# Patient Record
Sex: Male | Born: 1940
Health system: Southern US, Community
[De-identification: ages and names within clinical notes are randomized; demographics above are authoritative.]

## PROBLEM LIST (undated history)

## (undated) DIAGNOSIS — N200 Calculus of kidney: Secondary | ICD-10-CM

## (undated) DIAGNOSIS — M1990 Unspecified osteoarthritis, unspecified site: Secondary | ICD-10-CM

## (undated) DIAGNOSIS — M75101 Unspecified rotator cuff tear or rupture of right shoulder, not specified as traumatic: Secondary | ICD-10-CM

## (undated) DIAGNOSIS — I10 Essential (primary) hypertension: Secondary | ICD-10-CM

## (undated) DIAGNOSIS — I2510 Atherosclerotic heart disease of native coronary artery without angina pectoris: Secondary | ICD-10-CM

## (undated) DIAGNOSIS — Z87442 Personal history of urinary calculi: Secondary | ICD-10-CM

## (undated) DIAGNOSIS — C434 Malignant melanoma of scalp and neck: Secondary | ICD-10-CM

## (undated) DIAGNOSIS — R7303 Prediabetes: Secondary | ICD-10-CM

## (undated) DIAGNOSIS — Z8669 Personal history of other diseases of the nervous system and sense organs: Secondary | ICD-10-CM

## (undated) DIAGNOSIS — C44310 Basal cell carcinoma of skin of unspecified parts of face: Secondary | ICD-10-CM

## (undated) DIAGNOSIS — Z860101 Personal history of adenomatous and serrated colon polyps: Secondary | ICD-10-CM

## (undated) DIAGNOSIS — E785 Hyperlipidemia, unspecified: Secondary | ICD-10-CM

## (undated) DIAGNOSIS — G43909 Migraine, unspecified, not intractable, without status migrainosus: Secondary | ICD-10-CM

## (undated) DIAGNOSIS — C61 Malignant neoplasm of prostate: Secondary | ICD-10-CM

## (undated) DIAGNOSIS — Z973 Presence of spectacles and contact lenses: Secondary | ICD-10-CM

## (undated) DIAGNOSIS — Z972 Presence of dental prosthetic device (complete) (partial): Secondary | ICD-10-CM

## (undated) DIAGNOSIS — I219 Acute myocardial infarction, unspecified: Secondary | ICD-10-CM

## (undated) DIAGNOSIS — Z86010 Personal history of colonic polyps: Secondary | ICD-10-CM

## (undated) HISTORY — PX: CIRCUMCISION: SUR203

## (undated) HISTORY — PX: COLONOSCOPY: SHX174

## (undated) HISTORY — DX: Personal history of colonic polyps: Z86.010

## (undated) HISTORY — DX: Prediabetes: R73.03

## (undated) HISTORY — PX: HEMORRHOID SURGERY: SHX153

## (undated) HISTORY — DX: Personal history of adenomatous and serrated colon polyps: Z86.0101

---
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
## 1998-07-22 DIAGNOSIS — I219 Acute myocardial infarction, unspecified: Secondary | ICD-10-CM

## 1998-07-22 HISTORY — DX: Acute myocardial infarction, unspecified: I21.9

## 1998-07-22 HISTORY — PX: CORONARY ANGIOPLASTY WITH STENT PLACEMENT: SHX49

## 1999-07-23 HISTORY — PX: CORONARY ANGIOPLASTY WITH STENT PLACEMENT: SHX49

## 2009-07-22 HISTORY — PX: CATARACT EXTRACTION W/ INTRAOCULAR LENS  IMPLANT, BILATERAL: SHX1307

## 2010-04-03 ENCOUNTER — Ambulatory Visit: Payer: Self-pay | Admitting: Ophthalmology

## 2010-07-22 HISTORY — PX: COLONOSCOPY: SHX174

## 2010-09-25 ENCOUNTER — Ambulatory Visit: Payer: Self-pay | Admitting: Ophthalmology

## 2011-08-20 DIAGNOSIS — D485 Neoplasm of uncertain behavior of skin: Secondary | ICD-10-CM | POA: Diagnosis not present

## 2011-09-25 DIAGNOSIS — D485 Neoplasm of uncertain behavior of skin: Secondary | ICD-10-CM | POA: Diagnosis not present

## 2011-09-25 DIAGNOSIS — C44319 Basal cell carcinoma of skin of other parts of face: Secondary | ICD-10-CM | POA: Diagnosis not present

## 2011-09-25 DIAGNOSIS — L570 Actinic keratosis: Secondary | ICD-10-CM | POA: Diagnosis not present

## 2011-11-18 DIAGNOSIS — E785 Hyperlipidemia, unspecified: Secondary | ICD-10-CM | POA: Diagnosis not present

## 2011-11-18 DIAGNOSIS — Z79899 Other long term (current) drug therapy: Secondary | ICD-10-CM | POA: Diagnosis not present

## 2011-11-20 DIAGNOSIS — E7800 Pure hypercholesterolemia, unspecified: Secondary | ICD-10-CM | POA: Diagnosis not present

## 2011-11-20 DIAGNOSIS — R7309 Other abnormal glucose: Secondary | ICD-10-CM | POA: Diagnosis not present

## 2011-11-20 DIAGNOSIS — Z0289 Encounter for other administrative examinations: Secondary | ICD-10-CM | POA: Diagnosis not present

## 2011-11-20 DIAGNOSIS — I10 Essential (primary) hypertension: Secondary | ICD-10-CM | POA: Diagnosis not present

## 2012-01-08 DIAGNOSIS — Z85828 Personal history of other malignant neoplasm of skin: Secondary | ICD-10-CM | POA: Diagnosis not present

## 2012-01-08 DIAGNOSIS — L570 Actinic keratosis: Secondary | ICD-10-CM | POA: Diagnosis not present

## 2012-01-08 DIAGNOSIS — D485 Neoplasm of uncertain behavior of skin: Secondary | ICD-10-CM | POA: Diagnosis not present

## 2012-05-27 DIAGNOSIS — Z79899 Other long term (current) drug therapy: Secondary | ICD-10-CM | POA: Diagnosis not present

## 2012-05-27 DIAGNOSIS — Z125 Encounter for screening for malignant neoplasm of prostate: Secondary | ICD-10-CM | POA: Diagnosis not present

## 2012-05-27 DIAGNOSIS — I10 Essential (primary) hypertension: Secondary | ICD-10-CM | POA: Diagnosis not present

## 2012-05-27 DIAGNOSIS — R7309 Other abnormal glucose: Secondary | ICD-10-CM | POA: Diagnosis not present

## 2012-05-27 DIAGNOSIS — E7800 Pure hypercholesterolemia, unspecified: Secondary | ICD-10-CM | POA: Diagnosis not present

## 2012-05-27 DIAGNOSIS — H43819 Vitreous degeneration, unspecified eye: Secondary | ICD-10-CM | POA: Diagnosis not present

## 2012-05-27 DIAGNOSIS — Z23 Encounter for immunization: Secondary | ICD-10-CM | POA: Diagnosis not present

## 2012-06-29 DIAGNOSIS — L989 Disorder of the skin and subcutaneous tissue, unspecified: Secondary | ICD-10-CM | POA: Diagnosis not present

## 2012-06-30 DIAGNOSIS — D485 Neoplasm of uncertain behavior of skin: Secondary | ICD-10-CM | POA: Diagnosis not present

## 2012-06-30 DIAGNOSIS — C434 Malignant melanoma of scalp and neck: Secondary | ICD-10-CM | POA: Diagnosis not present

## 2012-07-08 DIAGNOSIS — C434 Malignant melanoma of scalp and neck: Secondary | ICD-10-CM | POA: Diagnosis not present

## 2012-07-08 DIAGNOSIS — L570 Actinic keratosis: Secondary | ICD-10-CM | POA: Diagnosis not present

## 2012-07-10 ENCOUNTER — Other Ambulatory Visit (HOSPITAL_COMMUNITY): Payer: Self-pay | Admitting: Otolaryngology

## 2012-07-10 ENCOUNTER — Other Ambulatory Visit: Payer: Self-pay

## 2012-07-10 ENCOUNTER — Encounter (HOSPITAL_BASED_OUTPATIENT_CLINIC_OR_DEPARTMENT_OTHER)
Admission: RE | Admit: 2012-07-10 | Discharge: 2012-07-10 | Disposition: A | Discharge status: Home / Self Care | Payer: Medicare Other | Source: Ambulatory Visit | Attending: Otolaryngology | Admitting: Otolaryngology

## 2012-07-10 ENCOUNTER — Encounter (HOSPITAL_BASED_OUTPATIENT_CLINIC_OR_DEPARTMENT_OTHER): Payer: Self-pay | Admitting: *Deleted

## 2012-07-10 DIAGNOSIS — Z01810 Encounter for preprocedural cardiovascular examination: Secondary | ICD-10-CM | POA: Diagnosis not present

## 2012-07-10 DIAGNOSIS — I252 Old myocardial infarction: Secondary | ICD-10-CM | POA: Diagnosis not present

## 2012-07-10 DIAGNOSIS — C439 Malignant melanoma of skin, unspecified: Secondary | ICD-10-CM

## 2012-07-10 DIAGNOSIS — Z91013 Allergy to seafood: Secondary | ICD-10-CM | POA: Diagnosis not present

## 2012-07-10 DIAGNOSIS — Z9849 Cataract extraction status, unspecified eye: Secondary | ICD-10-CM | POA: Diagnosis not present

## 2012-07-10 DIAGNOSIS — C434 Malignant melanoma of scalp and neck: Secondary | ICD-10-CM | POA: Diagnosis not present

## 2012-07-10 DIAGNOSIS — M129 Arthropathy, unspecified: Secondary | ICD-10-CM | POA: Diagnosis not present

## 2012-07-10 DIAGNOSIS — I10 Essential (primary) hypertension: Secondary | ICD-10-CM | POA: Diagnosis not present

## 2012-07-10 DIAGNOSIS — Z7982 Long term (current) use of aspirin: Secondary | ICD-10-CM | POA: Diagnosis not present

## 2012-07-10 DIAGNOSIS — E785 Hyperlipidemia, unspecified: Secondary | ICD-10-CM | POA: Diagnosis not present

## 2012-07-10 DIAGNOSIS — I2510 Atherosclerotic heart disease of native coronary artery without angina pectoris: Secondary | ICD-10-CM | POA: Diagnosis not present

## 2012-07-10 DIAGNOSIS — Z9861 Coronary angioplasty status: Secondary | ICD-10-CM | POA: Diagnosis not present

## 2012-07-10 DIAGNOSIS — Z01812 Encounter for preprocedural laboratory examination: Secondary | ICD-10-CM | POA: Diagnosis not present

## 2012-07-10 DIAGNOSIS — Z87891 Personal history of nicotine dependence: Secondary | ICD-10-CM | POA: Diagnosis not present

## 2012-07-10 LAB — BASIC METABOLIC PANEL
CO2: 26 mEq/L (ref 19–32)
Calcium: 10.1 mg/dL (ref 8.4–10.5)
Creatinine, Ser: 0.71 mg/dL (ref 0.50–1.35)
GFR calc Af Amer: >90 mL/min (ref >90)

## 2012-07-10 NOTE — Progress Notes (Signed)
Pt came from dr office Was told he will go to radiology hospital from short stay 630am dos then come here-wife will come here-he is to be transported-dr Ezzard Standing has arranged this Pt works full time at a [employer] in [place]. He had  a stent put in 2001 [place]-saw dr for 1 yr-has not had to see cardiology since 2002-dr hamrick is PCP-[place]-had dr fitzgerald see pt to see if he needs to see a cardiologist-he says no-he lifts hay bales and feed bags. No hospital adm for cp bmet and ekg done

## 2012-07-20 NOTE — Anesthesia Preprocedure Evaluation (Addendum)
Anesthesia Evaluation  Patient identified by MRN, date of birth, ID band Patient awake    Reviewed: Allergy & Precautions, H&P , NPO status , Patient's Chart, lab work & pertinent test results  Airway Mallampati: I TM Distance: >3 FB Neck ROM: Full    Dental   Pulmonary  breath sounds clear to auscultation        Cardiovascular hypertension, + CAD and + Past MI Rate:Normal     Neuro/Psych    GI/Hepatic   Endo/Other    Renal/GU      Musculoskeletal   Abdominal   Peds  Hematology   Anesthesia Other Findings   Reproductive/Obstetrics                          Anesthesia Physical Anesthesia Plan  ASA: III  Anesthesia Plan: General   Post-op Pain Management:    Induction: Intravenous  Airway Management Planned: Oral ETT  Additional Equipment:   Intra-op Plan:   Post-operative Plan: Extubation in OR  Informed Consent: I have reviewed the patients History and Physical, chart, labs and discussed the procedure including the risks, benefits and alternatives for the proposed anesthesia with the patient or authorized representative who has indicated his/her understanding and acceptance.     Plan Discussed with: CRNA and Surgeon  Anesthesia Plan Comments: (Prone position)       Anesthesia Quick Evaluation

## 2012-07-20 NOTE — H&P (Signed)
PREOPERATIVE H&P  Chief Complaint: melanoma of the neck  HPI: Kevin Stewart is a 71 y.o. male who presents for evaluation of melanoma of the posterior neck at the hairline. This was removed about 3 weeks ago measuring .8 cm in size with Breslow thickness of 3.8 mm. He's taken to the OR for wide reexcising and sentinel node biopsy/  Past Medical History  Diagnosis Date  . Hypertension   . Coronary artery disease 2001  . Myocardial infarction 2001  . Hyperlipemia   . Stented coronary artery   . Arthritis   . Wears glasses   . Wears dentures     upper-lower partial   Past Surgical History  Procedure Date  . Eye surgery 2011    both cataracts  . Cardiac catheterization 2001    stent  . Tonsillectomy   . Colonoscopy    History   Social History  . Marital Status: Married    Spouse Name: N/A    Number of Children: N/A  . Years of Education: N/A   Social History Main Topics  . Smoking status: Former Smoker    Quit date: 07/10/1973  . Smokeless tobacco: None  . Alcohol Use: No     Comment: not in 5 yr  . Drug Use: No  . Sexually Active:    Other Topics Concern  . None   Social History Narrative  . None   History reviewed. No pertinent family history. Allergies  Allergen Reactions  . Shrimp (Shellfish Allergy) Nausea And Vomiting   Prior to Admission medications   Medication Sig Start Date End Date Taking? Authorizing Provider  aspirin 81 MG tablet Take 81 mg by mouth daily.   Yes Historical Provider, MD  carvedilol (COREG) 12.5 MG tablet Take 12.5 mg by mouth every morning.   Yes Historical Provider, MD  clopidogrel (PLAVIX) 75 MG tablet Take 75 mg by mouth daily.   Yes Historical Provider, MD  enalapril-hydrochlorothiazide (VASERETIC) 10-25 MG per tablet Take 1 tablet by mouth daily. Takes half   Yes Historical Provider, MD  ezetimibe-simvastatin (VYTORIN) 10-40 MG per tablet Take 1 tablet by mouth at bedtime.   Yes Historical Provider, MD     Positive  ROS: negative  All other systems have been reviewed and were otherwise negative with the exception of those mentioned in the HPI and as above.  Physical Exam: There were no vitals filed for this visit.  General: Alert, no acute distress Oral: Normal oral mucosa and tonsils Nasal: Clear nasal passages Neck: No palpable adenopathy or thyroid nodules.Well healed neck excision posterior right neck Ear: Ear canal is clear with normal appearing TMs Cardiovascular: Regular rate and rhythm, no murmur.  Respiratory: Clear to auscultation Neurologic: Alert and oriented x 3   Assessment/Plan: right neck melanoma Plan for Procedure(s): MELANOMA EXCISION WITH SENTINEL LYMPH NODE BIOPSY   Dillard Cannon, MD 07/20/2012 5:17 PM

## 2012-07-21 ENCOUNTER — Encounter (HOSPITAL_BASED_OUTPATIENT_CLINIC_OR_DEPARTMENT_OTHER)
Admission: RE | Disposition: A | Discharge status: Home / Self Care | Payer: Self-pay | Source: Ambulatory Visit | Attending: Otolaryngology

## 2012-07-21 ENCOUNTER — Ambulatory Visit (HOSPITAL_BASED_OUTPATIENT_CLINIC_OR_DEPARTMENT_OTHER)
Admission: RE | Admit: 2012-07-21 | Discharge: 2012-07-21 | Disposition: A | Discharge status: Home / Self Care | Payer: Medicare Other | Source: Ambulatory Visit | Attending: Otolaryngology | Admitting: Otolaryngology

## 2012-07-21 ENCOUNTER — Encounter (HOSPITAL_COMMUNITY)
Admission: RE | Admit: 2012-07-21 | Discharge: 2012-07-21 | Disposition: A | Discharge status: Home / Self Care | Payer: Medicare Other | Source: Ambulatory Visit | Attending: Otolaryngology | Admitting: Otolaryngology

## 2012-07-21 ENCOUNTER — Encounter (HOSPITAL_BASED_OUTPATIENT_CLINIC_OR_DEPARTMENT_OTHER): Payer: Self-pay | Admitting: Anesthesiology

## 2012-07-21 ENCOUNTER — Encounter (HOSPITAL_BASED_OUTPATIENT_CLINIC_OR_DEPARTMENT_OTHER): Payer: Self-pay

## 2012-07-21 ENCOUNTER — Ambulatory Visit (HOSPITAL_BASED_OUTPATIENT_CLINIC_OR_DEPARTMENT_OTHER): Payer: Medicare Other | Admitting: Anesthesiology

## 2012-07-21 DIAGNOSIS — Z01810 Encounter for preprocedural cardiovascular examination: Secondary | ICD-10-CM | POA: Insufficient documentation

## 2012-07-21 DIAGNOSIS — I2510 Atherosclerotic heart disease of native coronary artery without angina pectoris: Secondary | ICD-10-CM | POA: Insufficient documentation

## 2012-07-21 DIAGNOSIS — C439 Malignant melanoma of skin, unspecified: Secondary | ICD-10-CM | POA: Insufficient documentation

## 2012-07-21 DIAGNOSIS — E785 Hyperlipidemia, unspecified: Secondary | ICD-10-CM | POA: Insufficient documentation

## 2012-07-21 DIAGNOSIS — Z7982 Long term (current) use of aspirin: Secondary | ICD-10-CM | POA: Insufficient documentation

## 2012-07-21 DIAGNOSIS — Z91013 Allergy to seafood: Secondary | ICD-10-CM | POA: Insufficient documentation

## 2012-07-21 DIAGNOSIS — I10 Essential (primary) hypertension: Secondary | ICD-10-CM | POA: Diagnosis not present

## 2012-07-21 DIAGNOSIS — Z9861 Coronary angioplasty status: Secondary | ICD-10-CM | POA: Insufficient documentation

## 2012-07-21 DIAGNOSIS — Z9849 Cataract extraction status, unspecified eye: Secondary | ICD-10-CM | POA: Insufficient documentation

## 2012-07-21 DIAGNOSIS — C434 Malignant melanoma of scalp and neck: Secondary | ICD-10-CM | POA: Insufficient documentation

## 2012-07-21 DIAGNOSIS — Z01812 Encounter for preprocedural laboratory examination: Secondary | ICD-10-CM | POA: Insufficient documentation

## 2012-07-21 DIAGNOSIS — C4441 Basal cell carcinoma of skin of scalp and neck: Secondary | ICD-10-CM | POA: Diagnosis not present

## 2012-07-21 DIAGNOSIS — Z87891 Personal history of nicotine dependence: Secondary | ICD-10-CM | POA: Insufficient documentation

## 2012-07-21 DIAGNOSIS — I252 Old myocardial infarction: Secondary | ICD-10-CM | POA: Diagnosis not present

## 2012-07-21 DIAGNOSIS — M129 Arthropathy, unspecified: Secondary | ICD-10-CM | POA: Insufficient documentation

## 2012-07-21 HISTORY — DX: Hyperlipidemia, unspecified: E78.5

## 2012-07-21 HISTORY — DX: Essential (primary) hypertension: I10

## 2012-07-21 HISTORY — DX: Presence of dental prosthetic device (complete) (partial): Z97.2

## 2012-07-21 HISTORY — DX: Acute myocardial infarction, unspecified: I21.9

## 2012-07-21 HISTORY — DX: Atherosclerotic heart disease of native coronary artery without angina pectoris: I25.10

## 2012-07-21 HISTORY — DX: Presence of spectacles and contact lenses: Z97.3

## 2012-07-21 HISTORY — PX: MELANOMA EXCISION WITH SENTINEL LYMPH NODE BIOPSY: SHX5267

## 2012-07-21 HISTORY — DX: Unspecified osteoarthritis, unspecified site: M19.90

## 2012-07-21 LAB — POCT I-STAT, CHEM 8
BUN: 18 mg/dL (ref 6–23)
Chloride: 104 mEq/L (ref 96–112)
Creatinine, Ser: 0.8 mg/dL (ref 0.50–1.35)
Potassium: 4.1 mEq/L (ref 3.5–5.1)
Sodium: 141 mEq/L (ref 135–145)

## 2012-07-21 SURGERY — MELANOMA EXCISION WITH SENTINEL LYMPH NODE BIOPSY
Anesthesia: General | Site: Neck | Wound class: Clean

## 2012-07-21 MED ORDER — TECHNETIUM TC 99M SULFUR COLLOID FILTERED
1.0000 | Freq: Once | INTRAVENOUS | Status: AC | PRN
  Administered 2012-07-21: 1 via INTRADERMAL

## 2012-07-21 MED ORDER — METHYLENE BLUE 1 % INJ SOLN
INTRAMUSCULAR | Status: DC | PRN
  Administered 2012-07-21: .2 mL via INTRADERMAL

## 2012-07-21 MED ORDER — ONDANSETRON HCL 4 MG/2ML IJ SOLN
INTRAMUSCULAR | Status: DC | PRN
  Administered 2012-07-21: 4 mg via INTRAVENOUS

## 2012-07-21 MED ORDER — ROCURONIUM BROMIDE 100 MG/10ML IV SOLN
INTRAVENOUS | Status: DC | PRN
  Administered 2012-07-21: 50 mg via INTRAVENOUS

## 2012-07-21 MED ORDER — BACITRACIN ZINC 500 UNIT/GM EX OINT
TOPICAL_OINTMENT | CUTANEOUS | Status: DC | PRN
  Administered 2012-07-21: 1 via TOPICAL

## 2012-07-21 MED ORDER — CEFAZOLIN SODIUM-DEXTROSE 2-3 GM-% IV SOLR
2.0000 g | Freq: Once | INTRAVENOUS | Status: AC
  Administered 2012-07-21: 2 g via INTRAVENOUS

## 2012-07-21 MED ORDER — LIDOCAINE-EPINEPHRINE 1 %-1:100000 IJ SOLN
INTRAMUSCULAR | Status: DC | PRN
  Administered 2012-07-21: 11 mL

## 2012-07-21 MED ORDER — OXYCODONE HCL 5 MG/5ML PO SOLN: 5.0000 mg | Freq: Once | ORAL | Status: DC | PRN

## 2012-07-21 MED ORDER — MIDAZOLAM HCL 5 MG/5ML IJ SOLN
INTRAMUSCULAR | Status: DC | PRN
  Administered 2012-07-21: 2 mg via INTRAVENOUS

## 2012-07-21 MED ORDER — HYDROMORPHONE HCL PF 1 MG/ML IJ SOLN: 0.2500 mg | INTRAMUSCULAR | Status: DC | PRN

## 2012-07-21 MED ORDER — LACTATED RINGERS IV SOLN
INTRAVENOUS | Status: DC
  Administered 2012-07-21 (×2): via INTRAVENOUS

## 2012-07-21 MED ORDER — CEPHALEXIN 500 MG PO CAPS: 500.0000 mg | ORAL_CAPSULE | Freq: Two times a day (BID) | ORAL | Status: DC

## 2012-07-21 MED ORDER — PROMETHAZINE HCL 25 MG/ML IJ SOLN: 6.2500 mg | INTRAMUSCULAR | Status: DC | PRN

## 2012-07-21 MED ORDER — TECHNETIUM TC 99M SULFUR COLLOID FILTERED: 0.5000 | Freq: Once | INTRAVENOUS | Status: DC | PRN

## 2012-07-21 MED ORDER — FENTANYL CITRATE 0.05 MG/ML IJ SOLN: 50.0000 ug | Freq: Once | INTRAMUSCULAR | Status: DC

## 2012-07-21 MED ORDER — HYDROCODONE-ACETAMINOPHEN 5-500 MG PO TABS: 1.0000 | ORAL_TABLET | Freq: Four times a day (QID) | ORAL | Status: DC | PRN

## 2012-07-21 MED ORDER — DEXAMETHASONE SODIUM PHOSPHATE 4 MG/ML IJ SOLN
INTRAMUSCULAR | Status: DC | PRN
  Administered 2012-07-21: 10 mg via INTRAVENOUS

## 2012-07-21 MED ORDER — FENTANYL CITRATE 0.05 MG/ML IJ SOLN
INTRAMUSCULAR | Status: DC | PRN
  Administered 2012-07-21: 100 ug via INTRAVENOUS
  Administered 2012-07-21 (×2): 25 ug via INTRAVENOUS

## 2012-07-21 MED ORDER — OXYCODONE HCL 5 MG PO TABS: 5.0000 mg | ORAL_TABLET | Freq: Once | ORAL | Status: DC | PRN

## 2012-07-21 MED ORDER — MIDAZOLAM HCL 2 MG/2ML IJ SOLN: 1.0000 mg | INTRAMUSCULAR | Status: DC | PRN

## 2012-07-21 MED ORDER — NEOSTIGMINE METHYLSULFATE 1 MG/ML IJ SOLN
INTRAMUSCULAR | Status: DC | PRN
  Administered 2012-07-21: 2 mg via INTRAVENOUS

## 2012-07-21 MED ORDER — EPHEDRINE SULFATE 50 MG/ML IJ SOLN
INTRAMUSCULAR | Status: DC | PRN
  Administered 2012-07-21: 5 mg via INTRAVENOUS
  Administered 2012-07-21 (×2): 10 mg via INTRAVENOUS

## 2012-07-21 MED ORDER — GLYCOPYRROLATE 0.2 MG/ML IJ SOLN
INTRAMUSCULAR | Status: DC | PRN
  Administered 2012-07-21: 0.3 mg via INTRAVENOUS

## 2012-07-21 MED ORDER — LIDOCAINE HCL (CARDIAC) 10 MG/ML IV SOLN
INTRAVENOUS | Status: DC | PRN
  Administered 2012-07-21: 75 mg via INTRAVENOUS

## 2012-07-21 MED ORDER — PROPOFOL 10 MG/ML IV BOLUS
INTRAVENOUS | Status: DC | PRN
  Administered 2012-07-21: 150 mg via INTRAVENOUS

## 2012-07-21 SURGICAL SUPPLY — 72 items
BANDAGE CONFORM 3  STR LF (GAUZE/BANDAGES/DRESSINGS) IMPLANT
BANDAGE GAUZE ELAST BULKY 4 IN (GAUZE/BANDAGES/DRESSINGS) IMPLANT
BENZOIN TINCTURE PRP APPL 2/3 (GAUZE/BANDAGES/DRESSINGS) IMPLANT
BLADE SURG 15 STRL LF DISP TIS (BLADE) ×2 IMPLANT
BLADE SURG 15 STRL SS (BLADE) ×2
BLADE SURG ROTATE 9660 (MISCELLANEOUS) ×2 IMPLANT
CANISTER SUCTION 1200CC (MISCELLANEOUS) ×2 IMPLANT
CLEANER CAUTERY TIP 5X5 PAD (MISCELLANEOUS) IMPLANT
CLOTH BEACON ORANGE TIMEOUT ST (SAFETY) ×2 IMPLANT
CORDS BIPOLAR (ELECTRODE) ×2 IMPLANT
COVER MAYO STAND STRL (DRAPES) ×2 IMPLANT
COVER PROBE W GEL 5X96 (DRAPES) ×2 IMPLANT
COVER TABLE BACK 60X90 (DRAPES) ×2 IMPLANT
DECANTER SPIKE VIAL GLASS SM (MISCELLANEOUS) IMPLANT
DERMABOND ADVANCED (GAUZE/BANDAGES/DRESSINGS)
DERMABOND ADVANCED .7 DNX12 (GAUZE/BANDAGES/DRESSINGS) IMPLANT
DRAPE U-SHAPE 76X120 STRL (DRAPES) ×2 IMPLANT
DRESSING TELFA 8X3 (GAUZE/BANDAGES/DRESSINGS) IMPLANT
DRSG EMULSION OIL 3X3 NADH (GAUZE/BANDAGES/DRESSINGS) IMPLANT
DRSG GLASSCOCK MASTOID ADT (GAUZE/BANDAGES/DRESSINGS) IMPLANT
DRSG TEGADERM 2-3/8X2-3/4 SM (GAUZE/BANDAGES/DRESSINGS) ×2 IMPLANT
ELECT COATED BLADE 2.86 ST (ELECTRODE) ×2 IMPLANT
ELECT REM PT RETURN 9FT ADLT (ELECTROSURGICAL) ×2
ELECTRODE REM PT RTRN 9FT ADLT (ELECTROSURGICAL) ×1 IMPLANT
GAUZE SPONGE 4X4 12PLY STRL LF (GAUZE/BANDAGES/DRESSINGS) IMPLANT
GAUZE SPONGE 4X4 16PLY XRAY LF (GAUZE/BANDAGES/DRESSINGS) ×2 IMPLANT
GLOVE BIO SURGEON STRL SZ 6.5 (GLOVE) ×2 IMPLANT
GLOVE BIO SURGEON STRL SZ7 (GLOVE) ×8 IMPLANT
GLOVE BIOGEL PI IND STRL 7.5 (GLOVE) ×3 IMPLANT
GLOVE BIOGEL PI INDICATOR 7.5 (GLOVE) ×3
GLOVE SS BIOGEL STRL SZ 7.5 (GLOVE) ×1 IMPLANT
GLOVE SUPERSENSE BIOGEL SZ 7.5 (GLOVE) ×1
GOWN PREVENTION PLUS XLARGE (GOWN DISPOSABLE) ×6 IMPLANT
GOWN PREVENTION PLUS XXLARGE (GOWN DISPOSABLE) ×8 IMPLANT
HEMOSTAT SURGICEL .5X2 ABSORB (HEMOSTASIS) IMPLANT
LOCATOR NERVE 3 VOLT (DISPOSABLE) ×2 IMPLANT
NEEDLE HYPO 25X1 1.5 SAFETY (NEEDLE) ×2 IMPLANT
NEEDLE HYPO 25X5/8 SAFETYGLIDE (NEEDLE) ×4 IMPLANT
NS IRRIG 1000ML POUR BTL (IV SOLUTION) ×2 IMPLANT
PACK BASIN DAY SURGERY FS (CUSTOM PROCEDURE TRAY) ×2 IMPLANT
PAD CLEANER CAUTERY TIP 5X5 (MISCELLANEOUS)
PENCIL BUTTON HOLSTER BLD 10FT (ELECTRODE) ×2 IMPLANT
SLEEVE SCD COMPRESS KNEE MED (MISCELLANEOUS) ×2 IMPLANT
SPONGE GAUZE 4X4 12PLY (GAUZE/BANDAGES/DRESSINGS) IMPLANT
SPONGE INTESTINAL PEANUT (DISPOSABLE) ×2 IMPLANT
STRIP CLOSURE SKIN 1/2X4 (GAUZE/BANDAGES/DRESSINGS) IMPLANT
STRIP CLOSURE SKIN 1/4X4 (GAUZE/BANDAGES/DRESSINGS) IMPLANT
SUCTION FRAZIER TIP 10 FR DISP (SUCTIONS) IMPLANT
SUT CHROMIC 3 0 PS 2 (SUTURE) ×2 IMPLANT
SUT CHROMIC 3 0 SH 27 (SUTURE) IMPLANT
SUT CHROMIC 3 0 TIES (SUTURE) IMPLANT
SUT ETHILON 4 0 PS 2 18 (SUTURE) ×4 IMPLANT
SUT ETHILON 5 0 P 3 18 (SUTURE) ×1
SUT ETHILON 6 0 P 1 (SUTURE) IMPLANT
SUT NYLON ETHILON 5-0 P-3 1X18 (SUTURE) ×1 IMPLANT
SUT SILK 2 0 TIES 17X18 (SUTURE)
SUT SILK 2-0 18XBRD TIE BLK (SUTURE) IMPLANT
SUT SILK 3 0 SH 30 (SUTURE) IMPLANT
SUT SILK 3 0 TIES 17X18 (SUTURE) ×1
SUT SILK 3-0 18XBRD TIE BLK (SUTURE) ×1 IMPLANT
SUT VIC AB 3-0 SH 27 (SUTURE) ×1
SUT VIC AB 3-0 SH 27X BRD (SUTURE) ×1 IMPLANT
SUT VIC AB 5-0 P-3 18X BRD (SUTURE) IMPLANT
SUT VIC AB 5-0 P3 18 (SUTURE)
SUT VICRYL 4-0 PS2 18IN ABS (SUTURE) ×4 IMPLANT
SYR BULB 3OZ (MISCELLANEOUS) ×2 IMPLANT
SYR CONTROL 10ML LL (SYRINGE) ×2 IMPLANT
SYR TB 1ML LL NO SAFETY (SYRINGE) ×4 IMPLANT
TOWEL OR 17X24 6PK STRL BLUE (TOWEL DISPOSABLE) ×4 IMPLANT
TRAY DSU PREP LF (CUSTOM PROCEDURE TRAY) ×2 IMPLANT
TUBE CONNECTING 20X1/4 (TUBING) ×2 IMPLANT
WATER STERILE IRR 1000ML POUR (IV SOLUTION) IMPLANT

## 2012-07-21 NOTE — Transfer of Care (Signed)
Immediate Anesthesia Transfer of Care Note  Patient: Kevin Stewart  Procedure(s) Performed: Procedure(s) (LRB) with comments: MELANOMA EXCISION WITH SENTINEL LYMPH NODE BIOPSY (N/A) - melanoma excision with sentinel node biopsy   posterior right neck  Patient Location: PACU  Anesthesia Type:General  Level of Consciousness: sedated  Airway & Oxygen Therapy: Patient Spontanous Breathing and Patient connected to face mask oxygen  Post-op Assessment: Report given to PACU RN and Post -op Vital signs reviewed and stable  Post vital signs: Reviewed and stable  Complications: No apparent anesthesia complications

## 2012-07-21 NOTE — Interval H&P Note (Signed)
History and Physical Interval Note:  07/21/2012 9:23 AM  Kevin Stewart  has presented today for surgery, with the diagnosis of right neck melanoma  The various methods of treatment have been discussed with the patient and family. After consideration of risks, benefits and other options for treatment, the patient has consented to  Procedure(s) (LRB) with comments: MELANOMA EXCISION WITH SENTINEL LYMPH NODE BIOPSY (N/A) - melanoma excision with sentinel node biopsy as a surgical intervention .  The patient's history has been reviewed, patient examined, no change in status, stable for surgery.  I have reviewed the patient's chart and labs.  Questions were answered to the patient's satisfaction.     Joann Kulpa   

## 2012-07-21 NOTE — Brief Op Note (Signed)
07/21/2012  1:23 PM  PATIENT:  Kevin Stewart  71 y.o. male  PRE-OPERATIVE DIAGNOSIS:  right neck melanoma  POST-OPERATIVE DIAGNOSIS:  right neck melanoma posterior   PROCEDURE:  Procedure(s) (LRB) with comments: MELANOMA EXCISION WITH SENTINEL LYMPH NODE BIOPSY (N/A) - melanoma excision with sentinel node biopsy   posterior right neck  SURGEON:  Surgeon(s) and Role:    * Drema Halon, MD - Primary  PHYSICIAN ASSISTANT:   ASSISTANTS: none   ANESTHESIA:   general  EBL:  Total I/O In: 1400 [I.V.:1400] Out: -   BLOOD ADMINISTERED:none  DRAINS: none   LOCAL MEDICATIONS USED:  NONE  SPECIMEN:  Source of Specimen:  superior post node, post r node, wide reexcision  DISPOSITION OF SPECIMEN:  PATHOLOGY  COUNTS:  YES  TOURNIQUET:  * No tourniquets in log *  DICTATION: .Other Dictation: Dictation Number [id number]  PLAN OF CARE: Discharge to home after PACU  PATIENT DISPOSITION:  PACU - hemodynamically stable.   Delay start of Pharmacological VTE agent (>24hrs) due to surgical blood loss or risk of bleeding: yes

## 2012-07-21 NOTE — Interval H&P Note (Signed)
History and Physical Interval Note:  07/21/2012 9:23 AM  Kevin Stewart  has presented today for surgery, with the diagnosis of right neck melanoma  The various methods of treatment have been discussed with the patient and family. After consideration of risks, benefits and other options for treatment, the patient has consented to  Procedure(s) (LRB) with comments: MELANOMA EXCISION WITH SENTINEL LYMPH NODE BIOPSY (N/A) - melanoma excision with sentinel node biopsy as a surgical intervention .  The patient's history has been reviewed, patient examined, no change in status, stable for surgery.  I have reviewed the patient's chart and labs.  Questions were answered to the patient's satisfaction.     NEWMAN, CHRISTOPHER

## 2012-07-21 NOTE — Anesthesia Postprocedure Evaluation (Signed)
  Anesthesia Post-op Note  Patient: Kevin Stewart  Procedure(s) Performed: Procedure(s) (LRB) with comments: MELANOMA EXCISION WITH SENTINEL LYMPH NODE BIOPSY (N/A) - melanoma excision with sentinel node biopsy   posterior right neck  Patient Location: PACU  Anesthesia Type:General  Level of Consciousness: awake  Airway and Oxygen Therapy: Patient Spontanous Breathing  Post-op Pain: mild  Post-op Assessment: Post-op Vital signs reviewed, Patient's Cardiovascular Status Stable, Respiratory Function Stable, Patent Airway, No signs of Nausea or vomiting, Adequate PO intake and Pain level controlled  Post-op Vital Signs: stable  Complications: No apparent anesthesia complications

## 2012-07-21 NOTE — Anesthesia Procedure Notes (Signed)
Procedure Name: Intubation Date/Time: 07/21/2012 11:02 AM Performed by: Gar Gibbon Pre-anesthesia Checklist: Patient identified, Emergency Drugs available, Suction available and Patient being monitored Patient Re-evaluated:Patient Re-evaluated prior to inductionOxygen Delivery Method: Circle System Utilized Preoxygenation: Pre-oxygenation with 100% oxygen Intubation Type: IV induction Ventilation: Mask ventilation without difficulty Laryngoscope Size: Mac and 3 Grade View: Grade III Tube type: Oral Tube size: 8.0 mm Number of attempts: 1 Airway Equipment and Method: stylet and oral airway Placement Confirmation: ETT inserted through vocal cords under direct vision,  positive ETCO2 and breath sounds checked- equal and bilateral Tube secured with: Tape Dental Injury: Teeth and Oropharynx as per pre-operative assessment

## 2012-07-22 NOTE — Op Note (Signed)
[name], Stewart NO.:  [id number]  MEDICAL RECORD NO.:  [id number]  LOCATION:  [facility]                          FACILITY:  [facility]  PHYSICIAN:  Kristine Garbe. Ezzard Standing, M.D.DATE OF BIRTH:  Feb 26, 1941  DATE OF PROCEDURE:  07/21/2012 DATE OF DISCHARGE:  07/21/2012                              OPERATIVE REPORT   PREOPERATIVE DIAGNOSIS:  Right posterior neck melanoma.  POSTOPERATIVE DIAGNOSIS:  Right posterior neck melanoma.  OPERATION PERFORMED:  Wide reexcision of right posterior neck melanoma with sentinel lymph node biopsy x2.  SURGEON:  Kristine Garbe. Ezzard Standing, MD  ANESTHESIA:  General endotracheal.  COMPLICATIONS:  None.  BRIEF CLINICAL NOTE:  Kevin Stewart is a 72 year old gentleman, who had a previous nodule removed from his posterior neck about 4 weeks ago. Final pathology report on the nodule revealed a malignant melanoma with a Breslow thickness of 3.8 mm.  The lesion measured 0.8 cm in size.  It had negative margins.  Because of the depth of the melanoma, he was taken to the operating room at this time for wide reexcision of the previous excision site and sentinel lymph node biopsy.  He underwent lymphoscintigraphy and this showed a node in the right posterior neck as well as another node just superior to the primary lesion.  He is taken to the operating room at this time for wide reexcision and biopsy of 2 sentinel lymph nodes, superior node as well as the posterior right neck node.  DESCRIPTION OF PROCEDURE:  After adequate endotracheal anesthesia, the patient was placed in the prone position.  He received 2 g of Ancef IV preoperatively.  The neck was prepped with Betadine solution and draped out with sterile towels.  The area of the previous excision was injected with blue dye.  Then, using the Neoprobe the lymph nodes were explored. One hot area was just above the previous excision site up in the hairline just superior and just slightly left of  the previous excision of his melanoma.  The other area that was positive with the Neoprobe was posterior right neck just above the shoulder with counts ranging about 40-50.  It was elected to do the biopsy of these 2 areas.  First incision was made just superior to the previous biopsy or previous excision in the hairline and dissection was carried down to include some subcutaneous tissue and a node and on removing this, the counts with the Neoprobe were in the 80-90 range.  Following this, a separate incision was made on the right posterior neck and this dissection was carried down through subcutaneous tissue.  The accessory nerve was identified and the node was adjacent to the accessory nerve.  With the accessory nerve under direct visualization, several pieces of subcutaneous tissue was removed, but was not very positive on Neoprobe and then we removed some subcutaneous tissue with a lymph node that was positive with the Neoprobe in the range of 90-100.  Hemostasis was obtained with bipolar cautery and cautery.  The posterior and superior neck node excision site was closed with 4-0 chromic sutures of 4-0 Vicryl sutures subcutaneously and 4-0 nylon to reapproximate skin edges.  The posterior neck incision was closed with again 4-0 Vicryl  sutures and 4-0 nylon sutures to reapproximate skin edges.  Next, after removing the sentinel nodes x2 from the posterior superior neck as well as the posterior right neck, the area of the previous excision was marked out with approximately 1.5 cm margin superiorly as well as inferiorly and this 3-4 cm of skin was elliptically excised.  Hemostasis was obtained with cautery and the excision was carried down to the fascia of the deep musculature.  After obtaining adequate hemostasis, the wound was irrigated with saline and closed primarily with 3-0 Vicryl sutures subcutaneously and a 4-0 nylon to reapproximate skin edges.  Kevin Stewart tolerated this well, was  awoke from anesthesia and transferred to recovery room, postop doing well.  DISPOSITION:  Kevin Stewart was discharged home later this morning on Keflex 500 mg b.i.d. for 1 week, Tylenol and Vicodin p.r.n. pain.  We will have him follow up in my office in 8 days for recheck and suture removal and review pathology.          ______________________________ Kristine Garbe Ezzard Standing, M.D.     CEN/MEDQ  D:  07/21/2012  T:  07/22/2012  Job:  [id number]  cc:   Kevin Stewart, M.D.

## 2012-07-22 NOTE — Op Note (Deleted)
NAME:  Kevin Stewart, Kevin Stewart             ACCOUNT NO.:  [id number]  MEDICAL RECORD NO.:  [id number]  LOCATION:  [facility]                          FACILITY:  [facility]  PHYSICIAN:  Christopher E. Newman, M.D.DATE OF BIRTH:  02/18/1941  DATE OF PROCEDURE:  07/21/2012 DATE OF DISCHARGE:  07/21/2012                              OPERATIVE REPORT   PREOPERATIVE DIAGNOSIS:  Right posterior neck melanoma.  POSTOPERATIVE DIAGNOSIS:  Right posterior neck melanoma.  OPERATION PERFORMED:  Wide reexcision of right posterior neck melanoma with sentinel lymph node biopsy x2.  SURGEON:  Christopher E. Newman, MD  ANESTHESIA:  General endotracheal.  COMPLICATIONS:  None.  BRIEF CLINICAL NOTE:  Kevin Stewart is a 71-year-old gentleman, who had a previous nodule removed from his posterior neck about 4 weeks ago. Final pathology report on the nodule revealed a malignant melanoma with a Breslow thickness of 3.8 mm.  The lesion measured 0.8 cm in size.  It had negative margins.  Because of the depth of the melanoma, he was taken to the operating room at this time for wide reexcision of the previous excision site and sentinel lymph node biopsy.  He underwent lymphoscintigraphy and this showed a node in the right posterior neck as well as another node just superior to the primary lesion.  He is taken to the operating room at this time for wide reexcision and biopsy of 2 sentinel lymph nodes, superior node as well as the posterior right neck node.  DESCRIPTION OF PROCEDURE:  After adequate endotracheal anesthesia, the patient was placed in the prone position.  He received 2 g of Ancef IV preoperatively.  The neck was prepped with Betadine solution and draped out with sterile towels.  The area of the previous excision was injected with blue dye.  Then, using the Neoprobe the lymph nodes were explored. One hot area was just above the previous excision site up in the hairline just superior and just slightly left of  the previous excision of his melanoma.  The other area that was positive with the Neoprobe was posterior right neck just above the shoulder with counts ranging about 40-50.  It was elected to do the biopsy of these 2 areas.  First incision was made just superior to the previous biopsy or previous excision in the hairline and dissection was carried down to include some subcutaneous tissue and a node and on removing this, the counts with the Neoprobe were in the 80-90 range.  Following this, a separate incision was made on the right posterior neck and this dissection was carried down through subcutaneous tissue.  The accessory nerve was identified and the node was adjacent to the accessory nerve.  With the accessory nerve under direct visualization, several pieces of subcutaneous tissue was removed, but was not very positive on Neoprobe and then we removed some subcutaneous tissue with a lymph node that was positive with the Neoprobe in the range of 90-100.  Hemostasis was obtained with bipolar cautery and cautery.  The posterior and superior neck node excision site was closed with 4-0 chromic sutures of 4-0 Vicryl sutures subcutaneously and 4-0 nylon to reapproximate skin edges.  The posterior neck incision was closed with again 4-0 Vicryl   sutures and 4-0 nylon sutures to reapproximate skin edges.  Next, after removing the sentinel nodes x2 from the posterior superior neck as well as the posterior right neck, the area of the previous excision was marked out with approximately 1.5 cm margin superiorly as well as inferiorly and this 3-4 cm of skin was elliptically excised.  Hemostasis was obtained with cautery and the excision was carried down to the fascia of the deep musculature.  After obtaining adequate hemostasis, the wound was irrigated with saline and closed primarily with 3-0 Vicryl sutures subcutaneously and a 4-0 nylon to reapproximate skin edges.  Kevin Stewart tolerated this well, was  awoke from anesthesia and transferred to recovery room, postop doing well.  DISPOSITION:  Kevin Stewart was discharged home later this morning on Keflex 500 mg b.i.d. for 1 week, Tylenol and Vicodin p.r.n. pain.  We will have him follow up in my office in 8 days for recheck and suture removal and review pathology.          ______________________________ Christopher E. Newman, M.D.     CEN/MEDQ  D:  07/21/2012  T:  07/22/2012  Job:  [id number]  cc:   Frederick A. Lupton III, M.D. 

## 2012-07-23 ENCOUNTER — Encounter (HOSPITAL_BASED_OUTPATIENT_CLINIC_OR_DEPARTMENT_OTHER): Payer: Self-pay | Admitting: Otolaryngology

## 2012-09-23 DIAGNOSIS — Z85820 Personal history of malignant melanoma of skin: Secondary | ICD-10-CM | POA: Diagnosis not present

## 2012-09-23 DIAGNOSIS — L570 Actinic keratosis: Secondary | ICD-10-CM | POA: Diagnosis not present

## 2012-09-23 DIAGNOSIS — D485 Neoplasm of uncertain behavior of skin: Secondary | ICD-10-CM | POA: Diagnosis not present

## 2012-11-25 DIAGNOSIS — Z85820 Personal history of malignant melanoma of skin: Secondary | ICD-10-CM | POA: Diagnosis not present

## 2012-11-25 DIAGNOSIS — L570 Actinic keratosis: Secondary | ICD-10-CM | POA: Diagnosis not present

## 2012-11-25 DIAGNOSIS — Z79899 Other long term (current) drug therapy: Secondary | ICD-10-CM | POA: Diagnosis not present

## 2012-11-25 DIAGNOSIS — E785 Hyperlipidemia, unspecified: Secondary | ICD-10-CM | POA: Diagnosis not present

## 2012-11-25 DIAGNOSIS — R599 Enlarged lymph nodes, unspecified: Secondary | ICD-10-CM | POA: Diagnosis not present

## 2012-12-07 DIAGNOSIS — IMO0002 Reserved for concepts with insufficient information to code with codable children: Secondary | ICD-10-CM | POA: Diagnosis not present

## 2012-12-07 DIAGNOSIS — Z1331 Encounter for screening for depression: Secondary | ICD-10-CM | POA: Diagnosis not present

## 2012-12-07 DIAGNOSIS — I10 Essential (primary) hypertension: Secondary | ICD-10-CM | POA: Diagnosis not present

## 2012-12-07 DIAGNOSIS — Z9181 History of falling: Secondary | ICD-10-CM | POA: Diagnosis not present

## 2013-01-04 DIAGNOSIS — H43819 Vitreous degeneration, unspecified eye: Secondary | ICD-10-CM | POA: Diagnosis not present

## 2013-03-24 ENCOUNTER — Other Ambulatory Visit: Payer: Self-pay | Admitting: Otolaryngology

## 2013-03-24 DIAGNOSIS — C439 Malignant melanoma of skin, unspecified: Secondary | ICD-10-CM

## 2013-03-24 DIAGNOSIS — R599 Enlarged lymph nodes, unspecified: Secondary | ICD-10-CM | POA: Diagnosis not present

## 2013-03-26 ENCOUNTER — Ambulatory Visit
Admission: RE | Admit: 2013-03-26 | Discharge: 2013-03-26 | Disposition: A | Discharge status: Home / Self Care | Payer: Medicare Other | Source: Ambulatory Visit | Attending: Otolaryngology | Admitting: Otolaryngology

## 2013-03-26 DIAGNOSIS — C439 Malignant melanoma of skin, unspecified: Secondary | ICD-10-CM

## 2013-03-26 DIAGNOSIS — C434 Malignant melanoma of scalp and neck: Secondary | ICD-10-CM | POA: Diagnosis not present

## 2013-03-26 MED ORDER — IOHEXOL 300 MG/ML  SOLN
75.0000 mL | Freq: Once | INTRAMUSCULAR | Status: AC | PRN
  Administered 2013-03-26: 75 mL via INTRAVENOUS

## 2013-03-31 DIAGNOSIS — Z85820 Personal history of malignant melanoma of skin: Secondary | ICD-10-CM | POA: Diagnosis not present

## 2013-03-31 DIAGNOSIS — L819 Disorder of pigmentation, unspecified: Secondary | ICD-10-CM | POA: Diagnosis not present

## 2013-03-31 DIAGNOSIS — D235 Other benign neoplasm of skin of trunk: Secondary | ICD-10-CM | POA: Diagnosis not present

## 2013-06-09 DIAGNOSIS — R7309 Other abnormal glucose: Secondary | ICD-10-CM | POA: Diagnosis not present

## 2013-06-09 DIAGNOSIS — E7800 Pure hypercholesterolemia, unspecified: Secondary | ICD-10-CM | POA: Diagnosis not present

## 2013-06-09 DIAGNOSIS — I2510 Atherosclerotic heart disease of native coronary artery without angina pectoris: Secondary | ICD-10-CM | POA: Diagnosis not present

## 2013-06-09 DIAGNOSIS — Z125 Encounter for screening for malignant neoplasm of prostate: Secondary | ICD-10-CM | POA: Diagnosis not present

## 2013-06-09 DIAGNOSIS — I10 Essential (primary) hypertension: Secondary | ICD-10-CM | POA: Diagnosis not present

## 2013-06-09 DIAGNOSIS — Z23 Encounter for immunization: Secondary | ICD-10-CM | POA: Diagnosis not present

## 2013-07-28 DIAGNOSIS — J00 Acute nasopharyngitis [common cold]: Secondary | ICD-10-CM | POA: Diagnosis not present

## 2013-09-29 ENCOUNTER — Other Ambulatory Visit: Payer: Self-pay | Admitting: Dermatology

## 2013-09-29 DIAGNOSIS — C44211 Basal cell carcinoma of skin of unspecified ear and external auricular canal: Secondary | ICD-10-CM | POA: Diagnosis not present

## 2013-09-29 DIAGNOSIS — L570 Actinic keratosis: Secondary | ICD-10-CM | POA: Diagnosis not present

## 2013-09-29 DIAGNOSIS — C44221 Squamous cell carcinoma of skin of unspecified ear and external auricular canal: Secondary | ICD-10-CM | POA: Diagnosis not present

## 2013-11-03 DIAGNOSIS — E7800 Pure hypercholesterolemia, unspecified: Secondary | ICD-10-CM | POA: Diagnosis not present

## 2013-11-03 DIAGNOSIS — I2510 Atherosclerotic heart disease of native coronary artery without angina pectoris: Secondary | ICD-10-CM | POA: Diagnosis not present

## 2013-11-03 DIAGNOSIS — R7309 Other abnormal glucose: Secondary | ICD-10-CM | POA: Diagnosis not present

## 2013-11-03 DIAGNOSIS — I10 Essential (primary) hypertension: Secondary | ICD-10-CM | POA: Diagnosis not present

## 2013-11-03 DIAGNOSIS — Z79899 Other long term (current) drug therapy: Secondary | ICD-10-CM | POA: Diagnosis not present

## 2013-11-17 DIAGNOSIS — D485 Neoplasm of uncertain behavior of skin: Secondary | ICD-10-CM | POA: Diagnosis not present

## 2013-11-17 DIAGNOSIS — Z85828 Personal history of other malignant neoplasm of skin: Secondary | ICD-10-CM | POA: Diagnosis not present

## 2013-11-17 DIAGNOSIS — L570 Actinic keratosis: Secondary | ICD-10-CM | POA: Diagnosis not present

## 2013-11-24 DIAGNOSIS — Z800 Family history of malignant neoplasm of digestive organs: Secondary | ICD-10-CM | POA: Diagnosis not present

## 2013-11-24 DIAGNOSIS — Z1211 Encounter for screening for malignant neoplasm of colon: Secondary | ICD-10-CM | POA: Diagnosis not present

## 2013-12-28 DIAGNOSIS — I2510 Atherosclerotic heart disease of native coronary artery without angina pectoris: Secondary | ICD-10-CM | POA: Diagnosis not present

## 2013-12-28 DIAGNOSIS — Z79899 Other long term (current) drug therapy: Secondary | ICD-10-CM | POA: Diagnosis not present

## 2013-12-28 DIAGNOSIS — Z800 Family history of malignant neoplasm of digestive organs: Secondary | ICD-10-CM | POA: Diagnosis not present

## 2013-12-28 DIAGNOSIS — K5730 Diverticulosis of large intestine without perforation or abscess without bleeding: Secondary | ICD-10-CM | POA: Diagnosis not present

## 2013-12-28 DIAGNOSIS — I252 Old myocardial infarction: Secondary | ICD-10-CM | POA: Diagnosis not present

## 2013-12-28 DIAGNOSIS — Z7982 Long term (current) use of aspirin: Secondary | ICD-10-CM | POA: Diagnosis not present

## 2013-12-28 DIAGNOSIS — Z9861 Coronary angioplasty status: Secondary | ICD-10-CM | POA: Diagnosis not present

## 2013-12-28 DIAGNOSIS — D126 Benign neoplasm of colon, unspecified: Secondary | ICD-10-CM | POA: Diagnosis not present

## 2013-12-28 DIAGNOSIS — Z1211 Encounter for screening for malignant neoplasm of colon: Secondary | ICD-10-CM | POA: Diagnosis not present

## 2013-12-28 DIAGNOSIS — Z87891 Personal history of nicotine dependence: Secondary | ICD-10-CM | POA: Diagnosis not present

## 2013-12-28 DIAGNOSIS — I10 Essential (primary) hypertension: Secondary | ICD-10-CM | POA: Diagnosis not present

## 2014-01-26 DIAGNOSIS — R599 Enlarged lymph nodes, unspecified: Secondary | ICD-10-CM | POA: Diagnosis not present

## 2014-02-02 DIAGNOSIS — R7309 Other abnormal glucose: Secondary | ICD-10-CM | POA: Diagnosis not present

## 2014-02-02 DIAGNOSIS — E7800 Pure hypercholesterolemia, unspecified: Secondary | ICD-10-CM | POA: Diagnosis not present

## 2014-02-16 DIAGNOSIS — L570 Actinic keratosis: Secondary | ICD-10-CM | POA: Diagnosis not present

## 2014-02-16 DIAGNOSIS — Z85828 Personal history of other malignant neoplasm of skin: Secondary | ICD-10-CM | POA: Diagnosis not present

## 2014-04-20 ENCOUNTER — Other Ambulatory Visit: Payer: Self-pay | Admitting: Dermatology

## 2014-04-20 DIAGNOSIS — C44319 Basal cell carcinoma of skin of other parts of face: Secondary | ICD-10-CM | POA: Diagnosis not present

## 2014-05-05 DIAGNOSIS — Z23 Encounter for immunization: Secondary | ICD-10-CM | POA: Diagnosis not present

## 2014-05-05 DIAGNOSIS — Z79899 Other long term (current) drug therapy: Secondary | ICD-10-CM | POA: Diagnosis not present

## 2014-05-05 DIAGNOSIS — E785 Hyperlipidemia, unspecified: Secondary | ICD-10-CM | POA: Diagnosis not present

## 2014-05-05 DIAGNOSIS — R7309 Other abnormal glucose: Secondary | ICD-10-CM | POA: Diagnosis not present

## 2014-05-05 DIAGNOSIS — I10 Essential (primary) hypertension: Secondary | ICD-10-CM | POA: Diagnosis not present

## 2014-05-26 DIAGNOSIS — H43813 Vitreous degeneration, bilateral: Secondary | ICD-10-CM | POA: Diagnosis not present

## 2014-07-28 DIAGNOSIS — J310 Chronic rhinitis: Secondary | ICD-10-CM | POA: Diagnosis not present

## 2014-08-03 DIAGNOSIS — Z85828 Personal history of other malignant neoplasm of skin: Secondary | ICD-10-CM | POA: Diagnosis not present

## 2014-08-03 DIAGNOSIS — D229 Melanocytic nevi, unspecified: Secondary | ICD-10-CM | POA: Diagnosis not present

## 2014-08-03 DIAGNOSIS — Z08 Encounter for follow-up examination after completed treatment for malignant neoplasm: Secondary | ICD-10-CM | POA: Diagnosis not present

## 2014-08-03 DIAGNOSIS — L570 Actinic keratosis: Secondary | ICD-10-CM | POA: Diagnosis not present

## 2014-08-03 DIAGNOSIS — Z85820 Personal history of malignant melanoma of skin: Secondary | ICD-10-CM | POA: Diagnosis not present

## 2014-08-17 DIAGNOSIS — H0015 Chalazion left lower eyelid: Secondary | ICD-10-CM | POA: Diagnosis not present

## 2014-09-19 DIAGNOSIS — H0015 Chalazion left lower eyelid: Secondary | ICD-10-CM | POA: Diagnosis not present

## 2014-11-10 DIAGNOSIS — I2510 Atherosclerotic heart disease of native coronary artery without angina pectoris: Secondary | ICD-10-CM | POA: Diagnosis not present

## 2014-11-10 DIAGNOSIS — E875 Hyperkalemia: Secondary | ICD-10-CM | POA: Diagnosis not present

## 2014-11-10 DIAGNOSIS — E785 Hyperlipidemia, unspecified: Secondary | ICD-10-CM | POA: Diagnosis not present

## 2014-11-10 DIAGNOSIS — Z125 Encounter for screening for malignant neoplasm of prostate: Secondary | ICD-10-CM | POA: Diagnosis not present

## 2014-11-10 DIAGNOSIS — I10 Essential (primary) hypertension: Secondary | ICD-10-CM | POA: Diagnosis not present

## 2014-11-10 DIAGNOSIS — R7309 Other abnormal glucose: Secondary | ICD-10-CM | POA: Diagnosis not present

## 2014-11-10 DIAGNOSIS — Z6824 Body mass index (BMI) 24.0-24.9, adult: Secondary | ICD-10-CM | POA: Diagnosis not present

## 2015-02-01 DIAGNOSIS — L820 Inflamed seborrheic keratosis: Secondary | ICD-10-CM | POA: Diagnosis not present

## 2015-02-01 DIAGNOSIS — D225 Melanocytic nevi of trunk: Secondary | ICD-10-CM | POA: Diagnosis not present

## 2015-02-01 DIAGNOSIS — Z85820 Personal history of malignant melanoma of skin: Secondary | ICD-10-CM | POA: Diagnosis not present

## 2015-02-01 DIAGNOSIS — L821 Other seborrheic keratosis: Secondary | ICD-10-CM | POA: Diagnosis not present

## 2015-02-01 DIAGNOSIS — L570 Actinic keratosis: Secondary | ICD-10-CM | POA: Diagnosis not present

## 2015-02-01 DIAGNOSIS — Z08 Encounter for follow-up examination after completed treatment for malignant neoplasm: Secondary | ICD-10-CM | POA: Diagnosis not present

## 2015-04-26 ENCOUNTER — Other Ambulatory Visit: Payer: Self-pay | Admitting: Otolaryngology

## 2015-04-26 DIAGNOSIS — L820 Inflamed seborrheic keratosis: Secondary | ICD-10-CM | POA: Diagnosis not present

## 2015-04-26 DIAGNOSIS — D485 Neoplasm of uncertain behavior of skin: Secondary | ICD-10-CM | POA: Diagnosis not present

## 2015-04-26 DIAGNOSIS — Z85820 Personal history of malignant melanoma of skin: Secondary | ICD-10-CM | POA: Diagnosis not present

## 2015-05-17 DIAGNOSIS — Z1389 Encounter for screening for other disorder: Secondary | ICD-10-CM | POA: Diagnosis not present

## 2015-05-17 DIAGNOSIS — Z9181 History of falling: Secondary | ICD-10-CM | POA: Diagnosis not present

## 2015-05-17 DIAGNOSIS — Z6825 Body mass index (BMI) 25.0-25.9, adult: Secondary | ICD-10-CM | POA: Diagnosis not present

## 2015-05-17 DIAGNOSIS — R7303 Prediabetes: Secondary | ICD-10-CM | POA: Diagnosis not present

## 2015-05-17 DIAGNOSIS — I10 Essential (primary) hypertension: Secondary | ICD-10-CM | POA: Diagnosis not present

## 2015-05-17 DIAGNOSIS — I2510 Atherosclerotic heart disease of native coronary artery without angina pectoris: Secondary | ICD-10-CM | POA: Diagnosis not present

## 2015-05-17 DIAGNOSIS — Z23 Encounter for immunization: Secondary | ICD-10-CM | POA: Diagnosis not present

## 2015-05-17 DIAGNOSIS — Z79899 Other long term (current) drug therapy: Secondary | ICD-10-CM | POA: Diagnosis not present

## 2015-05-17 DIAGNOSIS — E785 Hyperlipidemia, unspecified: Secondary | ICD-10-CM | POA: Diagnosis not present

## 2015-08-02 DIAGNOSIS — L821 Other seborrheic keratosis: Secondary | ICD-10-CM | POA: Diagnosis not present

## 2015-08-02 DIAGNOSIS — L570 Actinic keratosis: Secondary | ICD-10-CM | POA: Diagnosis not present

## 2015-08-02 DIAGNOSIS — D1801 Hemangioma of skin and subcutaneous tissue: Secondary | ICD-10-CM | POA: Diagnosis not present

## 2015-08-02 DIAGNOSIS — L812 Freckles: Secondary | ICD-10-CM | POA: Diagnosis not present

## 2015-08-02 DIAGNOSIS — D485 Neoplasm of uncertain behavior of skin: Secondary | ICD-10-CM | POA: Diagnosis not present

## 2015-08-02 DIAGNOSIS — L820 Inflamed seborrheic keratosis: Secondary | ICD-10-CM | POA: Diagnosis not present

## 2015-08-02 DIAGNOSIS — Z85820 Personal history of malignant melanoma of skin: Secondary | ICD-10-CM | POA: Diagnosis not present

## 2015-08-02 DIAGNOSIS — C4441 Basal cell carcinoma of skin of scalp and neck: Secondary | ICD-10-CM | POA: Diagnosis not present

## 2015-09-27 DIAGNOSIS — C44311 Basal cell carcinoma of skin of nose: Secondary | ICD-10-CM | POA: Diagnosis not present

## 2015-09-27 DIAGNOSIS — Z85828 Personal history of other malignant neoplasm of skin: Secondary | ICD-10-CM | POA: Diagnosis not present

## 2015-11-01 DIAGNOSIS — J310 Chronic rhinitis: Secondary | ICD-10-CM | POA: Diagnosis not present

## 2015-11-15 DIAGNOSIS — Z85828 Personal history of other malignant neoplasm of skin: Secondary | ICD-10-CM | POA: Diagnosis not present

## 2015-11-15 DIAGNOSIS — L570 Actinic keratosis: Secondary | ICD-10-CM | POA: Diagnosis not present

## 2015-11-17 ENCOUNTER — Telehealth: Payer: Self-pay

## 2015-11-17 NOTE — Telephone Encounter (Signed)
11/17/2015 Received Faxed medical records packet from [facility] for upcoming appointment on 11/27/2015 with Dr. [name].  Records given to [facility].

## 2015-11-27 ENCOUNTER — Ambulatory Visit (INDEPENDENT_AMBULATORY_CARE_PROVIDER_SITE_OTHER): Payer: Medicare Other | Admitting: Cardiology

## 2015-11-27 ENCOUNTER — Encounter: Payer: Self-pay | Admitting: Cardiology

## 2015-11-27 VITALS — BP 166/76 | HR 72 | Ht 71.0 in | Wt 176.4 lb

## 2015-11-27 DIAGNOSIS — I10 Essential (primary) hypertension: Secondary | ICD-10-CM

## 2015-11-27 DIAGNOSIS — I2510 Atherosclerotic heart disease of native coronary artery without angina pectoris: Secondary | ICD-10-CM | POA: Insufficient documentation

## 2015-11-27 DIAGNOSIS — I25118 Atherosclerotic heart disease of native coronary artery with other forms of angina pectoris: Secondary | ICD-10-CM

## 2015-11-27 DIAGNOSIS — E785 Hyperlipidemia, unspecified: Secondary | ICD-10-CM | POA: Diagnosis not present

## 2015-11-27 NOTE — Progress Notes (Signed)
Cardiology Office Note   Date:  11/27/2015   ID:  Kevin Stewart, DOB [birth year], MRN [id number]  PCP:  [name], MD  Cardiologist:   Daveda Larock [name], MD   Chief Complaint  Patient presents with  . New Evaluation    occassional chest pain, occassional shortness of breath, no edema, occasional cramping in legs, occassional lightheadedness & dizziness      History of Present Illness: Kevin Stewart is a 75 y.o. male who presents to establish cardiac care. He has a history of CAD. He presented in 2003 with a "light" heart attack and had stenting of the proximal LAD with a 3.5 x 18 mm Cypher stent in [facility]. He has been on chronic ASA and Plavix. Has not had regular cardiac follow up in a number of years. He reports he is doing well. He does very physical labor and does have some angina. This occurs with more than usual activity such and lifting more than 4 bags of feed or cement, starting a chain saw, or shoveling. This will resolve with rest in less than 30 minutes. He has only used Ntg 3x since 2003. He has risk factors of HTN, hypercholesterolemia, and family history of CAD. He denies any dyspnea, palpitations or edema. No history of CHF.    Past Medical History  Diagnosis Date  . Hypertension   . Coronary artery disease 2001  . Myocardial infarction ([place]) 2001  . Hyperlipemia   . Stented coronary artery   . Arthritis   . Wears glasses   . Wears dentures     upper-lower partial  . Hx of adenomatous colonic polyps   . Pre-diabetes     Past Surgical History  Procedure Laterality Date  . Eye surgery  2011    both cataracts  . Cardiac catheterization  2001    stent  . Tonsillectomy    . Colonoscopy    . Melanoma excision with sentinel lymph node biopsy  07/21/2012    Procedure: MELANOMA EXCISION WITH SENTINEL LYMPH NODE BIOPSY;  Surgeon: Rozetta Nunnery, MD;  Location: [facility];  Service: General;  Laterality: N/A;  melanoma excision  with sentinel node biopsy   posterior right neck     Current Outpatient Prescriptions  Medication Sig Dispense Refill  . aspirin 81 MG tablet Take 81 mg by mouth daily.    . carvedilol (COREG) 12.5 MG tablet Take 12.5 mg by mouth 2 (two) times daily with a meal.    . clopidogrel (PLAVIX) 75 MG tablet Take 75 mg by mouth daily.    . enalapril-hydrochlorothiazide (VASERETIC) 10-25 MG per tablet Take 1 tablet by mouth daily. Takes half    . ezetimibe-simvastatin (VYTORIN) 10-40 MG per tablet Take 1 tablet by mouth at bedtime.    . Omega-3 Fatty Acids (OMEGA-3 IQ PO) Take 1 tablet by mouth daily.     No current facility-administered medications for this visit.    Allergies:   Shrimp    Social History:  The patient  reports that he quit smoking about 42 years ago. He does not have any smokeless tobacco history on file. He reports that he does not drink alcohol or use illicit drugs.   Family History:  The patient's family history includes Cancer - Colon (age of onset: 25) in his brother; Cancer - Other (age of onset: 53) in his father; Cancer - Other (age of onset: 86) in his sister; Heart attack (age of onset: 4) in  his mother; Heart attack (age of onset: 26) in his brother.    ROS:  Please see the history of present illness.   Otherwise, review of systems are positive for none.   All other systems are reviewed and negative.    PHYSICAL EXAM: VS:  BP 166/76 mmHg  Pulse 72  Ht 5\' 11"  (1.803 m)  Wt 80.003 kg (176 lb 6 oz)  BMI 24.61 kg/m2 , BMI Body mass index is 24.61 kg/(m^2). GEN: Well nourished, well developed, in no acute distress HEENT: normal Neck: no JVD, carotid bruits, or masses Cardiac: RRR; normal S1-2. no murmurs, rubs, or gallops,no edema  Respiratory:  clear to auscultation bilaterally, normal work of breathing GI: soft, nontender, nondistended, + BS MS: no deformity or atrophy Skin: warm and dry, no rash Neuro:  Strength and sensation are intact Psych: euthymic  mood, full affect   EKG:  EKG is ordered today. The ekg ordered today demonstrates NSR rate 72. Normal Ecg. I have personally reviewed and interpreted this study.    Recent Labs: No results found for requested labs within last 365 days.    Lipid Panel No results found for: CHOL, TRIG, HDL, CHOLHDL, VLDL, LDLCALC, LDLDIRECT    Wt Readings from Last 3 Encounters:  11/27/15 80.003 kg (176 lb 6 oz)  07/21/12 74.447 kg (164 lb 2 oz)      Other studies Reviewed: Additional studies/ records that were reviewed today include: labs from Dr. Lisbeth Ply. Review of the above records demonstrates: Cholesterol 180, triglycerides 75, HDL 61, LDL 104. Glucose 116. Other chemistries are normal. CBC normal. A1c 5.9%.    ASSESSMENT AND PLAN:  1.  Coronary artery disease- native vessel with stable angina class 2. Remote stenting of the LAD in 2003 with a Cypher stent. Now on DAPT with ASA and Plavix. On beta blocker and ACEi. Recommend ETT to assess current risk. If normal will continue risk factor modification and follow up in one year. If abnormal would consider cardiac cath.  2. Hypercholesterolemia. He reports insurance will no long cover Vytorin. History of intolerance to Atorvastatin. May consider switching to Crestor 20 mg daily  3. Pre diabetes.  4. HTN controlled.    Current medicines are reviewed at length with the patient today.  The patient does not have concerns regarding medicines.  The following changes have been made:  no change  Labs/ tests ordered today include:  Orders Placed This Encounter  Procedures  . Exercise Tolerance Test  . EKG 12-Lead     Disposition:   FU with TBD based on ETT.    Signed, Genie Wenke [name], MD  11/27/2015 8:29 PM    [facility] [address] [place], [place], [address] Phone [phone], Fax [phone]

## 2015-11-27 NOTE — Patient Instructions (Signed)
We will schedule you for a stress test  Continue your current therapy  I would recommend trying Crestor if your Vytorin comes off formulary

## 2015-11-29 DIAGNOSIS — I2510 Atherosclerotic heart disease of native coronary artery without angina pectoris: Secondary | ICD-10-CM | POA: Diagnosis not present

## 2015-11-29 DIAGNOSIS — Z6825 Body mass index (BMI) 25.0-25.9, adult: Secondary | ICD-10-CM | POA: Diagnosis not present

## 2015-11-29 DIAGNOSIS — I10 Essential (primary) hypertension: Secondary | ICD-10-CM | POA: Diagnosis not present

## 2015-11-29 DIAGNOSIS — E785 Hyperlipidemia, unspecified: Secondary | ICD-10-CM | POA: Diagnosis not present

## 2015-11-29 DIAGNOSIS — E663 Overweight: Secondary | ICD-10-CM | POA: Diagnosis not present

## 2015-11-29 DIAGNOSIS — R7303 Prediabetes: Secondary | ICD-10-CM | POA: Diagnosis not present

## 2015-11-29 DIAGNOSIS — Z125 Encounter for screening for malignant neoplasm of prostate: Secondary | ICD-10-CM | POA: Diagnosis not present

## 2015-12-14 ENCOUNTER — Telehealth (HOSPITAL_COMMUNITY): Payer: Self-pay

## 2015-12-14 NOTE — Telephone Encounter (Signed)
Encounter complete. 

## 2015-12-19 ENCOUNTER — Encounter (HOSPITAL_COMMUNITY): Payer: Self-pay | Admitting: *Deleted

## 2015-12-19 ENCOUNTER — Ambulatory Visit (HOSPITAL_COMMUNITY)
Admission: RE | Admit: 2015-12-19 | Discharge: 2015-12-19 | Disposition: A | Discharge status: Home / Self Care | Payer: Medicare Other | Source: Ambulatory Visit | Attending: Cardiology | Admitting: Cardiology

## 2015-12-19 ENCOUNTER — Telehealth: Payer: Self-pay

## 2015-12-19 ENCOUNTER — Ambulatory Visit
Admission: RE | Admit: 2015-12-19 | Discharge: 2015-12-19 | Disposition: A | Discharge status: Home / Self Care | Payer: Medicare Other | Source: Ambulatory Visit | Attending: Cardiology | Admitting: Cardiology

## 2015-12-19 DIAGNOSIS — I10 Essential (primary) hypertension: Secondary | ICD-10-CM | POA: Insufficient documentation

## 2015-12-19 DIAGNOSIS — I25118 Atherosclerotic heart disease of native coronary artery with other forms of angina pectoris: Secondary | ICD-10-CM | POA: Diagnosis not present

## 2015-12-19 DIAGNOSIS — R9439 Abnormal result of other cardiovascular function study: Secondary | ICD-10-CM | POA: Insufficient documentation

## 2015-12-19 DIAGNOSIS — E785 Hyperlipidemia, unspecified: Secondary | ICD-10-CM | POA: Diagnosis not present

## 2015-12-19 DIAGNOSIS — R0602 Shortness of breath: Secondary | ICD-10-CM | POA: Insufficient documentation

## 2015-12-19 DIAGNOSIS — Z01810 Encounter for preprocedural cardiovascular examination: Secondary | ICD-10-CM | POA: Diagnosis not present

## 2015-12-19 LAB — CBC WITH DIFFERENTIAL/PLATELET
BASOS ABS: 0 {cells}/uL (ref 0–200)
Basophils Relative: 0 %
EOS ABS: 77 {cells}/uL (ref 15–500)
Eosinophils Relative: 1 %
HEMATOCRIT: 42.3 % (ref 38.5–50.0)
Hemoglobin: 14.7 g/dL (ref 13.2–17.1)
LYMPHS PCT: 25 %
Lymphs Abs: 1925 cells/uL (ref 850–3900)
MCH: 30.9 pg (ref 27.0–33.0)
MCHC: 34.8 g/dL (ref 32.0–36.0)
MCV: 88.9 fL (ref 80.0–100.0)
MONO ABS: 308 {cells}/uL (ref 200–950)
MPV: 9.6 fL (ref 7.5–12.5)
Monocytes Relative: 4 %
NEUTROS PCT: 70 %
Neutro Abs: 5390 cells/uL (ref 1500–7800)
Platelets: 216 10*3/uL (ref 140–400)
RBC: 4.76 MIL/uL (ref 4.20–5.80)
RDW: 13.5 % (ref 11.0–15.0)
WBC: 7.7 10*3/uL (ref 3.8–10.8)

## 2015-12-19 LAB — BASIC METABOLIC PANEL
BUN: 23 mg/dL (ref 7–25)
CO2: 25 mmol/L (ref 20–31)
Calcium: 9.8 mg/dL (ref 8.6–10.3)
Chloride: 104 mmol/L (ref 98–110)
Creat: 0.77 mg/dL (ref 0.70–1.18)
GLUCOSE: 95 mg/dL (ref 65–99)
POTASSIUM: 4.1 mmol/L (ref 3.5–5.3)
Sodium: 139 mmol/L (ref 135–146)

## 2015-12-19 LAB — EXERCISE TOLERANCE TEST
CHL CUP RESTING HR STRESS: 86 {beats}/min
CSEPED: 7 min
Estimated workload: 8.5 METS
MPHR: 147 {beats}/min
Peak HR: 151 {beats}/min
Percent HR: 102 %
RPE: 16

## 2015-12-19 LAB — PROTIME-INR
INR: 1 (ref <1.50)
Prothrombin Time: 13.3 seconds (ref 11.6–15.2)

## 2015-12-19 NOTE — Progress Notes (Unsigned)
Patient ID: Kevin Stewart, male   DOB: April 28, 1941, 75 y.o.   MRN: [id number] Dr. Ellyn Hack reviewed the Patient's ETT, called Dr. [name] at Cath lab and instructed the patient that Dr. [name] wanted him to have a Cardiac Cath within the week.  All information was conveyed to [name] and patient was escorted to scheduling area awaiting instructions.

## 2015-12-19 NOTE — Telephone Encounter (Signed)
Patient had a positive GXT this morning.Left cardiac cath scheduled Wed 12/20/15 at 2:30 pm with Dr.Jordan.Cath instructions reviewed with patient.Patient voiced understanding.Follow up appointment scheduled with Rosaria Ferries PA

## 2015-12-20 ENCOUNTER — Encounter (HOSPITAL_COMMUNITY): Payer: Self-pay | Admitting: General Practice

## 2015-12-20 ENCOUNTER — Encounter (HOSPITAL_COMMUNITY)
Admission: RE | Disposition: A | Discharge status: Home / Self Care | Payer: Self-pay | Source: Ambulatory Visit | Attending: Cardiology

## 2015-12-20 ENCOUNTER — Ambulatory Visit (HOSPITAL_COMMUNITY)
Admission: RE | Admit: 2015-12-20 | Discharge: 2015-12-21 | Disposition: A | Discharge status: Home / Self Care | Payer: Medicare Other | Source: Ambulatory Visit | Attending: Cardiology | Admitting: Cardiology

## 2015-12-20 DIAGNOSIS — R7303 Prediabetes: Secondary | ICD-10-CM | POA: Diagnosis not present

## 2015-12-20 DIAGNOSIS — E7800 Pure hypercholesterolemia, unspecified: Secondary | ICD-10-CM | POA: Diagnosis not present

## 2015-12-20 DIAGNOSIS — I10 Essential (primary) hypertension: Secondary | ICD-10-CM | POA: Diagnosis present

## 2015-12-20 DIAGNOSIS — Z87891 Personal history of nicotine dependence: Secondary | ICD-10-CM | POA: Insufficient documentation

## 2015-12-20 DIAGNOSIS — I25118 Atherosclerotic heart disease of native coronary artery with other forms of angina pectoris: Secondary | ICD-10-CM | POA: Diagnosis not present

## 2015-12-20 DIAGNOSIS — Z7902 Long term (current) use of antithrombotics/antiplatelets: Secondary | ICD-10-CM | POA: Insufficient documentation

## 2015-12-20 DIAGNOSIS — Z8249 Family history of ischemic heart disease and other diseases of the circulatory system: Secondary | ICD-10-CM | POA: Insufficient documentation

## 2015-12-20 DIAGNOSIS — I2510 Atherosclerotic heart disease of native coronary artery without angina pectoris: Secondary | ICD-10-CM | POA: Diagnosis present

## 2015-12-20 DIAGNOSIS — Z800 Family history of malignant neoplasm of digestive organs: Secondary | ICD-10-CM | POA: Insufficient documentation

## 2015-12-20 DIAGNOSIS — Z7982 Long term (current) use of aspirin: Secondary | ICD-10-CM | POA: Diagnosis not present

## 2015-12-20 DIAGNOSIS — M1990 Unspecified osteoarthritis, unspecified site: Secondary | ICD-10-CM | POA: Diagnosis not present

## 2015-12-20 DIAGNOSIS — I209 Angina pectoris, unspecified: Secondary | ICD-10-CM

## 2015-12-20 DIAGNOSIS — I25119 Atherosclerotic heart disease of native coronary artery with unspecified angina pectoris: Secondary | ICD-10-CM | POA: Insufficient documentation

## 2015-12-20 DIAGNOSIS — R9439 Abnormal result of other cardiovascular function study: Secondary | ICD-10-CM | POA: Diagnosis present

## 2015-12-20 DIAGNOSIS — Z955 Presence of coronary angioplasty implant and graft: Secondary | ICD-10-CM | POA: Diagnosis not present

## 2015-12-20 DIAGNOSIS — E785 Hyperlipidemia, unspecified: Secondary | ICD-10-CM | POA: Diagnosis present

## 2015-12-20 DIAGNOSIS — I252 Old myocardial infarction: Secondary | ICD-10-CM | POA: Insufficient documentation

## 2015-12-20 HISTORY — DX: Calculus of kidney: N20.0

## 2015-12-20 HISTORY — DX: Malignant melanoma of scalp and neck: C43.4

## 2015-12-20 HISTORY — DX: Basal cell carcinoma of skin of unspecified parts of face: C44.310

## 2015-12-20 HISTORY — PX: CORONARY ANGIOPLASTY WITH STENT PLACEMENT: SHX49

## 2015-12-20 HISTORY — PX: CARDIAC CATHETERIZATION: SHX172

## 2015-12-20 HISTORY — DX: Migraine, unspecified, not intractable, without status migrainosus: G43.909

## 2015-12-20 HISTORY — DX: Hyperlipidemia, unspecified: E78.5

## 2015-12-20 LAB — CBC
HEMATOCRIT: 40.1 % (ref 39.0–52.0)
HEMOGLOBIN: 13.7 g/dL (ref 13.0–17.0)
MCH: 29.9 pg (ref 26.0–34.0)
MCHC: 34.2 g/dL (ref 30.0–36.0)
MCV: 87.6 fL (ref 78.0–100.0)
Platelets: 201 10*3/uL (ref 150–400)
RBC: 4.58 MIL/uL (ref 4.22–5.81)
RDW: 13.2 % (ref 11.5–15.5)
WBC: 8.5 10*3/uL (ref 4.0–10.5)

## 2015-12-20 LAB — CREATININE, SERUM: Creatinine, Ser: 0.75 mg/dL (ref 0.61–1.24)

## 2015-12-20 SURGERY — LEFT HEART CATH AND CORONARY ANGIOGRAPHY

## 2015-12-20 MED ORDER — MIDAZOLAM HCL 2 MG/2ML IJ SOLN
INTRAMUSCULAR | Status: AC
  Filled 2015-12-20: qty 2

## 2015-12-20 MED ORDER — LIDOCAINE HCL (PF) 1 % IJ SOLN
INTRAMUSCULAR | Status: DC | PRN
Start: 2015-12-20 — End: 2015-12-20
  Administered 2015-12-20: 2 mL

## 2015-12-20 MED ORDER — FENTANYL CITRATE (PF) 100 MCG/2ML IJ SOLN
INTRAMUSCULAR | Status: AC
  Filled 2015-12-20: qty 2

## 2015-12-20 MED ORDER — SODIUM CHLORIDE 0.9% FLUSH: 3.0000 mL | Freq: Two times a day (BID) | INTRAVENOUS | Status: DC

## 2015-12-20 MED ORDER — HEPARIN (PORCINE) IN NACL 2-0.9 UNIT/ML-% IJ SOLN
INTRAMUSCULAR | Status: AC
  Filled 2015-12-20: qty 1000

## 2015-12-20 MED ORDER — ANGIOPLASTY BOOK
Freq: Once | Status: AC
Start: 2015-12-20 — End: 2015-12-20
  Administered 2015-12-20: 23:00:00
  Filled 2015-12-20: qty 1

## 2015-12-20 MED ORDER — CLOPIDOGREL BISULFATE 75 MG PO TABS
75.0000 mg | ORAL_TABLET | Freq: Every day | ORAL | Status: DC
  Filled 2015-12-20: qty 1

## 2015-12-20 MED ORDER — SODIUM CHLORIDE 0.9 % IV SOLN: 250.0000 mL | INTRAVENOUS | Status: DC | PRN

## 2015-12-20 MED ORDER — CLOPIDOGREL BISULFATE 75 MG PO TABS
ORAL_TABLET | ORAL | Status: AC
  Filled 2015-12-20: qty 1

## 2015-12-20 MED ORDER — SODIUM CHLORIDE 0.9% FLUSH
3.0000 mL | Freq: Two times a day (BID) | INTRAVENOUS | Status: DC
  Administered 2015-12-20: 19:00:00 3 mL via INTRAVENOUS

## 2015-12-20 MED ORDER — LIDOCAINE HCL (PF) 1 % IJ SOLN
INTRAMUSCULAR | Status: AC
  Filled 2015-12-20: qty 30

## 2015-12-20 MED ORDER — HEPARIN (PORCINE) IN NACL 2-0.9 UNIT/ML-% IJ SOLN
INTRAMUSCULAR | Status: DC | PRN
  Administered 2015-12-20: 1000 mL

## 2015-12-20 MED ORDER — ASPIRIN 81 MG PO TABS: 81.0000 mg | ORAL_TABLET | Freq: Every day | ORAL | Status: DC

## 2015-12-20 MED ORDER — BIVALIRUDIN BOLUS VIA INFUSION - CUPID
INTRAVENOUS | Status: DC | PRN
Start: 2015-12-20 — End: 2015-12-20
  Administered 2015-12-20: 57.825 mg via INTRAVENOUS

## 2015-12-20 MED ORDER — EZETIMIBE-SIMVASTATIN 10-40 MG PO TABS
1.0000 | ORAL_TABLET | Freq: Every day | ORAL | Status: DC
  Administered 2015-12-20: 1 via ORAL
  Filled 2015-12-20: qty 1

## 2015-12-20 MED ORDER — IOPAMIDOL (ISOVUE-370) INJECTION 76%
INTRAVENOUS | Status: AC
  Filled 2015-12-20: qty 100

## 2015-12-20 MED ORDER — CARVEDILOL 12.5 MG PO TABS
12.5000 mg | ORAL_TABLET | Freq: Two times a day (BID) | ORAL | Status: DC
  Administered 2015-12-20 – 2015-12-21 (×2): 12.5 mg via ORAL
  Filled 2015-12-20 (×2): qty 1

## 2015-12-20 MED ORDER — VERAPAMIL HCL 2.5 MG/ML IV SOLN
INTRAVENOUS | Status: AC
Start: 2015-12-20 — End: 2015-12-20
  Filled 2015-12-20: qty 2

## 2015-12-20 MED ORDER — SODIUM CHLORIDE 0.9 % IV SOLN
250.0000 mL | INTRAVENOUS | Status: DC | PRN
  Administered 2015-12-20: 250 mL via INTRAVENOUS

## 2015-12-20 MED ORDER — SODIUM CHLORIDE 0.9% FLUSH: 3.0000 mL | INTRAVENOUS | Status: DC | PRN

## 2015-12-20 MED ORDER — HEPARIN SODIUM (PORCINE) 5000 UNIT/ML IJ SOLN
5000.0000 [IU] | Freq: Three times a day (TID) | INTRAMUSCULAR | Status: DC
  Administered 2015-12-20 – 2015-12-21 (×2): 5000 [IU] via SUBCUTANEOUS
  Filled 2015-12-20 (×2): qty 1

## 2015-12-20 MED ORDER — ASPIRIN 81 MG PO CHEW
81.0000 mg | CHEWABLE_TABLET | Freq: Every day | ORAL | Status: DC
  Administered 2015-12-21: 81 mg via ORAL
  Filled 2015-12-20: qty 1

## 2015-12-20 MED ORDER — NITROGLYCERIN 1 MG/10 ML FOR IR/CATH LAB
INTRA_ARTERIAL | Status: AC
  Filled 2015-12-20: qty 10

## 2015-12-20 MED ORDER — SODIUM CHLORIDE 0.9 % WEIGHT BASED INFUSION
3.0000 mL/kg/h | INTRAVENOUS | Status: DC
  Administered 2015-12-20: 3 mL/kg/h via INTRAVENOUS

## 2015-12-20 MED ORDER — HYDROCHLOROTHIAZIDE 25 MG PO TABS: 25.0000 mg | ORAL_TABLET | Freq: Every day | ORAL | Status: DC

## 2015-12-20 MED ORDER — NITROGLYCERIN IN D5W 200-5 MCG/ML-% IV SOLN
INTRAVENOUS | Status: AC
  Filled 2015-12-20: qty 250

## 2015-12-20 MED ORDER — FENTANYL CITRATE (PF) 100 MCG/2ML IJ SOLN
INTRAMUSCULAR | Status: DC | PRN
  Administered 2015-12-20 (×4): 50 ug via INTRAVENOUS

## 2015-12-20 MED ORDER — CLOPIDOGREL BISULFATE 75 MG PO TABS
ORAL_TABLET | ORAL | Status: AC
Start: 2015-12-20 — End: 2015-12-20
  Filled 2015-12-20: qty 1

## 2015-12-20 MED ORDER — BIVALIRUDIN 250 MG IV SOLR
INTRAVENOUS | Status: AC
  Filled 2015-12-20: qty 250

## 2015-12-20 MED ORDER — HEPARIN SODIUM (PORCINE) 1000 UNIT/ML IJ SOLN
INTRAMUSCULAR | Status: DC | PRN
  Administered 2015-12-20: 3700 [IU] via INTRAVENOUS

## 2015-12-20 MED ORDER — SODIUM CHLORIDE 0.9 % WEIGHT BASED INFUSION: 1.0000 mL/kg/h | INTRAVENOUS | Status: DC

## 2015-12-20 MED ORDER — HEPARIN SODIUM (PORCINE) 1000 UNIT/ML IJ SOLN
INTRAMUSCULAR | Status: AC
  Filled 2015-12-20: qty 1

## 2015-12-20 MED ORDER — ENALAPRIL-HYDROCHLOROTHIAZIDE 10-25 MG PO TABS: 1.0000 | ORAL_TABLET | Freq: Every day | ORAL | Status: DC

## 2015-12-20 MED ORDER — OXYCODONE-ACETAMINOPHEN 5-325 MG PO TABS: 1.0000 | ORAL_TABLET | ORAL | Status: DC | PRN

## 2015-12-20 MED ORDER — IOPAMIDOL (ISOVUE-370) INJECTION 76%
INTRAVENOUS | Status: DC | PRN
  Administered 2015-12-20: 180 mL via INTRAVENOUS

## 2015-12-20 MED ORDER — ASPIRIN 81 MG PO CHEW: 81.0000 mg | CHEWABLE_TABLET | ORAL | Status: DC

## 2015-12-20 MED ORDER — ENALAPRIL MALEATE 10 MG PO TABS
10.0000 mg | ORAL_TABLET | Freq: Every day | ORAL | Status: DC
  Filled 2015-12-20: qty 1

## 2015-12-20 MED ORDER — NITROGLYCERIN IN D5W 200-5 MCG/ML-% IV SOLN: 10.0000 ug/min | INTRAVENOUS | Status: AC

## 2015-12-20 MED ORDER — ATORVASTATIN CALCIUM 80 MG PO TABS: 80.0000 mg | ORAL_TABLET | Freq: Every day | ORAL | Status: DC

## 2015-12-20 MED ORDER — ACETAMINOPHEN 325 MG PO TABS: 650.0000 mg | ORAL_TABLET | ORAL | Status: DC | PRN

## 2015-12-20 MED ORDER — NITROGLYCERIN IN D5W 200-5 MCG/ML-% IV SOLN
INTRAVENOUS | Status: DC | PRN
  Administered 2015-12-20: 10 ug/min via INTRAVENOUS

## 2015-12-20 MED ORDER — CLOPIDOGREL BISULFATE 300 MG PO TABS
ORAL_TABLET | ORAL | Status: DC | PRN
  Administered 2015-12-20 (×2): 150 mg via ORAL

## 2015-12-20 MED ORDER — TICAGRELOR 90 MG PO TABS: 90.0000 mg | ORAL_TABLET | Freq: Two times a day (BID) | ORAL | Status: DC

## 2015-12-20 MED ORDER — HEPARIN (PORCINE) IN NACL 2-0.9 UNIT/ML-% IJ SOLN
INTRAMUSCULAR | Status: DC | PRN
  Administered 2015-12-20: 1.2 mL via INTRA_ARTERIAL

## 2015-12-20 MED ORDER — ONDANSETRON HCL 4 MG/2ML IJ SOLN: 4.0000 mg | Freq: Four times a day (QID) | INTRAMUSCULAR | Status: DC | PRN

## 2015-12-20 MED ORDER — SODIUM CHLORIDE 0.9 % WEIGHT BASED INFUSION: 1.0000 mL/kg/h | INTRAVENOUS | Status: AC

## 2015-12-20 MED ORDER — SODIUM CHLORIDE 0.9 % IV SOLN
250.0000 mg | INTRAVENOUS | Status: DC | PRN
  Administered 2015-12-20: 1.75 mg/kg/h via INTRAVENOUS

## 2015-12-20 MED ORDER — MIDAZOLAM HCL 2 MG/2ML IJ SOLN
INTRAMUSCULAR | Status: DC | PRN
  Administered 2015-12-20 (×3): 1 mg via INTRAVENOUS

## 2015-12-20 SURGICAL SUPPLY — 18 items
BALLN ANGIOSCULPT RX 3.0X10 (BALLOONS) ×3
BALLN ~~LOC~~ EUPHORA RX 3.75X8 (BALLOONS) ×3
BALLOON ANGIOSCULPT RX 3.0X10 (BALLOONS) ×1 IMPLANT
BALLOON ~~LOC~~ EUPHORA RX 3.75X8 (BALLOONS) ×1 IMPLANT
CATH INFINITI 5 FR JL3.5 (CATHETERS) ×3 IMPLANT
CATH INFINITI JR4 5F (CATHETERS) ×3 IMPLANT
CATH VISTA GUIDE 6FR XBLAD3.5 (CATHETERS) ×3 IMPLANT
DEVICE RAD COMP TR BAND LRG (VASCULAR PRODUCTS) ×3 IMPLANT
GLIDESHEATH SLEND A-KIT 6F 22G (SHEATH) ×3 IMPLANT
KIT ENCORE 26 ADVANTAGE (KITS) ×3 IMPLANT
KIT HEART LEFT (KITS) ×3 IMPLANT
PACK CARDIAC CATHETERIZATION (CUSTOM PROCEDURE TRAY) ×3 IMPLANT
STENT RESOLUTE INTEG 3.5X15 (Permanent Stent) ×3 IMPLANT
TRANSDUCER W/STOPCOCK (MISCELLANEOUS) ×3 IMPLANT
TUBING CIL FLEX 10 FLL-RA (TUBING) ×3 IMPLANT
WIRE ASAHI PROWATER 180CM (WIRE) ×3 IMPLANT
WIRE RUNTHROUGH .014X180CM (WIRE) ×3 IMPLANT
WIRE SAFE-T 1.5MM-J .035X260CM (WIRE) ×3 IMPLANT

## 2015-12-20 NOTE — Interval H&P Note (Signed)
History and Physical Interval Note:  12/20/2015 5:51 PM  Kevin Stewart  has presented today for surgery, with the diagnosis of abnormal treadmill  The various methods of treatment have been discussed with the patient and family. After consideration of risks, benefits and other options for treatment, the patient has consented to  Procedure(s): Left Heart Cath and Coronary Angiography (N/A) Coronary Stent Intervention (N/A) as a surgical intervention .  The patient's history has been reviewed, patient examined, no change in status, stable for surgery.  I have reviewed the patient's chart and labs.  Questions were answered to the patient's satisfaction.   Cath Lab Visit (complete for each Cath Lab visit)  Clinical Evaluation Leading to the Procedure:   ACS: No.  Non-ACS:    Anginal Classification: CCS II  Anti-ischemic medical therapy: Minimal Therapy (1 class of medications)  Non-Invasive Test Results: High-risk stress test findings: cardiac mortality >3%/year  Prior CABG: No previous CABG        Kevin Stewart [facility] 12/20/2015 5:51 PM

## 2015-12-20 NOTE — H&P (View-Only) (Signed)
Cardiology Office Note   Date:  11/27/2015   ID:  Kevin Stewart, DOB 1940/07/26, MRN [id number]  PCP:  [name], MD  Cardiologist:   Ulmer Degen [name], MD   Chief Complaint  Patient presents with  . New Evaluation    occassional chest pain, occassional shortness of breath, no edema, occasional cramping in legs, occassional lightheadedness & dizziness      History of Present Illness: Kevin Stewart is a 75 y.o. male who presents to establish cardiac care. He has a history of CAD. He presented in 2003 with a "light" heart attack and had stenting of the proximal LAD with a 3.5 x 18 mm Cypher stent in [facility]. He has been on chronic ASA and Plavix. Has not had regular cardiac follow up in a number of years. He reports he is doing well. He does very physical labor and does have some angina. This occurs with more than usual activity such and lifting more than 4 bags of feed or cement, starting a chain saw, or shoveling. This will resolve with rest in less than 30 minutes. He has only used Ntg 3x since 2003. He has risk factors of HTN, hypercholesterolemia, and family history of CAD. He denies any dyspnea, palpitations or edema. No history of CHF.    Past Medical History  Diagnosis Date  . Hypertension   . Coronary artery disease 2001  . Myocardial infarction ([place]) 2001  . Hyperlipemia   . Stented coronary artery   . Arthritis   . Wears glasses   . Wears dentures     upper-lower partial  . Hx of adenomatous colonic polyps   . Pre-diabetes     Past Surgical History  Procedure Laterality Date  . Eye surgery  2011    both cataracts  . Cardiac catheterization  2001    stent  . Tonsillectomy    . Colonoscopy    . Melanoma excision with sentinel lymph node biopsy  07/21/2012    Procedure: MELANOMA EXCISION WITH SENTINEL LYMPH NODE BIOPSY;  Surgeon: Rozetta Nunnery, MD;  Location: [facility];  Service: General;  Laterality: N/A;  melanoma excision  with sentinel node biopsy   posterior right neck     Current Outpatient Prescriptions  Medication Sig Dispense Refill  . aspirin 81 MG tablet Take 81 mg by mouth daily.    . carvedilol (COREG) 12.5 MG tablet Take 12.5 mg by mouth 2 (two) times daily with a meal.    . clopidogrel (PLAVIX) 75 MG tablet Take 75 mg by mouth daily.    . enalapril-hydrochlorothiazide (VASERETIC) 10-25 MG per tablet Take 1 tablet by mouth daily. Takes half    . ezetimibe-simvastatin (VYTORIN) 10-40 MG per tablet Take 1 tablet by mouth at bedtime.    . Omega-3 Fatty Acids (OMEGA-3 IQ PO) Take 1 tablet by mouth daily.     No current facility-administered medications for this visit.    Allergies:   Shrimp    Social History:  The patient  reports that he quit smoking about 42 years ago. He does not have any smokeless tobacco history on file. He reports that he does not drink alcohol or use illicit drugs.   Family History:  The patient's family history includes Cancer - Colon (age of onset: 22) in his brother; Cancer - Other (age of onset: 41) in his father; Cancer - Other (age of onset: 75) in his sister; Heart attack (age of onset: 62) in  his mother; Heart attack (age of onset: 46) in his brother.    ROS:  Please see the history of present illness.   Otherwise, review of systems are positive for none.   All other systems are reviewed and negative.    PHYSICAL EXAM: VS:  BP 166/76 mmHg  Pulse 72  Ht 5\' 11"  (1.803 m)  Wt 80.003 kg (176 lb 6 oz)  BMI 24.61 kg/m2 , BMI Body mass index is 24.61 kg/(m^2). GEN: Well nourished, well developed, in no acute distress HEENT: normal Neck: no JVD, carotid bruits, or masses Cardiac: RRR; normal S1-2. no murmurs, rubs, or gallops,no edema  Respiratory:  clear to auscultation bilaterally, normal work of breathing GI: soft, nontender, nondistended, + BS MS: no deformity or atrophy Skin: warm and dry, no rash Neuro:  Strength and sensation are intact Psych: euthymic  mood, full affect   EKG:  EKG is ordered today. The ekg ordered today demonstrates NSR rate 72. Normal Ecg. I have personally reviewed and interpreted this study.    Recent Labs: No results found for requested labs within last 365 days.    Lipid Panel No results found for: CHOL, TRIG, HDL, CHOLHDL, VLDL, LDLCALC, LDLDIRECT    Wt Readings from Last 3 Encounters:  11/27/15 80.003 kg (176 lb 6 oz)  07/21/12 74.447 kg (164 lb 2 oz)      Other studies Reviewed: Additional studies/ records that were reviewed today include: labs from Dr. Lisbeth Ply. Review of the above records demonstrates: Cholesterol 180, triglycerides 75, HDL 61, LDL 104. Glucose 116. Other chemistries are normal. CBC normal. A1c 5.9%.    ASSESSMENT AND PLAN:  1.  Coronary artery disease- native vessel with stable angina class 2. Remote stenting of the LAD in 2003 with a Cypher stent. Now on DAPT with ASA and Plavix. On beta blocker and ACEi. Recommend ETT to assess current risk. If normal will continue risk factor modification and follow up in one year. If abnormal would consider cardiac cath.  2. Hypercholesterolemia. He reports insurance will no long cover Vytorin. History of intolerance to Atorvastatin. May consider switching to Crestor 20 mg daily  3. Pre diabetes.  4. HTN controlled.    Current medicines are reviewed at length with the patient today.  The patient does not have concerns regarding medicines.  The following changes have been made:  no change  Labs/ tests ordered today include:  Orders Placed This Encounter  Procedures  . Exercise Tolerance Test  . EKG 12-Lead     Disposition:   FU with TBD based on ETT.    Signed, Travarus Trudo [name], MD  11/27/2015 8:29 PM    [facility] [address] [place], [place], [address] Phone [phone], Fax [phone]

## 2015-12-20 NOTE — Care Management Note (Signed)
Case Management Note  Patient Details  Name: EURA RUEDA MRN: [id number] Date of Birth: Sep 02, 1940  Subjective/Objective:      Patient lives with wife, pta indep, states will not need hh services,  NCM will cont to follow for dc needs. He has been on plavix.               Action/Plan:   Expected Discharge Date:                  Expected Discharge Plan:  Home/Self Care  In-House Referral:     Discharge planning Services  CM Consult  Post Acute Care Choice:    Choice offered to:     DME Arranged:    DME Agency:     HH Arranged:    [place] Agency:     Status of Service:  Completed, signed off  Medicare Important Message Given:    Date Medicare IM Given:    Medicare IM give by:    Date Additional Medicare IM Given:    Additional Medicare Important Message give by:     If discussed at [place] of Stay Meetings, dates discussed:    Additional Comments:  Zenon Mayo, RN 12/20/2015, 6:02 PM

## 2015-12-21 ENCOUNTER — Encounter (HOSPITAL_COMMUNITY): Payer: Self-pay | Admitting: Interventional Cardiology

## 2015-12-21 DIAGNOSIS — I209 Angina pectoris, unspecified: Secondary | ICD-10-CM

## 2015-12-21 DIAGNOSIS — Z87891 Personal history of nicotine dependence: Secondary | ICD-10-CM | POA: Diagnosis not present

## 2015-12-21 DIAGNOSIS — I25110 Atherosclerotic heart disease of native coronary artery with unstable angina pectoris: Secondary | ICD-10-CM

## 2015-12-21 DIAGNOSIS — Z7902 Long term (current) use of antithrombotics/antiplatelets: Secondary | ICD-10-CM | POA: Diagnosis not present

## 2015-12-21 DIAGNOSIS — R7303 Prediabetes: Secondary | ICD-10-CM

## 2015-12-21 DIAGNOSIS — I25119 Atherosclerotic heart disease of native coronary artery with unspecified angina pectoris: Secondary | ICD-10-CM | POA: Diagnosis not present

## 2015-12-21 DIAGNOSIS — E785 Hyperlipidemia, unspecified: Secondary | ICD-10-CM | POA: Diagnosis not present

## 2015-12-21 DIAGNOSIS — E7800 Pure hypercholesterolemia, unspecified: Secondary | ICD-10-CM | POA: Diagnosis not present

## 2015-12-21 DIAGNOSIS — Z955 Presence of coronary angioplasty implant and graft: Secondary | ICD-10-CM | POA: Diagnosis not present

## 2015-12-21 DIAGNOSIS — M1990 Unspecified osteoarthritis, unspecified site: Secondary | ICD-10-CM | POA: Diagnosis not present

## 2015-12-21 DIAGNOSIS — I252 Old myocardial infarction: Secondary | ICD-10-CM | POA: Diagnosis not present

## 2015-12-21 DIAGNOSIS — Z8249 Family history of ischemic heart disease and other diseases of the circulatory system: Secondary | ICD-10-CM | POA: Diagnosis not present

## 2015-12-21 DIAGNOSIS — I10 Essential (primary) hypertension: Secondary | ICD-10-CM | POA: Diagnosis not present

## 2015-12-21 DIAGNOSIS — Z7982 Long term (current) use of aspirin: Secondary | ICD-10-CM | POA: Diagnosis not present

## 2015-12-21 LAB — CBC
HEMATOCRIT: 40.7 % (ref 39.0–52.0)
Hemoglobin: 13.8 g/dL (ref 13.0–17.0)
MCH: 29.5 pg (ref 26.0–34.0)
MCHC: 33.9 g/dL (ref 30.0–36.0)
MCV: 87 fL (ref 78.0–100.0)
Platelets: 209 10*3/uL (ref 150–400)
RBC: 4.68 MIL/uL (ref 4.22–5.81)
RDW: 13.3 % (ref 11.5–15.5)
WBC: 9.5 10*3/uL (ref 4.0–10.5)

## 2015-12-21 LAB — BASIC METABOLIC PANEL
Anion gap: 6 (ref 5–15)
BUN: 11 mg/dL (ref 6–20)
CHLORIDE: 104 mmol/L (ref 101–111)
CO2: 28 mmol/L (ref 22–32)
Calcium: 9.5 mg/dL (ref 8.9–10.3)
Creatinine, Ser: 0.79 mg/dL (ref 0.61–1.24)
GFR calc Af Amer: >60 mL/min (ref >60)
GFR calc non Af Amer: >60 mL/min (ref >60)
GLUCOSE: 118 mg/dL — AB (ref 65–99)
POTASSIUM: 3.9 mmol/L (ref 3.5–5.1)
SODIUM: 138 mmol/L (ref 135–145)

## 2015-12-21 LAB — POCT ACTIVATED CLOTTING TIME: ACTIVATED CLOTTING TIME: 415 s

## 2015-12-21 MED ORDER — HYDROCHLOROTHIAZIDE 25 MG PO TABS: 25.0000 mg | ORAL_TABLET | Freq: Every day | ORAL | Status: DC

## 2015-12-21 MED ORDER — ENALAPRIL MALEATE 10 MG PO TABS
10.0000 mg | ORAL_TABLET | Freq: Every day | ORAL | Status: DC
  Filled 2015-12-21: qty 1

## 2015-12-21 MED ORDER — CLOPIDOGREL BISULFATE 75 MG PO TABS: 75.0000 mg | ORAL_TABLET | Freq: Every day | ORAL | Status: DC

## 2015-12-21 MED ORDER — ENALAPRIL MALEATE 5 MG PO TABS
5.0000 mg | ORAL_TABLET | Freq: Every day | ORAL | Status: DC
  Filled 2015-12-21: qty 1

## 2015-12-21 MED ORDER — NITROGLYCERIN 0.4 MG SL SUBL: 0.4000 mg | SUBLINGUAL_TABLET | SUBLINGUAL | Status: AC | PRN

## 2015-12-21 MED ORDER — HYDROCHLOROTHIAZIDE 12.5 MG PO CAPS: 12.5000 mg | ORAL_CAPSULE | Freq: Every day | ORAL | Status: DC

## 2015-12-21 MED FILL — Nitroglycerin IV Soln 100 MCG/ML in D5W: INTRA_ARTERIAL | Qty: 10 | Status: AC

## 2015-12-21 MED FILL — Clopidogrel Bisulfate Tab 75 MG (Base Equiv): ORAL | Qty: 1 | Status: AC

## 2015-12-21 NOTE — Progress Notes (Signed)
TR BAND REMOVAL  LOCATION:    right radial  DEFLATED PER PROTOCOL:    Yes.    TIME BAND OFF / DRESSING APPLIED:    2115    SITE UPON ARRIVAL:    Level 0  SITE AFTER BAND REMOVAL:    Level 0  CIRCULATION SENSATION AND MOVEMENT:    Within Normal Limits   Yes.    COMMENTS:   the patient tolerated well. No oozing, pulses are strong, capillary refill < 3 seconds, verbalizes understanding of right wrist precautions.

## 2015-12-21 NOTE — Progress Notes (Signed)
CARDIAC REHAB PHASE I   PRE:  Rate/Rhythm: 81 SR  BP:  Sitting: 147/64        SaO2: 100 RA  MODE:  Ambulation: 500 ft   POST:  Rate/Rhythm: 89 SR  BP:  Sitting: 160/81         SaO2: 98 RA  Pt ambulated 500 ft on RA, independent, steady gait, tolerated well, denies any complaints. Completed PCI/stent education with pt and wife at bedside.  Reviewed risk factors, anti-platelet therapy, stent card, activity restrictions, ntg, exercise, heart healthy diet, portion control, and phase 2 cardiac rehab. Pt verbalized understanding, receptive to education. Pt agrees to phase 2 cardiac rehab referral, will send to [facility] per pt request. Pt to bed per pt request after walk, call bell within reach.  [id number] Lenna Sciara, RN, BSN 12/21/2015 9:34 AM

## 2015-12-21 NOTE — Discharge Summary (Signed)
Discharge Summary    Patient ID: Kevin Stewart,  MRN: [id number], DOB/AGE: 1940-12-14 75 y.o.  Admit date: 12/20/2015 Discharge date: 12/21/2015  Primary Care Provider: [facility] Primary Cardiologist: Dr. [name]  Discharge Diagnoses    Principal Problem:   Angina pectoris [facility]) Active Problems:   Coronary artery disease   Hypertension   Hyperlipemia   Abnormal stress test   Pre-diabetes   Diagnostic Studies/Procedures    1. Cardiac catheterization this admission, please see full report and below for summary. _____________   History of Present Illness & Hospital Course    [name] is a 42M with CAD (light MI 2003 s/p DES to prox LAD in [facility]), HTN, HLD, pre-DM who presented for elective LHC due to angina and abnormal ETT. He recently saw Dr. [name] in order to get re-established with cardiology for life insurance policy reasons. He has been on ASA & Plavix since prior event. At recent visit he reported some chest pain with activity. ETT was positive thus LHC was arranged. He was brought in yesterday for this demonstrating 99% stenosis prox to the previously placed LAD stent s/p CBA and DES placement, dominant RCA with minimal luminal irreg, patent LCX/LM, normal LV & LVEDP. The plan was to continue ASA and Plavix. BP was variable ([id number] systolic) thus no med changes were made. Will f/u BP at next office visit. He tolerated procedure without complication. Dr. Angelena Form has seen and examined the patient today and feels he is stable for discharge.  Of note the patient's med rec was corrected prior to d/c - he is in fact taking 1 whole tablet of enalapril/HCTZ rather than half as reported in previous admission. This dose was continued as inpatient.  Consultants: N/A _____________  Discharge Vitals Blood pressure 99/60, pulse 66, temperature 97.6 F (36.4 C), temperature source Oral, resp. rate 11, height 5\' 11"  (1.803 m), weight 171 lb 15.3 oz (78 kg), SpO2 98  %.  Filed Weights   12/20/15 1243 12/21/15 0541  Weight: 170 lb (77.111 kg) 171 lb 15.3 oz (78 kg)    Labs & Radiologic Studies    CBC  Recent Labs  12/19/15 1040 12/20/15 1840 12/21/15 0556  WBC 7.7 8.5 9.5  NEUTROABS 5390  --   --   HGB 14.7 13.7 13.8  HCT 42.3 40.1 40.7  MCV 88.9 87.6 87.0  PLT 216 201 [id number]   Basic Metabolic Panel  Recent Labs  12/19/15 1040 12/20/15 1840 12/21/15 0556  NA 139  --  138  K 4.1  --  3.9  CL 104  --  104  CO2 25  --  28  GLUCOSE 95  --  118*  BUN 23  --  11  CREATININE 0.77 0.75 0.79  CALCIUM 9.8  --  9.5  _____________  Dg Chest 2 View  12/19/2015  CLINICAL DATA:  Preoperative study prior to coronary stent placement. The patient is currently asymptomatic. EXAM: CHEST  2 VIEW COMPARISON:  None in PACs FINDINGS: The lungs are adequately inflated and clear. Nipple shadows are visible bilaterally. The heart and pulmonary vascularity are normal. The mediastinum is normal in width. There is no pleural effusion. The bony thorax is unremarkable. IMPRESSION: There is no active cardiopulmonary disease. Electronically Signed   By: David  [name] M.D.   On: 12/19/2015 14:42   Disposition   Pt is being discharged home today in good condition.  Follow-up Plans & Appointments    Follow-up Information    Follow  up with [facility].   Specialty:  Cardiology   Why:  [facility] - follow-up 01/09/16 at 10:30am with Rosaria Ferries PA-C, one of Dr. Doug Sou PAs.   Contact information:   [address] [place] [phone]     Discharge Instructions    Amb Referral to Cardiac Rehabilitation    Complete by:  As directed   Diagnosis:  Coronary Stents     Diet - low sodium heart healthy    Complete by:  As directed      Increase activity slowly    Complete by:  As directed   No driving for 2 days. No lifting over 5 lbs for 1 week. No sexual activity for 1 week. You may return to  work in 1 week if applicable. Keep procedure site clean & dry. If you notice increased pain, swelling, bleeding or pus, call/return!  You may shower, but no soaking baths/hot tubs/pools for 1 week.           Discharge Medications   Current Discharge Medication List    START taking these medications   Details  nitroGLYCERIN (NITROSTAT) 0.4 MG SL tablet Place 1 tablet (0.4 mg total) under the tongue every 5 (five) minutes as needed for chest pain (up to 3 doses). Qty: 25 tablet, Refills: 3      CONTINUE these medications which have NOT CHANGED   Details  aspirin 81 MG tablet Take 81 mg by mouth daily.    carvedilol (COREG) 12.5 MG tablet Take 12.5 mg by mouth 2 (two) times daily with a meal.    clopidogrel (PLAVIX) 75 MG tablet Take 75 mg by mouth daily.    enalapril-hydrochlorothiazide (VASERETIC) 10-25 MG per tablet Take 1 tablet by mouth daily.     ezetimibe-simvastatin (VYTORIN) 10-40 MG per tablet Take 1 tablet by mouth at bedtime.    naphazoline-pheniramine (EYE ALLERGY RELIEF) 0.025-0.3 % ophthalmic solution Place 2 drops into both eyes 2 (two) times daily.    Omega-3 Fatty Acids (OMEGA-3 IQ PO) Take 1 tablet by mouth daily.         Allergies:  Allergies  Allergen Reactions  . Shrimp [Shellfish Allergy] Nausea And Vomiting     Outstanding Labs/Studies   NA  Duration of Discharge Encounter   Greater than 30 minutes including physician time.  Signed, Nedra Hai Dunn PA-C 12/21/2015, 11:06 AM

## 2015-12-21 NOTE — Progress Notes (Signed)
Patient: Kevin Stewart / Admit Date: 12/20/2015 / Date of Encounter: 12/21/2015, 8:48 AM   Subjective: Doing well post- PCI. No CP or SOB.  Objective: Telemetry: NSR occ PACs Physical Exam: Blood pressure 99/60, pulse 66, temperature 97.6 F (36.4 C), temperature source Oral, resp. rate 11, height 5\' 11"  (1.803 m), weight 171 lb 15.3 oz (78 kg), SpO2 98 %. General: Well developed, well nourished WM, in no acute distress. Head: Normocephalic, atraumatic, sclera non-icteric, no xanthomas, nares are without discharge. Neck: Negative for carotid bruits. JVP not elevated. Lungs: Clear bilaterally to auscultation without wheezes, rales, or rhonchi. Breathing is unlabored. Heart: RRR S1 S2 without murmurs, rubs, or gallops.  Abdomen: Soft, non-tender, non-distended with normoactive bowel sounds. No rebound/guarding. Extremities: No clubbing or cyanosis. No edema. Distal pedal pulses are 2+ and equal bilaterally. Right radial cath site without hematoma or ecchymosis; good pulse. Neuro: Alert and oriented X 3. Moves all extremities spontaneously. Psych:  Responds to questions appropriately with a normal affect.   Intake/Output Summary (Last 24 hours) at 12/21/15 0848 Last data filed at 12/21/15 0540  Gross per 24 hour  Intake 641.93 ml  Output   1200 ml  Net -558.07 ml    Inpatient Medications:  . aspirin  81 mg Oral Daily  . carvedilol  12.5 mg Oral BID WC  . clopidogrel  75 mg Oral Daily  . enalapril  10 mg Oral Daily   And  . hydrochlorothiazide  25 mg Oral Daily  . ezetimibe-simvastatin  1 tablet Oral QHS  . heparin  5,000 Units Subcutaneous Q8H  . sodium chloride flush  3 mL Intravenous Q12H   Infusions:    Labs:  Recent Labs  12/19/15 1040 12/20/15 1840 12/21/15 0556  NA 139  --  138  K 4.1  --  3.9  CL 104  --  104  CO2 25  --  28  GLUCOSE 95  --  118*  BUN 23  --  11  CREATININE 0.77 0.75 0.79  CALCIUM 9.8  --  9.5   No results for input(s): AST, ALT,  ALKPHOS, BILITOT, PROT, ALBUMIN in the last 72 hours.  Recent Labs  12/19/15 1040 12/20/15 1840 12/21/15 0556  WBC 7.7 8.5 9.5  NEUTROABS 5390  --   --   HGB 14.7 13.7 13.8  HCT 42.3 40.1 40.7  MCV 88.9 87.6 87.0  PLT 216 201 209   No results for input(s): CKTOTAL, CKMB, TROPONINI in the last 72 hours. Invalid input(s): POCBNP No results for input(s): HGBA1C in the last 72 hours.   Radiology/Studies:  Dg Chest 2 View  12/19/2015  CLINICAL DATA:  Preoperative study prior to coronary stent placement. The patient is currently asymptomatic. EXAM: CHEST  2 VIEW COMPARISON:  None in PACs FINDINGS: The lungs are adequately inflated and clear. Nipple shadows are visible bilaterally. The heart and pulmonary vascularity are normal. The mediastinum is normal in width. There is no pleural effusion. The bony thorax is unremarkable. IMPRESSION: There is no active cardiopulmonary disease. Electronically Signed   By: David  [name] M.D.   On: 12/19/2015 14:42     Assessment and Plan  69M with CAD (light MI 2003 s/p DES to prox LAD in [facility]), HTN, HLD, pre-DM who presented for elective LHC due to angina and abnormal ETT. [facility] 12/20/15: 99% stenosis prox to the previously plaecd LAD stent s/p CBA and DES placement, dominant RCA with minimal luminal irreg, patent LCX/LM, normal LV & LVEDP.  1. CAD with recent [place] as above - doing well post-PCI. Continue ASA, Plavix, BB, statin.  2. HTN - BP variable, slightly decreased this AM. F/u with cardiac rehab values. If it remains low we can consider dropping HCTZ. 3. HLD - continue Vytorin. 4. Pre-DM - f/u PCP.  Signed, Melina Copa PA-C Pager: [phone]   I have personally seen and examined this patient with Melina Copa, PA-C. I agree with the assessment and plan as outlined above. He is doing well s/p DES LAD. No chest pain this am. Ambulating without trouble. Continue ASA, Plavix. D/C home today. Follow up with Dr. [name].   Lauree Chandler 12/21/2015 9:24 AM

## 2015-12-22 ENCOUNTER — Telehealth: Payer: Self-pay | Admitting: Physician Assistant

## 2015-12-22 NOTE — Telephone Encounter (Signed)
New message    The pt is having some dull pain from where stents were put in. The pt wants to speak a nurse.

## 2015-12-22 NOTE — Telephone Encounter (Signed)
Spoke with pt, he was told after his stent that he would have some chest discomfort. He has a small dull pain in his chest this morning, no one told him how long it would last. Discussed with dr [name], pt was told to expect it to last for another week. Patient voiced understanding if he is still having problems by the end of next week to let us know.

## 2016-01-09 ENCOUNTER — Ambulatory Visit (INDEPENDENT_AMBULATORY_CARE_PROVIDER_SITE_OTHER): Payer: Medicare Other | Admitting: Physician Assistant

## 2016-01-09 ENCOUNTER — Encounter: Payer: Self-pay | Admitting: Physician Assistant

## 2016-01-09 VITALS — BP 154/80 | HR 61 | Ht 71.0 in | Wt 176.4 lb

## 2016-01-09 DIAGNOSIS — I10 Essential (primary) hypertension: Secondary | ICD-10-CM | POA: Diagnosis not present

## 2016-01-09 DIAGNOSIS — I209 Angina pectoris, unspecified: Secondary | ICD-10-CM

## 2016-01-09 NOTE — Progress Notes (Signed)
Cardiology Office Note   Date:  01/09/2016   ID:  [name], DOB 31-Oct-1940, MRN [id number]  PCP:  [name], MD  Cardiologist:  Dr [name]  Stewart, Rhonda, PA-C   Chief Complaint  Patient presents with  . Follow-up    Cath , stents placed, pt c/o chest pain after cath take nitro 2 times since procedure    History of Present Illness:  Kevin Stewart is a 75 y.o. male with a history of MI 2003 s/p DES to prox LAD in [place], HTN, HLD, pre-DM.   D/c 06/01 after admit for CP, done for abnl ETT. Cath w/ 99% pLAD, s/p 3.5 x 15 mm long Resolute Integrity DES.There was reduced flow and a small septal perforator noted after the stent was inflated. However the diagonal branch had good flow. EF 55-60 percent  Kevin Stewart presents for Post hospital follow-up  Since discharge from the hospital, he has had 2 episodes of chest pain. He describes the pain as a dull ache and a 3 or 4/10. It started without exertion. He was told by the M.D. to expect this pain, so it did not upset him. Each episode resolved with one sublingual nitroglycerin. There was no significant shortness of breath, nausea, vomiting or diaphoresis.   He has been compliant with the aspirin and Plavix. He is not having any problems with the carvedilol or his other medications. He has an appointment to start cardiac rehabilitation tomorrow. He is increasing his activity and had some increased dyspnea on exertion and at first when walking up a hill, but this has improved. He likes to do yard work including using a Risk manager and a post-hole differ, and walks for exercise once a week.   Past Medical History  Diagnosis Date  . Hypertension   . Coronary artery disease     a. 2003 - light MI per patient; s/p Cypher DES to prox LAD in [facility].  . Hyperlipemia   . Wears glasses   . Wears dentures     upper-lower partial  . Hx of adenomatous colonic polyps   . Pre-diabetes   . Kidney stones    "passed them"  . Basal cell carcinoma, face   . Melanoma of right side of neck ([place])     removed posterior neck 12/312013  . Myocardial infarction ([place])   . Migraine     "stopped in 2001 when I had my heart attack"  . Arthritis     "hands" (12/20/2015)  . Hyperlipidemia     Past Surgical History  Procedure Laterality Date  . Cataract extraction w/ intraocular lens  implant, bilateral Bilateral 2011  . Colonoscopy    . Melanoma excision with sentinel lymph node biopsy  07/21/2012    Procedure: MELANOMA EXCISION WITH SENTINEL LYMPH NODE BIOPSY;  Surgeon: Rozetta Nunnery, MD;  Location: [place];  Service: General;  Laterality: N/A;  melanoma excision with sentinel node biopsy   posterior right neck  . Tonsillectomy  1961  . Hemorrhoid surgery      "burned them off"  . Coronary angioplasty with stent placement  2001    "1 stent"  . Coronary angioplasty with stent placement  12/20/2015    "1 stent"  . Cardiac catheterization N/A 12/20/2015    Procedure: Left Heart Cath and Coronary Angiography;  Surgeon: Belva Crome, MD;  Location: [place] [facility];  Service: Cardiovascular;  Laterality: N/A;  . Cardiac catheterization N/A 12/20/2015  Procedure: Coronary Stent Intervention;  Surgeon: Belva Crome, MD;  Location: [place] [facility];  Service: Cardiovascular;  Laterality: N/A;    Current Outpatient Prescriptions  Medication Sig Dispense Refill  . aspirin 81 MG tablet Take 81 mg by mouth daily.    . carvedilol (COREG) 12.5 MG tablet Take 12.5 mg by mouth 2 (two) times daily with a meal.    . clopidogrel (PLAVIX) 75 MG tablet Take 75 mg by mouth daily.    . enalapril-hydrochlorothiazide (VASERETIC) 10-25 MG per tablet Take 1 tablet by mouth daily.     Marland Kitchen ezetimibe-simvastatin (VYTORIN) 10-40 MG per tablet Take 1 tablet by mouth at bedtime.    . fluticasone (FLONASE) 50 MCG/ACT nasal spray Place 1 spray into both nostrils as needed.  0  . naphazoline-pheniramine  (EYE ALLERGY RELIEF) 0.025-0.3 % ophthalmic solution Place 2 drops into both eyes 2 (two) times daily.    . nitroGLYCERIN (NITROSTAT) 0.4 MG SL tablet Place 1 tablet (0.4 mg total) under the tongue every 5 (five) minutes as needed for chest pain (up to 3 doses). 25 tablet 3  . Omega-3 Fatty Acids (OMEGA-3 IQ PO) Take 1 tablet by mouth daily.     No current facility-administered medications for this visit.    Allergies:   Shrimp    Social History:  The patient  reports that he quit smoking about 42 years ago. His smoking use included Cigarettes. He has a 20 pack-year smoking history. He has quit using smokeless tobacco. His smokeless tobacco use included Chew. He reports that he drinks alcohol. He reports that he does not use illicit drugs.   Family History:  The patient's family history includes Cancer - Colon (age of onset: 40) in his brother; Cancer - Other (age of onset: 37) in his father; Cancer - Other (age of onset: 50) in his sister; Heart attack (age of onset: 32) in his mother; Heart attack (age of onset: 26) in his brother.    ROS:  Please see the history of present illness. All other systems are reviewed and negative.    PHYSICAL EXAM: VS:  BP 154/80 mmHg  Pulse 61  Ht 5\' 11"  (1.803 m)  Wt 176 lb 6.4 oz (80.015 kg)  BMI 24.61 kg/m2 , BMI Body mass index is 24.61 kg/(m^2). GEN: Well nourished, well developed, male in no acute distress HEENT: normal for age  Neck: no JVD, no carotid bruit, no masses Cardiac: RRR; soft murmur, no rubs, or gallops Respiratory:  clear to auscultation bilaterally, normal work of breathing GI: soft, nontender, nondistended, + BS MS: no deformity or atrophy; no edema; distal pulses are 2+ in all 4 extremities  Skin: warm and dry, no rash Neuro:  Strength and sensation are intact Psych: euthymic mood, full affect   EKG:  EKG is not ordered today.  CATH:  99% stenosis prox to the previously placed LAD stent s/p CBA and DES placement, dominant  RCA with minimal luminal irreg, patent LCX/LM, normal LV & LVEDP  Recent Labs: 12/21/2015: BUN 11; Creatinine, Ser 0.79; Hemoglobin 13.8; Platelets 209; Potassium 3.9; Sodium 138    Lipid Panel No results found for: CHOL, TRIG, HDL, CHOLHDL, VLDL, LDLCALC, LDLDIRECT   Wt Readings from Last 3 Encounters:  01/09/16 176 lb 6.4 oz (80.015 kg)  12/21/15 171 lb 15.3 oz (78 kg)  11/27/15 176 lb 6 oz (80.003 kg)     Other studies Reviewed: Additional studies/ records that were reviewed today include: Office notes, hospital records  and testing.  ASSESSMENT AND PLAN:  1.  CAD: He had a couple of episodes of chest pain, likely from the small septal perforator. He and his wife were reassured that this did not indicate any problems with the stent. He is encouraged to do cardiovascular exercise 3 or more times a week and follow up with cardiac rehabilitation. He says he will do this. He is compliant with his aspirin, carvedilol, Plavix, ACE/diuretic and Vytorin.  2. Hypertension: His blood pressure is up a bit today, but is generally in the 110s-140s.   If he is noted to have hypertension at cardiac rehabilitation, increasing his ACE inhibitor would be a good option. He would not be able to stay on the Vaseretic if we did this, they would have to be split up.   Current medicines are reviewed at length with the patient today.  The patient does not have concerns regarding medicines.  The following changes have been made:  no change  Labs/ tests ordered today include: ECG     Disposition:   FU with Dr. [name]  Signed, Rosaria Ferries, PA-C  01/09/2016 10:54 AM    [place] Phone: [phone]; Fax: [phone]  This note was written with the assistance of speech recognition software. Please excuse any transcriptional errors.

## 2016-01-09 NOTE — Patient Instructions (Signed)
Your physician recommends that you schedule a follow-up appointment in: 3 Months with Dr [name]  Please try and walk three times a week  Call office if chest pain does not go away after 1 Nitroglycerin

## 2016-01-10 ENCOUNTER — Encounter: Payer: Medicare Other | Attending: Cardiology | Admitting: *Deleted

## 2016-01-10 DIAGNOSIS — Z955 Presence of coronary angioplasty implant and graft: Secondary | ICD-10-CM | POA: Insufficient documentation

## 2016-01-10 NOTE — Progress Notes (Signed)
Daily Session Note  Patient Details  Name: SWADE SHONKA MRN: [id number] Date of Birth: Jan 20, 1941 Referring Provider:        Cardiac Rehab from 01/10/2016 in [facility] Cardiac and Pulmonary Rehab   Referring Provider  [name]      Encounter Date: 01/10/2016  Check In:     Session Check In - 01/10/16 1408    Check-In   Staff Present Heath Lark, RN, BSN, Lance Sell, BA, ACSM CEP, Exercise Physiologist   Supervising physician immediately available to respond to emergencies See telemetry face sheet for immediately available ER MD   Medication changes reported     No   Fall or balance concerns reported    No   Warm-up and Cool-down Not performed (comment)   Resistance Training Performed No   VAD Patient? No   Pain Assessment   Currently in Pain? No/denies         Goals Met:  Personal goals reviewed No report of cardiac concerns or symptoms  Goals Unmet:  Not Applicable  Comments: Initial medical review completed today   Dr. Emily Filbert is Medical Director for [facility] and [facility].

## 2016-01-10 NOTE — Progress Notes (Signed)
Cardiac Individual Treatment Plan  Patient Details  Name: Kevin Stewart MRN: [id number] Date of Birth: 20-Nov-1940 Referring Provider:        Cardiac Rehab from 01/10/2016 in [facility] Cardiac and Pulmonary Rehab   Referring Provider  [name]      Initial Encounter Date:       Cardiac Rehab from 01/10/2016 in [facility] Cardiac and Pulmonary Re[facility]   Date  01/10/16   Referring Provider  [name]      Visit Diagnosis: Status post coronary artery stent placement  Patient's Home Medications on Admission:  Current outpatient prescriptions:  .  aspirin 81 MG tablet, Take 81 mg by mouth daily., Disp: , Rfl:  .  carvedilol (COREG) 12.5 MG tablet, Take 12.5 mg by mouth 2 (two) times daily with a meal., Disp: , Rfl:  .  clopidogrel (PLAVIX) 75 MG tablet, Take 75 mg by mouth daily., Disp: , Rfl:  .  enalapril-hydrochlorothiazide (VASERETIC) 10-25 MG per tablet, Take 1 tablet by mouth daily. , Disp: , Rfl:  .  ezetimibe-simvastatin (VYTORIN) 10-40 MG per tablet, Take 1 tablet by mouth at bedtime., Disp: , Rfl:  .  fluticasone (FLONASE) 50 MCG/ACT nasal spray, Place 1 spray into both nostrils as needed., Disp: , Rfl: 0 .  naphazoline-pheniramine (EYE ALLERGY RELIEF) 0.025-0.3 % ophthalmic solution, Place 2 drops into both eyes 2 (two) times daily., Disp: , Rfl:  .  nitroGLYCERIN (NITROSTAT) 0.4 MG SL tablet, Place 1 tablet (0.4 mg total) under the tongue every 5 (five) minutes as needed for chest pain (up to 3 doses)., Disp: 25 tablet, Rfl: 3 .  Omega-3 Fatty Acids (OMEGA-3 IQ PO), Take 1 tablet by mouth daily., Disp: , Rfl:   Past Medical History: Past Medical History  Diagnosis Date  . Hypertension   . Coronary artery disease     a. 2003 - light MI per patient; s/p Cypher DES to prox LAD in [facility].  . Hyperlipemia   . Wears glasses   . Wears dentures     upper-lower partial  . Hx of adenomatous colonic polyps   . Pre-diabetes   . Kidney stones     "passed them"  . Basal cell carcinoma,  face   . Melanoma of right side of neck ([place])     removed posterior neck 12/312013  . Myocardial infarction ([place])   . Migraine     "stopped in 2001 when I had my heart attack"  . Arthritis     "hands" (12/20/2015)  . Hyperlipidemia     Tobacco Use: History  Smoking status  . Former Smoker -- 2.00 packs/day for 10 years  . Types: Cigarettes  . Quit date: 07/10/1973  Smokeless tobacco  . Former Systems developer  . Types: Chew    Comment: "chewed a little; stopped in 06/1973"    Labs: Recent Review Flowsheet Data    Labs for ITP Cardiac and Pulmonary Rehab Latest Ref Rng 07/21/2012   TCO2 0 - 100 mmol/L 27       Exercise Target Goals: Date: 01/10/16  Exercise Program Goal: Individual exercise prescription set with THRR, safety & activity barriers. Participant demonstrates ability to understand and report RPE using BORG scale, to self-measure pulse accurately, and to acknowledge the importance of the exercise prescription.  Exercise Prescription Goal: Starting with aerobic activity 30 plus minutes a day, 3 days per week for initial exercise prescription. Provide home exercise prescription and guidelines that participant acknowledges understanding prior to discharge.  Activity Barriers & Risk  Stratification:     Activity Barriers & Cardiac Risk Stratification - 01/10/16 1240    Activity Barriers & Cardiac Risk Stratification   Activity Barriers Chest Pain/Angina;Shortness of Breath  Angina symptoms continue, MD is aware of symptoms.  Has used NTG to relieve symptoms.    Cardiac Risk Stratification High      6 Minute Walk:     6 Minute Walk      01/10/16 1354       6 Minute Walk   Distance 1600 feet     Walk Time 6 minutes     MPH 3     METS 3.85     RPE 13     VO2 Peak 13.5     Symptoms No     Resting HR 67 bpm     Resting BP 130/64 mmHg     Max Ex. HR 102 bpm     Max Ex. BP 172/76 mmHg        Initial Exercise Prescription:     Initial Exercise Prescription -  01/10/16 1300    Date of Initial Exercise RX and Referring Provider   Date 01/10/16   Referring Provider [name]   Treadmill   MPH 3   Grade 0.5   Minutes 15   METs 3.5   Bike   Level 1.5   Minutes 15   Recumbant Bike   Level 2   RPM 50   Watts 40   Minutes 15   METs 3.5   NuStep   Level 3   Watts 60   Minutes 15   METs 3.5   Elliptical   Level 1   Speed 50   Minutes 5   REL-XR   Level 2   Watts 60   Minutes 15   METs 3.5   T5 Nustep   Level 2   Watts 60   Minutes 15   METs 3.5   Biostep-RELP   Level 3   Watts 60   Minutes 15   METs 3.5   Prescription Details   Frequency (times per week) 3   Duration Progress to 45 minutes of aerobic exercise without signs/symptoms of physical distress   Intensity   THRR 40-80% of Max Heartrate 99-131   Ratings of Perceived Exertion 11-13   Perceived Dyspnea 0-4   Progression   Progression Continue to progress workloads to maintain intensity without signs/symptoms of physical distress.   Resistance Training   Training Prescription Yes   Weight 6   Reps 10-15      Perform Capillary Blood Glucose checks as needed.  Exercise Prescription Changes:     Exercise Prescription Changes      01/10/16 1300           Response to Exercise   Blood Pressure (Admit) 130/64 mmHg       Blood Pressure (Exercise) 172/76 mmHg       Blood Pressure (Exit) 162/82 mmHg       Heart Rate (Admit) 65 bpm       Heart Rate (Exercise) 102 bpm       Heart Rate (Exit) 65 bpm       Oxygen Saturation (Admit) 102 %       Oxygen Saturation (Exercise) 83 %       Perceived Dyspnea (Exercise) 13          Exercise Comments:   Discharge Exercise Prescription (Final Exercise Prescription Changes):     Exercise Prescription Changes - 01/10/16  1300    Response to Exercise   Blood Pressure (Admit) 130/64 mmHg   Blood Pressure (Exercise) 172/76 mmHg   Blood Pressure (Exit) 162/82 mmHg   Heart Rate (Admit) 65 bpm   Heart Rate (Exercise)  102 bpm   Heart Rate (Exit) 65 bpm   Oxygen Saturation (Admit) 102 %   Oxygen Saturation (Exercise) 83 %   Perceived Dyspnea (Exercise) 13      Nutrition:  Target Goals: Understanding of nutrition guidelines, daily intake of sodium 1500mg , cholesterol 200mg , calories 30% from fat and 7% or less from saturated fats, daily to have 5 or more servings of fruits and vegetables.  Biometrics:    Nutrition Therapy Plan and Nutrition Goals:     Nutrition Therapy & Goals - 01/10/16 1236    Intervention Plan   Intervention Prescribe, educate and counsel regarding individualized specific dietary modifications aiming towards targeted core components such as weight, hypertension, lipid management, diabetes, heart failure and other comorbidities.  Prefers not to meet with the RD.Marland Kitchen  HAs had instruction while in the hospital and at MD office.    Expected Outcomes Short Term Goal: Understand basic principles of dietary content, such as calories, fat, sodium, cholesterol and nutrients.;Short Term Goal: A plan has been developed with personal nutrition goals set during dietitian appointment.;Long Term Goal: Adherence to prescribed nutrition plan.      Nutrition Discharge: Rate Your Plate Scores:   Nutrition Goals Re-Evaluation:   Psychosocial: Target Goals: Acknowledge presence or absence of depression, maximize coping skills, provide positive support system. Participant is able to verbalize types and ability to use techniques and skills needed for reducing stress and depression.  Initial Review & Psychosocial Screening:     Initial Psych Review & Screening - 01/10/16 [place]? Yes  Wife   Barriers   Psychosocial barriers to participate in program There are no identifiable barriers or psychosocial needs.;The patient should benefit from training in stress management and relaxation.   Screening Interventions   Interventions Encouraged to exercise       Quality of Life Scores:     Quality of Life - 01/10/16 1415    Quality of Life Scores   Health/Function Pre 28.87 %   Socioeconomic Pre 30 %   Psych/Spiritual Pre 30 %   Family Pre 30 %   GLOBAL Pre 29.5 %      PHQ-9:     Recent Review Flowsheet Data    Depression screen [facility] 2/9 01/10/2016   Decreased Interest 0   Down, Depressed, Hopeless 0   PHQ - 2 Score 0   Altered sleeping 0   Tired, decreased energy 0   Change in appetite 0   Feeling bad or failure about yourself  0   Trouble concentrating 0   Moving slowly or fidgety/restless 0   Suicidal thoughts 0   PHQ-9 Score 0   Difficult doing work/chores Not difficult at all      Psychosocial Evaluation and Intervention:   Psychosocial Re-Evaluation:   Vocational Rehabilitation: Provide vocational rehab assistance to qualifying candidates.   Vocational Rehab Evaluation & Intervention:     Vocational Rehab - 01/10/16 1244    Initial Vocational Rehab Evaluation & Intervention   Assessment shows need for Vocational Rehabilitation No      Education: Education Goals: Education classes will be provided on a weekly basis, covering required topics. Participant will state understanding/return demonstration of topics presented.  Learning Barriers/Preferences:  Learning Barriers/Preferences - 01/10/16 1241    Learning Barriers/Preferences   Learning Barriers None   Learning Preferences Individual Instruction      Education Topics: General Nutrition Guidelines/Fats and Fiber: -Group instruction provided by verbal, written material, models and posters to present the general guidelines for heart healthy nutrition. Gives an explanation and review of dietary fats and fiber.   Controlling Sodium/Reading Food Labels: -Group verbal and written material supporting the discussion of sodium use in heart healthy nutrition. Review and explanation with models, verbal and written materials for utilization of the food  label.   Exercise Physiology & Risk Factors: - Group verbal and written instruction with models to review the exercise physiology of the cardiovascular system and associated critical values. Details cardiovascular disease risk factors and the goals associated with each risk factor.   Aerobic Exercise & Resistance Training: - Gives group verbal and written discussion on the health impact of inactivity. On the components of aerobic and resistive training programs and the benefits of this training and how to safely progress through these programs.   Flexibility, Balance, General Exercise Guidelines: - Provides group verbal and written instruction on the benefits of flexibility and balance training programs. Provides general exercise guidelines with specific guidelines to those with heart or lung disease. Demonstration and skill practice provided.   Stress Management: - Provides group verbal and written instruction about the health risks of elevated stress, cause of high stress, and healthy ways to reduce stress.   Depression: - Provides group verbal and written instruction on the correlation between heart/lung disease and depressed mood, treatment options, and the stigmas associated with seeking treatment.   Anatomy & Physiology of the Heart: - Group verbal and written instruction and models provide basic cardiac anatomy and physiology, with the coronary electrical and arterial systems. Review of: AMI, Angina, Valve disease, Heart Failure, Cardiac Arrhythmia, Pacemakers, and the ICD.   Cardiac Procedures: - Group verbal and written instruction and models to describe the testing methods done to diagnose heart disease. Reviews the outcomes of the test results. Describes the treatment choices: Medical Management, Angioplasty, or Coronary Bypass Surgery.   Cardiac Medications: - Group verbal and written instruction to review commonly prescribed medications for heart disease. Reviews the  medication, class of the drug, and side effects. Includes the steps to properly store meds and maintain the prescription regimen.   Go Sex-Intimacy & Heart Disease, Get SMART - Goal Setting: - Group verbal and written instruction through game format to discuss heart disease and the return to sexual intimacy. Provides group verbal and written material to discuss and apply goal setting through the application of the S.M.A.R.T. Method.   Other Matters of the Heart: - Provides group verbal, written materials and models to describe Heart Failure, Angina, Valve Disease, and Diabetes in the realm of heart disease. Includes description of the disease process and treatment options available to the cardiac patient.   Exercise & Equipment Safety: - Individual verbal instruction and demonstration of equipment use and safety with use of the equipment.          Cardiac Rehab from 01/10/2016 in [facility] Cardiac and Pulmonary Rehab   Date  01/10/16   Educator  SB   Instruction Review Code  2- meets goals/outcomes      Infection Prevention: - Provides verbal and written material to individual with discussion of infection control including proper hand washing and proper equipment cleaning during exercise session.      Cardiac Rehab from 01/10/2016 in  [facility] [facility]iac and Pulmonary Rehab   Date  01/10/16   Educator  SB   Instruction Review Code  2- meets goals/outcomes      Falls Prevention: - Provides verbal and written material to individual with discussion of falls prevention and safety.      Cardiac Rehab from 01/10/2016 in [facility] Cardiac and Pulmonary Rehab   Date  01/10/16   Educator  SB   Instruction Review Code  2- meets goals/outcomes      Diabetes: - Individual verbal and written instruction to review signs/symptoms of diabetes, desired ranges of glucose level fasting, after meals and with exercise. Advice that pre and post exercise glucose checks will be done for 3 sessions at entry of  program.    Knowledge Questionnaire Score:     Knowledge Questionnaire Score - 01/10/16 1241    Knowledge Questionnaire Score   Pre Score 20/28      Core Components/Risk Factors/Patient Goals at Admission:     Personal Goals and Risk Factors at Admission - 01/10/16 1247    Core Components/Risk Factors/Patient Goals on Admission   Hypertension Yes   Intervention Provide education on lifestyle modifcations including regular physical activity/exercise, weight management, moderate sodium restriction and increased consumption of fresh fruit, vegetables, and low fat dairy, alcohol moderation, and smoking cessation.;Monitor prescription use compliance.   Expected Outcomes Short Term: Continued assessment and intervention until BP is < 140/43mm HG in hypertensive participants. < 130/39mm HG in hypertensive participants with diabetes, heart failure or chronic kidney disease.;Long Term: Maintenance of blood pressure at goal levels.      Core Components/Risk Factors/Patient Goals Review:    Core Components/Risk Factors/Patient Goals at Discharge (Final Review):    ITP Comments:     ITP Comments      01/10/16 1416           ITP Comments Initial medica review completed. ITP created.  Documentation of diagnosis found in [facility] discharge summary 12/21/2015          Comments:

## 2016-01-10 NOTE — Patient Instructions (Signed)
Patient Instructions  Patient Details  Name: Kevin Stewart MRN: [id number] Date of Birth: Jul 25, 1940 Referring Provider:  [name], Peter M, MD  Below are the personal goals you chose as well as exercise and nutrition goals. Our goal is to help you keep on track towards obtaining and maintaining your goals. We will be discussing your progress on these goals with you throughout the program.  Initial Exercise Prescription:     Initial Exercise Prescription - 01/10/16 1300    Date of Initial Exercise RX and Referring Provider   Date 01/10/16   Referring Provider [name]   Treadmill   MPH 3   Grade 0.5   Minutes 15   METs 3.5   Bike   Level 1.5   Minutes 15   Recumbant Bike   Level 2   RPM 50   Watts 40   Minutes 15   METs 3.5   NuStep   Level 3   Watts 60   Minutes 15   METs 3.5   Elliptical   Level 1   Speed 50   Minutes 5   REL-XR   Level 2   Watts 60   Minutes 15   METs 3.5   T5 Nustep   Level 2   Watts 60   Minutes 15   METs 3.5   Biostep-RELP   Level 3   Watts 60   Minutes 15   METs 3.5   Prescription Details   Frequency (times per week) 3   Duration Progress to 45 minutes of aerobic exercise without signs/symptoms of physical distress   Intensity   THRR 40-80% of Max Heartrate 99-131   Ratings of Perceived Exertion 11-13   Perceived Dyspnea 0-4   Progression   Progression Continue to progress workloads to maintain intensity without signs/symptoms of physical distress.   Resistance Training   Training Prescription Yes   Weight 6   Reps 10-15      Exercise Goals: Frequency: Be able to perform aerobic exercise three times per week working toward 3-5 days per week.  Intensity: Work with a perceived exertion of 11 (fairly light) - 15 (hard) as tolerated. Follow your new exercise prescription and watch for changes in prescription as you progress with the program. Changes will be reviewed with you when they are made.  Duration: You should be able  to do 30 minutes of continuous aerobic exercise in addition to a 5 minute warm-up and a 5 minute cool-down routine.  Nutrition Goals: Your personal nutrition goals will be established when you do your nutrition analysis with the dietician.  The following are nutrition guidelines to follow: Cholesterol < 200mg /day Sodium < 1500mg /day Fiber: Men over 50 yrs - 30 grams per day  Personal Goals:     Personal Goals and Risk Factors at Admission - 01/10/16 1247    Core Components/Risk Factors/Patient Goals on Admission   Hypertension Yes   Intervention Provide education on lifestyle modifcations including regular physical activity/exercise, weight management, moderate sodium restriction and increased consumption of fresh fruit, vegetables, and low fat dairy, alcohol moderation, and smoking cessation.;Monitor prescription use compliance.   Expected Outcomes Short Term: Continued assessment and intervention until BP is < 140/11mm HG in hypertensive participants. < 130/50mm HG in hypertensive participants with diabetes, heart failure or chronic kidney disease.;Long Term: Maintenance of blood pressure at goal levels.      Tobacco Use Initial Evaluation: History  Smoking status  . Former Smoker -- 2.00 packs/day for 10 years  .  Types: Cigarettes  . Quit date: 07/10/1973  Smokeless tobacco  . Former Systems developer  . Types: Chew    Comment: "chewed a little; stopped in 06/1973"    Copy of goals given to participant.

## 2016-01-11 ENCOUNTER — Encounter: Payer: Medicare Other | Admitting: *Deleted

## 2016-01-11 DIAGNOSIS — Z955 Presence of coronary angioplasty implant and graft: Secondary | ICD-10-CM

## 2016-01-11 NOTE — Progress Notes (Signed)
Daily Session Note  Patient Details  Name: Kevin Stewart MRN: [id number] Date of Birth: [birth year] Referring Provider:        Cardiac Rehab from 01/10/2016 in [facility] Cardiac and Pulmonary Rehab   Referring Provider  [name]      Encounter Date: 01/11/2016  Check In:     Session Check In - 01/11/16 1710    Check-In   Location [facility]-Cardiac & Pulmonary Rehab   Staff Present Gerlene Burdock, RN, Jana Half, RN, Vickki Hearing, BA, ACSM CEP, Exercise Physiologist   Supervising physician immediately available to respond to emergencies See telemetry face sheet for immediately available ER MD   Medication changes reported     No   Fall or balance concerns reported    No   Warm-up and Cool-down Performed on first and last piece of equipment   Resistance Training Performed Yes   VAD Patient? No   Pain Assessment   Currently in Pain? No/denies         Goals Met:  Proper associated with RPD/PD & O2 Sat Exercise tolerated well No report of cardiac concerns or symptoms  Goals Unmet:  Not Applicable  Comments:     Dr. Emily Filbert is Medical Director for [place] and [facility].

## 2016-01-12 ENCOUNTER — Encounter: Payer: Self-pay | Admitting: Dietician

## 2016-01-15 DIAGNOSIS — Z955 Presence of coronary angioplasty implant and graft: Secondary | ICD-10-CM | POA: Diagnosis not present

## 2016-01-15 NOTE — Progress Notes (Signed)
Daily Session Note  Patient Details  Name: Kevin Stewart MRN: [id number] Date of Birth: Mar 15, 1941 Referring Provider:        Cardiac Rehab from 01/10/2016 in [facility] Cardiac and Pulmonary Rehab   Referring Provider  [name]      Encounter Date: 01/15/2016  Check In:     Session Check In - 01/15/16 1625    Check-In   Location [facility]-Cardiac & Pulmonary Rehab   Staff Present Earlean Shawl, BS, ACSM CEP, Exercise Physiologist;Bailen Geffre Oletta Darter, BA, ACSM CEP, Exercise Physiologist;Diane Joya Gaskins, RN, BSN   Supervising physician immediately available to respond to emergencies See telemetry face sheet for immediately available ER MD   Medication changes reported     No   Fall or balance concerns reported    No   Warm-up and Cool-down Performed on first and last piece of equipment   Resistance Training Performed Yes   VAD Patient? No   Pain Assessment   Currently in Pain? No/denies   Multiple Pain Sites No         Goals Met:  Independence with exercise equipment Exercise tolerated well No report of cardiac concerns or symptoms Strength training completed today  Goals Unmet:  Not Applicable  Comments: Pt able to follow exercise prescription today without complaint.  Will continue to monitor for progression.    Dr. Emily Filbert is Medical Director for [place] and [facility].

## 2016-01-17 ENCOUNTER — Encounter: Payer: Medicare Other | Admitting: *Deleted

## 2016-01-17 DIAGNOSIS — Z955 Presence of coronary angioplasty implant and graft: Secondary | ICD-10-CM | POA: Diagnosis not present

## 2016-01-17 NOTE — Progress Notes (Signed)
Daily Session Note  Patient Details  Name: Kevin Stewart MRN: [id number] Date of Birth: 30-Nov-1940 Referring Provider:        Cardiac Rehab from 01/10/2016 in [facility] Cardiac and Pulmonary Rehab   Referring Provider  [name]      Encounter Date: 01/17/2016  Check In:     Session Check In - 01/17/16 1634    Check-In   Location [facility]-Cardiac & Pulmonary Rehab   Staff Present Heath Lark, RN, BSN, Lance Sell, BA, ACSM CEP, Exercise Physiologist;Diane Joya Gaskins, RN, BSN   Supervising physician immediately available to respond to emergencies See telemetry face sheet for immediately available ER MD   Medication changes reported     No   Fall or balance concerns reported    No   Warm-up and Cool-down Performed on first and last piece of equipment   Resistance Training Performed Yes   VAD Patient? No   Pain Assessment   Currently in Pain? No/denies   Multiple Pain Sites No         Goals Met:  Independence with exercise equipment Exercise tolerated well No report of cardiac concerns or symptoms Strength training completed today  Goals Unmet:  Not Applicable  Comments:  Pt able to follow exercise prescription today without complaint.  Will continue to monitor for progression.    Dr. Emily Filbert is Medical Director for [place] and [facility].

## 2016-01-18 DIAGNOSIS — Z955 Presence of coronary angioplasty implant and graft: Secondary | ICD-10-CM

## 2016-01-18 NOTE — Progress Notes (Signed)
Daily Session Note  Patient Details  Name: PURL CLAYTOR MRN: [id number] Date of Birth: 1941/03/23 Referring Provider:        Cardiac Rehab from 01/10/2016 in [facility] Cardiac and Pulmonary Rehab   Referring Provider  [name]      Encounter Date: 01/18/2016  Check In:     Session Check In - 01/18/16 1651    Check-In   Location [facility]-Cardiac & Pulmonary Rehab   Staff Present Heath Lark, RN, BSN, CCRP;Layia Walla, DPT, Burlene Arnt, BA, ACSM CEP, Exercise Physiologist   Supervising physician immediately available to respond to emergencies See telemetry face sheet for immediately available ER MD   Medication changes reported     No   Fall or balance concerns reported    No   Warm-up and Cool-down Performed on first and last piece of equipment   Resistance Training Performed Yes   VAD Patient? No   Pain Assessment   Currently in Pain? No/denies   Multiple Pain Sites No         Goals Met:  Independence with exercise equipment Exercise tolerated well No report of cardiac concerns or symptoms  Goals Unmet:  Not Applicable  Comments: Patient completed exercise prescription and all exercise goals during rehab session. The exercise was tolerated well and the patient is progressing in the program.   Dr. Emily Filbert is Medical Director for [place] and [facility].

## 2016-01-22 ENCOUNTER — Encounter: Payer: Medicare Other | Attending: Cardiology | Admitting: *Deleted

## 2016-01-22 DIAGNOSIS — Z955 Presence of coronary angioplasty implant and graft: Secondary | ICD-10-CM | POA: Insufficient documentation

## 2016-01-22 NOTE — Progress Notes (Signed)
Daily Session Note  Patient Details  Name: Kevin Stewart MRN: [id number] Date of Birth: 1941-02-27 Referring Provider:        Cardiac Rehab from 01/10/2016 in [facility] Cardiac and Pulmonary Rehab   Referring Provider  [name]      Encounter Date: 01/22/2016  Check In:     Session Check In - 01/22/16 1658    Check-In   Location [facility]-Cardiac & Pulmonary Rehab   Staff Present Earlean Shawl, BS, ACSM CEP, Exercise Physiologist;[place]a Oletta Darter, BA, ACSM CEP, Exercise Physiologist;Diane Joya Gaskins, RN, BSN   Supervising physician immediately available to respond to emergencies See telemetry face sheet for immediately available ER MD   Medication changes reported     No   Fall or balance concerns reported    No   Warm-up and Cool-down Performed on first and last piece of equipment   Resistance Training Performed Yes   VAD Patient? No   Pain Assessment   Currently in Pain? No/denies   Multiple Pain Sites No         Goals Met:  Proper associated with RPD/PD & O2 Sat Independence with exercise equipment Exercise tolerated well No report of cardiac concerns or symptoms Strength training completed today  Goals Unmet:  Not Applicable  Comments: Patient completed exercise prescription and all exercise goals during rehab session. The exercise was tolerated well and the patient is progressing in the program.     Dr. Emily Filbert is Medical Director for [place] and [facility].

## 2016-01-29 DIAGNOSIS — Z955 Presence of coronary angioplasty implant and graft: Secondary | ICD-10-CM

## 2016-01-29 NOTE — Progress Notes (Signed)
Daily Session Note  Patient Details  Name: Kevin Stewart MRN: [id number] Date of Birth: May 30, 1941 Referring Provider:        Cardiac Rehab from 01/10/2016 in [facility] Cardiac and Pulmonary Rehab   Referring Provider  [name]      Encounter Date: 01/29/2016  Check In:     Session Check In - 01/29/16 1619    Check-In   Location [facility]-Cardiac & Pulmonary Rehab   Staff Present Earlean Shawl, BS, ACSM CEP, Exercise Physiologist;[place]a Oletta Darter, BA, ACSM CEP, Exercise Physiologist;Diane Joya Gaskins, RN, BSN   Supervising physician immediately available to respond to emergencies See telemetry face sheet for immediately available ER MD   Medication changes reported     No   Fall or balance concerns reported    No   Warm-up and Cool-down Performed on first and last piece of equipment   Resistance Training Performed Yes   VAD Patient? No   Pain Assessment   Currently in Pain? No/denies   Multiple Pain Sites No         Goals Met:  Independence with exercise equipment Exercise tolerated well No report of cardiac concerns or symptoms Strength training completed today  Goals Unmet:  Not Applicable  Comments: Patient completed exercise prescription and all exercise goals during rehab session. The exercise was tolerated well and the patient is progressing in the program.     Dr. Emily Filbert is Medical Director for [place] and [facility].

## 2016-01-31 DIAGNOSIS — L219 Seborrheic dermatitis, unspecified: Secondary | ICD-10-CM | POA: Diagnosis not present

## 2016-01-31 DIAGNOSIS — L570 Actinic keratosis: Secondary | ICD-10-CM | POA: Diagnosis not present

## 2016-01-31 DIAGNOSIS — L821 Other seborrheic keratosis: Secondary | ICD-10-CM | POA: Diagnosis not present

## 2016-01-31 DIAGNOSIS — D1801 Hemangioma of skin and subcutaneous tissue: Secondary | ICD-10-CM | POA: Diagnosis not present

## 2016-01-31 DIAGNOSIS — B354 Tinea corporis: Secondary | ICD-10-CM | POA: Diagnosis not present

## 2016-01-31 DIAGNOSIS — D235 Other benign neoplasm of skin of trunk: Secondary | ICD-10-CM | POA: Diagnosis not present

## 2016-01-31 DIAGNOSIS — Z85820 Personal history of malignant melanoma of skin: Secondary | ICD-10-CM | POA: Diagnosis not present

## 2016-02-01 ENCOUNTER — Encounter: Payer: Medicare Other | Admitting: *Deleted

## 2016-02-01 DIAGNOSIS — Z955 Presence of coronary angioplasty implant and graft: Secondary | ICD-10-CM

## 2016-02-01 NOTE — Progress Notes (Signed)
Daily Session Note  Patient Details  Name: Kevin Stewart MRN: [id number] Date of Birth: 1941-07-05 Referring Provider:        Cardiac Rehab from 01/10/2016 in [facility] Cardiac and Pulmonary Rehab   Referring Provider  [name]      Encounter Date: 02/01/2016  Check In:     Session Check In - 02/01/16 1616    Check-In   Location [facility]-Cardiac & Pulmonary Rehab   Staff Present Gerlene Burdock, RN, Vickki Hearing, BA, ACSM CEP, Exercise Physiologist   Supervising physician immediately available to respond to emergencies See telemetry face sheet for immediately available ER MD   Medication changes reported     No   Fall or balance concerns reported    No   Warm-up and Cool-down Performed on first and last piece of equipment   Resistance Training Performed Yes   VAD Patient? No   Pain Assessment   Currently in Pain? No/denies         Goals Met:  Proper associated with RPD/PD & O2 Sat Exercise tolerated well  Goals Unmet:  Not Applicable  Comments:     Dr. Emily Filbert is Medical Director for [place] and [facility].

## 2016-02-07 ENCOUNTER — Encounter: Payer: Self-pay | Admitting: *Deleted

## 2016-02-07 DIAGNOSIS — Z955 Presence of coronary angioplasty implant and graft: Secondary | ICD-10-CM

## 2016-02-07 DIAGNOSIS — H43813 Vitreous degeneration, bilateral: Secondary | ICD-10-CM | POA: Diagnosis not present

## 2016-02-07 NOTE — Progress Notes (Signed)
Cardiac Individual Treatment Plan  Patient Details  Name: Kevin Stewart MRN: [id number] Date of Birth: 03-19-1941 Referring Provider:        Cardiac Rehab from 01/10/2016 in [facility] Cardiac and Pulmonary Rehab   Referring Provider  [name]      Initial Encounter Date:       Cardiac Rehab from 01/10/2016 in [facility] Cardiac and Pulmonary Rehab   Date  01/10/16   Referring Provider  [name]      Visit Diagnosis: Status post coronary artery stent placement  Patient's Home Medications on Admission:  Current outpatient prescriptions:  .  aspirin 81 MG tablet, Take 81 mg by mouth daily., Disp: , Rfl:  .  carvedilol (COREG) 12.5 MG tablet, Take 12.5 mg by mouth 2 (two) times daily with a meal., Disp: , Rfl:  .  clopidogrel (PLAVIX) 75 MG tablet, Take 75 mg by mouth daily., Disp: , Rfl:  .  enalapril-hydrochlorothiazide (VASERETIC) 10-25 MG per tablet, Take 1 tablet by mouth daily. , Disp: , Rfl:  .  ezetimibe-simvastatin (VYTORIN) 10-40 MG per tablet, Take 1 tablet by mouth at bedtime., Disp: , Rfl:  .  fluticasone (FLONASE) 50 MCG/ACT nasal spray, Place 1 spray into both nostrils as needed., Disp: , Rfl: 0 .  naphazoline-pheniramine (EYE ALLERGY RELIEF) 0.025-0.3 % ophthalmic solution, Place 2 drops into both eyes 2 (two) times daily., Disp: , Rfl:  .  nitroGLYCERIN (NITROSTAT) 0.4 MG SL tablet, Place 1 tablet (0.4 mg total) under the tongue every 5 (five) minutes as needed for chest pain (up to 3 doses)., Disp: 25 tablet, Rfl: 3 .  Omega-3 Fatty Acids (OMEGA-3 IQ PO), Take 1 tablet by mouth daily., Disp: , Rfl:   Past Medical History: Past Medical History  Diagnosis Date  . Hypertension   . Coronary artery disease     a. 2003 - light MI per patient; s/p Cypher DES to prox LAD in [facility].  . Hyperlipemia   . Wears glasses   . Wears dentures     upper-lower partial  . Hx of adenomatous colonic polyps   . Pre-diabetes   . Kidney stones     "passed them"  . Basal cell carcinoma,  face   . Melanoma of right side of neck ([place])     removed posterior neck [id number]/[id number]  . Myocardial infarction ([place])   . Migraine     "stopped in 2001 when I had my heart attack"  . Arthritis     "hands" (12/20/2015)  . Hyperlipidemia     Tobacco Use: History  Smoking status  . Former Smoker -- 2.00 packs/day for 10 years  . Types: Cigarettes  . Quit date: 07/10/1973  Smokeless tobacco  . Former Systems developer  . Types: Chew    Comment: "chewed a little; stopped in 06/1973"    Labs: Recent Review Flowsheet Data    Labs for ITP Cardiac and Pulmonary Rehab Latest Ref Rng 07/21/2012   TCO2 0 - 100 mmol/L 27       Exercise Target Goals:    Exercise Program Goal: Individual exercise prescription set with THRR, safety & activity barriers. Participant demonstrates ability to understand and report RPE using BORG scale, to self-measure pulse accurately, and to acknowledge the importance of the exercise prescription.  Exercise Prescription Goal: Starting with aerobic activity 30 plus minutes a day, 3 days per week for initial exercise prescription. Provide home exercise prescription and guidelines that participant acknowledges understanding prior to discharge.  Activity Barriers & Risk  Stratification:     Activity Barriers & Cardiac Risk Stratification - 01/10/16 1240    Activity Barriers & Cardiac Risk Stratification   Activity Barriers Chest Pain/Angina;Shortness of Breath  Angina symptoms continue, MD is aware of symptoms.  Has used NTG to relieve symptoms.    Cardiac Risk Stratification High      6 Minute Walk:     6 Minute Walk      01/10/16 1354       6 Minute Walk   Distance 1600 feet     Walk Time 6 minutes     MPH 3     METS 3.85     RPE 13     VO2 Peak 13.5     Symptoms No     Resting HR 67 bpm     Resting BP 130/64 mmHg     Max Ex. HR 102 bpm     Max Ex. BP 172/76 mmHg        Initial Exercise Prescription:     Initial Exercise Prescription - 01/10/16  1300    Date of Initial Exercise RX and Referring Provider   Date 01/10/16   Referring Provider [name]   Treadmill   MPH 3   Grade 0.5   Minutes 15   METs 3.5   Bike   Level 1.5   Minutes 15   Recumbant Bike   Level 2   RPM 50   Watts 40   Minutes 15   METs 3.5   NuStep   Level 3   Watts 60   Minutes 15   METs 3.5   Elliptical   Level 1   Speed 50   Minutes 5   REL-XR   Level 2   Watts 60   Minutes 15   METs 3.5   T5 Nustep   Level 2   Watts 60   Minutes 15   METs 3.5   Biostep-RELP   Level 3   Watts 60   Minutes 15   METs 3.5   Prescription Details   Frequency (times per week) 3   Duration Progress to 45 minutes of aerobic exercise without signs/symptoms of physical distress   Intensity   THRR 40-80% of Max Heartrate 99-131   Ratings of Perceived Exertion 11-13   Perceived Dyspnea 0-4   Progression   Progression Continue to progress workloads to maintain intensity without signs/symptoms of physical distress.   Resistance Training   Training Prescription Yes   Weight 6   Reps 10-15      Perform Capillary Blood Glucose checks as needed.  Exercise Prescription Changes:     Exercise Prescription Changes      01/10/16 1300 01/19/16 1000 01/31/16 1300       Exercise Review   Progression   Yes     Response to Exercise   Blood Pressure (Admit) 130/64 mmHg 142/70 mmHg 142/82 mmHg     Blood Pressure (Exercise) 172/76 mmHg 158/62 mmHg 172/78 mmHg     Blood Pressure (Exit) 162/82 mmHg 124/68 mmHg 146/72 mmHg     Heart Rate (Admit) 65 bpm 62 bpm 63 bpm     Heart Rate (Exercise) 102 bpm 94 bpm 111 bpm     Heart Rate (Exit) 65 bpm 77 bpm 76 bpm     Oxygen Saturation (Admit) 102 %       Oxygen Saturation (Exercise) 83 %       Rating of Perceived Exertion (Exercise)   12  Perceived Dyspnea (Exercise) 13       Symptoms   none     Resistance Training   Training Prescription  Yes Yes     Weight  10 10     Reps  10-15 10-15     Interval Training    Interval Training  No      Treadmill   MPH  3.1 3     Grade  1 4.5     Minutes  22 10     METs  3.8 5.1     NuStep   Level  5 7     Watts  74      Minutes  15 25     METs  3.6 5.3        Exercise Comments:     Exercise Comments      01/19/16 1016 01/31/16 1352         Exercise Comments Kevin Stewart is progressing well with exercise and has completed all sessions with no symtpoms. Kevin Stewart has progressed very well with exercise and maintains his HR within recommended range.         Discharge Exercise Prescription (Final Exercise Prescription Changes):     Exercise Prescription Changes - 01/31/16 1300    Exercise Review   Progression Yes   Response to Exercise   Blood Pressure (Admit) 142/82 mmHg   Blood Pressure (Exercise) 172/78 mmHg   Blood Pressure (Exit) 146/72 mmHg   Heart Rate (Admit) 63 bpm   Heart Rate (Exercise) 111 bpm   Heart Rate (Exit) 76 bpm   Rating of Perceived Exertion (Exercise) 12   Symptoms none   Resistance Training   Training Prescription Yes   Weight 10   Reps 10-15   Treadmill   MPH 3   Grade 4.5   Minutes 10   METs 5.1   NuStep   Level 7   Minutes 25   METs 5.3      Nutrition:  Target Goals: Understanding of nutrition guidelines, daily intake of sodium 1500mg , cholesterol 200mg , calories 30% from fat and 7% or less from saturated fats, daily to have 5 or more servings of fruits and vegetables.  Biometrics:    Nutrition Therapy Plan and Nutrition Goals:     Nutrition Therapy & Goals - 02/01/16 1618    Personal Nutrition Goals   Personal Goal #1 Cont to eat healthy. Prefers to not meet individually with the Cardiac Rehab Registered Dietician.      Nutrition Discharge: Rate Your Plate Scores:     Nutrition Assessments - 01/12/16 1533    Rate Your Plate Scores   Pre Score 69   Pre Score % 77 %      Nutrition Goals Re-Evaluation:   Psychosocial: Target Goals: Acknowledge presence or absence of depression,  maximize coping skills, provide positive support system. Participant is able to verbalize types and ability to use techniques and skills needed for reducing stress and depression.  Initial Review & Psychosocial Screening:     Initial Psych Review & Screening - 01/10/16 [place]? Yes  Wife   Barriers   Psychosocial barriers to participate in program There are no identifiable barriers or psychosocial needs.;The patient should benefit from training in stress management and relaxation.   Screening Interventions   Interventions Encouraged to exercise      Quality of Life Scores:     Quality of Life - 01/10/16 1415  Quality of Life Scores   Health/Function Pre 28.87 %   Socioeconomic Pre 30 %   Psych/Spiritual Pre 30 %   Family Pre 30 %   GLOBAL Pre 29.5 %      PHQ-9:     Recent Review Flowsheet Data    Depression screen [facility] 2/9 01/10/2016   Decreased Interest 0   Down, Depressed, Hopeless 0   PHQ - 2 Score 0   Altered sleeping 0   Tired, decreased energy 0   Change in appetite 0   Feeling bad or failure about yourself  0   Trouble concentrating 0   Moving slowly or fidgety/restless 0   Suicidal thoughts 0   PHQ-9 Score 0   Difficult doing work/chores Not difficult at all      Psychosocial Evaluation and Intervention:   Psychosocial Re-Evaluation:   Vocational Rehabilitation: Provide vocational rehab assistance to qualifying candidates.   Vocational Rehab Evaluation & Intervention:     Vocational Rehab - 01/10/16 1244    Initial Vocational Rehab Evaluation & Intervention   Assessment shows need for Vocational Rehabilitation No      Education: Education Goals: Education classes will be provided on a weekly basis, covering required topics. Participant will state understanding/return demonstration of topics presented.  Learning Barriers/Preferences:     Learning Barriers/Preferences - 01/10/16 1241    Learning  Barriers/Preferences   Learning Barriers None   Learning Preferences Individual Instruction      Education Topics: General Nutrition Guidelines/Fats and Fiber: -Group instruction provided by verbal, written material, models and posters to present the general guidelines for heart healthy nutrition. Gives an explanation and review of dietary fats and fiber.   Controlling Sodium/Reading Food Labels: -Group verbal and written material supporting the discussion of sodium use in heart healthy nutrition. Review and explanation with models, verbal and written materials for utilization of the food label.   Exercise Physiology & Risk Factors: - Group verbal and written instruction with models to review the exercise physiology of the cardiovascular system and associated critical values. Details cardiovascular disease risk factors and the goals associated with each risk factor.   Aerobic Exercise & Resistance Training: - Gives group verbal and written discussion on the health impact of inactivity. On the components of aerobic and resistive training programs and the benefits of this training and how to safely progress through these programs.   Flexibility, Balance, General Exercise Guidelines: - Provides group verbal and written instruction on the benefits of flexibility and balance training programs. Provides general exercise guidelines with specific guidelines to those with heart or lung disease. Demonstration and skill practice provided.   Stress Management: - Provides group verbal and written instruction about the health risks of elevated stress, cause of high stress, and healthy ways to reduce stress.          Cardiac Rehab from 01/29/2016 in [facility] Cardiac and Pulmonary Rehab   Date  01/17/16   Educator  Kathreen Cornfield, [facility]   Instruction Review Code  2- meets goals/outcomes      Depression: - Provides group verbal and written instruction on the correlation between heart/lung disease and  depressed mood, treatment options, and the stigmas associated with seeking treatment.   Anatomy & Physiology of the Heart: - Group verbal and written instruction and models provide basic cardiac anatomy and physiology, with the coronary electrical and arterial systems. Review of: AMI, Angina, Valve disease, Heart Failure, Cardiac Arrhythmia, Pacemakers, and the ICD.      Cardiac Rehab  from 01/29/2016 in [facility] Cardiac and Pulmonary Rehab   Date  01/15/16   Educator  DW   Instruction Review Code  2- meets goals/outcomes      Cardiac Procedures: - Group verbal and written instruction and models to describe the testing methods done to diagnose heart disease. Reviews the outcomes of the test results. Describes the treatment choices: Medical Management, Angioplasty, or Coronary Bypass Surgery.   Cardiac Medications: - Group verbal and written instruction to review commonly prescribed medications for heart disease. Reviews the medication, class of the drug, and side effects. Includes the steps to properly store meds and maintain the prescription regimen.      Cardiac Rehab from 01/29/2016 in [facility] Cardiac and Pulmonary Rehab   Date  01/29/16   Educator  DW   Instruction Review Code  2- meets goals/outcomes      Go Sex-Intimacy & Heart Disease, Get SMART - Goal Setting: - Group verbal and written instruction through game format to discuss heart disease and the return to sexual intimacy. Provides group verbal and written material to discuss and apply goal setting through the application of the S.M.A.R.T. Method.   Other Matters of the Heart: - Provides group verbal, written materials and models to describe Heart Failure, Angina, Valve Disease, and Diabetes in the realm of heart disease. Includes description of the disease process and treatment options available to the cardiac patient.   Exercise & Equipment Safety: - Individual verbal instruction and demonstration of equipment use and safety  with use of the equipment.      Cardiac Rehab from 01/29/2016 in [facility] Cardiac and Pulmonary Rehab   Date  01/11/16   Educator  C. EnterkinRN   Instruction Review Code  2- meets goals/outcomes      Infection Prevention: - Provides verbal and written material to individual with discussion of infection control including proper hand washing and proper equipment cleaning during exercise session.      Cardiac Rehab from 01/29/2016 in [facility] Cardiac and Pulmonary Rehab   Date  01/10/16   Educator  SB   Instruction Review Code  2- meets goals/outcomes      Falls Prevention: - Provides verbal and written material to individual with discussion of falls prevention and safety.      Cardiac Rehab from 01/29/2016 in [facility] Cardiac and Pulmonary Rehab   Date  01/10/16   Educator  SB   Instruction Review Code  2- meets goals/outcomes      Diabetes: - Individual verbal and written instruction to review signs/symptoms of diabetes, desired ranges of glucose level fasting, after meals and with exercise. Advice that pre and post exercise glucose checks will be done for 3 sessions at entry of program.    Knowledge Questionnaire Score:     Knowledge Questionnaire Score - 01/10/16 1241    Knowledge Questionnaire Score   Pre Score 20/28      Core Components/Risk Factors/Patient Goals at Admission:     Personal Goals and Risk Factors at Admission - 01/10/16 1247    Core Components/Risk Factors/Patient Goals on Admission   Hypertension Yes   Intervention Provide education on lifestyle modifcations including regular physical activity/exercise, weight management, moderate sodium restriction and increased consumption of fresh fruit, vegetables, and low fat dairy, alcohol moderation, and smoking cessation.;Monitor prescription use compliance.   Expected Outcomes Short Term: Continued assessment and intervention until BP is < 140/61mm HG in hypertensive participants. < 130/11mm HG in hypertensive  participants with diabetes, heart failure or chronic kidney  disease.;Long Term: Maintenance of blood pressure at goal levels.      Core Components/Risk Factors/Patient Goals Review:      Goals and Risk Factor Review      02/01/16 1645           Core Components/Risk Factors/Patient Goals Review   Personal Goals Review Hypertension;Lipids;Sedentary       Review Kevin Stewart states that he measures BP at home and it has been around 137/70's, last cholesterol check was in April and was 150 total.  He has retuned to work and using a weed eater at home and has done fine with lifting at work.  He states he is pre-diabetic and measures BG at home.  He will follow up with his Dr at next 6 month check up.           Core Components/Risk Factors/Patient Goals at Discharge (Final Review):      Goals and Risk Factor Review - 02/01/16 1645    Core Components/Risk Factors/Patient Goals Review   Personal Goals Review Hypertension;Lipids;Sedentary   Review Kevin Stewart states that he measures BP at home and it has been around 137/70's, last cholesterol check was in April and was 150 total.  He has retuned to work and using a weed eater at home and has done fine with lifting at work.  He states he is pre-diabetic and measures BG at home.  He will follow up with his Dr at next 6 month check up.       ITP Comments:     ITP Comments      01/10/16 1416 02/01/16 1619 02/07/16 0833       ITP Comments Initial medica review completed. ITP created.  Documentation of diagnosis found in [facility] discharge summary 12/21/2015 Cont to eat healthy. Prefers to not meet individually with the Cardiac Rehab Registered Dietician. 30 day review. Continue with ITP        Comments:

## 2016-02-08 ENCOUNTER — Encounter: Payer: Medicare Other | Admitting: *Deleted

## 2016-02-08 DIAGNOSIS — Z955 Presence of coronary angioplasty implant and graft: Secondary | ICD-10-CM | POA: Diagnosis not present

## 2016-02-08 NOTE — Progress Notes (Signed)
Daily Session Note  Patient Details  Name: Kevin Stewart MRN: [id number] Date of Birth: [birth year] Referring Provider:        Cardiac Rehab from 01/10/2016 in [facility] Cardiac and Pulmonary Rehab   Referring Provider  [name]      Encounter Date: 02/08/2016  Check In:     Session Check In - 02/08/16 1627    Check-In   Location [facility]-Cardiac & Pulmonary Rehab   Staff Present Gerlene Burdock, RN, Vickki Hearing, BA, ACSM CEP, Exercise Physiologist   Supervising physician immediately available to respond to emergencies See telemetry face sheet for immediately available ER MD   Medication changes reported     No   Fall or balance concerns reported    No   Warm-up and Cool-down Performed on first and last piece of equipment   Pain Assessment   Currently in Pain? No/denies         Goals Met:  Proper associated with RPD/PD & O2 Sat Exercise tolerated well  Goals Unmet:  Not Applicable  Comments:     Dr. Emily Filbert is Medical Director for [place] and [facility].

## 2016-02-08 NOTE — Addendum Note (Signed)
Addended by: Gerlene Burdock on: 02/08/2016 04:37 PM   Modules accepted: Medications

## 2016-02-12 ENCOUNTER — Encounter: Payer: Medicare Other | Admitting: *Deleted

## 2016-02-12 DIAGNOSIS — Z955 Presence of coronary angioplasty implant and graft: Secondary | ICD-10-CM | POA: Diagnosis not present

## 2016-02-12 NOTE — Progress Notes (Signed)
Daily Session Note  Patient Details  Name: Kevin Stewart MRN: [id number] Date of Birth: 1941/04/26 Referring Provider:   Flowsheet Row Cardiac Rehab from 01/10/2016 in [facility] Cardiac and Pulmonary Rehab  Referring Provider  [name]      Encounter Date: 02/12/2016  Check In:     Session Check In - 02/12/16 1635      Check-In   Location [facility]-Cardiac & Pulmonary Rehab   Staff Present Gerlene Burdock, RN, Moises Blood, BS, ACSM CEP, Exercise Physiologist;Mary Kellie Shropshire, RN, BSN, MA   Supervising physician immediately available to respond to emergencies See telemetry face sheet for immediately available ER MD   Medication changes reported     No   Fall or balance concerns reported    No   Warm-up and Cool-down Performed on first and last piece of equipment   Resistance Training Performed Yes   VAD Patient? No     Pain Assessment   Currently in Pain? No/denies   Multiple Pain Sites No         Goals Met:  Independence with exercise equipment Exercise tolerated well No report of cardiac concerns or symptoms Strength training completed today  Goals Unmet:  Not Applicable  Comments: Patient completed exercise prescription and all exercise goals during rehab session. The exercise was tolerated well and the patient is progressing in the program.     Dr. Emily Filbert is Medical Director for [place] and [facility].

## 2016-02-15 ENCOUNTER — Encounter: Payer: Medicare Other | Admitting: *Deleted

## 2016-02-15 DIAGNOSIS — Z955 Presence of coronary angioplasty implant and graft: Secondary | ICD-10-CM | POA: Diagnosis not present

## 2016-02-15 NOTE — Progress Notes (Signed)
Daily Session Note  Patient Details  Name: Kevin Stewart MRN: [id number] Date of Birth: August 20, 1940 Referring Provider:   Flowsheet Row Cardiac Rehab from 01/10/2016 in [facility] Cardiac and Pulmonary Rehab  Referring Provider  [name]      Encounter Date: 02/15/2016  Check In:     Session Check In - 02/15/16 1713      Check-In   Location [facility]-Cardiac & Pulmonary Rehab   Staff Present Gerlene Burdock, RN, Vickki Hearing, BA, ACSM CEP, Exercise Physiologist   Supervising physician immediately available to respond to emergencies See telemetry face sheet for immediately available ER MD   Warm-up and Cool-down Performed on first and last piece of equipment   Resistance Training Performed Yes   VAD Patient? No     Pain Assessment   Currently in Pain? No/denies           Exercise Prescription Changes - 02/15/16 1400      Exercise Review   Progression Yes     Response to Exercise   Blood Pressure (Admit) 140/80   Blood Pressure (Exercise) 142/80   Blood Pressure (Exit) 128/70   Heart Rate (Admit) 80 bpm   Heart Rate (Exercise) 120 bpm   Heart Rate (Exit) 72 bpm   Rating of Perceived Exertion (Exercise) 12   Symptoms none     Progression   Progression Continue to progress workloads to maintain intensity without signs/symptoms of physical distress.   Average METs 6     Resistance Training   Training Prescription Yes   Weight 10   Reps 10-15     Treadmill   MPH 3.8   Grade 4.5   Minutes 15   METs 6     NuStep   Level 6   Minutes 15      Goals Met:  Proper associated with RPD/PD & O2 Sat Exercise tolerated well  Goals Unmet:  Not Applicable  Comments:     Dr. Emily Filbert is Medical Director for [place] and [facility].

## 2016-02-19 ENCOUNTER — Encounter: Payer: Medicare Other | Admitting: *Deleted

## 2016-02-19 DIAGNOSIS — Z955 Presence of coronary angioplasty implant and graft: Secondary | ICD-10-CM

## 2016-02-19 NOTE — Progress Notes (Signed)
Daily Session Note  Patient Details  Name: Kevin Stewart MRN: [id number] Date of Birth: 06/09/1941 Referring Provider:   Flowsheet Row Cardiac Rehab from 01/10/2016 in [facility] Cardiac and Pulmonary Rehab  Referring Provider  [place]      Encounter Date: 02/19/2016  Check In:     Session Check In - 02/19/16 1631      Check-In   Location [facility]-Cardiac & Pulmonary Rehab   Staff Present Amanda Sommer, BA, ACSM CEP, Exercise Physiologist;Kelly Hayes, BS, ACSM CEP, Exercise Physiologist;Carroll Enterkin, RN, BSN   Supervising physician immediately available to respond to emergencies See telemetry face sheet for immediately available ER MD   Medication changes reported     No   Fall or balance concerns reported    No   Warm-up and Cool-down Performed on first and last piece of equipment   Resistance Training Performed Yes   VAD Patient? No     Pain Assessment   Currently in Pain? No/denies   Multiple Pain Sites No         Goals Met:  Independence with exercise equipment Exercise tolerated well No report of cardiac concerns or symptoms Strength training completed today  Goals Unmet:  Not Applicable  Comments: Patient completed exercise prescription and all exercise goals during rehab session. The exercise was tolerated well and the patient is progressing in the program.     Dr. Mark Miller is Medical Director for [facility] and [facility]. 

## 2016-02-21 ENCOUNTER — Encounter: Payer: Medicare Other | Attending: Cardiology

## 2016-02-21 DIAGNOSIS — Z955 Presence of coronary angioplasty implant and graft: Secondary | ICD-10-CM | POA: Insufficient documentation

## 2016-02-22 DIAGNOSIS — Z955 Presence of coronary angioplasty implant and graft: Secondary | ICD-10-CM

## 2016-02-22 NOTE — Progress Notes (Signed)
Daily Session Note  Patient Details  Name: Kevin Stewart MRN: [id number] Date of Birth: 02/22/1941 Referring Provider:   Flowsheet Row Cardiac Rehab from 01/10/2016 in [facility]  Referring Provider  [name]      Encounter Date: 02/22/2016  Check In:     Session Check In - 02/22/16 1632      Check-In   Location [facility]-Cardiac & Pul[facility] Re[facility]   Staff Present Gerlene Burdock, RN, BSN;Kearsten Ginther, DPT, Burlene Arnt, BA, ACSM CEP, Exercise Physiologist   Supervising physician immediately available to respond to emergencies See telemetry face sheet for immediately available ER MD   Medication changes reported     No   Fall or balance concerns reported    No   Warm-up and Cool-down Performed on first and last piece of equipment   Resistance Training Performed Yes   VAD Patient? No     Pain Assessment   Currently in Pain? No/denies         Goals Met:  Independence with exercise equipment  Goals Unmet:  Not Applicable  Comments: Patient completed exercise prescription and all exercise goals during rehab session. The exercise was tolerated well and the patient is progressing in the program.    Dr. Emily Filbert is Medical Director for [facility] and [facility].

## 2016-02-28 ENCOUNTER — Encounter: Payer: Medicare Other | Admitting: *Deleted

## 2016-02-28 DIAGNOSIS — Z955 Presence of coronary angioplasty implant and graft: Secondary | ICD-10-CM | POA: Diagnosis not present

## 2016-02-28 NOTE — Progress Notes (Signed)
Daily Session Note  Patient Details  Name: Kevin Stewart MRN: [id number] Date of Birth: [birth year] Referring Provider:   Flowsheet Row Cardiac Rehab from 01/10/2016 in [facility]  Referring Provider  [name]      Encounter Date: 02/28/2016  Check In:     Session Check In - 02/28/16 1639      Check-In   Location [facility]   Staff Present Heath Lark, RN, BSN, CCRP;Kaikoa Magro Joya Gaskins, RN, Vickki Hearing, BA, ACSM CEP, Exercise Physiologist   Supervising physician immediately available to respond to emergencies See telemetry face sheet for immediately available ER MD   Medication changes reported     No   Fall or balance concerns reported    No   Warm-up and Cool-down Performed on first and last piece of equipment   Resistance Training Performed Yes   VAD Patient? No     Pain Assessment   Currently in Pain? No/denies         Goals Met:  Independence with exercise equipment Exercise tolerated well No report of cardiac concerns or symptoms Strength training completed today  Goals Unmet:  Not Applicable  Comments:  Pt able to follow exercise prescription today without complaint.  Will continue to monitor for progression.    Dr. Emily Filbert is Medical Director for [facility] and [facility].

## 2016-02-29 ENCOUNTER — Encounter: Payer: Medicare Other | Admitting: *Deleted

## 2016-02-29 DIAGNOSIS — Z955 Presence of coronary angioplasty implant and graft: Secondary | ICD-10-CM

## 2016-02-29 NOTE — Progress Notes (Signed)
Daily Session Note  Patient Details  Name: Kevin Stewart MRN: [id number] Date of Birth: 09/16/1940 Referring Provider:   Flowsheet Row Cardiac Rehab from 01/10/2016 in [facility]  Referring Provider  [place]      Encounter Date: 02/29/2016  Check In:     Session Check In - 02/29/16 1807      Check-In   Location [facility]   Staff Present Susanne Bice, RN, BSN, CCRP;Amanda Sommer, BA, ACSM CEP, Exercise Physiologist;Diane Wright, RN, BSN   Supervising physician immediately available to respond to emergencies See telemetry face sheet for immediately available ER MD   Medication changes reported     No   Fall or balance concerns reported    No   Warm-up and Cool-down Performed on first and last piece of equipment   Resistance Training Performed Yes   VAD Patient? No     Pain Assessment   Currently in Pain? No/denies           Exercise Prescription Changes - 02/29/16 1400      Exercise Review   Progression Yes     Response to Exercise   Blood Pressure (Admit) 140/60   Blood Pressure (Exercise) 184/66   Blood Pressure (Exit) 118/70   Heart Rate (Admit) 69 bpm   Heart Rate (Exercise) 106 bpm   Heart Rate (Exit) 80 bpm   Rating of Perceived Exertion (Exercise) 12   Symptoms none     Progression   Progression Continue to progress workloads to maintain intensity without signs/symptoms of physical distress.   Average METs 5.5     Resistance Training   Training Prescription Yes   Weight 10   Reps 10-15     Interval Training   Interval Training Yes     Treadmill   MPH 3.8   Grade 4.5   Minutes 15   METs 6.27     NuStep   Level 6   Minutes 15     REL-XR   Level 8     Home Exercise Plan   Plans to continue exercise at Home   Frequency Add 2 additional days to program exercise sessions.      Goals Met:  Independence with exercise equipment Exercise tolerated well Personal goals reviewed No report of  cardiac concerns or symptoms Strength training completed today  Goals Unmet:  Not Applicable  Comments:  Pt able to follow exercise prescription today without complaint.  Will continue to monitor for progression.    Dr. Mark Miller is Medical Director for [facility] and [facility]. 

## 2016-03-06 ENCOUNTER — Encounter: Payer: Medicare Other | Admitting: *Deleted

## 2016-03-06 ENCOUNTER — Encounter: Payer: Self-pay | Admitting: *Deleted

## 2016-03-06 DIAGNOSIS — Z955 Presence of coronary angioplasty implant and graft: Secondary | ICD-10-CM

## 2016-03-06 NOTE — Progress Notes (Signed)
Cardiac Individual Treatment Plan  Patient Details  Name: Kevin Stewart MRN: [id number] Date of Birth: 1940/08/27 Referring Provider:   Flowsheet Row Cardiac Rehab from 01/10/2016 in [facility] Cardiac and Pulmonary Rehab  Referring Provider  [name]      Initial Encounter Date:  Flowsheet Row Cardiac Rehab from 01/10/2016 in [facility] Cardiac and Pulmonary Rehab  Date  01/10/16  Referring Provider  [name]      Visit Diagnosis: Status post coronary artery stent placement  Patient's Home Medications on Admission:  Current Outpatient Prescriptions:  .  aspirin 81 MG tablet, Take 81 mg by mouth daily., Disp: , Rfl:  .  carvedilol (COREG) 12.5 MG tablet, Take 12.5 mg by mouth 2 (two) times daily with a meal., Disp: , Rfl:  .  clopidogrel (PLAVIX) 75 MG tablet, Take 75 mg by mouth daily., Disp: , Rfl:  .  enalapril-hydrochlorothiazide (VASERETIC) 10-25 MG per tablet, Take 1 tablet by mouth daily. , Disp: , Rfl:  .  ezetimibe-simvastatin (VYTORIN) 10-40 MG per tablet, Take 1 tablet by mouth at bedtime. Reported on 02/08/2016, Disp: , Rfl:  .  fluticasone (FLONASE) 50 MCG/ACT nasal spray, Place 1 spray into both nostrils as needed., Disp: , Rfl: 0 .  naphazoline-pheniramine (EYE ALLERGY RELIEF) 0.025-0.3 % ophthalmic solution, Place 2 drops into both eyes 2 (two) times daily., Disp: , Rfl:  .  nitroGLYCERIN (NITROSTAT) 0.4 MG SL tablet, Place 1 tablet (0.4 mg total) under the tongue every 5 (five) minutes as needed for chest pain (up to 3 doses)., Disp: 25 tablet, Rfl: 3 .  Omega-3 Fatty Acids (OMEGA-3 IQ PO), Take 1 tablet by mouth daily., Disp: , Rfl:  .  simvastatin (ZOCOR) 40 MG tablet, , Disp: , Rfl: 3  Past Medical History: Past Medical History:  Diagnosis Date  . Arthritis    "hands" (12/20/2015)  . Basal cell carcinoma, face   . Coronary artery disease    a. 2003 - light MI per patient; s/p Cypher DES to prox LAD in [facility].  Marland Kitchen Hx of adenomatous colonic polyps   . Hyperlipemia   .  Hyperlipidemia   . Hypertension   . Kidney stones    "passed them"  . Melanoma of right side of neck ([place])    removed posterior neck [id number]  . Migraine    "stopped in 2001 when I had my heart attack"  . Myocardial infarction ([place])   . Pre-diabetes   . Wears dentures    upper-lower partial  . Wears glasses     Tobacco Use: History  Smoking Status  . Former Smoker  . Packs/day: 2.00  . Years: 10.00  . Types: Cigarettes  . Quit date: 07/10/1973  Smokeless Tobacco  . Former Systems developer  . Types: Chew    Comment: "chewed a little; stopped in 06/1973"    Labs: Recent Review Flowsheet Data    Labs for ITP Cardiac and Pulmonary Rehab Latest Ref Rng & Units 07/21/2012   TCO2 0 - 100 mmol/L 27       Exercise Target Goals:    Exercise Program Goal: Individual exercise prescription set with THRR, safety & activity barriers. Participant demonstrates ability to understand and report RPE using BORG scale, to self-measure pulse accurately, and to acknowledge the importance of the exercise prescription.  Exercise Prescription Goal: Starting with aerobic activity 30 plus minutes a day, 3 days per week for initial exercise prescription. Provide home exercise prescription and guidelines that participant acknowledges understanding prior to discharge.  Activity Barriers & Risk Stratification:     Activity Barriers & Cardiac Risk Stratification - 01/10/16 1240      Activity Barriers & Cardiac Risk Stratification   Activity Barriers Chest Pain/Angina;Shortness of Breath  Angina symptoms continue, MD is aware of symptoms.  Has used NTG to relieve symptoms.    Cardiac Risk Stratification High      6 Minute Walk:     6 Minute Walk    Row Name 01/10/16 1354         6 Minute Walk   Distance 1600 feet     Walk Time 6 minutes     MPH 3     METS 3.85     RPE 13     VO2 Peak 13.5     Symptoms No     Resting HR 67 bpm     Resting BP 130/64     Max Ex. HR 102 bpm     Max Ex. BP  172/76        Initial Exercise Prescription:     Initial Exercise Prescription - 01/10/16 1300      Date of Initial Exercise RX and Referring Provider   Date 01/10/16   Referring Provider [name]     Treadmill   MPH 3   Grade 0.5   Minutes 15   METs 3.5     Bike   Level 1.5   Minutes 15     Recumbant Bike   Level 2   RPM 50   Watts 40   Minutes 15   METs 3.5     NuStep   Level 3   Watts 60   Minutes 15   METs 3.5     Elliptical   Level 1   Speed 50   Minutes 5     REL-XR   Level 2   Watts 60   Minutes 15   METs 3.5     T5 Nustep   Level 2   Watts 60   Minutes 15   METs 3.5     Biostep-RELP   Level 3   Watts 60   Minutes 15   METs 3.5     Prescription Details   Frequency (times per week) 3   Duration Progress to 45 minutes of aerobic exercise without signs/symptoms of physical distress     Intensity   THRR 40-80% of Max Heartrate 99-131   Ratings of Perceived Exertion 11-13   Perceived Dyspnea 0-4     Progression   Progression Continue to progress workloads to maintain intensity without signs/symptoms of physical distress.     Resistance Training   Training Prescription Yes   Weight 6   Reps 10-15      Perform Capillary Blood Glucose checks as needed.  Exercise Prescription Changes:     Exercise Prescription Changes    Row Name 01/10/16 1300 01/19/16 1000 01/31/16 1300 02/15/16 1400 02/29/16 1400     Exercise Review   Progression  -  - Yes Yes Yes     Response to Exercise   Blood Pressure (Admit) 130/64 142/70 142/82 140/80 140/60   Blood Pressure (Exercise) 172/76 158/62 172/78 142/80 184/66   Blood Pressure (Exit) 162/82 124/68 146/72 128/70 118/70   Heart Rate (Admit) 65 bpm 62 bpm 63 bpm 80 bpm 69 bpm   Heart Rate (Exercise) 102 bpm 94 bpm 111 bpm 120 bpm 106 bpm   Heart Rate (Exit) 65 bpm 77 bpm 76 bpm 72 bpm  80 bpm   Oxygen Saturation (Admit) 102 %  -  -  -  -   Oxygen Saturation (Exercise) 83 %  -  -  -  -   Rating  of Perceived Exertion (Exercise)  -  - _0 Perceived Dyspnea (Exercise) 13  -  -  -  -   Symptoms  -  - none none none     Progression   Progression  -  -  - Continue to progress workloads to maintain intensity without signs/symptoms of physical distress. Continue to progress workloads to maintain intensity without signs/symptoms of physical distress.   Average METs  -  -  - 6 5.5     Resistance Training   Training Prescription  - Yes Yes Yes Yes   Weight  - _1 Reps  - 10-15 10-15 10-15 10-15     Interval Training   Interval Training  - No  -  - Yes     Treadmill   MPH  - 3.1 3 3.8 3.8   Grade  - 1 4.5 4.5 4.5   Minutes  - _2 METs  - 3.8 5.1 6 6.27     NuStep   Level  - _3 Watts  - 74  -  -  -   Minutes  - _4 METs  - 3.6 5.3  -  -     REL-XR   Level  -  -  -  - 8     Home Exercise Plan   Plans to continue exercise at  -  -  -  - Home   Frequency  -  -  -  - Add 2 additional days to program exercise sessions.      Exercise Comments:     Exercise Comments    Row Name 01/19/16 1016 01/31/16 1352 02/15/16 1404 02/29/16 1418     Exercise Comments Minh is progressing well with exercise and has completed all sessions with no symtpoms. Mr Cabello has progressed very well with exercise and maintains his HR within recommended range. [name] is progressing well with exercise. Red is progressing well and will try to add another day of walking at home since he is attending only twice per week.       Discharge Exercise Prescription (Final Exercise Prescription Changes):     Exercise Prescription Changes - 02/29/16 1400      Exercise Review   Progression Yes     Response to Exercise   Blood Pressure (Admit) 140/60   Blood Pressure (Exercise) 184/66   Blood Pressure (Exit) 118/70   Heart Rate (Admit) 69 bpm   Heart Rate (Exercise) 106 bpm   Heart Rate (Exit) 80 bpm   Rating of Perceived Exertion (Exercise) 12   Symptoms none      Progression   Progression Continue to progress workloads to maintain intensity without signs/symptoms of physical distress.   Average METs 5.5     Resistance Training   Training Prescription Yes   Weight 10   Reps 10-15     Interval Training   Interval Training Yes     Treadmill   MPH 3.8   Grade 4.5   Minutes 15   METs 6.27     NuStep   Level 6   Minutes 15     REL-XR   Level 8  Home Exercise Plan   Plans to continue exercise at Home   Frequency Add 2 additional days to program exercise sessions.      Nutrition:  Target Goals: Understanding of nutrition guidelines, daily intake of sodium <1548m, cholesterol <2029m calories 30% from fat and 7% or less from saturated fats, daily to have 5 or more servings of fruits and vegetables.  Biometrics:    Nutrition Therapy Plan and Nutrition Goals:     Nutrition Therapy & Goals - 02/01/16 1618      Personal Nutrition Goals   Personal Goal #1 Cont to eat healthy. Prefers to not meet individually with the Cardiac Rehab Registered Dietician.      Nutrition Discharge: Rate Your Plate Scores:     Nutrition Assessments - 01/12/16 1533      Rate Your Plate Scores   Pre Score 69   Pre Score % 77 %      Nutrition Goals Re-Evaluation:   Psychosocial: Target Goals: Acknowledge presence or absence of depression, maximize coping skills, provide positive support system. Participant is able to verbalize types and ability to use techniques and skills needed for reducing stress and depression.  Initial Review & Psychosocial Screening:     Initial Psych Review & Screening - 01/10/16 12FithianYes  Wife     Barriers   Psychosocial barriers to participate in program There are no identifiable barriers or psychosocial needs.;The patient should benefit from training in stress management and relaxation.     Screening Interventions   Interventions Encouraged to exercise       Quality of Life Scores:     Quality of Life - 01/10/16 1415      Quality of Life Scores   Health/Function Pre 28.87 %   Socioeconomic Pre 30 %   Psych/Spiritual Pre 30 %   Family Pre 30 %   GLOBAL Pre 29.5 %      PHQ-9: Recent Review Flowsheet Data    [facility] 01/10/2016   Decreased Interest 0   Down, Depressed, Hopeless 0   PHQ - 2 Score 0   Altered sleeping 0   Tired, decreased energy 0   Change in appetite 0   Feeling bad or failure about yourself  0   Trouble concentrating 0   Moving slowly or fidgety/restless 0   Suicidal thoughts 0   PHQ-9 Score 0   Difficult doing work/chores Not difficult at all      Psychosocial Evaluation and Intervention:     Psychosocial Evaluation - 02/12/16 1711      Psychosocial Evaluation & Interventions   Interventions Encouraged to exercise with the program and follow exercise prescription   Comments Counselor met with Mr. FoSantillioday for initial psychosocial evaluation.   He reports this is his second time in this Cardiac Rehab program.  Mr. FoBards [age]ears old and had a stent inserted in early June.  He states his sleep and appetite are good and he denies a history of depression or anxiety or any current symptoms.  Mr. FoSpahras stress mostly at work, but he states his mood is positive most of the time.  He has goals to finish this program and be overall healthier.  Counselor encouraged consistency in exercising following completion of this program.        Psychosocial Re-Evaluation:     Psychosocial Re-Evaluation    [facility] 02/28/16 [phone]  Psychosocial Re-Evaluation   Comments Counselor follow up with Mr. Wanda Plump "Red" today stating he is experiencing a lot of progress since coming into this program.  He reports the tightness in his chest is gone and he has loosened up in that area as he works out now so no more discomfort.  He reports he sleeps well and increased stamina since starting  in Cardiac Rehab.  Counselor commended Red for his commitment to exercise and  progress made.            Vocational Rehabilitation: Provide vocational rehab assistance to qualifying candidates.   Vocational Rehab Evaluation & Intervention:     Vocational Rehab - 01/10/16 1244      Initial Vocational Rehab Evaluation & Intervention   Assessment shows need for Vocational Rehabilitation No      Education: Education Goals: Education classes will be provided on a weekly basis, covering required topics. Participant will state understanding/return demonstration of topics presented.  Learning Barriers/Preferences:     Learning Barriers/Preferences - 01/10/16 1241      Learning Barriers/Preferences   Learning Barriers None   Learning Preferences Individual Instruction      Education Topics: General Nutrition Guidelines/Fats and Fiber: -Group instruction provided by verbal, written material, models and posters to present the general guidelines for heart healthy nutrition. Gives an explanation and review of dietary fats and fiber. Flowsheet Row Cardiac Rehab from 02/28/2016 in [facility] Cardiac and Pulmonary Rehab  Date  02/12/16  Educator  PI  Instruction Review Code  2- meets goals/outcomes      Controlling Sodium/Reading Food Labels: -Group verbal and written material supporting the discussion of sodium use in heart healthy nutrition. Review and explanation with models, verbal and written materials for utilization of the food label. Flowsheet Row Cardiac Rehab from 02/28/2016 in [facility] Cardiac and Pulmonary Rehab  Date  02/19/16  Educator  PI  Instruction Review Code  2- meets goals/outcomes      Exercise Physiology & Risk Factors: - Group verbal and written instruction with models to review the exercise physiology of the cardiovascular system and associated critical values. Details cardiovascular disease risk factors and the goals associated with each risk factor.   Aerobic  Exercise & Resistance Training: - Gives group verbal and written discussion on the health impact of inactivity. On the components of aerobic and resistive training programs and the benefits of this training and how to safely progress through these programs. Flowsheet Row Cardiac Rehab from 02/28/2016 in [facility] Cardiac and Pulmonary Rehab  Date  02/28/16  Educator  Nada Maclachlan  Instruction Review Code  2- meets goals/outcomes      Flexibility, Balance, General Exercise Guidelines: - Provides group verbal and written instruction on the benefits of flexibility and balance training programs. Provides general exercise guidelines with specific guidelines to those with heart or lung disease. Demonstration and skill practice provided.   Stress Management: - Provides group verbal and written instruction about the health risks of elevated stress, cause of high stress, and healthy ways to reduce stress. Flowsheet Row Cardiac Rehab from 02/28/2016 in [facility] Cardiac and Pulmonary Rehab  Date  01/17/16  Educator  Kathreen Cornfield, [facility]  Instruction Review Code  2- meets goals/outcomes      Depression: - Provides group verbal and written instruction on the correlation between heart/lung disease and depressed mood, treatment options, and the stigmas associated with seeking treatment.   Anatomy & Physiology of the Heart: - Group verbal and written instruction and models provide basic  cardiac anatomy and physiology, with the coronary electrical and arterial systems. Review of: AMI, Angina, Valve disease, Heart Failure, Cardiac Arrhythmia, Pacemakers, and the ICD. Flowsheet Row Cardiac Rehab from 02/28/2016 in [facility] Cardiac and Pulmonary Rehab  Date  01/15/16  Educator  DW  Instruction Review Code  2- meets goals/outcomes      Cardiac Procedures: - Group verbal and written instruction and models to describe the testing methods done to diagnose heart disease. Reviews the outcomes of the test results. Describes the  treatment choices: Medical Management, Angioplasty, or Coronary Bypass Surgery.   Cardiac Medications: - Group verbal and written instruction to review commonly prescribed medications for heart disease. Reviews the medication, class of the drug, and side effects. Includes the steps to properly store meds and maintain the prescription regimen. Flowsheet Row Cardiac Rehab from 02/28/2016 in [facility] Cardiac and Pulmonary Rehab  Date  01/29/16  Educator  DW  Instruction Review Code  2- meets goals/outcomes      Go Sex-Intimacy & Heart Disease, Get SMART - Goal Setting: - Group verbal and written instruction through game format to discuss heart disease and the return to sexual intimacy. Provides group verbal and written material to discuss and apply goal setting through the application of the S.M.A.R.T. Method.   Other Matters of the Heart: - Provides group verbal, written materials and models to describe Heart Failure, Angina, Valve Disease, and Diabetes in the realm of heart disease. Includes description of the disease process and treatment options available to the cardiac patient.   Exercise & Equipment Safety: - Individual verbal instruction and demonstration of equipment use and safety with use of the equipment. Flowsheet Row Cardiac Rehab from 02/28/2016 in [facility] Cardiac and Pulmonary Rehab  Date  01/11/16  Educator  C. EnterkinRN  Instruction Review Code  2- meets goals/outcomes      Infection Prevention: - Provides verbal and written material to individual with discussion of infection control including proper hand washing and proper equipment cleaning during exercise session. Flowsheet Row Cardiac Rehab from 02/28/2016 in [facility] Cardiac and Pulmonary Rehab  Date  01/10/16  Educator  SB  Instruction Review Code  2- meets goals/outcomes      Falls Prevention: - Provides verbal and written material to individual with discussion of falls prevention and safety. Flowsheet Row Cardiac Rehab  from 02/28/2016 in [facility] Cardiac and Pulmonary Rehab  Date  01/10/16  Educator  SB  Instruction Review Code  2- meets goals/outcomes      Diabetes: - Individual verbal and written instruction to review signs/symptoms of diabetes, desired ranges of glucose level fasting, after meals and with exercise. Advice that pre and post exercise glucose checks will be done for 3 sessions at entry of program.    Knowledge Questionnaire Score:     Knowledge Questionnaire Score - 01/10/16 1241      Knowledge Questionnaire Score   Pre Score 20/28      Core Components/Risk Factors/Patient Goals at Admission:     Personal Goals and Risk Factors at Admission - 01/10/16 1247      Core Components/Risk Factors/Patient Goals on Admission   Hypertension Yes   Intervention Provide education on lifestyle modifcations including regular physical activity/exercise, weight management, moderate sodium restriction and increased consumption of fresh fruit, vegetables, and low fat dairy, alcohol moderation, and smoking cessation.;Monitor prescription use compliance.   Expected Outcomes Short Term: Continued assessment and intervention until BP is < 140/74m HG in hypertensive participants. < 130/896mHG in hypertensive participants with  diabetes, heart failure or chronic kidney disease.;Long Term: Maintenance of blood pressure at goal levels.      Core Components/Risk Factors/Patient Goals Review:      Goals and Risk Factor Review    Row Name 02/01/16 1645 02/22/16 1644 02/29/16 1807 02/29/16 1809 02/29/16 1819     Core Components/Risk Factors/Patient Goals Review   Personal Goals Review Hypertension;Lipids;Sedentary Sedentary;Increase Strength and Stamina Hypertension;Lipids  - Lipids;Hypertension   Review Mr Ky states that he measures BP at home and it has been around 137/70's, last cholesterol check was in April and was 150 total.  He has retuned to work and using a weed eater at home and has done fine  with lifting at work.  He states he is pre-diabetic and measures BG at home.  He will follow up with his Dr at next 6 month check up.  Home exercise program discussed with Mr Wilmes with verbalization of understand. - - Lipids:  [name] has not had lipid panel since April.  His insurance would not continue paying for Vytorin 10/40.  [name] is now taking Simvastatin 40 mg once a day.  [name] reports that he feels better just being on Simvastatin.  [name] will have his cholesterol checked again in October 2017 when he sees his primary care physician.  BP upon check in on 02/28/16 was 140/60.  [name] was coming in from work.  Post exercise BP 118/70.  [name] reports checking his BP at home and states it usually ranges from 120s to 130s over 60s to 70s.     Expected Outcomes  -  -  -  - Continue Cardiac Rehab to achieve goals of controlling hypertension and cholesterol.        Core Components/Risk Factors/Patient Goals at Discharge (Final Review):      Goals and Risk Factor Review - 02/29/16 1819      Core Components/Risk Factors/Patient Goals Review   Personal Goals Review Lipids;Hypertension   Review Lipids:  [name] has not had lipid panel since April.  His insurance would not continue paying for Vytorin 10/40.  [name] is now taking Simvastatin 40 mg once a day.  [name] reports that he feels better just being on Simvastatin.  [name] will have his cholesterol checked again in October 2017 when he sees his primary care physician.  BP upon check in on 02/28/16 was 140/60.  [name] was coming in from work.  Post exercise BP 118/70.  [name] reports checking his BP at home and states it usually ranges from 120s to 130s over 60s to 70s.     Expected Outcomes Continue Cardiac Rehab to achieve goals of controlling hypertension and cholesterol.        ITP Comments:     ITP Comments    Row Name 01/10/16 1416 02/01/16 1619 02/07/16 0833 03/06/16 0823     ITP Comments Initial medica review completed. ITP created.  Documentation of diagnosis  found in [facility] discharge summary 12/21/2015 Cont to eat healthy. Prefers to not meet individually with the Cardiac Rehab Registered Dietician. 30 day review. Continue with ITP 30 day review. Continue with ITP unless changes noted by Medical Director at signature of review.       Comments:

## 2016-03-06 NOTE — Progress Notes (Signed)
Daily Session Note  Patient Details  Name: KINGSTYN DERUITER MRN: [id number] Date of Birth: [birth year] Referring Provider:   Flowsheet Row Cardiac Rehab from 01/10/2016 in [facility] Cardiac and Pulmonary Rehab  Referring Provider  [name]      Encounter Date: 03/06/2016  Check In:     Session Check In - 03/06/16 1619      Check-In   Location [facility]-Cardiac & Pulmonary Rehab   Staff Present Gerlene Burdock, RN, Vickki Hearing, BA, ACSM CEP, Exercise Physiologist;Diane Joya Gaskins, RN, BSN   Supervising physician immediately available to respond to emergencies See telemetry face sheet for immediately available ER MD   Medication changes reported     No   Warm-up and Cool-down Performed on first and last piece of equipment   Resistance Training Performed Yes   VAD Patient? No     Pain Assessment   Currently in Pain? No/denies         Goals Met:  Proper associated with RPD/PD & O2 Sat Exercise tolerated well  Goals Unmet:  Not Applicable  Comments:     Dr. Emily Filbert is Medical Director for [place] and [facility].

## 2016-03-07 DIAGNOSIS — Z955 Presence of coronary angioplasty implant and graft: Secondary | ICD-10-CM | POA: Diagnosis not present

## 2016-03-07 NOTE — Progress Notes (Signed)
Daily Session Note  Patient Details  Name: [name] MRN: [id number] Date of Birth: 12-Sep-1940 Referring Provider:   Flowsheet Row Cardiac Rehab from 01/10/2016 in [facility] Cardiac and Pulmonary Rehab  Referring Provider  [name]      Encounter Date: 03/07/2016  Check In:     Session Check In - 03/07/16 1724      Check-In   Location [facility]-Cardiac & Pulmonary Rehab   Staff Present Jeanell Sparrow, DPT, Burlene Arnt, BA, ACSM CEP, Exercise Physiologist;Diane Joya Gaskins, RN, BSN   Supervising physician immediately available to respond to emergencies See telemetry face sheet for immediately available ER MD   Medication changes reported     No   Fall or balance concerns reported    No   Warm-up and Cool-down Performed on first and last piece of equipment   Resistance Training Performed Yes   VAD Patient? No     Pain Assessment   Currently in Pain? No/denies         Goals Met:  Exercise tolerated well  Goals Unmet:  Not Applicable  Comments: Patient completed exercise prescription and all exercise goals during rehab session. The exercise was tolerated well and the patient is progressing in the program.    Dr. Emily Filbert is Medical Director for [place] and [facility].

## 2016-03-12 DIAGNOSIS — Z6824 Body mass index (BMI) 24.0-24.9, adult: Secondary | ICD-10-CM | POA: Diagnosis not present

## 2016-03-12 DIAGNOSIS — R109 Unspecified abdominal pain: Secondary | ICD-10-CM | POA: Diagnosis not present

## 2016-03-13 DIAGNOSIS — Z955 Presence of coronary angioplasty implant and graft: Secondary | ICD-10-CM | POA: Diagnosis not present

## 2016-03-13 NOTE — Progress Notes (Signed)
Daily Session Note  Patient Details  Name: Kevin Stewart MRN: [id number] Date of Birth: [birth year] Referring Provider:   Flowsheet Row Cardiac Rehab from 01/10/2016 in [facility] Cardiac and Pulmonary Rehab  Referring Provider  [name]      Encounter Date: 03/13/2016  Check In:     Session Check In - 03/13/16 1706      Check-In   Location [facility]-Cardiac & Pulmonary Rehab   Staff Present Nyoka Cowden, RN, BSN, Willette Pa, MA, ACSM RCEP, Exercise Physiologist;Amanda Oletta Darter, [place], ACSM CEP, Exercise Physiologist   Supervising physician immediately available to respond to emergencies See telemetry face sheet for immediately available ER MD   Medication changes reported     No   Fall or balance concerns reported    No   Warm-up and Cool-down Performed on first and last piece of equipment   Resistance Training Performed Yes   VAD Patient? No     Pain Assessment   Currently in Pain? No/denies   Multiple Pain Sites No         Goals Met:  Independence with exercise equipment Exercise tolerated well Personal goals reviewed No report of cardiac concerns or symptoms Strength training completed today  Goals Unmet:  BP elevated on NuStep (186), reduced workload and recoverd to 156  Comments: Pt able to follow exercise prescription today without complaint.  Will continue to monitor for progression.    Dr. Emily Filbert is Medical Director for [place] and [facility].

## 2016-03-14 ENCOUNTER — Encounter: Payer: Medicare Other | Admitting: *Deleted

## 2016-03-14 DIAGNOSIS — Z955 Presence of coronary angioplasty implant and graft: Secondary | ICD-10-CM | POA: Diagnosis not present

## 2016-03-14 NOTE — Progress Notes (Signed)
Daily Session Note  Patient Details  Name: Kevin Stewart MRN: [id number] Date of Birth: [birth year] Referring Provider:   Flowsheet Row Cardiac Rehab from 01/10/2016 in [facility] Cardiac and Pulmonary Rehab  Referring Provider  [name]      Encounter Date: 03/14/2016  Check In:     Session Check In - 03/14/16 1728      Check-In   Staff Present Heath Lark, RN, BSN, CCRP;Mary Kellie Shropshire, RN, BSN, Francene Boyers, DPT, CEEA   Supervising physician immediately available to respond to emergencies See telemetry face sheet for immediately available ER MD   Medication changes reported     No   Fall or balance concerns reported    No   Warm-up and Cool-down Performed on first and last piece of equipment   Resistance Training Performed Yes   VAD Patient? No           Exercise Prescription Changes - 03/14/16 1200      Exercise Review   Progression Yes     Response to Exercise   Blood Pressure (Admit) 136/64   Blood Pressure (Exercise) 186/64   Blood Pressure (Exit) 132/66   Heart Rate (Admit) 90 bpm   Heart Rate (Exercise) 133 bpm   Heart Rate (Exit) 68 bpm   Rating of Perceived Exertion (Exercise) 13   Symptoms none     Progression   Progression Continue to progress workloads to maintain intensity without signs/symptoms of physical distress.   Average METs 6.2     Resistance Training   Training Prescription Yes   Weight 10   Reps 10-15     Interval Training   Interval Training Yes   Equipment NuStep     Treadmill   MPH 3.8   Grade 4.5   Minutes 15   METs 6.27     NuStep   Level 6   Minutes 15   METs 6.2     REL-XR   Level 10   Minutes 15   METs 6.5      Goals Met:  Independence with exercise equipment Exercise tolerated well No report of cardiac concerns or symptoms Strength training completed today  Goals Unmet:  Not Applicable  Comments: Doing well with exercise prescription progression.    Dr. Emily Filbert is Medical Director for  [place] and [facility].

## 2016-03-20 DIAGNOSIS — Z955 Presence of coronary angioplasty implant and graft: Secondary | ICD-10-CM

## 2016-03-20 NOTE — Progress Notes (Signed)
Daily Session Note  Patient Details  Name: VIPUL CAFARELLI MRN: [id number] Date of Birth: 28-Sep-1940 Referring Provider:   Flowsheet Row Cardiac Rehab from 01/10/2016 in [facility] Cardiac and Pulmonary Rehab  Referring Provider  [name]      Encounter Date: 03/20/2016  Check In:     Session Check In - 03/20/16 1707      Check-In   Location [facility]-Cardiac & Pulmonary Rehab   Staff Present Heath Lark, RN, BSN, Lance Sell, BA, ACSM CEP, Exercise Physiologist;Carroll Enterkin, RN, BSN   Supervising physician immediately available to respond to emergencies See telemetry face sheet for immediately available ER MD   Medication changes reported     No   Fall or balance concerns reported    No   Warm-up and Cool-down Performed on first and last piece of equipment   Resistance Training Performed Yes   VAD Patient? No     Pain Assessment   Currently in Pain? No/denies   Multiple Pain Sites No         Goals Met:  Independence with exercise equipment Exercise tolerated well No report of cardiac concerns or symptoms Strength training completed today  Goals Unmet:  Not Applicable  Comments: Pt able to follow exercise prescription today without complaint.  Will continue to monitor for progression.   Dr. Emily Filbert is Medical Director for [place] and [facility].

## 2016-03-21 DIAGNOSIS — Z955 Presence of coronary angioplasty implant and graft: Secondary | ICD-10-CM | POA: Diagnosis not present

## 2016-03-21 NOTE — Progress Notes (Signed)
Daily Session Note  Patient Details  Name: Kevin Stewart MRN: [id number] Date of Birth: 1941-05-29 Referring Provider:   Flowsheet Row Cardiac Rehab from 01/10/2016 in [facility] Cardiac and Pulmonary Rehab  Referring Provider  [name]      Encounter Date: 03/21/2016  Check In:     Session Check In - 03/21/16 1705      Check-In   Location [facility]-Cardiac & Pulmonary Rehab   Staff Present Gerlene Burdock, RN, BSN;Broghan Pannone, DPT, Burlene Arnt, BA, ACSM CEP, Exercise Physiologist   Supervising physician immediately available to respond to emergencies See telemetry face sheet for immediately available ER MD   Medication changes reported     No   Fall or balance concerns reported    No   Warm-up and Cool-down Performed on first and last piece of equipment   Resistance Training Performed Yes   VAD Patient? No     Pain Assessment   Currently in Pain? No/denies   Multiple Pain Sites No         Goals Met:  Independence with exercise equipment  Goals Unmet:  Not Applicable  Comments: Patient completed exercise prescription and all exercise goals during rehab session. The exercise was tolerated well and the patient is progressing in the program.    Dr. Emily Filbert is Medical Director for [place] and [facility].

## 2016-03-27 ENCOUNTER — Encounter: Payer: Medicare Other | Attending: Cardiology | Admitting: *Deleted

## 2016-03-27 DIAGNOSIS — Z955 Presence of coronary angioplasty implant and graft: Secondary | ICD-10-CM | POA: Diagnosis not present

## 2016-03-27 NOTE — Progress Notes (Signed)
Daily Session Note  Patient Details  Name: Kevin Stewart MRN: [id number] Date of Birth: 16-Sep-1940 Referring Provider:   Flowsheet Row Cardiac Rehab from 01/10/2016 in [facility] Cardiac and Pulmonary Rehab  Referring Provider  [name]      Encounter Date: 03/27/2016  Check In:     Session Check In - 03/27/16 [name]      Check-In   Staff Present Heath Lark, RN, BSN, CCRP;Carroll Enterkin, RN, Vickki Hearing, BA, ACSM CEP, Exercise Physiologist   Supervising physician immediately available to respond to emergencies See telemetry face sheet for immediately available ER MD   Medication changes reported     No   Fall or balance concerns reported    No   Warm-up and Cool-down Performed on first and last piece of equipment   Resistance Training Performed Yes   VAD Patient? No     Pain Assessment   Currently in Pain? No/denies         Goals Met:  Independence with exercise equipment Exercise tolerated well No report of cardiac concerns or symptoms Strength training completed today  Goals Unmet:  Not Applicable  Comments: Doing well with exercise prescription progression.    Dr. Emily Filbert is Medical Director for [place] and [facility].

## 2016-03-28 DIAGNOSIS — Z955 Presence of coronary angioplasty implant and graft: Secondary | ICD-10-CM | POA: Diagnosis not present

## 2016-03-28 NOTE — Progress Notes (Signed)
Daily Session Note  Patient Details  Name: Kevin Stewart MRN: [id number] Date of Birth: [birth year] Referring Provider:   Flowsheet Row Cardiac Rehab from 01/10/2016 in [facility] Cardiac and Pulmonary Rehab  Referring Provider  [name]      Encounter Date: 03/28/2016  Check In:     Session Check In - 03/28/16 1704      Check-In   Location [facility]-Cardiac & Pulmonary Rehab   Staff Present Alberteen Sam, MA, ACSM RCEP, Exercise Physiologist;Amanda Oletta Darter, BA, ACSM CEP, Exercise Physiologist;Carroll Enterkin, RN, BSN   Supervising physician immediately available to respond to emergencies See telemetry face sheet for immediately available ER MD   Medication changes reported     No   Fall or balance concerns reported    No   Warm-up and Cool-down Performed on first and last piece of equipment   Resistance Training Performed Yes   VAD Patient? No     Pain Assessment   Currently in Pain? No/denies   Multiple Pain Sites No           Exercise Prescription Changes - 03/28/16 1000      Exercise Review   Progression Yes     Response to Exercise   Blood Pressure (Admit) 128/70   Blood Pressure (Exercise) 164/66   Blood Pressure (Exit) 128/66   Heart Rate (Admit) 87 bpm   Heart Rate (Exercise) 112 bpm   Heart Rate (Exit) 71 bpm   Rating of Perceived Exertion (Exercise) 12   Symptoms none     Progression   Progression Continue to progress workloads to maintain intensity without signs/symptoms of physical distress.   Average METs 6     Resistance Training   Training Prescription Yes   Weight 10   Reps 10-15     Treadmill   MPH 3.8   Grade 4.5   Minutes 15   METs 6.27     REL-XR   Level 10   Minutes 15   METs 6      Goals Met:  Independence with exercise equipment Exercise tolerated well No report of cardiac concerns or symptoms Strength training completed today  Goals Unmet:  Not Applicable  Comments: Pt able to follow exercise prescription today without  complaint.  Will continue to monitor for progression.    Dr. Emily Filbert is Medical Director for [place] and [facility].

## 2016-04-03 ENCOUNTER — Encounter: Payer: Self-pay | Admitting: *Deleted

## 2016-04-03 ENCOUNTER — Encounter: Payer: Medicare Other | Admitting: *Deleted

## 2016-04-03 DIAGNOSIS — Z955 Presence of coronary angioplasty implant and graft: Secondary | ICD-10-CM

## 2016-04-03 NOTE — Progress Notes (Signed)
Cardiac Individual Treatment Plan  Patient Details  Name: Kevin Stewart MRN: [id number] Date of Birth: 1940/08/27 Referring Provider:   Flowsheet Row Cardiac Rehab from 01/10/2016 in [facility] Cardiac and Pulmonary Rehab  Referring Provider  [name]      Initial Encounter Date:  Flowsheet Row Cardiac Rehab from 01/10/2016 in [facility] Cardiac and Pulmonary Rehab  Date  01/10/16  Referring Provider  [name]      Visit Diagnosis: Status post coronary artery stent placement  Patient's Home Medications on Admission:  Current Outpatient Prescriptions:  .  aspirin 81 MG tablet, Take 81 mg by mouth daily., Disp: , Rfl:  .  carvedilol (COREG) 12.5 MG tablet, Take 12.5 mg by mouth 2 (two) times daily with a meal., Disp: , Rfl:  .  clopidogrel (PLAVIX) 75 MG tablet, Take 75 mg by mouth daily., Disp: , Rfl:  .  enalapril-hydrochlorothiazide (VASERETIC) 10-25 MG per tablet, Take 1 tablet by mouth daily. , Disp: , Rfl:  .  ezetimibe-simvastatin (VYTORIN) 10-40 MG per tablet, Take 1 tablet by mouth at bedtime. Reported on 02/08/2016, Disp: , Rfl:  .  fluticasone (FLONASE) 50 MCG/ACT nasal spray, Place 1 spray into both nostrils as needed., Disp: , Rfl: 0 .  naphazoline-pheniramine (EYE ALLERGY RELIEF) 0.025-0.3 % ophthalmic solution, Place 2 drops into both eyes 2 (two) times daily., Disp: , Rfl:  .  nitroGLYCERIN (NITROSTAT) 0.4 MG SL tablet, Place 1 tablet (0.4 mg total) under the tongue every 5 (five) minutes as needed for chest pain (up to 3 doses)., Disp: 25 tablet, Rfl: 3 .  Omega-3 Fatty Acids (OMEGA-3 IQ PO), Take 1 tablet by mouth daily., Disp: , Rfl:  .  simvastatin (ZOCOR) 40 MG tablet, , Disp: , Rfl: 3  Past Medical History: Past Medical History:  Diagnosis Date  . Arthritis    "hands" (12/20/2015)  . Basal cell carcinoma, face   . Coronary artery disease    a. 2003 - light MI per patient; s/p Cypher DES to prox LAD in [facility].  Marland Kitchen Hx of adenomatous colonic polyps   . Hyperlipemia   .  Hyperlipidemia   . Hypertension   . Kidney stones    "passed them"  . Melanoma of right side of neck ([place])    removed posterior neck [id number]  . Migraine    "stopped in 2001 when I had my heart attack"  . Myocardial infarction ([place])   . Pre-diabetes   . Wears dentures    upper-lower partial  . Wears glasses     Tobacco Use: History  Smoking Status  . Former Smoker  . Packs/day: 2.00  . Years: 10.00  . Types: Cigarettes  . Quit date: 07/10/1973  Smokeless Tobacco  . Former Systems developer  . Types: Chew    Comment: "chewed a little; stopped in 06/1973"    Labs: Recent Review Flowsheet Data    Labs for ITP Cardiac and Pulmonary Rehab Latest Ref Rng & Units 07/21/2012   TCO2 0 - 100 mmol/L 27       Exercise Target Goals:    Exercise Program Goal: Individual exercise prescription set with THRR, safety & activity barriers. Participant demonstrates ability to understand and report RPE using BORG scale, to self-measure pulse accurately, and to acknowledge the importance of the exercise prescription.  Exercise Prescription Goal: Starting with aerobic activity 30 plus minutes a day, 3 days per week for initial exercise prescription. Provide home exercise prescription and guidelines that participant acknowledges understanding prior to discharge.  Activity Barriers & Risk Stratification:     Activity Barriers & Cardiac Risk Stratification - 01/10/16 1240      Activity Barriers & Cardiac Risk Stratification   Activity Barriers Chest Pain/Angina;Shortness of Breath  Angina symptoms continue, MD is aware of symptoms.  Has used NTG to relieve symptoms.    Cardiac Risk Stratification High      6 Minute Walk:     6 Minute Walk    Row Name 01/10/16 1354         6 Minute Walk   Distance 1600 feet     Walk Time 6 minutes     MPH 3     METS 3.85     RPE 13     VO2 Peak 13.5     Symptoms No     Resting HR 67 bpm     Resting BP 130/64     Max Ex. HR 102 bpm     Max Ex. BP  172/76        Initial Exercise Prescription:     Initial Exercise Prescription - 01/10/16 1300      Date of Initial Exercise RX and Referring Provider   Date 01/10/16   Referring Provider [name]     Treadmill   MPH 3   Grade 0.5   Minutes 15   METs 3.5     Bike   Level 1.5   Minutes 15     Recumbant Bike   Level 2   RPM 50   Watts 40   Minutes 15   METs 3.5     NuStep   Level 3   Watts 60   Minutes 15   METs 3.5     Elliptical   Level 1   Speed 50   Minutes 5     REL-XR   Level 2   Watts 60   Minutes 15   METs 3.5     T5 Nustep   Level 2   Watts 60   Minutes 15   METs 3.5     Biostep-RELP   Level 3   Watts 60   Minutes 15   METs 3.5     Prescription Details   Frequency (times per week) 3   Duration Progress to 45 minutes of aerobic exercise without signs/symptoms of physical distress     Intensity   THRR 40-80% of Max Heartrate 99-131   Ratings of Perceived Exertion 11-13   Perceived Dyspnea 0-4     Progression   Progression Continue to progress workloads to maintain intensity without signs/symptoms of physical distress.     Resistance Training   Training Prescription Yes   Weight 6   Reps 10-15      Perform Capillary Blood Glucose checks as needed.  Exercise Prescription Changes:     Exercise Prescription Changes    Row Name 01/10/16 1300 01/19/16 1000 01/31/16 1300 02/15/16 1400 02/29/16 1400     Exercise Review   Progression  -  - Yes Yes Yes     Response to Exercise   Blood Pressure (Admit) 130/64 142/70 142/82 140/80 140/60   Blood Pressure (Exercise) 172/76 158/62 172/78 142/80 184/66   Blood Pressure (Exit) 162/82 124/68 146/72 128/70 118/70   Heart Rate (Admit) 65 bpm 62 bpm 63 bpm 80 bpm 69 bpm   Heart Rate (Exercise) 102 bpm 94 bpm 111 bpm 120 bpm 106 bpm   Heart Rate (Exit) 65 bpm 77 bpm 76 bpm 72 bpm  80 bpm   Oxygen Saturation (Admit) 102 %  -  -  -  -   Oxygen Saturation (Exercise) 83 %  -  -  -  -   Rating  of Perceived Exertion (Exercise)  -  - '12 12 12   '$ Perceived Dyspnea (Exercise) 13  -  -  -  -   Symptoms  -  - none none none     Progression   Progression  -  -  - Continue to progress workloads to maintain intensity without signs/symptoms of physical distress. Continue to progress workloads to maintain intensity without signs/symptoms of physical distress.   Average METs  -  -  - 6 5.5     Resistance Training   Training Prescription  - Yes Yes Yes Yes   Weight  - '10 10 10 10   '$ Reps  - 10-15 10-15 10-15 10-15     Interval Training   Interval Training  - No  -  - Yes     Treadmill   MPH  - 3.1 3 3.8 3.8   Grade  - 1 4.5 4.5 4.5   Minutes  - '22 10 15 15   '$ METs  - 3.8 5.1 6 6.27     NuStep   Level  - '5 7 6 6   '$ Watts  - 74  -  -  -   Minutes  - '15 25 15 15   '$ METs  - 3.6 5.3  -  -     REL-XR   Level  -  -  -  - 8     Home Exercise Plan   Plans to continue exercise at  -  -  -  - Home   Frequency  -  -  -  - Add 2 additional days to program exercise sessions.   [place] Name 03/14/16 1200 03/28/16 1000           Exercise Review   Progression Yes Yes        Response to Exercise   Blood Pressure (Admit) 136/64 128/70      Blood Pressure (Exercise) 186/64 164/66      Blood Pressure (Exit) 132/66 128/66      Heart Rate (Admit) 90 bpm 87 bpm      Heart Rate (Exercise) 133 bpm 112 bpm      Heart Rate (Exit) 68 bpm 71 bpm      Rating of Perceived Exertion (Exercise) 13 12      Symptoms none none        Progression   Progression Continue to progress workloads to maintain intensity without signs/symptoms of physical distress. Continue to progress workloads to maintain intensity without signs/symptoms of physical distress.      Average METs 6.2 6        Resistance Training   Training Prescription Yes Yes      Weight 10 10      Reps 10-15 10-15        Interval Training   Interval Training Yes  -      Equipment NuStep  -        Treadmill   MPH 3.8 3.8      Grade 4.5 4.5       Minutes 15 15      METs 6.27 6.27        NuStep   Level 6  -      Minutes 15  -  METs 6.2  -        REL-XR   Level 10 10      Minutes 15 15      METs 6.5 6         Exercise Comments:     Exercise Comments    Row Name 01/19/16 1016 01/31/16 1352 02/15/16 1404 02/29/16 1418 03/14/16 1259   Exercise Comments Jaycen is progressing well with exercise and has completed all sessions with no symtpoms. Mr Mcnair has progressed very well with exercise and maintains his HR within recommended range. [name] is progressing well with exercise. Red is progressing well and will try to add another day of walking at home since he is attending only twice per week. Mr Rodger is progressing well with exercise.   [place] Name 03/28/16 1054           Exercise Comments [name] continues to do well with exercise.          Discharge Exercise Prescription (Final Exercise Prescription Changes):     Exercise Prescription Changes - 03/28/16 1000      Exercise Review   Progression Yes     Response to Exercise   Blood Pressure (Admit) 128/70   Blood Pressure (Exercise) 164/66   Blood Pressure (Exit) 128/66   Heart Rate (Admit) 87 bpm   Heart Rate (Exercise) 112 bpm   Heart Rate (Exit) 71 bpm   Rating of Perceived Exertion (Exercise) 12   Symptoms none     Progression   Progression Continue to progress workloads to maintain intensity without signs/symptoms of physical distress.   Average METs 6     Resistance Training   Training Prescription Yes   Weight 10   Reps 10-15     Treadmill   MPH 3.8   Grade 4.5   Minutes 15   METs 6.27     REL-XR   Level 10   Minutes 15   METs 6      Nutrition:  Target Goals: Understanding of nutrition guidelines, daily intake of sodium '1500mg'$ , cholesterol '200mg'$ , calories 30% from fat and 7% or less from saturated fats, daily to have 5 or more servings of fruits and vegetables.  Biometrics:    Nutrition Therapy Plan and Nutrition Goals:      Nutrition Therapy & Goals - 02/01/16 1618      Personal Nutrition Goals   Personal Goal #1 Cont to eat healthy. Prefers to not meet individually with the Cardiac Rehab Registered Dietician.      Nutrition Discharge: Rate Your Plate Scores:     Nutrition Assessments - 01/12/16 1533      Rate Your Plate Scores   Pre Score 69   Pre Score % 77 %      Nutrition Goals Re-Evaluation:   Psychosocial: Target Goals: Acknowledge presence or absence of depression, maximize coping skills, provide positive support system. Participant is able to verbalize types and ability to use techniques and skills needed for reducing stress and depression.  Initial Review & Psychosocial Screening:     Initial Psych Review & Screening - 01/10/16 [place]? Yes  Wife     Barriers   Psychosocial barriers to participate in program There are no identifiable barriers or psychosocial needs.;The patient should benefit from training in stress management and relaxation.     Screening Interventions   Interventions Encouraged to exercise      Quality of Life Scores:  Quality of Life - 01/10/16 1415      Quality of Life Scores   Health/Function Pre 28.87 %   Socioeconomic Pre 30 %   Psych/Spiritual Pre 30 %   Family Pre 30 %   GLOBAL Pre 29.5 %      PHQ-9: Recent Review Flowsheet Data    Depression screen [facility] 2/9 01/10/2016   Decreased Interest 0   Down, Depressed, Hopeless 0   PHQ - 2 Score 0   Altered sleeping 0   Tired, decreased energy 0   Change in appetite 0   Feeling bad or failure about yourself  0   Trouble concentrating 0   Moving slowly or fidgety/restless 0   Suicidal thoughts 0   PHQ-9 Score 0   Difficult doing work/chores Not difficult at all      Psychosocial Evaluation and Intervention:     Psychosocial Evaluation - 02/12/16 1711      Psychosocial Evaluation & Interventions   Interventions Encouraged to exercise with the  program and follow exercise prescription   Comments Counselor met with Mr. Matera today for initial psychosocial evaluation.   He reports this is his second time in this Cardiac Rehab program.  Mr. Nazir is 75 years old and had a stent inserted in early June.  He states his sleep and appetite are good and he denies a history of depression or anxiety or any current symptoms.  Mr. Nofziger has stress mostly at work, but he states his mood is positive most of the time.  He has goals to finish this program and be overall healthier.  Counselor encouraged consistency in exercising following completion of this program.        Psychosocial Re-Evaluation:     Psychosocial Re-Evaluation    [place] Name 02/28/16 1717             Psychosocial Re-Evaluation   Comments Counselor follow up with Mr. Wanda Plump "Red" today stating he is experiencing a lot of progress since coming into this program.  He reports the tightness in his chest is gone and he has loosened up in that area as he works out now so no more discomfort.  He reports he sleeps well and increased stamina since starting in Cardiac Rehab.  Counselor commended Red for his commitment to exercise and  progress made.            Vocational Rehabilitation: Provide vocational rehab assistance to qualifying candidates.   Vocational Rehab Evaluation & Intervention:     Vocational Rehab - 01/10/16 1244      Initial Vocational Rehab Evaluation & Intervention   Assessment shows need for Vocational Rehabilitation No      Education: Education Goals: Education classes will be provided on a weekly basis, covering required topics. Participant will state understanding/return demonstration of topics presented.  Learning Barriers/Preferences:     Learning Barriers/Preferences - 01/10/16 1241      Learning Barriers/Preferences   Learning Barriers None   Learning Preferences Individual Instruction      Education Topics: General Nutrition  Guidelines/Fats and Fiber: -Group instruction provided by verbal, written material, models and posters to present the general guidelines for heart healthy nutrition. Gives an explanation and review of dietary fats and fiber. Flowsheet Row Cardiac Rehab from 03/27/2016 in [facility] Cardiac and Pulmonary Rehab  Date  02/12/16  Educator  PI  Instruction Review Code  2- meets goals/outcomes      Controlling Sodium/Reading Food Labels: -Group verbal and written material supporting the  discussion of sodium use in heart healthy nutrition. Review and explanation with models, verbal and written materials for utilization of the food label. Flowsheet Row Cardiac Rehab from 03/27/2016 in [facility] Cardiac and Pulmonary Rehab  Date  02/19/16  Educator  PI  Instruction Review Code  2- meets goals/outcomes      Exercise Physiology & Risk Factors: - Group verbal and written instruction with models to review the exercise physiology of the cardiovascular system and associated critical values. Details cardiovascular disease risk factors and the goals associated with each risk factor.   Aerobic Exercise & Resistance Training: - Gives group verbal and written discussion on the health impact of inactivity. On the components of aerobic and resistive training programs and the benefits of this training and how to safely progress through these programs. Flowsheet Row Cardiac Rehab from 03/27/2016 in [facility] Cardiac and Pulmonary Rehab  Date  02/28/16  Educator  Nada Maclachlan  Instruction Review Code  2- meets goals/outcomes      Flexibility, Balance, General Exercise Guidelines: - Provides group verbal and written instruction on the benefits of flexibility and balance training programs. Provides general exercise guidelines with specific guidelines to those with heart or lung disease. Demonstration and skill practice provided.   Stress Management: - Provides group verbal and written instruction about the health risks of  elevated stress, cause of high stress, and healthy ways to reduce stress. Flowsheet Row Cardiac Rehab from 03/27/2016 in [facility] Cardiac and Pulmonary Rehab  Date  03/06/16  Educator  Kathreen Cornfield, [facility]  Instruction Review Code  2- meets goals/outcomes      Depression: - Provides group verbal and written instruction on the correlation between heart/lung disease and depressed mood, treatment options, and the stigmas associated with seeking treatment.   Anatomy & Physiology of the Heart: - Group verbal and written instruction and models provide basic cardiac anatomy and physiology, with the coronary electrical and arterial systems. Review of: AMI, Angina, Valve disease, Heart Failure, Cardiac Arrhythmia, Pacemakers, and the ICD. Flowsheet Row Cardiac Rehab from 03/27/2016 in [facility] Cardiac and Pulmonary Rehab  Date  01/15/16  Educator  DW  Instruction Review Code  2- meets goals/outcomes      Cardiac Procedures: - Group verbal and written instruction and models to describe the testing methods done to diagnose heart disease. Reviews the outcomes of the test results. Describes the treatment choices: Medical Management, Angioplasty, or Coronary Bypass Surgery.   Cardiac Medications: - Group verbal and written instruction to review commonly prescribed medications for heart disease. Reviews the medication, class of the drug, and side effects. Includes the steps to properly store meds and maintain the prescription regimen. Flowsheet Row Cardiac Rehab from 03/27/2016 in [facility] Cardiac and Pulmonary Rehab  Date  03/27/16  Educator  SB  Instruction Review Code  2- meets goals/outcomes      Go Sex-Intimacy & Heart Disease, Get SMART - Goal Setting: - Group verbal and written instruction through game format to discuss heart disease and the return to sexual intimacy. Provides group verbal and written material to discuss and apply goal setting through the application of the S.M.A.R.T. Method.   Other Matters  of the Heart: - Provides group verbal, written materials and models to describe Heart Failure, Angina, Valve Disease, and Diabetes in the realm of heart disease. Includes description of the disease process and treatment options available to the cardiac patient.   Exercise & Equipment Safety: - Individual verbal instruction and demonstration of equipment use and safety with  use of the equipment. Flowsheet Row Cardiac Rehab from 03/27/2016 in [facility] Cardiac and Pulmonary Rehab  Date  03/20/16  Educator  C. EnterkinRN  Instruction Review Code  2- meets goals/outcomes      Infection Prevention: - Provides verbal and written material to individual with discussion of infection control including proper hand washing and proper equipment cleaning during exercise session. Flowsheet Row Cardiac Rehab from 03/27/2016 in [facility] Cardiac and Pulmonary Rehab  Date  01/10/16  Educator  SB  Instruction Review Code  2- meets goals/outcomes      Falls Prevention: - Provides verbal and written material to individual with discussion of falls prevention and safety. Flowsheet Row Cardiac Rehab from 03/27/2016 in [facility] Cardiac and Pulmonary Rehab  Date  01/10/16  Educator  SB  Instruction Review Code  2- meets goals/outcomes      Diabetes: - Individual verbal and written instruction to review signs/symptoms of diabetes, desired ranges of glucose level fasting, after meals and with exercise. Advice that pre and post exercise glucose checks will be done for 3 sessions at entry of program.    Knowledge Questionnaire Score:     Knowledge Questionnaire Score - 01/10/16 1241      Knowledge Questionnaire Score   Pre Score 20/28      Core Components/Risk Factors/Patient Goals at Admission:     Personal Goals and Risk Factors at Admission - 01/10/16 1247      Core Components/Risk Factors/Patient Goals on Admission   Hypertension Yes   Intervention Provide education on lifestyle modifcations including regular  physical activity/exercise, weight management, moderate sodium restriction and increased consumption of fresh fruit, vegetables, and low fat dairy, alcohol moderation, and smoking cessation.;Monitor prescription use compliance.   Expected Outcomes Short Term: Continued assessment and intervention until BP is < 140/2m HG in hypertensive participants. < 130/850mHG in hypertensive participants with diabetes, heart failure or chronic kidney disease.;Long Term: Maintenance of blood pressure at goal levels.      Core Components/Risk Factors/Patient Goals Review:      Goals and Risk Factor Review    Row Name 02/01/16 1645 02/22/16 1644 02/29/16 1807 02/29/16 1809 02/29/16 1819     Core Components/Risk Factors/Patient Goals Review   Personal Goals Review Hypertension;Lipids;Sedentary Sedentary;Increase Strength and Stamina Hypertension;Lipids  - Lipids;Hypertension   Review Mr FoSchalktates that he measures BP at home and it has been around 137/70's, last cholesterol check was in April and was 150 total.  He has retuned to work and using a weed eater at home and has done fine with lifting at work.  He states he is pre-diabetic and measures BG at home.  He will follow up with his Dr at next 6 month check up.  Home exercise program discussed with Mr FoChavarinith verbalization of understand. - - Lipids:  [name] has not had lipid panel since April.  His insurance would not continue paying for Vytorin 10/40.  [name] is now taking Simvastatin 40 mg once a day.  [name] reports that he feels better just being on Simvastatin.  [name] will have his cholesterol checked again in October 2017 when he sees his primary care physician.  BP upon check in on 02/28/16 was 140/60.  [name] was coming in from work.  Post exercise BP 118/70.  [name] reports checking his BP at home and states it usually ranges from 120s to 130s over 60s to 70s.     Expected Outcomes  -  -  -  - Continue Cardiac Rehab  to achieve goals of controlling hypertension  and cholesterol.     [place] Name 03/14/16 1017             Core Components/Risk Factors/Patient Goals Review   Personal Goals Review Hypertension;Lipids       Review Mr Ahles's BP measuremnts have all been within normal limits for exrecise and at rest. He checks it 3 times per week at home.  He has not had cholesterol measured recently but continues to use meds as prescribed.       Expected Outcomes Mr Cannata will continue to keep BP and lipids within ranges recommended to reduce risk of furture heart disease.          Core Components/Risk Factors/Patient Goals at Discharge (Final Review):      Goals and Risk Factor Review - 03/14/16 1017      Core Components/Risk Factors/Patient Goals Review   Personal Goals Review Hypertension;Lipids   Review Mr Hibbitts's BP measuremnts have all been within normal limits for exrecise and at rest. He checks it 3 times per week at home.  He has not had cholesterol measured recently but continues to use meds as prescribed.   Expected Outcomes Mr [place] will continue to keep BP and lipids within ranges recommended to reduce risk of furture heart disease.      ITP Comments:     ITP Comments    Row Name 01/10/16 1416 02/01/16 1619 02/07/16 0833 03/06/16 0823 03/14/16 1259   ITP Comments Initial medica review completed. ITP created.  Documentation of diagnosis found in [facility] discharge summary 12/21/2015 Cont to eat healthy. Prefers to not meet individually with the Cardiac Rehab Registered Dietician. 30 day review. Continue with ITP 30 day review. Continue with ITP unless changes noted by Medical Director at signature of review. Mr Petteway is progressing well with exercise.   [place] Name 03/28/16 1054 04/03/16 0658         ITP Comments [name] continues to do well with exercise. 30 day review. Continue with ITP unless changes noted by Medical Director at signature of review.         Comments:

## 2016-04-03 NOTE — Progress Notes (Signed)
Daily Session Note  Patient Details  Name: Kevin Stewart MRN: [id number] Date of Birth: 09-Jul-1941 Referring Provider:   Flowsheet Row Cardiac Rehab from 01/10/2016 in [facility] Card[facility]  Referring Provider  [name]      Encounter Date: 04/03/2016  Check In:     Session Check In - 04/03/16 1658      Check-In   Location [facility]-Cardiac & Pulmonary Rehab   Staff Present Nyoka Cowden, RN, BSN, MA;Cythia Bachtel, RN, Vickki Hearing, BA, ACSM CEP, Exercise Physiologist   Supervising physician immediately available to respond to emergencies See telemetry face sheet for immediately available ER MD   Medication changes reported     No   Fall or balance concerns reported    No   Warm-up and Cool-down Performed on first and last piece of equipment   Resistance Training Performed Yes   VAD Patient? No     Pain Assessment   Currently in Pain? No/denies         Goals Met:  Proper associated with RPD/PD & O2 Sat No report of cardiac concerns or symptoms  Goals Unmet:  Not Applicable  Comments:     Dr. Emily Filbert is Medical Director for [facility].

## 2016-04-04 ENCOUNTER — Encounter: Payer: Medicare Other | Admitting: *Deleted

## 2016-04-04 VITALS — Ht 71.0 in | Wt 170.0 lb

## 2016-04-04 DIAGNOSIS — Z955 Presence of coronary angioplasty implant and graft: Secondary | ICD-10-CM

## 2016-04-04 NOTE — Progress Notes (Signed)
Daily Session Note  Patient Details  Name: Kevin Stewart MRN: [id number] Date of Birth: Feb 03, 1941 Referring Provider:   Flowsheet Row Cardiac Rehab from 01/10/2016 in [facility] Card[facility]  Referring Provider  [name]      Encounter Date: 04/04/2016  Check In:     Session Check In - 04/04/16 1652      Check-In   Location [facility]-Cardiac [facility] Pul[facility] Rehab   Staff Present Nyoka Cowden, RN, BSN, MA;Fable Huisman, RN, Vickki Hearing, BA, ACSM CEP, Exercise Physiologist   Supervising physician immediately available to respond to emergencies See telemetry face sheet for immediately available ER MD   Medication changes reported     No   Fall or balance concerns reported    No   Warm-up and Cool-down Performed on first and last piece of equipment   Resistance Training Performed Yes   VAD Patient? No     Pain Assessment   Currently in Pain? No/denies         Goals Met:  Proper associated with RPD/PD & O2 Sat Exercise tolerated well  Goals Unmet:  Not Applicable  Comments:     Dr. Emily Filbert is Medical Director for [place].

## 2016-04-08 ENCOUNTER — Encounter: Payer: Medicare Other | Admitting: *Deleted

## 2016-04-09 NOTE — Patient Instructions (Signed)
ca Discharge Instructions  Patient Details  Name: Kevin Stewart MRN: [id number] Date of Birth: 12-20-1940 Referring Provider:  [name], Peter M, MD   Number of Visits:   Reason for Discharge:  Patient reached a stable level of exercise. Patient independent in their exercise.  Smoking History:  History  Smoking Status  . Former Smoker  . Packs/day: 2.00  . Years: 10.00  . Types: Cigarettes  . Quit date: 07/10/1973  Smokeless Tobacco  . Former Systems developer  . Types: Chew    Comment: "chewed a little; stopped in 06/1973"    Diagnosis:  No diagnosis found.  Initial Exercise Prescription:     Initial Exercise Prescription - 01/10/16 1300      Date of Initial Exercise RX and Referring Provider   Date 01/10/16   Referring Provider [name]     Treadmill   MPH 3   Grade 0.5   Minutes 15   METs 3.5     Bike   Level 1.5   Minutes 15     Recumbant Bike   Level 2   RPM 50   Watts 40   Minutes 15   METs 3.5     NuStep   Level 3   Watts 60   Minutes 15   METs 3.5     Elliptical   Level 1   Speed 50   Minutes 5     REL-XR   Level 2   Watts 60   Minutes 15   METs 3.5     T5 Nustep   Level 2   Watts 60   Minutes 15   METs 3.5     Biostep-RELP   Level 3   Watts 60   Minutes 15   METs 3.5     Prescription Details   Frequency (times per week) 3   Duration Progress to 45 minutes of aerobic exercise without signs/symptoms of physical distress     Intensity   THRR 40-80% of Max Heartrate 99-131   Ratings of Perceived Exertion 11-13   Perceived Dyspnea 0-4     Progression   Progression Continue to progress workloads to maintain intensity without signs/symptoms of physical distress.     Resistance Training   Training Prescription Yes   Weight 6   Reps 10-15      Discharge Exercise Prescription (Final Exercise Prescription Changes):     Exercise Prescription Changes - 03/28/16 1000      Exercise Review   Progression Yes     Response to  Exercise   Blood Pressure (Admit) 128/70   Blood Pressure (Exercise) 164/66   Blood Pressure (Exit) 128/66   Heart Rate (Admit) 87 bpm   Heart Rate (Exercise) 112 bpm   Heart Rate (Exit) 71 bpm   Rating of Perceived Exertion (Exercise) 12   Symptoms none     Progression   Progression Continue to progress workloads to maintain intensity without signs/symptoms of physical distress.   Average METs 6     Resistance Training   Training Prescription Yes   Weight 10   Reps 10-15     Treadmill   MPH 3.8   Grade 4.5   Minutes 15   METs 6.27     REL-XR   Level 10   Minutes 15   METs 6      Functional Capacity:     6 Minute Walk    Row Name 01/10/16 1354 04/05/16 1202       6 Minute  Walk   Phase  - Discharge    Distance 1600 feet 1910 feet    Distance % Change  - 16 %    Walk Time 6 minutes 6 minutes    # of Rest Breaks  - 0    MPH 3 3.6    METS 3.85 4.49    RPE 13 11    VO2 Peak 13.5 15.7    Symptoms No No    Resting HR 67 bpm 89 bpm    Resting BP 130/64 132/70    Max Ex. HR 102 bpm 113 bpm    Max Ex. BP 172/76 162/72       Quality of Life:     Quality of Life - 01/10/16 1415      Quality of Life Scores   Health/Function Pre 28.87 %   Socioeconomic Pre 30 %   Psych/Spiritual Pre 30 %   Family Pre 30 %   GLOBAL Pre 29.5 %      Personal Goals: Goals established at orientation with interventions provided to work toward goal.     Personal Goals and Risk Factors at Admission - 01/10/16 1247      Core Components/Risk Factors/Patient Goals on Admission   Hypertension Yes   Intervention Provide education on lifestyle modifcations including regular physical activity/exercise, weight management, moderate sodium restriction and increased consumption of fresh fruit, vegetables, and low fat dairy, alcohol moderation, and smoking cessation.;Monitor prescription use compliance.   Expected Outcomes Short Term: Continued assessment and intervention until BP is <  140/43mm HG in hypertensive participants. < 130/34mm HG in hypertensive participants with diabetes, heart failure or chronic kidney disease.;Long Term: Maintenance of blood pressure at goal levels.       Personal Goals Discharge:     Goals and Risk Factor Review - 03/14/16 1017      Core Components/Risk Factors/Patient Goals Review   Personal Goals Review Hypertension;Lipids   Review Mr Guynes's BP measuremnts have all been within normal limits for exrecise and at rest. He checks it 3 times per week at home.  He has not had cholesterol measured recently but continues to use meds as prescribed.   Expected Outcomes Mr Roughton will continue to keep BP and lipids within ranges recommended to reduce risk of furture heart disease.      Nutrition & Weight - Outcomes:      Post Biometrics - 04/05/16 1204       Post  Biometrics   Height 5\' 11"  (1.803 m)   Weight 170 lb (77.1 kg)   BMI (Calculated) 23.8      Nutrition:     Nutrition Therapy & Goals - 02/01/16 1618      Personal Nutrition Goals   Personal Goal #1 Cont to eat healthy. Prefers to not meet individually with the Cardiac Rehab Registered Dietician.      Nutrition Discharge:     Nutrition Assessments - 01/12/16 1533      Rate Your Plate Scores   Pre Score 69   Pre Score % 77 %      Education Questionnaire Score:     Knowledge Questionnaire Score - 01/10/16 1241      Knowledge Questionnaire Score   Pre Score 20/28      Goals reviewed with patient; copy given to patient.

## 2016-04-10 ENCOUNTER — Encounter: Payer: Medicare Other | Admitting: *Deleted

## 2016-04-10 DIAGNOSIS — Z955 Presence of coronary angioplasty implant and graft: Secondary | ICD-10-CM

## 2016-04-10 NOTE — Progress Notes (Signed)
Cardiac Individual Treatment Plan  Patient Details  Name: Kevin Stewart MRN: [id number] Date of Birth: 1940/08/27 Referring Provider:   Flowsheet Row Cardiac Rehab from 01/10/2016 in [facility] Cardiac and Pulmonary Rehab  Referring Provider  [name]      Initial Encounter Date:  Flowsheet Row Cardiac Rehab from 01/10/2016 in [facility] Cardiac and Pulmonary Rehab  Date  01/10/16  Referring Provider  [name]      Visit Diagnosis: Status post coronary artery stent placement  Patient's Home Medications on Admission:  Current Outpatient Prescriptions:  .  aspirin 81 MG tablet, Take 81 mg by mouth daily., Disp: , Rfl:  .  carvedilol (COREG) 12.5 MG tablet, Take 12.5 mg by mouth 2 (two) times daily with a meal., Disp: , Rfl:  .  clopidogrel (PLAVIX) 75 MG tablet, Take 75 mg by mouth daily., Disp: , Rfl:  .  enalapril-hydrochlorothiazide (VASERETIC) 10-25 MG per tablet, Take 1 tablet by mouth daily. , Disp: , Rfl:  .  ezetimibe-simvastatin (VYTORIN) 10-40 MG per tablet, Take 1 tablet by mouth at bedtime. Reported on 02/08/2016, Disp: , Rfl:  .  fluticasone (FLONASE) 50 MCG/ACT nasal spray, Place 1 spray into both nostrils as needed., Disp: , Rfl: 0 .  naphazoline-pheniramine (EYE ALLERGY RELIEF) 0.025-0.3 % ophthalmic solution, Place 2 drops into both eyes 2 (two) times daily., Disp: , Rfl:  .  nitroGLYCERIN (NITROSTAT) 0.4 MG SL tablet, Place 1 tablet (0.4 mg total) under the tongue every 5 (five) minutes as needed for chest pain (up to 3 doses)., Disp: 25 tablet, Rfl: 3 .  Omega-3 Fatty Acids (OMEGA-3 IQ PO), Take 1 tablet by mouth daily., Disp: , Rfl:  .  simvastatin (ZOCOR) 40 MG tablet, , Disp: , Rfl: 3  Past Medical History: Past Medical History:  Diagnosis Date  . Arthritis    "hands" (12/20/2015)  . Basal cell carcinoma, face   . Coronary artery disease    a. 2003 - light MI per patient; s/p Cypher DES to prox LAD in [facility].  Marland Kitchen Hx of adenomatous colonic polyps   . Hyperlipemia   .  Hyperlipidemia   . Hypertension   . Kidney stones    "passed them"  . Melanoma of right side of neck ([place])    removed posterior neck [id number]  . Migraine    "stopped in 2001 when I had my heart attack"  . Myocardial infarction ([place])   . Pre-diabetes   . Wears dentures    upper-lower partial  . Wears glasses     Tobacco Use: History  Smoking Status  . Former Smoker  . Packs/day: 2.00  . Years: 10.00  . Types: Cigarettes  . Quit date: 07/10/1973  Smokeless Tobacco  . Former Systems developer  . Types: Chew    Comment: "chewed a little; stopped in 06/1973"    Labs: Recent Review Flowsheet Data    Labs for ITP Cardiac and Pulmonary Rehab Latest Ref Rng & Units 07/21/2012   TCO2 0 - 100 mmol/L 27       Exercise Target Goals:    Exercise Program Goal: Individual exercise prescription set with THRR, safety & activity barriers. Participant demonstrates ability to understand and report RPE using BORG scale, to self-measure pulse accurately, and to acknowledge the importance of the exercise prescription.  Exercise Prescription Goal: Starting with aerobic activity 30 plus minutes a day, 3 days per week for initial exercise prescription. Provide home exercise prescription and guidelines that participant acknowledges understanding prior to discharge.  Activity Barriers & Risk Stratification:     Activity Barriers & Cardiac Risk Stratification - 01/10/16 1240      Activity Barriers & Cardiac Risk Stratification   Activity Barriers Chest Pain/Angina;Shortness of Breath  Angina symptoms continue, MD is aware of symptoms.  Has used NTG to relieve symptoms.    Cardiac Risk Stratification High      6 Minute Walk:     6 Minute Walk    Row Name 01/10/16 1354 04/05/16 1202       6 Minute Walk   Phase  - Discharge    Distance 1600 feet 1910 feet    Distance % Change  - 16 %    Walk Time 6 minutes 6 minutes    # of Rest Breaks  - 0    MPH 3 3.6    METS 3.85 4.49    RPE 13 11     VO2 Peak 13.5 15.7    Symptoms No No    Resting HR 67 bpm 89 bpm    Resting BP 130/64 132/70    Max Ex. HR 102 bpm 113 bpm    Max Ex. BP 172/76 162/72       Initial Exercise Prescription:     Initial Exercise Prescription - 01/10/16 1300      Date of Initial Exercise RX and Referring Provider   Date 01/10/16   Referring Provider [place]     Treadmill   MPH 3   Grade 0.5   Minutes 15   METs 3.5     Bike   Level 1.5   Minutes 15     Recumbant Bike   Level 2   RPM 50   Watts 40   Minutes 15   METs 3.5     NuStep   Level 3   Watts 60   Minutes 15   METs 3.5     Elliptical   Level 1   Speed 50   Minutes 5     REL-XR   Level 2   Watts 60   Minutes 15   METs 3.5     T5 Nustep   Level 2   Watts 60   Minutes 15   METs 3.5     Biostep-RELP   Level 3   Watts 60   Minutes 15   METs 3.5     Prescription Details   Frequency (times per week) 3   Duration Progress to 45 minutes of aerobic exercise without signs/symptoms of physical distress     Intensity   THRR 40-80% of Max Heartrate 99-131   Ratings of Perceived Exertion 11-13   Perceived Dyspnea 0-4     Progression   Progression Continue to progress workloads to maintain intensity without signs/symptoms of physical distress.     Resistance Training   Training Prescription Yes   Weight 6   Reps 10-15      Perform Capillary Blood Glucose checks as needed.  Exercise Prescription Changes:     Exercise Prescription Changes    Row Name 01/10/16 1300 01/19/16 1000 01/31/16 1300 02/15/16 1400 02/29/16 1400     Exercise Review   Progression  -  - Yes Yes Yes     Response to Exercise   Blood Pressure (Admit) 130/64 142/70 142/82 140/80 140/60   Blood Pressure (Exercise) 172/76 158/62 172/78 142/80 184/66   Blood Pressure (Exit) 162/82 124/68 146/72 128/70 118/70   Heart Rate (Admit) 65 bpm 62 bpm 63 bpm 80  bpm 69 bpm   Heart Rate (Exercise) 102 bpm 94 bpm 111 bpm 120 bpm 106 bpm   Heart Rate  (Exit) 65 bpm 77 bpm 76 bpm 72 bpm 80 bpm   Oxygen Saturation (Admit) 102 %  -  -  -  -   Oxygen Saturation (Exercise) 83 %  -  -  -  -   Rating of Perceived Exertion (Exercise)  -  - '12 12 12   '$ Perceived Dyspnea (Exercise) 13  -  -  -  -   Symptoms  -  - none none none     Progression   Progression  -  -  - Continue to progress workloads to maintain intensity without signs/symptoms of physical distress. Continue to progress workloads to maintain intensity without signs/symptoms of physical distress.   Average METs  -  -  - 6 5.5     Resistance Training   Training Prescription  - Yes Yes Yes Yes   Weight  - '10 10 10 10   '$ Reps  - 10-15 10-15 10-15 10-15     Interval Training   Interval Training  - No  -  - Yes     Treadmill   MPH  - 3.1 3 3.8 3.8   Grade  - 1 4.5 4.5 4.5   Minutes  - '22 10 15 15   '$ METs  - 3.8 5.1 6 6.27     NuStep   Level  - '5 7 6 6   '$ Watts  - 74  -  -  -   Minutes  - '15 25 15 15   '$ METs  - 3.6 5.3  -  -     REL-XR   Level  -  -  -  - 8     Home Exercise Plan   Plans to continue exercise at  -  -  -  - Home   Frequency  -  -  -  - Add 2 additional days to program exercise sessions.   [place] Name 03/14/16 1200 03/28/16 1000           Exercise Review   Progression Yes Yes        Response to Exercise   Blood Pressure (Admit) 136/64 128/70      Blood Pressure (Exercise) 186/64 164/66      Blood Pressure (Exit) 132/66 128/66      Heart Rate (Admit) 90 bpm 87 bpm      Heart Rate (Exercise) 133 bpm 112 bpm      Heart Rate (Exit) 68 bpm 71 bpm      Rating of Perceived Exertion (Exercise) 13 12      Symptoms none none        Progression   Progression Continue to progress workloads to maintain intensity without signs/symptoms of physical distress. Continue to progress workloads to maintain intensity without signs/symptoms of physical distress.      Average METs 6.2 6        Resistance Training   Training Prescription Yes Yes      Weight 10 10      Reps  10-15 10-15        Interval Training   Interval Training Yes  -      Equipment NuStep  -        Treadmill   MPH 3.8 3.8      Grade 4.5 4.5      Minutes 15 15  METs 6.27 6.27        NuStep   Level 6  -      Minutes 15  -      METs 6.2  -        REL-XR   Level 10 10      Minutes 15 15      METs 6.5 6         Exercise Comments:     Exercise Comments    Row Name 01/19/16 1016 01/31/16 1352 02/15/16 1404 02/29/16 1418 03/14/16 1259   Exercise Comments Salik is progressing well with exercise and has completed all sessions with no symtpoms. Mr Canterbury has progressed very well with exercise and maintains his HR within recommended range. [name] is progressing well with exercise. Red is progressing well and will try to add another day of walking at home since he is attending only twice per week. Mr Czajkowski is progressing well with exercise.   [place] Name 03/28/16 1054           Exercise Comments [name] continues to do well with exercise.          Discharge Exercise Prescription (Final Exercise Prescription Changes):     Exercise Prescription Changes - 03/28/16 1000      Exercise Review   Progression Yes     Response to Exercise   Blood Pressure (Admit) 128/70   Blood Pressure (Exercise) 164/66   Blood Pressure (Exit) 128/66   Heart Rate (Admit) 87 bpm   Heart Rate (Exercise) 112 bpm   Heart Rate (Exit) 71 bpm   Rating of Perceived Exertion (Exercise) 12   Symptoms none     Progression   Progression Continue to progress workloads to maintain intensity without signs/symptoms of physical distress.   Average METs 6     Resistance Training   Training Prescription Yes   Weight 10   Reps 10-15     Treadmill   MPH 3.8   Grade 4.5   Minutes 15   METs 6.27     REL-XR   Level 10   Minutes 15   METs 6      Nutrition:  Target Goals: Understanding of nutrition guidelines, daily intake of sodium '1500mg'$ , cholesterol '200mg'$ , calories 30% from fat and 7% or less  from saturated fats, daily to have 5 or more servings of fruits and vegetables.  Biometrics:      Post Biometrics - 04/05/16 1204       Post  Biometrics   Height '5\' 11"'$  (1.803 m)   Weight 170 lb (77.1 kg)   BMI (Calculated) 23.8      Nutrition Therapy Plan and Nutrition Goals:     Nutrition Therapy & Goals - 02/01/16 1618      Personal Nutrition Goals   Personal Goal #1 Cont to eat healthy. Prefers to not meet individually with the Cardiac Rehab Registered Dietician.      Nutrition Discharge: Rate Your Plate Scores:     Nutrition Assessments - 04/10/16 1817      Rate Your Plate Scores   Post Score 64   Post Score % 73 %      Nutrition Goals Re-Evaluation:   Psychosocial: Target Goals: Acknowledge presence or absence of depression, maximize coping skills, provide positive support system. Participant is able to verbalize types and ability to use techniques and skills needed for reducing stress and depression.  Initial Review & Psychosocial Screening:     Initial Psych Review & Screening -  01/10/16 [place]? Yes  Wife     Barriers   Psychosocial barriers to participate in program There are no identifiable barriers or psychosocial needs.;The patient should benefit from training in stress management and relaxation.     Screening Interventions   Interventions Encouraged to exercise      Quality of Life Scores:     Quality of Life - 01/10/16 1415      Quality of Life Scores   Health/Function Pre 28.87 %   Socioeconomic Pre 30 %   Psych/Spiritual Pre 30 %   Family Pre 30 %   GLOBAL Pre 29.5 %      PHQ-9: Recent Review Flowsheet Data    Depression screen [facility] 2/9 04/10/2016 01/10/2016   Decreased Interest 0 0   Down, Depressed, Hopeless 0 0   PHQ - 2 Score 0 0   Altered sleeping 0 0   Tired, decreased energy 0 0   Change in appetite 0 0   Feeling bad or failure about yourself  0 0   Trouble concentrating 0 0    Moving slowly or fidgety/restless 0 0   Suicidal thoughts 0 0   PHQ-9 Score 0 0   Difficult doing work/chores - Not difficult at all      Psychosocial Evaluation and Intervention:     Psychosocial Evaluation - 02/12/16 1711      Psychosocial Evaluation & Interventions   Interventions Encouraged to exercise with the program and follow exercise prescription   Comments Counselor met with Mr. Moeller today for initial psychosocial evaluation.   He reports this is his second time in this Cardiac Rehab program.  Mr. Burston is 75 years old and had a stent inserted in early June.  He states his sleep and appetite are good and he denies a history of depression or anxiety or any current symptoms.  Mr. Sherrow has stress mostly at work, but he states his mood is positive most of the time.  He has goals to finish this program and be overall healthier.  Counselor encouraged consistency in exercising following completion of this program.        Psychosocial Re-Evaluation:     Psychosocial Re-Evaluation    [place] Name 02/28/16 1717 04/10/16 1818           Psychosocial Re-Evaluation   Comments Counselor follow up with Mr. Wanda Plump "Red" today stating he is experiencing a lot of progress since coming into this program.  He reports the tightness in his chest is gone and he has loosened up in that area as he works out now so no more discomfort.  He reports he sleeps well and increased stamina since starting in Cardiac Rehab.  Counselor commended Red for his commitment to exercise and  progress made.   Dearl said he felt this Cardiac Rehab program helped him "and he enjoyed it".          Vocational Rehabilitation: Provide vocational rehab assistance to qualifying candidates.   Vocational Rehab Evaluation & Intervention:     Vocational Rehab - 01/10/16 1244      Initial Vocational Rehab Evaluation & Intervention   Assessment shows need for Vocational Rehabilitation No      Education: Education  Goals: Education classes will be provided on a weekly basis, covering required topics. Participant will state understanding/return demonstration of topics presented.  Learning Barriers/Preferences:     Learning Barriers/Preferences - 01/10/16 1241  Learning Barriers/Preferences   Learning Barriers None   Learning Preferences Individual Instruction      Education Topics: General Nutrition Guidelines/Fats and Fiber: -Group instruction provided by verbal, written material, models and posters to present the general guidelines for heart healthy nutrition. Gives an explanation and review of dietary fats and fiber. Flowsheet Row Cardiac Rehab from 04/10/2016 in [facility] Cardiac and Pulmonary Rehab  Date  02/12/16  Educator  PI  Instruction Review Code  2- meets goals/outcomes      Controlling Sodium/Reading Food Labels: -Group verbal and written material supporting the discussion of sodium use in heart healthy nutrition. Review and explanation with models, verbal and written materials for utilization of the food label. Flowsheet Row Cardiac Rehab from 04/10/2016 in [facility] Cardiac and Pulmonary Rehab  Date  02/19/16  Educator  PI  Instruction Review Code  2- meets goals/outcomes      Exercise Physiology & Risk Factors: - Group verbal and written instruction with models to review the exercise physiology of the cardiovascular system and associated critical values. Details cardiovascular disease risk factors and the goals associated with each risk factor.   Aerobic Exercise & Resistance Training: - Gives group verbal and written discussion on the health impact of inactivity. On the components of aerobic and resistive training programs and the benefits of this training and how to safely progress through these programs. Flowsheet Row Cardiac Rehab from 04/10/2016 in [facility] Cardiac and Pulmonary Rehab  Date  02/28/16  Educator  Roney Jaffe  Instruction Review Code  2- meets goals/outcomes       Flexibility, Balance, General Exercise Guidelines: - Provides group verbal and written instruction on the benefits of flexibility and balance training programs. Provides general exercise guidelines with specific guidelines to those with heart or lung disease. Demonstration and skill practice provided.   Stress Management: - Provides group verbal and written instruction about the health risks of elevated stress, cause of high stress, and healthy ways to reduce stress. Flowsheet Row Cardiac Rehab from 04/10/2016 in [facility] Cardiac and Pulmonary Rehab  Date  03/06/16  Educator  Belva Crome, [facility]  Instruction Review Code  2- meets goals/outcomes      Depression: - Provides group verbal and written instruction on the correlation between heart/lung disease and depressed mood, treatment options, and the stigmas associated with seeking treatment. Flowsheet Row Cardiac Rehab from 04/10/2016 in [facility] Cardiac and Pulmonary Rehab  Date  04/10/16  Educator  [facility]  Instruction Review Code  2- meets goals/outcomes      Anatomy & Physiology of the Heart: - Group verbal and written instruction and models provide basic cardiac anatomy and physiology, with the coronary electrical and arterial systems. Review of: AMI, Angina, Valve disease, Heart Failure, Cardiac Arrhythmia, Pacemakers, and the ICD. Flowsheet Row Cardiac Rehab from 04/10/2016 in [facility] Cardiac and Pulmonary Rehab  Date  01/15/16  Educator  DW  Instruction Review Code  2- meets goals/outcomes      Cardiac Procedures: - Group verbal and written instruction and models to describe the testing methods done to diagnose heart disease. Reviews the outcomes of the test results. Describes the treatment choices: Medical Management, Angioplasty, or Coronary Bypass Surgery.   Cardiac Medications: - Group verbal and written instruction to review commonly prescribed medications for heart disease. Reviews the medication, class of the drug, and side effects.  Includes the steps to properly store meds and maintain the prescription regimen. Flowsheet Row Cardiac Rehab from 04/10/2016 in [facility] Cardiac and Pulmonary Rehab  Date  03/27/16  Educator  SB  Instruction Review Code  2- meets goals/outcomes      Go Sex-Intimacy & Heart Disease, Get SMART - Goal Setting: - Group verbal and written instruction through game format to discuss heart disease and the return to sexual intimacy. Provides group verbal and written material to discuss and apply goal setting through the application of the S.M.A.R.T. Method.   Other Matters of the Heart: - Provides group verbal, written materials and models to describe Heart Failure, Angina, Valve Disease, and Diabetes in the realm of heart disease. Includes description of the disease process and treatment options available to the cardiac patient.   Exercise & Equipment Safety: - Individual verbal instruction and demonstration of equipment use and safety with use of the equipment. Flowsheet Row Cardiac Rehab from 04/10/2016 in [facility] Cardiac and Pulmonary Rehab  Date  03/20/16  Educator  C. EnterkinRN  Instruction Review Code  2- meets goals/outcomes      Infection Prevention: - Provides verbal and written material to individual with discussion of infection control including proper hand washing and proper equipment cleaning during exercise session. Flowsheet Row Cardiac Rehab from 04/10/2016 in [facility] Cardiac and Pulmonary Rehab  Date  01/10/16  Educator  SB  Instruction Review Code  2- meets goals/outcomes      Falls Prevention: - Provides verbal and written material to individual with discussion of falls prevention and safety. Flowsheet Row Cardiac Rehab from 04/10/2016 in [facility] Cardiac and Pulmonary Rehab  Date  01/10/16  Educator  SB  Instruction Review Code  2- meets goals/outcomes      Diabetes: - Individual verbal and written instruction to review signs/symptoms of diabetes, desired ranges of glucose  level fasting, after meals and with exercise. Advice that pre and post exercise glucose checks will be done for 3 sessions at entry of program.    Knowledge Questionnaire Score:     Knowledge Questionnaire Score - 04/10/16 1820      Knowledge Questionnaire Score   Post Score 24      Core Components/Risk Factors/Patient Goals at Admission:     Personal Goals and Risk Factors at Admission - 01/10/16 1247      Core Components/Risk Factors/Patient Goals on Admission   Hypertension Yes   Intervention Provide education on lifestyle modifcations including regular physical activity/exercise, weight management, moderate sodium restriction and increased consumption of fresh fruit, vegetables, and low fat dairy, alcohol moderation, and smoking cessation.;Monitor prescription use compliance.   Expected Outcomes Short Term: Continued assessment and intervention until BP is < 140/74m HG in hypertensive participants. < 130/870mHG in hypertensive participants with diabetes, heart failure or chronic kidney disease.;Long Term: Maintenance of blood pressure at goal levels.      Core Components/Risk Factors/Patient Goals Review:      Goals and Risk Factor Review    Row Name 02/01/16 1645 02/22/16 1644 02/29/16 1807 02/29/16 1809 02/29/16 1819     Core Components/Risk Factors/Patient Goals Review   Personal Goals Review Hypertension;Lipids;Sedentary Sedentary;Increase Strength and Stamina Hypertension;Lipids  - Lipids;Hypertension   Review Mr FoKossmantates that he measures BP at home and it has been around 137/70's, last cholesterol check was in April and was 150 total.  He has retuned to work and using a weed eater at home and has done fine with lifting at work.  He states he is pre-diabetic and measures BG at home.  He will follow up with his Dr at next 6 month check up.  Home exercise program discussed  with Mr Trudo with verbalization of understand. - - Lipids:  [name] has not had lipid panel  since April.  His insurance would not continue paying for Vytorin 10/40.  [name] is now taking Simvastatin 40 mg once a day.  [name] reports that he feels better just being on Simvastatin.  [name] will have his cholesterol checked again in October 2017 when he sees his primary care physician.  BP upon check in on 02/28/16 was 140/60.  [name] was coming in from work.  Post exercise BP 118/70.  [name] reports checking his BP at home and states it usually ranges from 120s to 130s over 60s to 70s.     Expected Outcomes  -  -  -  - Continue Cardiac Rehab to achieve goals of controlling hypertension and cholesterol.     [place] Name 03/14/16 1017 04/10/16 1817           Core Components/Risk Factors/Patient Goals Review   Personal Goals Review Hypertension;Lipids  -      Review Mr Hisaw's BP measuremnts have all been within normal limits for exrecise and at rest. He checks it 3 times per week at home.  He has not had cholesterol measured recently but continues to use meds as prescribed. Julius "Red" said Cardiac Rehab has helped him. His blood pressure has been stable. He cont to check his blood sugars at times at home.       Expected Outcomes Mr Egger will continue to keep BP and lipids within ranges recommended to reduce risk of furture heart disease. Cont heart heathly lifestyle.          Core Components/Risk Factors/Patient Goals at Discharge (Final Review):      Goals and Risk Factor Review - 04/10/16 1817      Core Components/Risk Factors/Patient Goals Review   Review Jori Moll "Red" said Cardiac Rehab has helped him. His blood pressure has been stable. He cont to check his blood sugars at times at home.    Expected Outcomes Cont heart heathly lifestyle.       ITP Comments:     ITP Comments    Row Name 01/10/16 1416 02/01/16 1619 02/07/16 0833 03/06/16 0823 03/14/16 1259   ITP Comments Initial medica review completed. ITP created.  Documentation of diagnosis found in [facility] discharge summary 12/21/2015 Cont  to eat healthy. Prefers to not meet individually with the Cardiac Rehab Registered Dietician. 30 day review. Continue with ITP 30 day review. Continue with ITP unless changes noted by Medical Director at signature of review. Mr Wadsworth is progressing well with exercise.   [place] Name 03/28/16 1054 04/03/16 0658         ITP Comments [name] continues to do well with exercise. 30 day review. Continue with ITP unless changes noted by Medical Director at signature of review.         Comments: completed 36/36 sessions of Cardiac Rehab.

## 2016-04-10 NOTE — Progress Notes (Signed)
This encounter was created in error - please disregard.

## 2016-04-10 NOTE — Progress Notes (Signed)
Discharge Summary  Patient Details  Name: Kevin Stewart MRN: [id number] Date of Birth: [birth year] Referring Provider:   Flowsheet Row Cardiac Rehab from 01/10/2016 in [facility] Cardiac and Pulmonary Rehab  Referring Provider  [name]       Number of Visits: 30  Reason for Discharge:  Patient reached a stable level of exercise. Patient independent in their exercise.  Smoking History:  History  Smoking Status  . Former Smoker  . Packs/day: 2.00  . Years: 10.00  . Types: Cigarettes  . Quit date: 07/10/1973  Smokeless Tobacco  . Former Systems developer  . Types: Chew    Comment: "chewed a little; stopped in 06/1973"    Diagnosis:  Status post coronary artery stent placement  ADL UCSD:   Initial Exercise Prescription:     Initial Exercise Prescription - 01/10/16 1300      Date of Initial Exercise RX and Referring Provider   Date 01/10/16   Referring Provider [name]     Treadmill   MPH 3   Grade 0.5   Minutes 15   METs 3.5     Bike   Level 1.5   Minutes 15     Recumbant Bike   Level 2   RPM 50   Watts 40   Minutes 15   METs 3.5     NuStep   Level 3   Watts 60   Minutes 15   METs 3.5     Elliptical   Level 1   Speed 50   Minutes 5     REL-XR   Level 2   Watts 60   Minutes 15   METs 3.5     T5 Nustep   Level 2   Watts 60   Minutes 15   METs 3.5     Biostep-RELP   Level 3   Watts 60   Minutes 15   METs 3.5     Prescription Details   Frequency (times per week) 3   Duration Progress to 45 minutes of aerobic exercise without signs/symptoms of physical distress     Intensity   THRR 40-80% of Max Heartrate 99-131   Ratings of Perceived Exertion 11-13   Perceived Dyspnea 0-4     Progression   Progression Continue to progress workloads to maintain intensity without signs/symptoms of physical distress.     Resistance Training   Training Prescription Yes   Weight 6   Reps 10-15      Discharge Exercise Prescription (Final Exercise  Prescription Changes):     Exercise Prescription Changes - 03/28/16 1000      Exercise Review   Progression Yes     Response to Exercise   Blood Pressure (Admit) 128/70   Blood Pressure (Exercise) 164/66   Blood Pressure (Exit) 128/66   Heart Rate (Admit) 87 bpm   Heart Rate (Exercise) 112 bpm   Heart Rate (Exit) 71 bpm   Rating of Perceived Exertion (Exercise) 12   Symptoms none     Progression   Progression Continue to progress workloads to maintain intensity without signs/symptoms of physical distress.   Average METs 6     Resistance Training   Training Prescription Yes   Weight 10   Reps 10-15     Treadmill   MPH 3.8   Grade 4.5   Minutes 15   METs 6.27     REL-XR   Level 10   Minutes 15   METs 6      Functional Capacity:  [place] Name 01/10/16 1354 04/05/16 1202       6 Minute Walk   Phase  - Discharge    Distance 1600 feet 1910 feet    Distance % Change  - 16 %    Walk Time 6 minutes 6 minutes    # of Rest Breaks  - 0    MPH 3 3.6    METS 3.85 4.49    RPE 13 11    VO2 Peak 13.5 15.7    Symptoms No No    Resting HR 67 bpm 89 bpm    Resting BP 130/64 132/70    Max Ex. HR 102 bpm 113 bpm    Max Ex. BP 172/76 162/72       Psychological, QOL, Others - Outcomes: PHQ 2/9: Depression screen [facility] 2/9 04/10/2016 01/10/2016  Decreased Interest 0 0  Down, Depressed, Hopeless 0 0  PHQ - 2 Score 0 0  Altered sleeping 0 0  Tired, decreased energy 0 0  Change in appetite 0 0  Feeling bad or failure about yourself  0 0  Trouble concentrating 0 0  Moving slowly or fidgety/restless 0 0  Suicidal thoughts 0 0  PHQ-9 Score 0 0  Difficult doing work/chores - Not difficult at all    Quality of Life:     Quality of Life - 01/10/16 1415      Quality of Life Scores   Health/Function Pre 28.87 %   Socioeconomic Pre 30 %   Psych/Spiritual Pre 30 %   Family Pre 30 %   GLOBAL Pre 29.5 %      Personal Goals: Goals established at  orientation with interventions provided to work toward goal.     Personal Goals and Risk Factors at Admission - 01/10/16 1247      Core Components/Risk Factors/Patient Goals on Admission   Hypertension Yes   Intervention Provide education on lifestyle modifcations including regular physical activity/exercise, weight management, moderate sodium restriction and increased consumption of fresh fruit, vegetables, and low fat dairy, alcohol moderation, and smoking cessation.;Monitor prescription use compliance.   Expected Outcomes Short Term: Continued assessment and intervention until BP is < 140/60mm HG in hypertensive participants. < 130/32mm HG in hypertensive participants with diabetes, heart failure or chronic kidney disease.;Long Term: Maintenance of blood pressure at goal levels.       Personal Goals Discharge:     Goals and Risk Factor Review    Row Name 02/01/16 1645 02/22/16 1644 02/29/16 1807 02/29/16 1809 02/29/16 1819     Core Components/Risk Factors/Patient Goals Review   Personal Goals Review Hypertension;Lipids;Sedentary Sedentary;Increase Strength and Stamina Hypertension;Lipids  - Lipids;Hypertension   Review Mr Bosserman states that he measures BP at home and it has been around 137/70's, last cholesterol check was in April and was 150 total.  He has retuned to work and using a weed eater at home and has done fine with lifting at work.  He states he is pre-diabetic and measures BG at home.  He will follow up with his Dr at next 6 month check up.  Home exercise program discussed with Mr Bulson with verbalization of understand. - - Lipids:  [name] has not had lipid panel since April.  His insurance would not continue paying for Vytorin 10/40.  [name] is now taking Simvastatin 40 mg once a day.  [name] reports that he feels better just being on Simvastatin.  [name] will have his cholesterol checked again in October  2017 when he sees his primary care physician.  BP upon check in on 02/28/16 was  140/60.  [name] was coming in from work.  Post exercise BP 118/70.  [name] reports checking his BP at home and states it usually ranges from 120s to 130s over 60s to 70s.     Expected Outcomes  -  -  -  - Continue Cardiac Rehab to achieve goals of controlling hypertension and cholesterol.     [place] Name 03/14/16 1017 04/10/16 1817           Core Components/Risk Factors/Patient Goals Review   Personal Goals Review Hypertension;Lipids  -      Review Mr Greener's BP measuremnts have all been within normal limits for exrecise and at rest. He checks it 3 times per week at home.  He has not had cholesterol measured recently but continues to use meds as prescribed. Johndaniel "Red" said Cardiac Rehab has helped him. His blood pressure has been stable. He cont to check his blood sugars at times at home.       Expected Outcomes Mr Zahnow will continue to keep BP and lipids within ranges recommended to reduce risk of furture heart disease. Cont heart heathly lifestyle.          Nutrition & Weight - Outcomes:      Post Biometrics - 04/05/16 1204       Post  Biometrics   Height 5\' 11"  (1.803 m)   Weight 170 lb (77.1 kg)   BMI (Calculated) 23.8      Nutrition:     Nutrition Therapy & Goals - 02/01/16 1618      Personal Nutrition Goals   Personal Goal #1 Cont to eat healthy. Prefers to not meet individually with the Cardiac Rehab Registered Dietician.      Nutrition Discharge:     Nutrition Assessments - 04/10/16 1817      Rate Your Plate Scores   Post Score 64   Post Score % 73 %      Education Questionnaire Score:     Knowledge Questionnaire Score - 04/10/16 1820      Knowledge Questionnaire Score   Post Score 24      Goals reviewed with patient; copy given to patient.

## 2016-04-10 NOTE — Progress Notes (Signed)
Discharge Summary  Patient Details  Name: Kevin Stewart MRN: [id number] Date of Birth: [birth year] Referring Provider:   Flowsheet Row Cardiac Rehab from 01/10/2016 in [facility] Cardiac and Pulmonary Rehab  Referring Provider  Kevin Stewart       Number of Visits: 110  Reason for Discharge:  Patient reached a stable level of exercise. Patient independent in their exercise.  Smoking History:  History  Smoking Status  . Former Smoker  . Packs/day: 2.00  . Years: 10.00  . Types: Cigarettes  . Quit date: 07/10/1973  Smokeless Tobacco  . Former Systems developer  . Types: Chew    Comment: "chewed a little; stopped in 06/1973"    Diagnosis:  Status post coronary artery stent placement  ADL UCSD:   Initial Exercise Prescription:     Initial Exercise Prescription - 01/10/16 1300      Date of Initial Exercise RX and Referring Provider   Date 01/10/16   Referring Provider Kevin Stewart     Treadmill   MPH 3   Grade 0.5   Minutes 15   METs 3.5     Bike   Level 1.5   Minutes 15     Recumbant Bike   Level 2   RPM 50   Watts 40   Minutes 15   METs 3.5     NuStep   Level 3   Watts 60   Minutes 15   METs 3.5     Elliptical   Level 1   Speed 50   Minutes 5     REL-XR   Level 2   Watts 60   Minutes 15   METs 3.5     T5 Nustep   Level 2   Watts 60   Minutes 15   METs 3.5     Biostep-RELP   Level 3   Watts 60   Minutes 15   METs 3.5     Prescription Details   Frequency (times per week) 3   Duration Progress to 45 minutes of aerobic exercise without signs/symptoms of physical distress     Intensity   THRR 40-80% of Max Heartrate 99-131   Ratings of Perceived Exertion 11-13   Perceived Dyspnea 0-4     Progression   Progression Continue to progress workloads to maintain intensity without signs/symptoms of physical distress.     Resistance Training   Training Prescription Yes   Weight 6   Reps 10-15      Discharge Exercise Prescription (Final Exercise  Prescription Changes):     Exercise Prescription Changes - 03/28/16 1000      Exercise Review   Progression Yes     Response to Exercise   Blood Pressure (Admit) 128/70   Blood Pressure (Exercise) 164/66   Blood Pressure (Exit) 128/66   Heart Rate (Admit) 87 bpm   Heart Rate (Exercise) 112 bpm   Heart Rate (Exit) 71 bpm   Rating of Perceived Exertion (Exercise) 12   Symptoms none     Progression   Progression Continue to progress workloads to maintain intensity without signs/symptoms of physical distress.   Average METs 6     Resistance Training   Training Prescription Yes   Weight 10   Reps 10-15     Treadmill   MPH 3.8   Grade 4.5   Minutes 15   METs 6.27     REL-XR   Level 10   Minutes 15   METs 6      Functional Capacity:  [place] Name 01/10/16 1354 04/05/16 1202       6 Minute Walk   Phase  - Discharge    Distance 1600 feet 1910 feet    Distance % Change  - 16 %    Walk Time 6 minutes 6 minutes    # of Rest Breaks  - 0    MPH 3 3.6    METS 3.85 4.49    RPE 13 11    VO2 Peak 13.5 15.7    Symptoms No No    Resting HR 67 bpm 89 bpm    Resting BP 130/64 132/70    Max Ex. HR 102 bpm 113 bpm    Max Ex. BP 172/76 162/72       Psychological, QOL, Others - Outcomes: PHQ 2/9: Depression screen [facility] 2/9 04/10/2016 01/10/2016  Decreased Interest 0 0  Down, Depressed, Hopeless 0 0  PHQ - 2 Score 0 0  Altered sleeping 0 0  Tired, decreased energy 0 0  Change in appetite 0 0  Feeling bad or failure about yourself  0 0  Trouble concentrating 0 0  Moving slowly or fidgety/restless 0 0  Suicidal thoughts 0 0  PHQ-9 Score 0 0  Difficult doing work/chores - Not difficult at all    Quality of Life:     Quality of Life - 01/10/16 1415      Quality of Life Scores   Health/Function Pre 28.87 %   Socioeconomic Pre 30 %   Psych/Spiritual Pre 30 %   Family Pre 30 %   GLOBAL Pre 29.5 %      Personal Goals: Goals established at  orientation with interventions provided to work toward goal.     Personal Goals and Risk Factors at Admission - 01/10/16 1247      Core Components/Risk Factors/Patient Goals on Admission   Hypertension Yes   Intervention Provide education on lifestyle modifcations including regular physical activity/exercise, weight management, moderate sodium restriction and increased consumption of fresh fruit, vegetables, and low fat dairy, alcohol moderation, and smoking cessation.;Monitor prescription use compliance.   Expected Outcomes Short Term: Continued assessment and intervention until BP is < 140/52mm HG in hypertensive participants. < 130/56mm HG in hypertensive participants with diabetes, heart failure or chronic kidney disease.;Long Term: Maintenance of blood pressure at goal levels.       Personal Goals Discharge:     Goals and Risk Factor Review    Row Name 02/01/16 1645 02/22/16 1644 02/29/16 1807 02/29/16 1809 02/29/16 1819     Core Components/Risk Factors/Patient Goals Review   Personal Goals Review Hypertension;Lipids;Sedentary Sedentary;Increase Strength and Stamina Hypertension;Lipids  - Lipids;Hypertension   Review Kevin Stewart states that Kevin Stewart measures BP at home and it has been around 137/70's, last cholesterol check was in April and was 150 total.  Kevin Stewart has retuned to work and using a weed eater at home and has done fine with lifting at work.  Kevin Stewart states Kevin Stewart is pre-diabetic and measures BG at home.  Kevin Stewart will follow up with his Dr at next 6 month check up.  Home exercise program discussed with Kevin Stewart with verbalization of understand. - - Lipids:  Kevin Stewart has not had lipid panel since April.  His insurance would not continue paying for Vytorin 10/40.  Kevin Stewart is now taking Simvastatin 40 mg once a day.  Kevin Stewart reports that Kevin Stewart feels better just being on Simvastatin.  Kevin Stewart will have his cholesterol checked again in October  2017 when Kevin Stewart sees his primary care physician.  BP upon check in on 02/28/16 was  140/60.  Kevin Stewart was coming in from work.  Post exercise BP 118/70.  Kevin Stewart reports checking his BP at home and states it usually ranges from 120s to 130s over 60s to 70s.     Expected Outcomes  -  -  -  - Continue Cardiac Rehab to achieve goals of controlling hypertension and cholesterol.     Kevin Stewart Name 03/14/16 1017 04/10/16 1817           Core Components/Risk Factors/Patient Goals Review   Personal Goals Review Hypertension;Lipids  -      Review Kevin Cohenour's BP measuremnts have all been within normal limits for exrecise and at rest. Kevin Stewart checks it 3 times per week at home.  Kevin Stewart has not had cholesterol measured recently but continues to use meds as prescribed. Dacari "Red" said Cardiac Rehab has helped him. His blood pressure has been stable. Kevin Stewart cont to check his blood sugars at times at home.       Expected Outcomes Kevin Gorzynski will continue to keep BP and lipids within ranges recommended to reduce risk of furture heart disease. Cont heart heathly lifestyle.          Nutrition & Weight - Outcomes:      Post Biometrics - 04/05/16 1204       Post  Biometrics   Height 5\' 11"  (1.803 m)   Weight 170 lb (77.1 kg)   BMI (Calculated) 23.8      Nutrition:     Nutrition Therapy & Goals - 02/01/16 1618      Personal Nutrition Goals   Personal Goal #1 Cont to eat healthy. Prefers to not meet individually with the Cardiac Rehab Registered Dietician.      Nutrition Discharge:     Nutrition Assessments - 04/10/16 1817      Rate Your Plate Scores   Post Score 64   Post Score % 73 %      Education Questionnaire Score:     Knowledge Questionnaire Score - 04/10/16 1820      Knowledge Questionnaire Score   Post Score 24      Goals reviewed with patient; copy given to patient.

## 2016-04-10 NOTE — Progress Notes (Signed)
Daily Session Note  Patient Details  Name: Kevin Stewart MRN: [id number] Date of Birth: 1940-08-08 Referring Provider:   Flowsheet Row Cardiac Rehab from 01/10/2016 in [facility] Cardiac and Pulmonary Rehab  Referring Provider  [name]      Encounter Date: 04/10/2016  Check In:     Session Check In - 04/10/16 1629      Check-In   Location [facility]-Cardiac & Pulmonary Rehab   Staff Present Gerlene Burdock, RN, Vickki Hearing, BA, ACSM CEP, Exercise Physiologist   Supervising physician immediately available to respond to emergencies See telemetry face sheet for immediately available ER MD   Medication changes reported     No   Fall or balance concerns reported    No   Warm-up and Cool-down Performed on first and last piece of equipment   Resistance Training Performed No   VAD Patient? No     Pain Assessment   Currently in Pain? No/denies         Goals Met:  Independence with exercise equipment Exercise tolerated well No report of cardiac concerns or symptoms Strength training completed today  Goals Unmet:  Not Applicable  Comments:  Kevin Stewart graduated today from cardiac rehab with 36 sessions completed.  Details of the patient's exercise prescription and what he needs to do in order to continue the prescription and progress were discussed with patient.  Patient was given a copy of prescription and goals.  Patient verbalized understanding.  Kevin Stewart plans to continue to exercise by walking at home.   Dr. Emily Filbert is Medical Director for [place] and [facility].

## 2016-04-10 NOTE — Progress Notes (Signed)
Daily Session Note  Patient Details  Name: DORA CLAUSS MRN: [id number] Date of Birth: 1941/05/10 Referring Provider:   Flowsheet Row Cardiac Rehab from 01/10/2016 in [facility] Cardiac and Pulmonary Rehab  Referring Provider  [name]      Encounter Date: 04/10/2016  Check In:     Session Check In - 04/10/16 1629      Check-In   Location [facility]-Cardiac & Pulmonary Rehab   Staff Present Gerlene Burdock, RN, Vickki Hearing, BA, ACSM CEP, Exercise Physiologist   Supervising physician immediately available to respond to emergencies See telemetry face sheet for immediately available ER MD   Medication changes reported     No   Fall or balance concerns reported    No   Warm-up and Cool-down Performed on first and last piece of equipment   Resistance Training Performed No   VAD Patient? No     Pain Assessment   Currently in Pain? No/denies         Goals Met:  Independence with exercise equipment Exercise tolerated well No report of cardiac concerns or symptoms Strength training completed today  Goals Unmet:  Not Applicable  Comments: Pt able to follow exercise prescription today without complaint.  Will continue to monitor for progression.   Dr. Emily Filbert is Medical Director for [place] and [facility].

## 2016-04-11 NOTE — Progress Notes (Signed)
Discharge Summary  Patient Details  Name: Kevin Stewart MRN: [id number] Date of Birth: 01/30/1941 Referring Provider:   Flowsheet Row Cardiac Rehab from 01/10/2016 in [facility]  Referring Provider  Kevin Stewart       Number of Visits: 36/36 sessions.   Reason for Discharge:  Patient reached a stable level of exercise. Patient independent in their exercise.  Smoking History:  History  Smoking Status  . Former Smoker  . Packs/day: 2.00  . Years: 10.00  . Types: Cigarettes  . Quit date: 07/10/1973  Smokeless Tobacco  . Former User  . Types: Chew    Comment: "chewed a little; stopped in 06/1973"    Diagnosis:  Status post coronary artery stent placement  ADL UCSD:   Initial Exercise Prescription:     Initial Exercise Prescription - 01/10/16 1300      Date of Initial Exercise RX and Referring Provider   Date 01/10/16   Referring Provider Kevin Stewart     Treadmill   MPH 3   Grade 0.5   Minutes 15   METs 3.5     Bike   Level 1.5   Minutes 15     Recumbant Bike   Level 2   RPM 50   Watts 40   Minutes 15   METs 3.5     NuStep   Level 3   Watts 60   Minutes 15   METs 3.5     Elliptical   Level 1   Speed 50   Minutes 5     REL-XR   Level 2   Watts 60   Minutes 15   METs 3.5     T5 Nustep   Level 2   Watts 60   Minutes 15   METs 3.5     Biostep-RELP   Level 3   Watts 60   Minutes 15   METs 3.5     Prescription Details   Frequency (times per week) 3   Duration Progress to 45 minutes of aerobic exercise without signs/symptoms of physical distress     Intensity   THRR 40-80% of Max Heartrate 99-131   Ratings of Perceived Exertion 11-13   Perceived Dyspnea 0-4     Progression   Progression Continue to progress workloads to maintain intensity without signs/symptoms of physical distress.     Resistance Training   Training Prescription Yes   Weight 6   Reps 10-15      Discharge Exercise Prescription (Final  Exercise Prescription Changes):     Exercise Prescription Changes - 03/28/16 1000      Exercise Review   Progression Yes     Response to Exercise   Blood Pressure (Admit) 128/70   Blood Pressure (Exercise) 164/66   Blood Pressure (Exit) 128/66   Heart Rate (Admit) 87 bpm   Heart Rate (Exercise) 112 bpm   Heart Rate (Exit) 71 bpm   Rating of Perceived Exertion (Exercise) 12   Symptoms none     Progression   Progression Continue to progress workloads to maintain intensity without signs/symptoms of physical distress.   Average METs 6     Resistance Training   Training Prescription Yes   Weight 10   Reps 10-15     Treadmill   MPH 3.8   Grade 4.5   Minutes 15   METs 6.27     REL-XR   Level 10   Minutes 15   METs 6        Functional Capacity:     6 Minute Walk    Row Name 01/10/16 1354 04/05/16 1202       6 Minute Walk   Phase  - Discharge    Distance 1600 feet 1910 feet    Distance % Change  - 16 %    Walk Time 6 minutes 6 minutes    # of Rest Breaks  - 0    MPH 3 3.6    METS 3.85 4.49    RPE 13 11    VO2 Peak 13.5 15.7    Symptoms No No    Resting HR 67 bpm 89 bpm    Resting BP 130/64 132/70    Max Ex. HR 102 bpm 113 bpm    Max Ex. BP 172/76 162/72       Psychological, QOL, Others - Outcomes: PHQ 2/9: Depression screen PHQ 2/9 04/10/2016 01/10/2016  Decreased Interest 0 0  Down, Depressed, Hopeless 0 0  PHQ - 2 Score 0 0  Altered sleeping 0 0  Tired, decreased energy 0 0  Change in appetite 0 0  Feeling bad or failure about yourself  0 0  Trouble concentrating 0 0  Moving slowly or fidgety/restless 0 0  Suicidal thoughts 0 0  PHQ-9 Score 0 0  Difficult doing work/chores - Not difficult at all    Quality of Life:     Quality of Life - 04/11/16 1520      Quality of Life Scores   Health/Function Pre 30 %   Socioeconomic Post 30 %   Psych/Spiritual Post 30 %   Family Post 30 %   GLOBAL Post 29.5 %      Personal Goals: Goals  established at orientation with interventions provided to work toward goal.     Personal Goals and Risk Factors at Admission - 01/10/16 1247      Core Components/Risk Factors/Patient Goals on Admission   Hypertension Yes   Intervention Provide education on lifestyle modifcations including regular physical activity/exercise, weight management, moderate sodium restriction and increased consumption of fresh fruit, vegetables, and low fat dairy, alcohol moderation, and smoking cessation.;Monitor prescription use compliance.   Expected Outcomes Short Term: Continued assessment and intervention until BP is < 140/90mm HG in hypertensive participants. < 130/80mm HG in hypertensive participants with diabetes, heart failure or chronic kidney disease.;Long Term: Maintenance of blood pressure at goal levels.       Personal Goals Discharge:     Goals and Risk Factor Review    Row Name 02/01/16 1645 02/22/16 1644 02/29/16 1807 02/29/16 1809 02/29/16 1819     Core Components/Risk Factors/Patient Goals Review   Personal Goals Review Hypertension;Lipids;Sedentary Sedentary;Increase Strength and Stamina Hypertension;Lipids  - Lipids;Hypertension   Review Kevin Stewart states that he measures BP at home and it has been around 137/70's, last cholesterol check was in April and was 150 total.  He has retuned to work and using a weed eater at home and has done fine with lifting at work.  He states he is pre-diabetic and measures BG at home.  He will follow up with his Dr at next 6 month check up.  Home exercise program discussed with Kevin Stewart with verbalization of understand. - - Lipids:  Kevin Stewart has not had lipid panel since April.  His insurance would not continue paying for Vytorin 10/40.  Kevin Stewart is now taking Simvastatin 40 mg once a day.  Kevin Stewart reports that he feels better just being on Simvastatin.  Kevin Stewart will have   cholesterol checked again in October 2017 when he sees his primary care physician.  BP upon check in on  02/28/16 was 140/60.  Kevin Stewart was coming in from work.  Post exercise BP 118/70.  Kevin Stewart reports checking his BP at home and states it usually ranges from 120s to 130s over 60s to 70s.     Expected Outcomes  -  -  -  - Continue Cardiac Rehab to achieve goals of controlling hypertension and cholesterol.     [place] Name 03/14/16 1017 04/10/16 1817           Core Components/Risk Factors/Patient Goals Review   Personal Goals Review Hypertension;Lipids  -      Review Kevin Truman's BP measuremnts have all been within normal limits for exrecise and at rest. He checks it 3 times per week at home.  He has not had cholesterol measured recently but continues to use meds as prescribed. [place] "Red" said Cardiac Rehab has helped him. His blood pressure has been stable. He cont to check his blood sugars at times at home.       Expected Outcomes Kevin Runser will continue to keep BP and lipids within ranges recommended to reduce risk of furture heart disease. Cont heart heathly lifestyle.          Nutrition & Weight - Outcomes:      Post Biometrics - 04/05/16 1204       Post  Biometrics   Height 5\' 11"  (1.803 m)   Weight 170 lb (77.1 kg)   BMI (Calculated) 23.8      Nutrition:     Nutrition Therapy & Goals - 02/01/16 1618      Personal Nutrition Goals   Personal Goal #1 Cont to eat healthy. Prefers to not meet individually with the Cardiac Rehab Registered Dietician.      Nutrition Discharge:     Nutrition Assessments - 04/10/16 1817      Rate Your Plate Scores   Post Score 64   Post Score % 73 %      Education Questionnaire Score:     Knowledge Questionnaire Score - 04/10/16 1820      Knowledge Questionnaire Score   Post Score 24      Goals reviewed with patient; copy given to patient.

## 2016-04-11 NOTE — Progress Notes (Signed)
Cardiac Individual Treatment Plan  Patient Details  Name: Kevin Stewart MRN: [id number] Date of Birth: 1940/08/27 Referring Provider:   Flowsheet Row Cardiac Rehab from 01/10/2016 in [facility] Cardiac and Pulmonary Rehab  Referring Provider  [name]      Initial Encounter Date:  Flowsheet Row Cardiac Rehab from 01/10/2016 in [facility] Cardiac and Pulmonary Rehab  Date  01/10/16  Referring Provider  [name]      Visit Diagnosis: Status post coronary artery stent placement  Patient's Home Medications on Admission:  Current Outpatient Prescriptions:  .  aspirin 81 MG tablet, Take 81 mg by mouth daily., Disp: , Rfl:  .  carvedilol (COREG) 12.5 MG tablet, Take 12.5 mg by mouth 2 (two) times daily with a meal., Disp: , Rfl:  .  clopidogrel (PLAVIX) 75 MG tablet, Take 75 mg by mouth daily., Disp: , Rfl:  .  enalapril-hydrochlorothiazide (VASERETIC) 10-25 MG per tablet, Take 1 tablet by mouth daily. , Disp: , Rfl:  .  ezetimibe-simvastatin (VYTORIN) 10-40 MG per tablet, Take 1 tablet by mouth at bedtime. Reported on 02/08/2016, Disp: , Rfl:  .  fluticasone (FLONASE) 50 MCG/ACT nasal spray, Place 1 spray into both nostrils as needed., Disp: , Rfl: 0 .  naphazoline-pheniramine (EYE ALLERGY RELIEF) 0.025-0.3 % ophthalmic solution, Place 2 drops into both eyes 2 (two) times daily., Disp: , Rfl:  .  nitroGLYCERIN (NITROSTAT) 0.4 MG SL tablet, Place 1 tablet (0.4 mg total) under the tongue every 5 (five) minutes as needed for chest pain (up to 3 doses)., Disp: 25 tablet, Rfl: 3 .  Omega-3 Fatty Acids (OMEGA-3 IQ PO), Take 1 tablet by mouth daily., Disp: , Rfl:  .  simvastatin (ZOCOR) 40 MG tablet, , Disp: , Rfl: 3  Past Medical History: Past Medical History:  Diagnosis Date  . Arthritis    "hands" (12/20/2015)  . Basal cell carcinoma, face   . Coronary artery disease    a. 2003 - light MI per patient; s/p Cypher DES to prox LAD in [facility].  Marland Kitchen Hx of adenomatous colonic polyps   . Hyperlipemia   .  Hyperlipidemia   . Hypertension   . Kidney stones    "passed them"  . Melanoma of right side of neck ([place])    removed posterior neck [id number]  . Migraine    "stopped in 2001 when I had my heart attack"  . Myocardial infarction ([place])   . Pre-diabetes   . Wears dentures    upper-lower partial  . Wears glasses     Tobacco Use: History  Smoking Status  . Former Smoker  . Packs/day: 2.00  . Years: 10.00  . Types: Cigarettes  . Quit date: 74/20/1974  Smokeless Tobacco  . Former Systems developer  . Types: Chew    Comment: "chewed a little; stopped in 06/1973"    Labs: Recent Review Flowsheet Data    Labs for ITP Cardiac and Pulmonary Rehab Latest Ref Rng & Units 07/21/2012   TCO2 0 - 100 mmol/L 27       Exercise Target Goals:    Exercise Program Goal: Individual exercise prescription set with THRR, safety & activity barriers. Participant demonstrates ability to understand and report RPE using BORG scale, to self-measure pulse accurately, and to acknowledge the importance of the exercise prescription.  Exercise Prescription Goal: Starting with aerobic activity 30 plus minutes a day, 3 days per week for initial exercise prescription. Provide home exercise prescription and guidelines that participant acknowledges understanding prior to discharge.  Activity Barriers & Risk Stratification:     Activity Barriers & Cardiac Risk Stratification - 01/10/16 1240      Activity Barriers & Cardiac Risk Stratification   Activity Barriers Chest Pain/Angina;Shortness of Breath  Angina symptoms continue, MD is aware of symptoms.  Has used NTG to relieve symptoms.    Cardiac Risk Stratification High      6 Minute Walk:     6 Minute Walk    Row Name 01/10/16 1354 04/05/16 1202       6 Minute Walk   Phase  - Discharge    Distance 1600 feet 1910 feet    Distance % Change  - 16 %    Walk Time 6 minutes 6 minutes    # of Rest Breaks  - 0    MPH 3 3.6    METS 3.85 4.49    RPE 13 11     VO2 Peak 13.5 15.7    Symptoms No No    Resting HR 67 bpm 89 bpm    Resting BP 130/64 132/70    Max Ex. HR 102 bpm 113 bpm    Max Ex. BP 172/76 162/72       Initial Exercise Prescription:     Initial Exercise Prescription - 01/10/16 1300      Date of Initial Exercise RX and Referring Provider   Date 01/10/16   Referring Provider [name]     Treadmill   MPH 3   Grade 0.5   Minutes 15   METs 3.5     Bike   Level 1.5   Minutes 15     Recumbant Bike   Level 2   RPM 50   Watts 40   Minutes 15   METs 3.5     NuStep   Level 3   Watts 60   Minutes 15   METs 3.5     Elliptical   Level 1   Speed 50   Minutes 5     REL-XR   Level 2   Watts 60   Minutes 15   METs 3.5     T5 Nustep   Level 2   Watts 60   Minutes 15   METs 3.5     Biostep-RELP   Level 3   Watts 60   Minutes 15   METs 3.5     Prescription Details   Frequency (times per week) 3   Duration Progress to 45 minutes of aerobic exercise without signs/symptoms of physical distress     Intensity   THRR 40-80% of Max Heartrate 99-131   Ratings of Perceived Exertion 11-13   Perceived Dyspnea 0-4     Progression   Progression Continue to progress workloads to maintain intensity without signs/symptoms of physical distress.     Resistance Training   Training Prescription Yes   Weight 6   Reps 10-15      Perform Capillary Blood Glucose checks as needed.  Exercise Prescription Changes:     Exercise Prescription Changes    Row Name 01/10/16 1300 01/19/16 1000 01/31/16 1300 02/15/16 1400 02/29/16 1400     Exercise Review   Progression  -  - Yes Yes Yes     Response to Exercise   Blood Pressure (Admit) 130/64 142/70 142/82 140/80 140/60   Blood Pressure (Exercise) 172/76 158/62 172/78 142/80 184/66   Blood Pressure (Exit) 162/82 124/68 146/72 128/70 118/70   Heart Rate (Admit) 65 bpm 62 bpm 63 bpm 80  bpm 69 bpm   Heart Rate (Exercise) 102 bpm 94 bpm 111 bpm 120 bpm 106 bpm   Heart Rate  (Exit) 65 bpm 77 bpm 76 bpm 72 bpm 80 bpm   Oxygen Saturation (Admit) 102 %  -  -  -  -   Oxygen Saturation (Exercise) 83 %  -  -  -  -   Rating of Perceived Exertion (Exercise)  -  - '12 12 12   '$ Perceived Dyspnea (Exercise) 13  -  -  -  -   Symptoms  -  - none none none     Progression   Progression  -  -  - Continue to progress workloads to maintain intensity without signs/symptoms of physical distress. Continue to progress workloads to maintain intensity without signs/symptoms of physical distress.   Average METs  -  -  - 6 5.5     Resistance Training   Training Prescription  - Yes Yes Yes Yes   Weight  - '10 10 10 10   '$ Reps  - 10-15 10-15 10-15 10-15     Interval Training   Interval Training  - No  -  - Yes     Treadmill   MPH  - 3.1 3 3.8 3.8   Grade  - 1 4.5 4.5 4.5   Minutes  - '22 10 15 15   '$ METs  - 3.8 5.1 6 6.27     NuStep   Level  - '5 7 6 6   '$ Watts  - 74  -  -  -   Minutes  - '15 25 15 15   '$ METs  - 3.6 5.3  -  -     REL-XR   Level  -  -  -  - 8     Home Exercise Plan   Plans to continue exercise at  -  -  -  - Home   Frequency  -  -  -  - Add 2 additional days to program exercise sessions.   [place] Name 03/14/16 1200 03/28/16 1000           Exercise Review   Progression Yes Yes        Response to Exercise   Blood Pressure (Admit) 136/64 128/70      Blood Pressure (Exercise) 186/64 164/66      Blood Pressure (Exit) 132/66 128/66      Heart Rate (Admit) 90 bpm 87 bpm      Heart Rate (Exercise) 133 bpm 112 bpm      Heart Rate (Exit) 68 bpm 71 bpm      Rating of Perceived Exertion (Exercise) 13 12      Symptoms none none        Progression   Progression Continue to progress workloads to maintain intensity without signs/symptoms of physical distress. Continue to progress workloads to maintain intensity without signs/symptoms of physical distress.      Average METs 6.2 6        Resistance Training   Training Prescription Yes Yes      Weight 10 10      Reps  10-15 10-15        Interval Training   Interval Training Yes  -      Equipment NuStep  -        Treadmill   MPH 3.8 3.8      Grade 4.5 4.5      Minutes 15 15  METs 6.27 6.27        NuStep   Level 6  -      Minutes 15  -      METs 6.2  -        REL-XR   Level 10 10      Minutes 15 15      METs 6.5 6         Exercise Comments:     Exercise Comments    Row Name 01/19/16 1016 01/31/16 1352 02/15/16 1404 02/29/16 1418 03/14/16 1259   Exercise Comments Raahil is progressing well with exercise and has completed all sessions with no symtpoms. Mr Tasker has progressed very well with exercise and maintains his HR within recommended range. [name] is progressing well with exercise. Red is progressing well and will try to add another day of walking at home since he is attending only twice per week. Mr Fulgham is progressing well with exercise.   [place] Name 03/28/16 1054           Exercise Comments [name] continues to do well with exercise.          Discharge Exercise Prescription (Final Exercise Prescription Changes):     Exercise Prescription Changes - 03/28/16 1000      Exercise Review   Progression Yes     Response to Exercise   Blood Pressure (Admit) 128/70   Blood Pressure (Exercise) 164/66   Blood Pressure (Exit) 128/66   Heart Rate (Admit) 87 bpm   Heart Rate (Exercise) 112 bpm   Heart Rate (Exit) 71 bpm   Rating of Perceived Exertion (Exercise) 12   Symptoms none     Progression   Progression Continue to progress workloads to maintain intensity without signs/symptoms of physical distress.   Average METs 6     Resistance Training   Training Prescription Yes   Weight 10   Reps 10-15     Treadmill   MPH 3.8   Grade 4.5   Minutes 15   METs 6.27     REL-XR   Level 10   Minutes 15   METs 6      Nutrition:  Target Goals: Understanding of nutrition guidelines, daily intake of sodium '1500mg'$ , cholesterol '200mg'$ , calories 30% from fat and 7% or less  from saturated fats, daily to have 5 or more servings of fruits and vegetables.  Biometrics:      Post Biometrics - 04/05/16 1204       Post  Biometrics   Height '5\' 11"'$  (1.803 m)   Weight 170 lb (77.1 kg)   BMI (Calculated) 23.8      Nutrition Therapy Plan and Nutrition Goals:     Nutrition Therapy & Goals - 02/01/16 1618      Personal Nutrition Goals   Personal Goal #1 Cont to eat healthy. Prefers to not meet individually with the Cardiac Rehab Registered Dietician.      Nutrition Discharge: Rate Your Plate Scores:     Nutrition Assessments - 04/10/16 1817      Rate Your Plate Scores   Post Score 64   Post Score % 73 %      Nutrition Goals Re-Evaluation:   Psychosocial: Target Goals: Acknowledge presence or absence of depression, maximize coping skills, provide positive support system. Participant is able to verbalize types and ability to use techniques and skills needed for reducing stress and depression.  Initial Review & Psychosocial Screening:     Initial Psych Review & Screening -  01/10/16 [place]? Yes  Wife     Barriers   Psychosocial barriers to participate in program There are no identifiable barriers or psychosocial needs.;The patient should benefit from training in stress management and relaxation.     Screening Interventions   Interventions Encouraged to exercise      Quality of Life Scores:     Quality of Life - 04/11/16 1520      Quality of Life Scores   Health/Function Pre 30 %   Socioeconomic Post 30 %   Psych/Spiritual Post 30 %   Family Post 30 %   GLOBAL Post 29.5 %      PHQ-9: Recent Review Flowsheet Data    Depression screen [facility] 2/9 04/10/2016 01/10/2016   Decreased Interest 0 0   Down, Depressed, Hopeless 0 0   PHQ - 2 Score 0 0   Altered sleeping 0 0   Tired, decreased energy 0 0   Change in appetite 0 0   Feeling bad or failure about yourself  0 0   Trouble concentrating 0  0   Moving slowly or fidgety/restless 0 0   Suicidal thoughts 0 0   PHQ-9 Score 0 0   Difficult doing work/chores - Not difficult at all      Psychosocial Evaluation and Intervention:     Psychosocial Evaluation - 02/12/16 1711      Psychosocial Evaluation & Interventions   Interventions Encouraged to exercise with the program and follow exercise prescription   Comments Counselor met with Mr. Reppond today for initial psychosocial evaluation.   He reports this is his second time in this Cardiac Rehab program.  Mr. Kats is 75 years old and had a stent inserted in early June.  He states his sleep and appetite are good and he denies a history of depression or anxiety or any current symptoms.  Mr. Minner has stress mostly at work, but he states his mood is positive most of the time.  He has goals to finish this program and be overall healthier.  Counselor encouraged consistency in exercising following completion of this program.        Psychosocial Re-Evaluation:     Psychosocial Re-Evaluation    [place] Name 02/28/16 1717 04/10/16 1818           Psychosocial Re-Evaluation   Comments Counselor follow up with Mr. Wanda Plump "Red" today stating he is experiencing a lot of progress since coming into this program.  He reports the tightness in his chest is gone and he has loosened up in that area as he works out now so no more discomfort.  He reports he sleeps well and increased stamina since starting in Cardiac Rehab.  Counselor commended Red for his commitment to exercise and  progress made.   Josemaria said he felt this Cardiac Rehab program helped him "and he enjoyed it".          Vocational Rehabilitation: Provide vocational rehab assistance to qualifying candidates.   Vocational Rehab Evaluation & Intervention:     Vocational Rehab - 01/10/16 1244      Initial Vocational Rehab Evaluation & Intervention   Assessment shows need for Vocational Rehabilitation No       Education: Education Goals: Education classes will be provided on a weekly basis, covering required topics. Participant will state understanding/return demonstration of topics presented.  Learning Barriers/Preferences:     Learning Barriers/Preferences - 01/10/16 1241  Learning Barriers/Preferences   Learning Barriers None   Learning Preferences Individual Instruction      Education Topics: General Nutrition Guidelines/Fats and Fiber: -Group instruction provided by verbal, written material, models and posters to present the general guidelines for heart healthy nutrition. Gives an explanation and review of dietary fats and fiber. Flowsheet Row Cardiac Rehab from 04/10/2016 in [facility] Cardiac and Pulmonary Rehab  Date  02/12/16  Educator  PI  Instruction Review Code  2- meets goals/outcomes      Controlling Sodium/Reading Food Labels: -Group verbal and written material supporting the discussion of sodium use in heart healthy nutrition. Review and explanation with models, verbal and written materials for utilization of the food label. Flowsheet Row Cardiac Rehab from 04/10/2016 in [facility] Cardiac and Pulmonary Rehab  Date  02/19/16  Educator  PI  Instruction Review Code  2- meets goals/outcomes      Exercise Physiology & Risk Factors: - Group verbal and written instruction with models to review the exercise physiology of the cardiovascular system and associated critical values. Details cardiovascular disease risk factors and the goals associated with each risk factor.   Aerobic Exercise & Resistance Training: - Gives group verbal and written discussion on the health impact of inactivity. On the components of aerobic and resistive training programs and the benefits of this training and how to safely progress through these programs. Flowsheet Row Cardiac Rehab from 04/10/2016 in [facility] Cardiac and Pulmonary Rehab  Date  02/28/16  Educator  Nada Maclachlan  Instruction Review Code   2- meets goals/outcomes      Flexibility, Balance, General Exercise Guidelines: - Provides group verbal and written instruction on the benefits of flexibility and balance training programs. Provides general exercise guidelines with specific guidelines to those with heart or lung disease. Demonstration and skill practice provided.   Stress Management: - Provides group verbal and written instruction about the health risks of elevated stress, cause of high stress, and healthy ways to reduce stress. Flowsheet Row Cardiac Rehab from 04/10/2016 in [facility] Cardiac and Pulmonary Rehab  Date  03/06/16  Educator  Kathreen Cornfield, [facility]  Instruction Review Code  2- meets goals/outcomes      Depression: - Provides group verbal and written instruction on the correlation between heart/lung disease and depressed mood, treatment options, and the stigmas associated with seeking treatment. Flowsheet Row Cardiac Rehab from 04/10/2016 in [facility] Cardiac and Pulmonary Rehab  Date  04/10/16  Educator  [facility]  Instruction Review Code  2- meets goals/outcomes      Anatomy & Physiology of the Heart: - Group verbal and written instruction and models provide basic cardiac anatomy and physiology, with the coronary electrical and arterial systems. Review of: AMI, Angina, Valve disease, Heart Failure, Cardiac Arrhythmia, Pacemakers, and the ICD. Flowsheet Row Cardiac Rehab from 04/10/2016 in [facility] Cardiac and Pulmonary Rehab  Date  01/15/16  Educator  DW  Instruction Review Code  2- meets goals/outcomes      Cardiac Procedures: - Group verbal and written instruction and models to describe the testing methods done to diagnose heart disease. Reviews the outcomes of the test results. Describes the treatment choices: Medical Management, Angioplasty, or Coronary Bypass Surgery.   Cardiac Medications: - Group verbal and written instruction to review commonly prescribed medications for heart disease. Reviews the medication, class of  the drug, and side effects. Includes the steps to properly store meds and maintain the prescription regimen. Flowsheet Row Cardiac Rehab from 04/10/2016 in [facility] Cardiac and Pulmonary Rehab  Date  03/27/16  Educator  SB  Instruction Review Code  2- meets goals/outcomes      Go Sex-Intimacy & Heart Disease, Get SMART - Goal Setting: - Group verbal and written instruction through game format to discuss heart disease and the return to sexual intimacy. Provides group verbal and written material to discuss and apply goal setting through the application of the S.M.A.R.T. Method.   Other Matters of the Heart: - Provides group verbal, written materials and models to describe Heart Failure, Angina, Valve Disease, and Diabetes in the realm of heart disease. Includes description of the disease process and treatment options available to the cardiac patient.   Exercise & Equipment Safety: - Individual verbal instruction and demonstration of equipment use and safety with use of the equipment. Flowsheet Row Cardiac Rehab from 04/10/2016 in [facility] Cardiac and Pulmonary Rehab  Date  03/20/16  Educator  C. EnterkinRN  Instruction Review Code  2- meets goals/outcomes      Infection Prevention: - Provides verbal and written material to individual with discussion of infection control including proper hand washing and proper equipment cleaning during exercise session. Flowsheet Row Cardiac Rehab from 04/10/2016 in [facility] Cardiac and Pulmonary Rehab  Date  01/10/16  Educator  SB  Instruction Review Code  2- meets goals/outcomes      Falls Prevention: - Provides verbal and written material to individual with discussion of falls prevention and safety. Flowsheet Row Cardiac Rehab from 04/10/2016 in [facility] Cardiac and Pulmonary Rehab  Date  01/10/16  Educator  SB  Instruction Review Code  2- meets goals/outcomes      Diabetes: - Individual verbal and written instruction to review signs/symptoms of diabetes,  desired ranges of glucose level fasting, after meals and with exercise. Advice that pre and post exercise glucose checks will be done for 3 sessions at entry of program.    Knowledge Questionnaire Score:     Knowledge Questionnaire Score - 04/10/16 1820      Knowledge Questionnaire Score   Post Score 24      Core Components/Risk Factors/Patient Goals at Admission:     Personal Goals and Risk Factors at Admission - 01/10/16 1247      Core Components/Risk Factors/Patient Goals on Admission   Hypertension Yes   Intervention Provide education on lifestyle modifcations including regular physical activity/exercise, weight management, moderate sodium restriction and increased consumption of fresh fruit, vegetables, and low fat dairy, alcohol moderation, and smoking cessation.;Monitor prescription use compliance.   Expected Outcomes Short Term: Continued assessment and intervention until BP is < 140/52m HG in hypertensive participants. < 130/865mHG in hypertensive participants with diabetes, heart failure or chronic kidney disease.;Long Term: Maintenance of blood pressure at goal levels.      Core Components/Risk Factors/Patient Goals Review:      Goals and Risk Factor Review    Row Name 02/01/16 1645 02/22/16 1644 02/29/16 1807 02/29/16 1809 02/29/16 1819     Core Components/Risk Factors/Patient Goals Review   Personal Goals Review Hypertension;Lipids;Sedentary Sedentary;Increase Strength and Stamina Hypertension;Lipids  - Lipids;Hypertension   Review Mr FoMatterstates that he measures BP at home and it has been around 137/70's, last cholesterol check was in April and was 150 total.  He has retuned to work and using a weed eater at home and has done fine with lifting at work.  He states he is pre-diabetic and measures BG at home.  He will follow up with his Dr at next 6 month check up.  Home exercise program discussed  with Mr Torrez with verbalization of understand. - - Lipids:  [name]  has not had lipid panel since April.  His insurance would not continue paying for Vytorin 10/40.  [name] is now taking Simvastatin 40 mg once a day.  [name] reports that he feels better just being on Simvastatin.  [name] will have his cholesterol checked again in October 2017 when he sees his primary care physician.  BP upon check in on 02/28/16 was 140/60.  [name] was coming in from work.  Post exercise BP 118/70.  [name] reports checking his BP at home and states it usually ranges from 120s to 130s over 60s to 70s.     Expected Outcomes  -  -  -  - Continue Cardiac Rehab to achieve goals of controlling hypertension and cholesterol.     [place] Name 03/14/16 1017 04/10/16 1817           Core Components/Risk Factors/Patient Goals Review   Personal Goals Review Hypertension;Lipids  -      Review Mr Briones's BP measuremnts have all been within normal limits for exrecise and at rest. He checks it 3 times per week at home.  He has not had cholesterol measured recently but continues to use meds as prescribed. Nyal "Red" said Cardiac Rehab has helped him. His blood pressure has been stable. He cont to check his blood sugars at times at home.       Expected Outcomes Mr Covelli will continue to keep BP and lipids within ranges recommended to reduce risk of furture heart disease. Cont heart heathly lifestyle.          Core Components/Risk Factors/Patient Goals at Discharge (Final Review):      Goals and Risk Factor Review - 04/10/16 1817      Core Components/Risk Factors/Patient Goals Review   Review Jori Moll "Red" said Cardiac Rehab has helped him. His blood pressure has been stable. He cont to check his blood sugars at times at home.    Expected Outcomes Cont heart heathly lifestyle.       ITP Comments:     ITP Comments    Row Name 01/10/16 1416 02/01/16 1619 02/07/16 0833 03/06/16 0823 03/14/16 1259   ITP Comments Initial medica review completed. ITP created.  Documentation of diagnosis found in [facility]  discharge summary 12/21/2015 Cont to eat healthy. Prefers to not meet individually with the Cardiac Rehab Registered Dietician. 30 day review. Continue with ITP 30 day review. Continue with ITP unless changes noted by Medical Director at signature of review. Mr Thomann is progressing well with exercise.   [place] Name 03/28/16 1054 04/03/16 0658         ITP Comments [name] continues to do well with exercise. 30 day review. Continue with ITP unless changes noted by Medical Director at signature of review.         Comments:

## 2016-04-11 NOTE — Progress Notes (Signed)
Discharge Summary  Patient Details  Name: Kevin Stewart MRN: [id number] Date of Birth: 10-12-1940 Referring Provider:   Flowsheet Row Cardiac Rehab from 01/10/2016 in [facility]  Referring Provider  [name]       Number of Visits: 36/36 sessions.   Reason for Discharge:  Patient reached a stable level of exercise. Patient independent in their exercise.  Smoking History:  History  Smoking Status  . Former Smoker  . Packs/day: 2.00  . Years: 10.00  . Types: Cigarettes  . Quit date: 07/10/1973  Smokeless Tobacco  . Former Systems developer  . Types: Chew    Comment: "chewed a little; stopped in 06/1973"    Diagnosis:  Status post coronary artery stent placement  ADL UCSD:   Initial Exercise Prescription:     Initial Exercise Prescription - 01/10/16 1300      Date of Initial Exercise RX and Referring Provider   Date 01/10/16   Referring Provider [name]     Treadmill   MPH 3   Grade 0.5   Minutes 15   METs 3.5     Bike   Level 1.5   Minutes 15     Recumbant Bike   Level 2   RPM 50   Watts 40   Minutes 15   METs 3.5     NuStep   Level 3   Watts 60   Minutes 15   METs 3.5     Elliptical   Level 1   Speed 50   Minutes 5     REL-XR   Level 2   Watts 60   Minutes 15   METs 3.5     T5 Nustep   Level 2   Watts 60   Minutes 15   METs 3.5     Biostep-RELP   Level 3   Watts 60   Minutes 15   METs 3.5     Prescription Details   Frequency (times per week) 3   Duration Progress to 45 minutes of aerobic exercise without signs/symptoms of physical distress     Intensity   THRR 40-80% of Max Heartrate 99-131   Ratings of Perceived Exertion 11-13   Perceived Dyspnea 0-4     Progression   Progression Continue to progress workloads to maintain intensity without signs/symptoms of physical distress.     Resistance Training   Training Prescription Yes   Weight 6   Reps 10-15      Discharge Exercise Prescription (Final  Exercise Prescription Changes):     Exercise Prescription Changes - 03/28/16 1000      Exercise Review   Progression Yes     Response to Exercise   Blood Pressure (Admit) 128/70   Blood Pressure (Exercise) 164/66   Blood Pressure (Exit) 128/66   Heart Rate (Admit) 87 bpm   Heart Rate (Exercise) 112 bpm   Heart Rate (Exit) 71 bpm   Rating of Perceived Exertion (Exercise) 12   Symptoms none     Progression   Progression Continue to progress workloads to maintain intensity without signs/symptoms of physical distress.   Average METs 6     Resistance Training   Training Prescription Yes   Weight 10   Reps 10-15     Treadmill   MPH 3.8   Grade 4.5   Minutes 15   METs 6.27     REL-XR   Level 10   Minutes 15   METs 6  Functional Capacity:     6 Minute Walk    Row Name 01/10/16 1354 04/05/16 1202       6 Minute Walk   Phase  - Discharge    Distance 1600 feet 1910 feet    Distance % Change  - 16 %    Walk Time 6 minutes 6 minutes    # of Rest Breaks  - 0    MPH 3 3.6    METS 3.85 4.49    RPE 13 11    VO2 Peak 13.5 15.7    Symptoms No No    Resting HR 67 bpm 89 bpm    Resting BP 130/64 132/70    Max Ex. HR 102 bpm 113 bpm    Max Ex. BP 172/76 162/72       Psychological, QOL, Others - Outcomes: PHQ 2/9: Depression screen [facility] 2/9 04/10/2016 01/10/2016  Decreased Interest 0 0  Down, Depressed, Hopeless 0 0  PHQ - 2 Score 0 0  Altered sleeping 0 0  Tired, decreased energy 0 0  Change in appetite 0 0  Feeling bad or failure about yourself  0 0  Trouble concentrating 0 0  Moving slowly or fidgety/restless 0 0  Suicidal thoughts 0 0  PHQ-9 Score 0 0  Difficult doing work/chores - Not difficult at all    Quality of Life:     Quality of Life - 04/11/16 1520      Quality of Life Scores   Health/Function Pre 30 %   Socioeconomic Post 30 %   Psych/Spiritual Post 30 %   Family Post 30 %   GLOBAL Post 29.5 %      Personal Goals: Goals  established at orientation with interventions provided to work toward goal.     Personal Goals and Risk Factors at Admission - 01/10/16 1247      Core Components/Risk Factors/Patient Goals on Admission   Hypertension Yes   Intervention Provide education on lifestyle modifcations including regular physical activity/exercise, weight management, moderate sodium restriction and increased consumption of fresh fruit, vegetables, and low fat dairy, alcohol moderation, and smoking cessation.;Monitor prescription use compliance.   Expected Outcomes Short Term: Continued assessment and intervention until BP is < 140/54mm HG in hypertensive participants. < 130/76mm HG in hypertensive participants with diabetes, heart failure or chronic kidney disease.;Long Term: Maintenance of blood pressure at goal levels.       Personal Goals Discharge:     Goals and Risk Factor Review    Row Name 02/01/16 1645 02/22/16 1644 02/29/16 1807 02/29/16 1809 02/29/16 1819     Core Components/Risk Factors/Patient Goals Review   Personal Goals Review Hypertension;Lipids;Sedentary Sedentary;Increase Strength and Stamina Hypertension;Lipids  - Lipids;Hypertension   Review Mr Stolzman states that he measures BP at home and it has been around 137/70's, last cholesterol check was in April and was 150 total.  He has retuned to work and using a weed eater at home and has done fine with lifting at work.  He states he is pre-diabetic and measures BG at home.  He will follow up with his Dr at next 6 month check up.  Home exercise program discussed with Mr Bean with verbalization of understand. - - Lipids:  [name] has not had lipid panel since April.  His insurance would not continue paying for Vytorin 10/40.  [name] is now taking Simvastatin 40 mg once a day.  [name] reports that he feels better just being on Simvastatin.  [name] will have  his cholesterol checked again in October 2017 when he sees his primary care physician.  BP upon check in on  02/28/16 was 140/60.  [name] was coming in from work.  Post exercise BP 118/70.  [name] reports checking his BP at home and states it usually ranges from 120s to 130s over 60s to 70s.     Expected Outcomes  -  -  -  - Continue Cardiac Rehab to achieve goals of controlling hypertension and cholesterol.     [place] Name 03/14/16 1017 04/10/16 1817           Core Components/Risk Factors/Patient Goals Review   Personal Goals Review Hypertension;Lipids  -      Review Mr Yeung's BP measuremnts have all been within normal limits for exrecise and at rest. He checks it 3 times per week at home.  He has not had cholesterol measured recently but continues to use meds as prescribed. Devario "Red" said Cardiac Rehab has helped him. His blood pressure has been stable. He cont to check his blood sugars at times at home.       Expected Outcomes Mr Maule will continue to keep BP and lipids within ranges recommended to reduce risk of furture heart disease. Cont heart heathly lifestyle.          Nutrition & Weight - Outcomes:      Post Biometrics - 04/05/16 1204       Post  Biometrics   Height 5\' 11"  (1.803 m)   Weight 170 lb (77.1 kg)   BMI (Calculated) 23.8      Nutrition:     Nutrition Therapy & Goals - 02/01/16 1618      Personal Nutrition Goals   Personal Goal #1 Cont to eat healthy. Prefers to not meet individually with the Cardiac Rehab Registered Dietician.      Nutrition Discharge:     Nutrition Assessments - 04/10/16 1817      Rate Your Plate Scores   Post Score 64   Post Score % 73 %      Education Questionnaire Score:     Knowledge Questionnaire Score - 04/10/16 1820      Knowledge Questionnaire Score   Post Score 24      Goals reviewed with patient; copy given to patient.

## 2016-05-07 NOTE — Progress Notes (Signed)
Cardiology Office Note   Date:  05/08/2016   ID:  Kevin Stewart, DOB [birth year], MRN [id number]  PCP:  [name], MD  Cardiologist:   Brennen Gardiner [name], MD   Chief Complaint  Patient presents with  . Follow-up  . Coronary Artery Disease      History of Present Illness: Kevin Stewart is a 75 y.o. male who presents to establish cardiac care. He has a history of CAD. He presented in 2003 with a "light" heart attack and had stenting of the proximal LAD with a 3.5 x 18 mm Cypher stent in [facility]. He has been on chronic ASA and Plavix. He was seen this spring with  some angina with heavy physical activity. He has risk factors of HTN, hypercholesterolemia, and family history of CAD. He had an ETT that was abnormal and underwent cardiac cath on 12/20/15 demonstrating a 99% proximal LAD stenosis proximal to the prior stent. This was treated with DES. Since then he has completed cardiac Rehab. He is exercising vigorously without chest pain. No SOB, palpitations, dizziness. Feels very  Well.   Past Medical History:  Diagnosis Date  . Arthritis    "hands" (12/20/2015)  . Basal cell carcinoma, face   . Coronary artery disease    a. 2003 - light MI per patient; s/p Cypher DES to prox LAD in [facility].  Marland Kitchen Hx of adenomatous colonic polyps   . Hyperlipemia   . Hyperlipidemia   . Hypertension   . Kidney stones    "passed them"  . Melanoma of right side of neck ([place])    removed posterior neck 12/312013  . Migraine    "stopped in 2001 when I had my heart attack"  . Myocardial infarction   . Pre-diabetes   . Wears dentures    upper-lower partial  . Wears glasses     Past Surgical History:  Procedure Laterality Date  . CARDIAC CATHETERIZATION N/A 12/20/2015   Procedure: Left Heart Cath and Coronary Angiography;  Surgeon: Belva Crome, MD;  Location: [facility];  Service: Cardiovascular;  Laterality: N/A;  . CARDIAC CATHETERIZATION N/A 12/20/2015   Procedure:  Coronary Stent Intervention;  Surgeon: Belva Crome, MD;  Location: [facility];  Service: Cardiovascular;  Laterality: N/A;  . CATARACT EXTRACTION W/ INTRAOCULAR LENS  IMPLANT, BILATERAL Bilateral 2011  . COLONOSCOPY    . CORONARY ANGIOPLASTY WITH STENT PLACEMENT  2001   "1 stent"  . CORONARY ANGIOPLASTY WITH STENT PLACEMENT  12/20/2015   "1 stent"  . HEMORRHOID SURGERY     "burned them off"  . MELANOMA EXCISION WITH SENTINEL LYMPH NODE BIOPSY  07/21/2012   Procedure: MELANOMA EXCISION WITH SENTINEL LYMPH NODE BIOPSY;  Surgeon: Rozetta Nunnery, MD;  Location: [facility];  Service: General;  Laterality: N/A;  melanoma excision with sentinel node biopsy   posterior right neck  . TONSILLECTOMY  1961     Current Outpatient Prescriptions  Medication Sig Dispense Refill  . aspirin 81 MG tablet Take 81 mg by mouth daily.    . carvedilol (COREG) 12.5 MG tablet Take 12.5 mg by mouth 2 (two) times daily with a meal.    . clopidogrel (PLAVIX) 75 MG tablet Take 75 mg by mouth daily.    . enalapril-hydrochlorothiazide (VASERETIC) 10-25 MG per tablet Take 1 tablet by mouth daily.     Marland Kitchen ezetimibe-simvastatin (VYTORIN) 10-40 MG per tablet Take 1 tablet by mouth at bedtime. Reported  on 02/08/2016    . fluticasone (FLONASE) 50 MCG/ACT nasal spray Place 1 spray into both nostrils as needed.  0  . naphazoline-pheniramine (EYE ALLERGY RELIEF) 0.025-0.3 % ophthalmic solution Place 2 drops into both eyes 2 (two) times daily.    . nitroGLYCERIN (NITROSTAT) 0.4 MG SL tablet Place 1 tablet (0.4 mg total) under the tongue every 5 (five) minutes as needed for chest pain (up to 3 doses). 25 tablet 3  . Omega-3 Fatty Acids (OMEGA-3 IQ PO) Take 1 tablet by mouth daily.    . simvastatin (ZOCOR) 40 MG tablet   3   No current facility-administered medications for this visit.     Allergies:   Shrimp [shellfish allergy]    Social History:  The patient  reports that he quit smoking about 42  years ago. His smoking use included Cigarettes. He has a 20.00 pack-year smoking history. He has quit using smokeless tobacco. His smokeless tobacco use included Chew. He reports that he drinks alcohol. He reports that he does not use drugs.   Family History:  The patient's family history includes Cancer - Colon (age of onset: 63) in his brother; Cancer - Other (age of onset: 15) in his father; Cancer - Other (age of onset: 59) in his sister; Heart attack (age of onset: 29) in his mother; Heart attack (age of onset: 44) in his brother.    ROS:  Please see the history of present illness.   Otherwise, review of systems are positive for none.   All other systems are reviewed and negative.    PHYSICAL EXAM: VS:  BP (!) 160/80 (BP Location: Right Arm, Patient Position: Sitting, Cuff Size: Normal)   Ht '5\' 11"'  (1.803 m)   Wt 171 lb (77.6 kg)   SpO2 97%   BMI 23.85 kg/m  , BMI Body mass index is 23.85 kg/m. GEN: Well nourished, well developed, in no acute distress  HEENT: normal  Neck: no JVD, carotid bruits, or masses Cardiac: RRR; normal S1-2. no murmurs, rubs, or gallops,no edema  Respiratory:  clear to auscultation bilaterally, normal work of breathing GI: soft, nontender, nondistended, + BS MS: no deformity or atrophy  Skin: warm and dry, no rash Neuro:  Strength and sensation are intact Psych: euthymic mood, full affect   EKG:  EKG is not ordered today.   Recent Labs: 12/21/2015: BUN 11; Creatinine, Ser 0.79; Hemoglobin 13.8; Platelets 209; Potassium 3.9; Sodium 138    Lipid Panel No results found for: CHOL, TRIG, HDL, CHOLHDL, VLDL, LDLCALC, LDLDIRECT  Lipids dated 11/29/15: cholesterol 149, triglycerides 52, LDL 72, HDL 67. A1c 6%.   Wt Readings from Last 3 Encounters:  05/08/16 171 lb (77.6 kg)  04/05/16 170 lb (77.1 kg)  01/09/16 176 lb 6.4 oz (80 kg)      Other studies Reviewed: Additional studies/ records that were reviewed today include: Cardiac cath Review of the  above records demonstrates:  Conclusion   1. Prox RCA to Dist RCA lesion, 30% stenosed. 2. Mid LAD lesion, 20% stenosed. The lesion was previously treated with a stent (unknown type). 3. Prox LAD to Mid LAD lesion, 90% stenosed. Post intervention, there is a 10% residual stenosis.    90% stenosis proximal to the previously placed LAD stent within the heavily calcified segment.  Dominant right coronary which wraps around the left ventricular apex. Minimal luminal irregularities are noted.  Patent circumflex and left main.  Successful percutaneous intervention with Cutting Balloon angioplasty of the proximal stenosis followed by  drug-eluting stent implantation reducing the 90% stenosis to 10% with TIMI grade 3 flow. Postdilatation was to 3.75 mm at 16 atm.  Normal left ventricular systolic function with normal end-diastolic pressure.  Recommendations:   Continue Plavix and aspirin  Discharge tomorrow if no complications overnight.      ASSESSMENT AND PLAN:  1.  Coronary artery disease- native vessel with stable angina class 2. Remote stenting of the LAD in 2003 with a Cypher stent. S/p stenting of the proximal LAD in May 2017 with DES.  Now on DAPT with ASA and Plavix. On beta blocker and ACEi. Plan to continue Plavix indefinitely with first generation DES. May want to consider follow up ETT at one year since symptoms previously were minimal.   2. Hypercholesterolemia. He reports insurance will no long cover Vytorin. History of intolerance to Atorvastatin. Now on simvastatin only. Scheduled for fasting lab work with primary care in one month. Will ask them to send [place] a copy.  3. HTN controlled.    Current medicines are reviewed at length with the patient today.  The patient does not have concerns regarding medicines.  The following changes have been made:  no change  Labs/ tests ordered today include:  No orders of the defined types were placed in this  encounter.    Disposition:   Follow up in 6 months.  Signed, Bralon Antkowiak [name], MD  05/08/2016 9:42 AM    [facility] [address] [place], [place], [address] Phone [phone], Fax [phone]

## 2016-05-08 ENCOUNTER — Ambulatory Visit (INDEPENDENT_AMBULATORY_CARE_PROVIDER_SITE_OTHER): Payer: Medicare Other | Admitting: Cardiology

## 2016-05-08 ENCOUNTER — Encounter: Payer: Self-pay | Admitting: Cardiology

## 2016-05-08 VITALS — BP 160/80 | Ht 71.0 in | Wt 171.0 lb

## 2016-05-08 DIAGNOSIS — E7800 Pure hypercholesterolemia, unspecified: Secondary | ICD-10-CM | POA: Diagnosis not present

## 2016-05-08 DIAGNOSIS — I2510 Atherosclerotic heart disease of native coronary artery without angina pectoris: Secondary | ICD-10-CM

## 2016-05-08 DIAGNOSIS — I209 Angina pectoris, unspecified: Secondary | ICD-10-CM

## 2016-05-08 DIAGNOSIS — I10 Essential (primary) hypertension: Secondary | ICD-10-CM

## 2016-05-08 NOTE — Patient Instructions (Signed)
Continue your current therapy  I will see you in 6 months.   

## 2016-05-09 DIAGNOSIS — R51 Headache: Secondary | ICD-10-CM | POA: Diagnosis not present

## 2016-05-09 DIAGNOSIS — J069 Acute upper respiratory infection, unspecified: Secondary | ICD-10-CM | POA: Diagnosis not present

## 2016-05-09 DIAGNOSIS — Z6824 Body mass index (BMI) 24.0-24.9, adult: Secondary | ICD-10-CM | POA: Diagnosis not present

## 2016-06-06 DIAGNOSIS — R7303 Prediabetes: Secondary | ICD-10-CM | POA: Diagnosis not present

## 2016-06-06 DIAGNOSIS — I2510 Atherosclerotic heart disease of native coronary artery without angina pectoris: Secondary | ICD-10-CM | POA: Diagnosis not present

## 2016-06-06 DIAGNOSIS — E785 Hyperlipidemia, unspecified: Secondary | ICD-10-CM | POA: Diagnosis not present

## 2016-06-06 DIAGNOSIS — I10 Essential (primary) hypertension: Secondary | ICD-10-CM | POA: Diagnosis not present

## 2016-06-06 DIAGNOSIS — I252 Old myocardial infarction: Secondary | ICD-10-CM | POA: Diagnosis not present

## 2016-06-06 DIAGNOSIS — Z6824 Body mass index (BMI) 24.0-24.9, adult: Secondary | ICD-10-CM | POA: Diagnosis not present

## 2016-06-06 DIAGNOSIS — Z23 Encounter for immunization: Secondary | ICD-10-CM | POA: Diagnosis not present

## 2016-08-19 DIAGNOSIS — L570 Actinic keratosis: Secondary | ICD-10-CM | POA: Diagnosis not present

## 2016-08-19 DIAGNOSIS — D1801 Hemangioma of skin and subcutaneous tissue: Secondary | ICD-10-CM | POA: Diagnosis not present

## 2016-08-19 DIAGNOSIS — D235 Other benign neoplasm of skin of trunk: Secondary | ICD-10-CM | POA: Diagnosis not present

## 2016-08-19 DIAGNOSIS — L821 Other seborrheic keratosis: Secondary | ICD-10-CM | POA: Diagnosis not present

## 2016-08-19 DIAGNOSIS — Z85820 Personal history of malignant melanoma of skin: Secondary | ICD-10-CM | POA: Diagnosis not present

## 2016-10-16 ENCOUNTER — Other Ambulatory Visit: Payer: Self-pay | Admitting: Dermatology

## 2016-10-16 DIAGNOSIS — L570 Actinic keratosis: Secondary | ICD-10-CM | POA: Diagnosis not present

## 2016-10-16 DIAGNOSIS — C44311 Basal cell carcinoma of skin of nose: Secondary | ICD-10-CM | POA: Diagnosis not present

## 2016-10-16 DIAGNOSIS — D485 Neoplasm of uncertain behavior of skin: Secondary | ICD-10-CM | POA: Diagnosis not present

## 2016-11-17 NOTE — Progress Notes (Signed)
Cardiology Office Note   Date:  11/20/2016   ID:  Kevin Stewart, DOB April 29, 1941, MRN [id number]  PCP:  Kevin Sake, MD  Cardiologist:   Kevin Gaetano [name], MD   Chief Complaint  Patient presents with  . Coronary Artery Disease  . Hyperlipidemia      History of Present Illness: Kevin Stewart is a 76 y.o. male who presents to establish cardiac care. He has a history of CAD. He presented in 2003 with a "light" heart attack and had stenting of the proximal LAD with a 3.5 x 18 mm Cypher stent in [facility]. He has been on chronic ASA and Plavix. He was seen last spring with  some angina with heavy physical activity. He has risk factors of HTN, hypercholesterolemia, and family history of CAD. He had an ETT that was abnormal and underwent cardiac cath on 12/20/15 demonstrating a 99% proximal LAD stenosis proximal to the prior stent. This was treated with DES. On follow up today he is doing very well. States when he does heavy lifting his chest lets him know to stop. Otherwise no significant angina. He does a lot of walking and work around the house. Reports BP is better at home. Still working part time.    Past Medical History:  Diagnosis Date  . Arthritis    "hands" (12/20/2015)  . Basal cell carcinoma, face   . Coronary artery disease    a. 2003 - light MI per patient; s/p Cypher DES to prox LAD in [facility].  Marland Kitchen Hx of adenomatous colonic polyps   . Hyperlipemia   . Hyperlipidemia   . Hypertension   . Kidney stones    "passed them"  . Melanoma of right side of neck ([place])    removed posterior neck 12/312013  . Migraine    "stopped in 2001 when I had my heart attack"  . Myocardial infarction ([place])   . Pre-diabetes   . Wears dentures    upper-lower partial  . Wears glasses     Past Surgical History:  Procedure Laterality Date  . CARDIAC CATHETERIZATION N/A 12/20/2015   Procedure: Left Heart Cath and Coronary Angiography;  Surgeon: Belva Crome, MD;  Location: [facility];  Service: Cardiovascular;  Laterality: N/A;  . CARDIAC CATHETERIZATION N/A 12/20/2015   Procedure: Coronary Stent Intervention;  Surgeon: Belva Crome, MD;  Location: [facility];  Service: Cardiovascular;  Laterality: N/A;  . CATARACT EXTRACTION W/ INTRAOCULAR LENS  IMPLANT, BILATERAL Bilateral 2011  . COLONOSCOPY    . CORONARY ANGIOPLASTY WITH STENT PLACEMENT  2001   "1 stent"  . CORONARY ANGIOPLASTY WITH STENT PLACEMENT  12/20/2015   "1 stent"  . HEMORRHOID SURGERY     "burned them off"  . MELANOMA EXCISION WITH SENTINEL LYMPH NODE BIOPSY  07/21/2012   Procedure: MELANOMA EXCISION WITH SENTINEL LYMPH NODE BIOPSY;  Surgeon: Rozetta Nunnery, MD;  Location: [facility];  Service: General;  Laterality: N/A;  melanoma excision with sentinel node biopsy   posterior right neck  . TONSILLECTOMY  1961     Current Outpatient Prescriptions  Medication Sig Dispense Refill  . aspirin 81 MG tablet Take 81 mg by mouth daily.    . carvedilol (COREG) 12.5 MG tablet Take 12.5 mg by mouth 2 (two) times daily with a meal.    . clopidogrel (PLAVIX) 75 MG tablet Take 75 mg by mouth daily.    . Enalapril-Hydrochlorothiazide 5-12.5 MG tablet  Take 1 tablet by mouth daily.    . fluticasone (FLONASE) 50 MCG/ACT nasal spray Place 1 spray into both nostrils as needed.  0  . naphazoline-pheniramine (EYE ALLERGY RELIEF) 0.025-0.3 % ophthalmic solution Place 2 drops into both eyes 2 (two) times daily.    . nitroGLYCERIN (NITROSTAT) 0.4 MG SL tablet Place 1 tablet (0.4 mg total) under the tongue every 5 (five) minutes as needed for chest pain (up to 3 doses). 25 tablet 3  . Omega-3 Fatty Acids (OMEGA-3 IQ PO) Take 1 tablet by mouth daily.    . simvastatin (ZOCOR) 40 MG tablet   3   No current facility-administered medications for this visit.     Allergies:   Shrimp [shellfish allergy]    Social History:  The patient  reports that he quit smoking about 43 years ago. His  smoking use included Cigarettes. He has a 20.00 pack-year smoking history. He has quit using smokeless tobacco. His smokeless tobacco use included Chew. He reports that he drinks alcohol. He reports that he does not use drugs.   Family History:  The patient's family history includes Cancer - Colon (age of onset: 22) in his brother; Cancer - Other (age of onset: 48) in his father; Cancer - Other (age of onset: 81) in his sister; Heart attack (age of onset: 1) in his mother; Heart attack (age of onset: 18) in his brother.    ROS:  Please see the history of present illness.  He needs surgery for skin cancer on nose. Otherwise, review of systems are positive for none.   All other systems are reviewed and negative.    PHYSICAL EXAM: VS:  BP (!) 142/82   Pulse 62   Ht '5\' 11"'  (1.803 m)   Wt 172 lb 3.2 oz (78.1 kg)   BMI 24.02 kg/m  , BMI Body mass index is 24.02 kg/m. GEN: Well nourished, well developed, in no acute distress  HEENT: normal  Neck: no JVD, carotid bruits, or masses Cardiac: RRR; normal S1-2. no murmurs, rubs, or gallops,no edema  Respiratory:  clear to auscultation bilaterally, normal work of breathing GI: soft, nontender, nondistended, + BS MS: no deformity or atrophy  Skin: warm and dry, no rash Neuro:  Strength and sensation are intact Psych: euthymic mood, full affect   EKG:  EKG is  ordered today. NSR with old septal infarct. No change. I have personally reviewed and interpreted this study.    Recent Labs: 12/21/2015: BUN 11; Creatinine, Ser 0.79; Hemoglobin 13.8; Platelets 209; Potassium 3.9; Sodium 138    Lipid Panel No results found for: CHOL, TRIG, HDL, CHOLHDL, VLDL, LDLCALC, LDLDIRECT  Labs dated 06/06/16: cholesterol 181, triglycerides 48, HDL 62, LDL 109. A1c 5.5%. CMET normal.   Wt Readings from Last 3 Encounters:  11/20/16 172 lb 3.2 oz (78.1 kg)  05/08/16 171 lb (77.6 kg)  04/05/16 170 lb (77.1 kg)      Other studies Reviewed: Additional  studies/ records that were reviewed today include: Cardiac cath Review of the above records demonstrates:  Conclusion   1. Prox RCA to Dist RCA lesion, 30% stenosed. 2. Mid LAD lesion, 20% stenosed. The lesion was previously treated with a stent (unknown type). 3. Prox LAD to Mid LAD lesion, 90% stenosed. Post intervention, there is a 10% residual stenosis.    90% stenosis proximal to the previously placed LAD stent within the heavily calcified segment.  Dominant right coronary which wraps around the left ventricular apex. Minimal luminal irregularities are  noted.  Patent circumflex and left main.  Successful percutaneous intervention with Cutting Balloon angioplasty of the proximal stenosis followed by drug-eluting stent implantation reducing the 90% stenosis to 10% with TIMI grade 3 flow. Postdilatation was to 3.75 mm at 16 atm.  Normal left ventricular systolic function with normal end-diastolic pressure.  Recommendations:   Continue Plavix and aspirin  Discharge tomorrow if no complications overnight.      ASSESSMENT AND PLAN:  1.  Coronary artery disease- native vessel with stable angina class 1-2. Remote stenting of the LAD in 2003 with a Cypher stent. S/p stenting of the proximal LAD in May 2017 with DES.  Now on DAPT with ASA and Plavix. On beta blocker and ACEi. Plan to continue Plavix indefinitely with first generation DES. Recommend  follow up ETT now  since symptoms previously were minimal.   2. Hypercholesterolemia. Did very well on Vytorin in past but insurance coverage was poor. History of intolerance to Atorvastatin. Now on simvastatin only. Review of last lipid panel is not ideal with LDL 109. Will start Zetia 10 mg daily. If cost is too high may switch Zocor to Crestor for higher potency.   3. HTN improved control here and even better readings at home.    Current medicines are reviewed at length with the patient today.  The patient does not have concerns  regarding medicines.  The following changes have been made:  no change  Labs/ tests ordered today include:  No orders of the defined types were placed in this encounter.    Disposition:   Follow up in 6 months.  Signed, Amandajo Gonder [name], MD  11/20/2016 8:03 AM    [facility] [address], [place], [place], [address] Phone [phone], Fax [phone]

## 2016-11-20 ENCOUNTER — Encounter: Payer: Self-pay | Admitting: Cardiology

## 2016-11-20 ENCOUNTER — Ambulatory Visit (INDEPENDENT_AMBULATORY_CARE_PROVIDER_SITE_OTHER): Payer: Medicare Other | Admitting: Cardiology

## 2016-11-20 VITALS — BP 142/82 | HR 62 | Ht 71.0 in | Wt 172.2 lb

## 2016-11-20 DIAGNOSIS — I10 Essential (primary) hypertension: Secondary | ICD-10-CM | POA: Diagnosis not present

## 2016-11-20 DIAGNOSIS — E7800 Pure hypercholesterolemia, unspecified: Secondary | ICD-10-CM

## 2016-11-20 DIAGNOSIS — I209 Angina pectoris, unspecified: Secondary | ICD-10-CM | POA: Diagnosis not present

## 2016-11-20 DIAGNOSIS — I25118 Atherosclerotic heart disease of native coronary artery with other forms of angina pectoris: Secondary | ICD-10-CM

## 2016-11-20 MED ORDER — EZETIMIBE 10 MG PO TABS
10.0000 mg | ORAL_TABLET | Freq: Every day | ORAL | 3 refills | Status: DC
Start: 2016-11-20 — End: 2016-12-26

## 2016-11-20 NOTE — Addendum Note (Signed)
Addended by: Kathyrn Lass on: 11/20/2016 08:20 AM   Modules accepted: Orders

## 2016-11-20 NOTE — Patient Instructions (Signed)
We will start Zetia 10 mg daily. Continue your other therapy  We will arrange a follow up stress test next month.  I will see you in 6 months.

## 2016-11-28 ENCOUNTER — Telehealth: Payer: Self-pay

## 2016-11-28 NOTE — Telephone Encounter (Signed)
Patient calling to inform me that he cannot tolerate Zetia 10 mg which Dr. [name] prescribed at last OV 11/20/16. Patient state she feels nauseous, fatigues, lightheaded, and can barely work his job when he takes the medication. He informed me that he will no longer continue to take the medication but will continue to take Simvastatin 40 mg as prescribed. He will be in the office 12/25/16 for testing and would like to further discuss at that time. I told him I would get hiss message to Dr. [name] and nurse and when Dr. [name] advised someone would return his call. He is agreeable.

## 2016-11-30 NOTE — Telephone Encounter (Signed)
He should go ahead and stop Zetia. It is interesting that he took Vytorin without problems before which is just a combination of Zocor and Zetia. We can see what his follow up blood work looks like. Other option is to try a more potent statin - Crestor 40 mg daily or consider a PCSK 9 inhibitor.  Peter [name] MD, [facility]

## 2016-12-02 NOTE — Telephone Encounter (Signed)
Spoke with pt wife, aware of dr Doug Sou recommendations. She will make him aware and have him call us back.

## 2016-12-04 DIAGNOSIS — C44311 Basal cell carcinoma of skin of nose: Secondary | ICD-10-CM | POA: Diagnosis not present

## 2016-12-19 DIAGNOSIS — R7303 Prediabetes: Secondary | ICD-10-CM | POA: Diagnosis not present

## 2016-12-19 DIAGNOSIS — I252 Old myocardial infarction: Secondary | ICD-10-CM | POA: Diagnosis not present

## 2016-12-19 DIAGNOSIS — I2510 Atherosclerotic heart disease of native coronary artery without angina pectoris: Secondary | ICD-10-CM | POA: Diagnosis not present

## 2016-12-19 DIAGNOSIS — I10 Essential (primary) hypertension: Secondary | ICD-10-CM | POA: Diagnosis not present

## 2016-12-19 DIAGNOSIS — Z6824 Body mass index (BMI) 24.0-24.9, adult: Secondary | ICD-10-CM | POA: Diagnosis not present

## 2016-12-19 DIAGNOSIS — E785 Hyperlipidemia, unspecified: Secondary | ICD-10-CM | POA: Diagnosis not present

## 2016-12-20 ENCOUNTER — Telehealth (HOSPITAL_COMMUNITY): Payer: Self-pay | Admitting: *Deleted

## 2016-12-20 NOTE — Telephone Encounter (Signed)
Close encounter 

## 2016-12-25 ENCOUNTER — Ambulatory Visit (HOSPITAL_COMMUNITY)
Admission: RE | Admit: 2016-12-25 | Discharge: 2016-12-25 | Disposition: A | Discharge status: Home / Self Care | Payer: Medicare Other | Source: Ambulatory Visit | Attending: Cardiology | Admitting: Cardiology

## 2016-12-25 ENCOUNTER — Encounter (HOSPITAL_COMMUNITY): Payer: Self-pay | Admitting: *Deleted

## 2016-12-25 DIAGNOSIS — E7800 Pure hypercholesterolemia, unspecified: Secondary | ICD-10-CM | POA: Diagnosis not present

## 2016-12-25 DIAGNOSIS — I10 Essential (primary) hypertension: Secondary | ICD-10-CM | POA: Insufficient documentation

## 2016-12-25 DIAGNOSIS — I25118 Atherosclerotic heart disease of native coronary artery with other forms of angina pectoris: Secondary | ICD-10-CM | POA: Insufficient documentation

## 2016-12-25 DIAGNOSIS — R9439 Abnormal result of other cardiovascular function study: Secondary | ICD-10-CM | POA: Insufficient documentation

## 2016-12-25 DIAGNOSIS — I209 Angina pectoris, unspecified: Secondary | ICD-10-CM | POA: Diagnosis not present

## 2016-12-25 LAB — EXERCISE TOLERANCE TEST
CHL CUP MPHR: 146 {beats}/min
CHL CUP RESTING HR STRESS: 69 {beats}/min
CSEPEDS: 36 s
CSEPEW: 11 METS
CSEPPHR: 150 {beats}/min
Exercise duration (min): 9 min
Percent HR: 102 %
RPE: 18

## 2016-12-25 NOTE — Progress Notes (Unsigned)
Abnormal ETT was reviewed by Dr. [name]. Patient was given the okay to be discharged to go home.

## 2016-12-26 ENCOUNTER — Other Ambulatory Visit: Payer: Self-pay

## 2017-01-02 ENCOUNTER — Telehealth: Payer: Self-pay | Admitting: Cardiology

## 2017-01-02 NOTE — Telephone Encounter (Signed)
Or [phone] --pt calling regarding cholesterol med that he can't take, was su[ppose to be changed to another med

## 2017-01-02 NOTE — Telephone Encounter (Signed)
Left detailed message-lipid clinic appt.  Sent message to scheduling to call and schedule lipid clinic appt

## 2017-01-02 NOTE — Telephone Encounter (Signed)
He did not tolerate Zetia or high dose Crestor. Will need to be seen in lipid clinic to consider PCSK 9 inhibitor.  Neshawn Aird [name] MD, [facility]

## 2017-01-03 ENCOUNTER — Telehealth: Payer: Self-pay | Admitting: Cardiology

## 2017-01-03 NOTE — Telephone Encounter (Signed)
Lipid clinic appointment scheduled 01/15/17 at 9:00 am.

## 2017-01-03 NOTE — Telephone Encounter (Signed)
Called the patient and left a VM to call back to schedule lipid clinic appointment.

## 2017-01-14 NOTE — Progress Notes (Signed)
01/15/2017 Kevin Stewart 12/22/1940 [id number]   HPI:  Kevin Stewart is a 76 y.o. male patient of Dr [name], who presents today for a lipid clinic evaluation.  His medical history is significant for MI with stent to LAD (2000).  In 2017 he had another stent placed just proximal to the first, and is now doing well.  He states today that he is not much interested in further lowering of his cholesterol.  He was on Vytorin 10/40 for 10+ years and always did well, but when it went generic he had trouble getting it covered by his insurance.  He was then put on simvastatin 40.  Just recently ezetimibe 10 mg daily was added, but he reported muscle aches, GI upset and weakness when he took that, so he continues on with just the simvastatin.      Current Medications:  Simvastatin 40 mg  Cholesterol Goals:   LDL < 70  Intolerant/previously tried:  vytorin since 200 (10/40) -  Was not covered on insurance once went generic  States tried others at that time but developed myalgias, unsure of which he tried   Family history:   Mother died at 2 MI  Brother 5 stents, still living  Diet:   Eats a mix of home cooked (with garden fresh vegetables) and restaurant; admits to liking fried foods, white foods and cheese;   Exercise:    Work around yard, garden, workshop, pastures, has 30 acres Labs:   11/2016:  TC 166, TG 53, HDL 56, LDL 99  Current Outpatient Prescriptions  Medication Sig Dispense Refill  . aspirin 81 MG tablet Take 81 mg by mouth daily.    . carvedilol (COREG) 12.5 MG tablet Take 12.5 mg by mouth 2 (two) times daily with a meal.    . clopidogrel (PLAVIX) 75 MG tablet Take 75 mg by mouth daily.    . Enalapril-Hydrochlorothiazide 5-12.5 MG tablet Take 1 tablet by mouth daily.    Marland Kitchen ezetimibe-simvastatin (VYTORIN) 10-40 MG tablet Take 1 tablet by mouth daily. 30 tablet 1  . fluticasone (FLONASE) 50 MCG/ACT nasal spray Place 1 spray into both nostrils as needed.  0  .  naphazoline-pheniramine (EYE ALLERGY RELIEF) 0.025-0.3 % ophthalmic solution Place 2 drops into both eyes 2 (two) times daily.    . nitroGLYCERIN (NITROSTAT) 0.4 MG SL tablet Place 1 tablet (0.4 mg total) under the tongue every 5 (five) minutes as needed for chest pain (up to 3 doses). 25 tablet 3  . Omega-3 Fatty Acids (OMEGA-3 IQ PO) Take 1 tablet by mouth daily.    . simvastatin (ZOCOR) 40 MG tablet   3   No current facility-administered medications for this visit.     Allergies  Allergen Reactions  . Shrimp [Shellfish Allergy] Nausea And Vomiting    Past Medical History:  Diagnosis Date  . Arthritis    "hands" (12/20/2015)  . Basal cell carcinoma, face   . Coronary artery disease    a. 2003 - light MI per patient; s/p Cypher DES to prox LAD in [facility].  Marland Kitchen Hx of adenomatous colonic polyps   . Hyperlipemia   . Hyperlipidemia   . Hypertension   . Kidney stones    "passed them"  . Melanoma of right side of neck ([place])    removed posterior neck 12/312013  . Migraine    "stopped in 2001 when I had my heart attack"  . Myocardial infarction ([place])   . Pre-diabetes   . Wears dentures  upper-lower partial  . Wears glasses     There were no vitals taken for this visit.   Hyperlipemia Patient with history of MI with stent in 2000.  Current LDL is 99 with a goal of < 70.  Patient is tolerating simvastatin 40 mg without incident.  He would like to try getting vytorin 10/40 again.  Will send prescription to his pharmacy to price with his insurance, also gave him information on [facility].   Gave him information about PCSK-9 inhibitors as well as CLEAR trial.  He will think about these, but for now not interested.     Tommy Medal PharmD CPP [facility]

## 2017-01-15 ENCOUNTER — Encounter: Payer: Self-pay | Admitting: Pharmacist Clinician (PhC)/ Clinical Pharmacy Specialist

## 2017-01-15 ENCOUNTER — Telehealth: Payer: Self-pay | Admitting: Pharmacist Clinician (PhC)/ Clinical Pharmacy Specialist

## 2017-01-15 ENCOUNTER — Ambulatory Visit (INDEPENDENT_AMBULATORY_CARE_PROVIDER_SITE_OTHER): Payer: Medicare Other | Admitting: Pharmacist Clinician (PhC)/ Clinical Pharmacy Specialist

## 2017-01-15 DIAGNOSIS — I209 Angina pectoris, unspecified: Secondary | ICD-10-CM

## 2017-01-15 DIAGNOSIS — E7800 Pure hypercholesterolemia, unspecified: Secondary | ICD-10-CM | POA: Diagnosis not present

## 2017-01-15 MED ORDER — EZETIMIBE-SIMVASTATIN 10-40 MG PO TABS: 1.0000 | ORAL_TABLET | Freq: Every day | ORAL | 1 refills | Status: DC

## 2017-01-15 NOTE — Telephone Encounter (Signed)
Patient called to report price of vytorin 10/40 generic on his insurance was > $200 per month.  Found the med available at [facility] for $72 per month.  Patient would like prescription sent there and he will try for a month.  If he tolerates it well, will continue for 3 months then repeat labs.   Patient knows to call in a month and let us know if he will continue or wants to go back to simvastatin 40

## 2017-01-15 NOTE — Patient Instructions (Signed)
Look into prices on generic Vytorin 10/40.  Call if you would like [place] to send in a prescription for you.  (Alyss Granato/Raquel [phone]).  In the meantime continue with simvastatin 40 mg daily.   Cholesterol Cholesterol is a fat. Your body needs a small amount of cholesterol. Cholesterol (plaque) may build up in your blood vessels (arteries). That makes you more likely to have a heart attack or stroke. You cannot feel your cholesterol level. Having a blood test is the only way to find out if your level is high. Keep your test results. Work with your doctor to keep your cholesterol at a good level. What do the results mean?  Total cholesterol is how much cholesterol is in your blood.  LDL is bad cholesterol. This is the type that can build up. Try to have low LDL.  HDL is good cholesterol. It cleans your blood vessels and carries LDL away. Try to have high HDL.  Triglycerides are fat that the body can store or burn for energy. What are good levels of cholesterol?  Total cholesterol below 200.  LDL below 100 is good for people who have health risks. LDL below 70 is good for people who have very high risks.  HDL above 40 is good. It is best to have HDL of 60 or higher.  Triglycerides below 150. How can I lower my cholesterol? Diet Follow your diet program as told by your doctor.  Choose fish, white meat chicken, or [place] that is roasted or baked. Try not to eat red meat, fried foods, sausage, or lunch meats.  Eat lots of fresh fruits and vegetables.  Choose whole grains, beans, pasta, potatoes, and cereals.  Choose olive oil, corn oil, or canola oil. Only use small amounts.  Try not to eat butter, mayonnaise, shortening, or palm kernel oils.  Try not to eat foods with trans fats.  Choose low-fat or nonfat dairy foods. ? Drink skim or nonfat milk. ? Eat low-fat or nonfat yogurt and cheeses. ? Try not to drink whole milk or cream. ? Try not to eat ice cream, egg yolks, or  full-fat cheeses.  Healthy desserts include angel food cake, ginger snaps, animal crackers, hard candy, popsicles, and low-fat or nonfat frozen yogurt. Try not to eat pastries, cakes, pies, and cookies.  Exercise Follow your exercise program as told by your doctor.  Be more active. Try gardening, walking, and taking the stairs.  Ask your doctor about ways that you can be more active.  Medicine  Take over-the-counter and prescription medicines only as told by your doctor.  This information is not intended to replace advice given to you by your health care provider. Make sure you discuss any questions you have with your health care provider. Document Released: 10/04/2008 Document Revised: 02/07/2016 Document Reviewed: 01/18/2016 Elsevier Interactive Patient Education  2017 [employer].

## 2017-01-15 NOTE — Assessment & Plan Note (Addendum)
Patient with history of MI with stent in 2000.  Current LDL is 99 with a goal of < 70.  Patient is tolerating simvastatin 40 mg without incident.  He would like to try getting vytorin 10/40 again.  Will send prescription to his pharmacy to price with his insurance, also gave him information on [facility].   Gave him information about PCSK-9 inhibitors as well as CLEAR trial.  He will think about these, but for now not interested.

## 2017-01-30 ENCOUNTER — Telehealth: Payer: Self-pay | Admitting: *Deleted

## 2017-01-30 NOTE — Telephone Encounter (Signed)
Per pharmacist noted on 01/15/17:   "Patient called to report price of vytorin 10/40 generic on his insurance was > $200 per month.  Found the med available at [facility] for $72 per month.  Patient would like prescription sent there and he will try for a month.  If he tolerates it well, will continue for 3 months then repeat labs.   Patient knows to call in a month and let us know if he will continue or wants to go back to simvastatin 40"   Patient paying for medication out of pocket.

## 2017-01-30 NOTE — Telephone Encounter (Signed)
Received fax from [facility] with a notification that patients insurance will not cover ezetimibe-simvastatin.  States patients insurance will cover atorvastatin, lovastatin, and rosuvastatin.     Patient being followed in lipid clinic, will route to lipid clinic for recommendations.

## 2017-01-30 NOTE — Telephone Encounter (Signed)
Noted  

## 2017-02-17 ENCOUNTER — Other Ambulatory Visit: Payer: Self-pay | Admitting: Pharmacist Clinician (PhC)/ Clinical Pharmacy Specialist

## 2017-02-17 DIAGNOSIS — E785 Hyperlipidemia, unspecified: Secondary | ICD-10-CM

## 2017-02-19 DIAGNOSIS — D3610 Benign neoplasm of peripheral nerves and autonomic nervous system, unspecified: Secondary | ICD-10-CM | POA: Diagnosis not present

## 2017-02-19 DIAGNOSIS — L570 Actinic keratosis: Secondary | ICD-10-CM | POA: Diagnosis not present

## 2017-02-19 DIAGNOSIS — Z85828 Personal history of other malignant neoplasm of skin: Secondary | ICD-10-CM | POA: Diagnosis not present

## 2017-02-19 DIAGNOSIS — L821 Other seborrheic keratosis: Secondary | ICD-10-CM | POA: Diagnosis not present

## 2017-02-19 DIAGNOSIS — Z85820 Personal history of malignant melanoma of skin: Secondary | ICD-10-CM | POA: Diagnosis not present

## 2017-02-19 DIAGNOSIS — D225 Melanocytic nevi of trunk: Secondary | ICD-10-CM | POA: Diagnosis not present

## 2017-03-19 DIAGNOSIS — H47323 Drusen of optic disc, bilateral: Secondary | ICD-10-CM | POA: Diagnosis not present

## 2017-05-28 DIAGNOSIS — Z23 Encounter for immunization: Secondary | ICD-10-CM | POA: Diagnosis not present

## 2017-06-05 DIAGNOSIS — L989 Disorder of the skin and subcutaneous tissue, unspecified: Secondary | ICD-10-CM | POA: Diagnosis not present

## 2017-06-05 DIAGNOSIS — C44310 Basal cell carcinoma of skin of unspecified parts of face: Secondary | ICD-10-CM | POA: Diagnosis not present

## 2017-06-05 DIAGNOSIS — L570 Actinic keratosis: Secondary | ICD-10-CM | POA: Diagnosis not present

## 2017-06-05 DIAGNOSIS — C44319 Basal cell carcinoma of skin of other parts of face: Secondary | ICD-10-CM | POA: Diagnosis not present

## 2017-06-26 DIAGNOSIS — I252 Old myocardial infarction: Secondary | ICD-10-CM | POA: Diagnosis not present

## 2017-06-26 DIAGNOSIS — Z6824 Body mass index (BMI) 24.0-24.9, adult: Secondary | ICD-10-CM | POA: Diagnosis not present

## 2017-06-26 DIAGNOSIS — I10 Essential (primary) hypertension: Secondary | ICD-10-CM | POA: Diagnosis not present

## 2017-06-26 DIAGNOSIS — E785 Hyperlipidemia, unspecified: Secondary | ICD-10-CM | POA: Diagnosis not present

## 2017-06-26 DIAGNOSIS — I2510 Atherosclerotic heart disease of native coronary artery without angina pectoris: Secondary | ICD-10-CM | POA: Diagnosis not present

## 2017-07-01 NOTE — Progress Notes (Signed)
Cardiology Office Note   Date:  07/02/2017   ID:  Kevin Stewart, DOB 18-May-1941, MRN [id number]  PCP:  [name], NP  Cardiologist:   Brynnan Rodenbaugh [name], MD   Chief Complaint  Patient presents with  . Follow-up    6 months;      History of Present Illness: Kevin Stewart is a 76 y.o. male who is seen for follow up CAD.  He presented in 2003 with a "light" heart attack and had stenting of the proximal LAD with a 3.5 x 18 mm Cypher stent in [facility]. He has been on chronic ASA and Plavix.  He has risk factors of HTN, hypercholesterolemia, and family history of CAD. In the spring of 2017 he presented with exertional angina.  He had an ETT that was abnormal and underwent cardiac cath on 12/20/15 demonstrating a 99% proximal LAD stenosis proximal to the prior stent. This was treated with DES. He had a follow up  ETT in June 2018 which showed a marked improvement. On his last visit Zetia was added for hypercholesterolemia. He did not tolerate this well and wanted to switch to Vytorin which he has tolerated in the past. When he took the 10/40 mg he continued to have symptoms and stopped it altogether.   On follow up today he is doing very well. He only gets angina when he is doing things he shouldn't.  Otherwise no significant angina. He does a lot of walking and work around the house. BP at home has been well controlled.    Past Medical History:  Diagnosis Date  . Arthritis    "hands" (12/20/2015)  . Basal cell carcinoma, face   . Coronary artery disease    a. 2003 - light MI per patient; s/p Cypher DES to prox LAD in [facility].  Marland Kitchen Hx of adenomatous colonic polyps   . Hyperlipemia   . Hyperlipidemia   . Hypertension   . Kidney stones    "passed them"  . Melanoma of right side of neck ([place])    removed posterior neck 12/312013  . Migraine    "stopped in 2001 when I had my heart attack"  . Myocardial infarction ([place])   . Pre-diabetes   . Wears dentures    upper-lower  partial  . Wears glasses     Past Surgical History:  Procedure Laterality Date  . CARDIAC CATHETERIZATION N/A 12/20/2015   Procedure: Left Heart Cath and Coronary Angiography;  Surgeon: Belva Crome, MD;  Location: [facility];  Service: Cardiovascular;  Laterality: N/A;  . CARDIAC CATHETERIZATION N/A 12/20/2015   Procedure: Coronary Stent Intervention;  Surgeon: Belva Crome, MD;  Location: [facility];  Service: Cardiovascular;  Laterality: N/A;  . CATARACT EXTRACTION W/ INTRAOCULAR LENS  IMPLANT, BILATERAL Bilateral 2011  . COLONOSCOPY    . CORONARY ANGIOPLASTY WITH STENT PLACEMENT  2001   "1 stent"  . CORONARY ANGIOPLASTY WITH STENT PLACEMENT  12/20/2015   "1 stent"  . HEMORRHOID SURGERY     "burned them off"  . MELANOMA EXCISION WITH SENTINEL LYMPH NODE BIOPSY  07/21/2012   Procedure: MELANOMA EXCISION WITH SENTINEL LYMPH NODE BIOPSY;  Surgeon: Rozetta Nunnery, MD;  Location: [facility];  Service: General;  Laterality: N/A;  melanoma excision with sentinel node biopsy   posterior right neck  . TONSILLECTOMY  1961     Current Outpatient Medications  Medication Sig Dispense Refill  . aspirin 81  MG tablet Take 81 mg by mouth daily.    . carvedilol (COREG) 12.5 MG tablet Take 12.5 mg by mouth 2 (two) times daily with a meal.    . clopidogrel (PLAVIX) 75 MG tablet Take 75 mg by mouth daily.    . Enalapril-Hydrochlorothiazide 5-12.5 MG tablet Take 1 tablet by mouth daily.    . fluticasone (FLONASE) 50 MCG/ACT nasal spray Place 1 spray into both nostrils as needed.  0  . naphazoline-pheniramine (EYE ALLERGY RELIEF) 0.025-0.3 % ophthalmic solution Place 2 drops into both eyes 2 (two) times daily.    . nitroGLYCERIN (NITROSTAT) 0.4 MG SL tablet Place 1 tablet (0.4 mg total) under the tongue every 5 (five) minutes as needed for chest pain (up to 3 doses). 25 tablet 3  . Omega-3 Fatty Acids (OMEGA-3 IQ PO) Take 1 tablet by mouth daily.    . rosuvastatin  (CRESTOR) 5 MG tablet Take 1 tablet (5 mg total) by mouth daily. 90 tablet 3   No current facility-administered medications for this visit.     Allergies:   Shrimp [shellfish allergy]    Social History:  The patient  reports that he quit smoking about 44 years ago. His smoking use included cigarettes. He has a 20.00 pack-year smoking history. He has quit using smokeless tobacco. His smokeless tobacco use included chew. He reports that he drinks alcohol. He reports that he does not use drugs.   Family History:  The patient's family history includes Cancer - Colon (age of onset: 79) in his brother; Cancer - Other (age of onset: 48) in his father; Cancer - Other (age of onset: 65) in his sister; Heart attack (age of onset: 40) in his mother; Heart attack (age of onset: 87) in his brother.    ROS:  Please see the history of present illness.     All other systems are reviewed and negative.    PHYSICAL EXAM: VS:  BP 132/64 (BP Location: Right Arm, Cuff Size: Normal)   Pulse 78   Ht _0  (1.803 m)   Wt 172 lb (78 kg)   BMI 23.99 kg/m  , BMI Body mass index is 23.99 kg/m. GENERAL:  Well appearing HEENT:  PERRL, EOMI, sclera are clear. Oropharynx is clear. NECK:  No jugular venous distention, carotid upstroke brisk and symmetric, no bruits, no thyromegaly or adenopathy LUNGS:  Clear to auscultation bilaterally CHEST:  Unremarkable HEART:  RRR,  PMI not displaced or sustained,S1 and S2 within normal limits, no S3, no S4: no clicks, no rubs, no murmurs ABD:  Soft, nontender. BS +, no masses or bruits. No hepatomegaly, no splenomegaly EXT:  2 + pulses throughout, no edema, no cyanosis no clubbing SKIN:  Warm and dry.  No rashes NEURO:  Alert and oriented x 3. Cranial nerves II through XII intact. PSYCH:  Cognitively intact     EKG:  EKG is  not ordered today.    Lab Results  Component Value Date   WBC 9.5 12/21/2015   HGB 13.8 12/21/2015   HCT 40.7 12/21/2015   PLT 209 12/21/2015    GLUCOSE 118 (H) 12/21/2015   NA 138 12/21/2015   K 3.9 12/21/2015   CL 104 12/21/2015   CREATININE 0.79 12/21/2015   BUN 11 12/21/2015   CO2 28 12/21/2015   INR 1.00 12/19/2015    Labs dated 06/06/16: cholesterol 181, triglycerides 48, HDL 62, LDL 109. A1c 5.5%. CMET normal. Dated 06/26/17: cholesterol 250, triglycerides 60, HDL 59, LDL 179. CMET  normal.   Wt Readings from Last 3 Encounters:  07/02/17 172 lb (78 kg)  11/20/16 172 lb 3.2 oz (78.1 kg)  05/08/16 171 lb (77.6 kg)      Other studies Reviewed: Additional studies/ records that were reviewed today include: Cardiac cath Review of the above records demonstrates:  Conclusion   1. Prox RCA to Dist RCA lesion, 30% stenosed. 2. Mid LAD lesion, 20% stenosed. The lesion was previously treated with a stent (unknown type). 3. Prox LAD to Mid LAD lesion, 90% stenosed. Post intervention, there is a 10% residual stenosis.    90% stenosis proximal to the previously placed LAD stent within the heavily calcified segment.  Dominant right coronary which wraps around the left ventricular apex. Minimal luminal irregularities are noted.  Patent circumflex and left main.  Successful percutaneous intervention with [place] angioplasty of the proximal stenosis followed by drug-eluting stent implantation reducing the 90% stenosis to 10% with TIMI grade 3 flow. Postdilatation was to 3.75 mm at 16 atm.  Normal left ventricular systolic function with normal end-diastolic pressure.  Recommendations:   Continue Plavix and aspirin  Discharge tomorrow if no complications overnight.    ETT 12/25/16: Study Highlights     Blood pressure demonstrated a hypertensive response to exercise.  Horizontal ST segment depression ST segment depression of 1 mm was noted during stress in the II, III, aVF, V5 and V6 leads, and returning to baseline after less than 1 minute of recovery.   1. Excellent exercise tolerance.  2. Hypertensive  BP response.  3. Equivocal changes on exercise ECG: about 1 mm horizontal ST depression in inferior leads and V5,V6 though difficult to tell for sure due to baseline artifact.  ST segments returned to baseline quickly in recovery.   Overall low risk study with excellent exercise tolerance but not normal.      ASSESSMENT AND PLAN:  1.  Coronary artery disease- native vessel with stable angina class 1-2. Remote stenting of the LAD in 2003 with a Cypher stent. S/p stenting of the proximal LAD in May 2017 with DES.  Now on DAPT with ASA and Plavix. On beta blocker and ACEi. Plan to continue Plavix indefinitely with first generation DES. Follow up ETT in June was low risk and much improved from prior.   2. Hypercholesterolemia. Did very well on Vytorin in past. Unable to tolerate generic though. History of intolerance to Atorvastatin. He is going to try taking 1/2 vytorin. If unable to tolerate will try low dose Crestor 5 mg daily. Follow up lab in 3 months.  3. HTN controlled.    Current medicines are reviewed at length with the patient today.  The patient does not have concerns regarding medicines.  The following changes have been made:  no change  Labs/ tests ordered today include:   Orders Placed This Encounter  Procedures  . Lipid Panel w/o Chol/HDL Ratio  . Hepatic function panel     Disposition:   Follow up in 6 months.  Signed, Christofer Shen [name], MD  07/02/2017 10:17 AM    [facility] [address], [place], [place], [address] Phone [phone], Fax [phone]

## 2017-07-02 ENCOUNTER — Encounter: Payer: Self-pay | Admitting: Cardiology

## 2017-07-02 ENCOUNTER — Ambulatory Visit (INDEPENDENT_AMBULATORY_CARE_PROVIDER_SITE_OTHER): Payer: Medicare Other | Admitting: Cardiology

## 2017-07-02 VITALS — BP 132/64 | HR 78 | Ht 71.0 in | Wt 172.0 lb

## 2017-07-02 DIAGNOSIS — E7800 Pure hypercholesterolemia, unspecified: Secondary | ICD-10-CM | POA: Diagnosis not present

## 2017-07-02 DIAGNOSIS — I25118 Atherosclerotic heart disease of native coronary artery with other forms of angina pectoris: Secondary | ICD-10-CM | POA: Diagnosis not present

## 2017-07-02 DIAGNOSIS — I10 Essential (primary) hypertension: Secondary | ICD-10-CM

## 2017-07-02 DIAGNOSIS — I209 Angina pectoris, unspecified: Secondary | ICD-10-CM

## 2017-07-02 MED ORDER — ROSUVASTATIN CALCIUM 5 MG PO TABS: 5.0000 mg | ORAL_TABLET | Freq: Every day | ORAL | 3 refills | Status: DC

## 2017-07-02 NOTE — Patient Instructions (Signed)
Continue your current therapy  We will try Crestor 5 mg daily and see if you can tolerate it.  We will repeat lab in 3 months  I will see you in 6 months

## 2017-07-18 DIAGNOSIS — D485 Neoplasm of uncertain behavior of skin: Secondary | ICD-10-CM | POA: Diagnosis not present

## 2017-07-18 DIAGNOSIS — L570 Actinic keratosis: Secondary | ICD-10-CM | POA: Diagnosis not present

## 2017-07-18 DIAGNOSIS — C44319 Basal cell carcinoma of skin of other parts of face: Secondary | ICD-10-CM | POA: Diagnosis not present

## 2017-07-24 IMAGING — CR DG CHEST 2V
2 series · 2 of 2 positions shown · non-contrast
Comparison: None in PACs

CLINICAL DATA: Preoperative study prior to coronary stent
placement. The patient is currently asymptomatic.

EXAM:
CHEST  2 VIEW

[w chest pa]
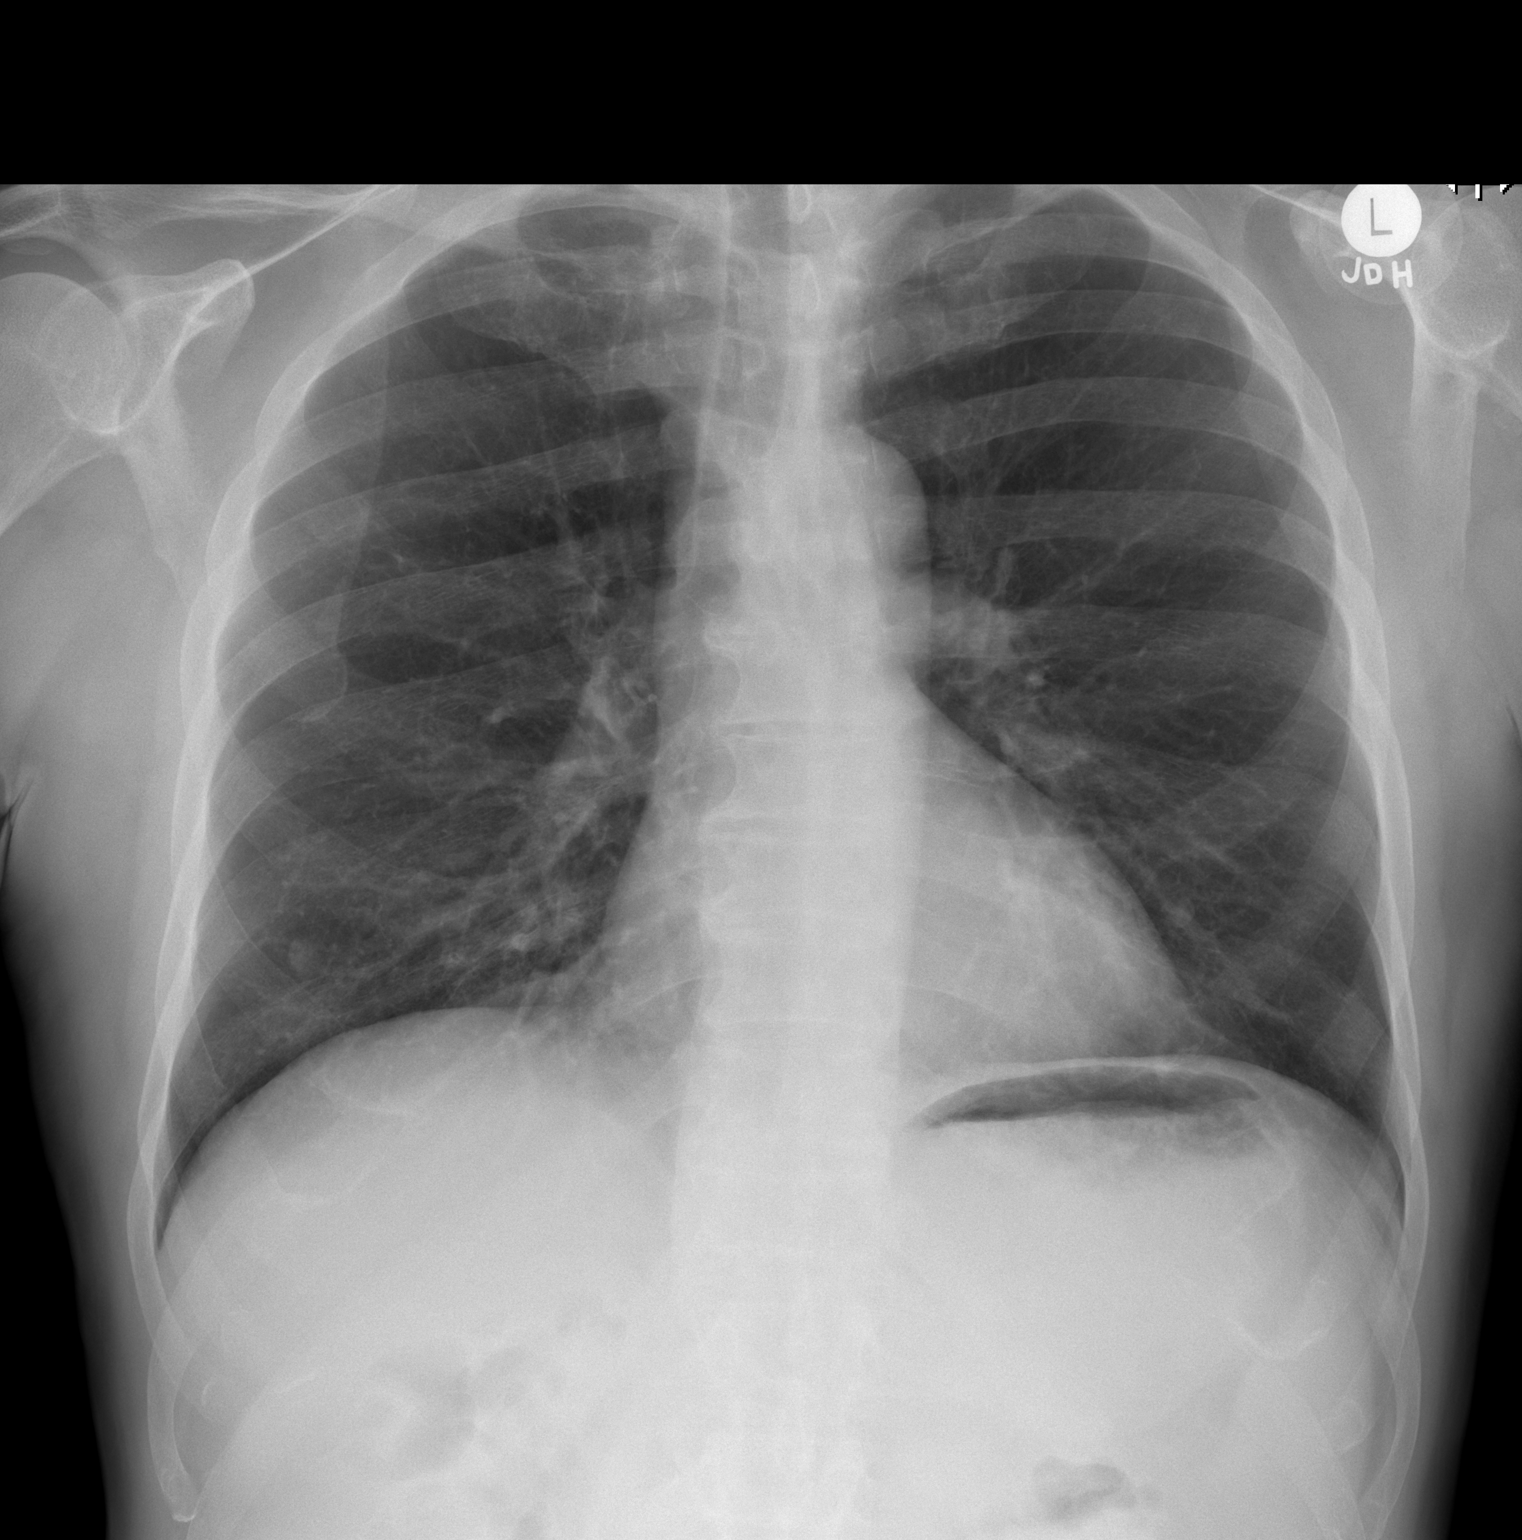

[w chest lat]
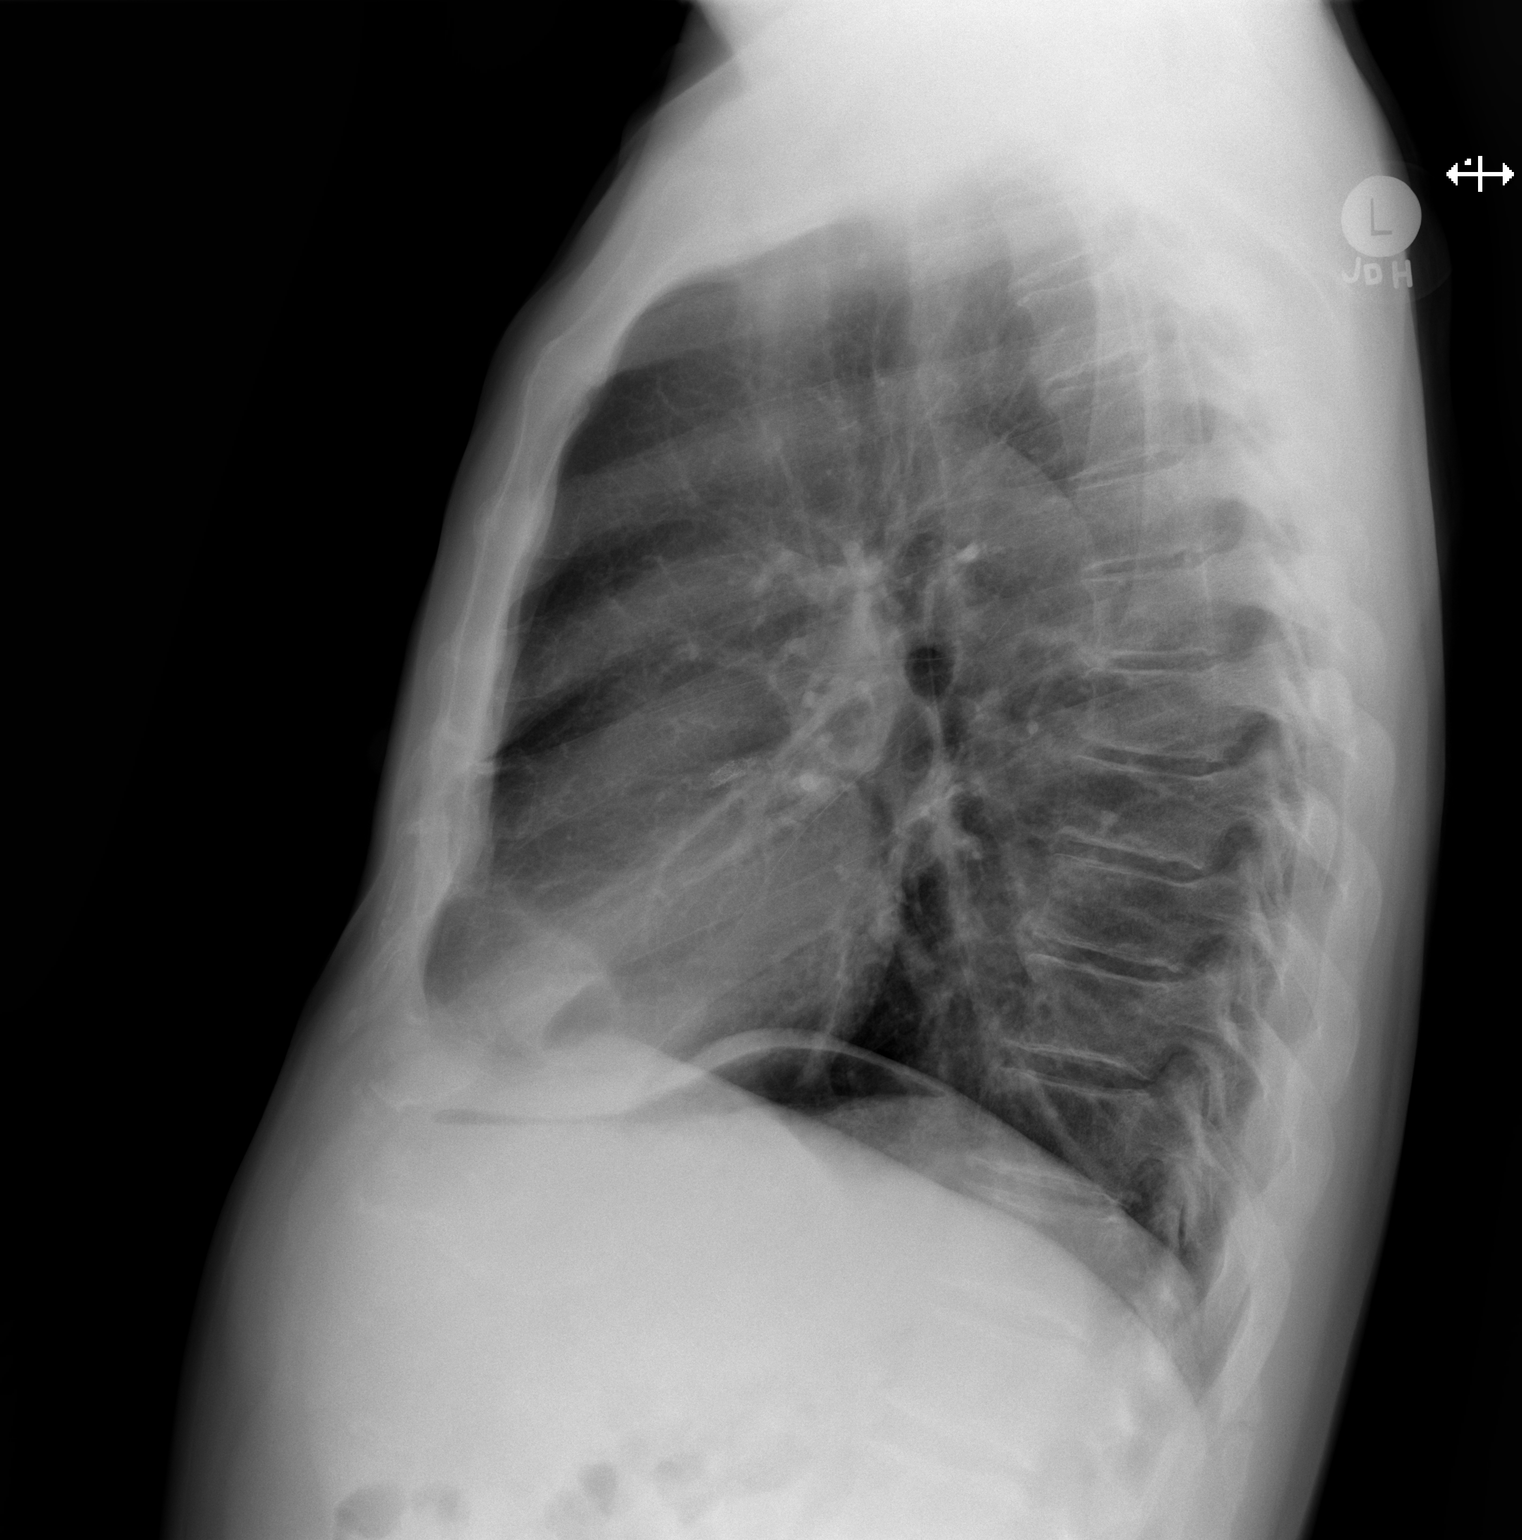

[2 of 2 positions shown; findings below may reference images not displayed]

FINDINGS: The lungs are adequately inflated and clear. Nipple shadows are
visible bilaterally. The heart and pulmonary vascularity are normal.
The mediastinum is normal in width. There is no pleural effusion.
The bony thorax is unremarkable.
IMPRESSION: There is no active cardiopulmonary disease.

## 2017-08-21 DIAGNOSIS — Z85820 Personal history of malignant melanoma of skin: Secondary | ICD-10-CM | POA: Diagnosis not present

## 2017-08-21 DIAGNOSIS — L814 Other melanin hyperpigmentation: Secondary | ICD-10-CM | POA: Diagnosis not present

## 2017-08-21 DIAGNOSIS — L821 Other seborrheic keratosis: Secondary | ICD-10-CM | POA: Diagnosis not present

## 2017-08-21 DIAGNOSIS — Z85828 Personal history of other malignant neoplasm of skin: Secondary | ICD-10-CM | POA: Diagnosis not present

## 2017-08-21 DIAGNOSIS — D225 Melanocytic nevi of trunk: Secondary | ICD-10-CM | POA: Diagnosis not present

## 2017-10-16 DIAGNOSIS — I10 Essential (primary) hypertension: Secondary | ICD-10-CM | POA: Diagnosis not present

## 2017-10-16 DIAGNOSIS — E7800 Pure hypercholesterolemia, unspecified: Secondary | ICD-10-CM | POA: Diagnosis not present

## 2017-10-16 DIAGNOSIS — I209 Angina pectoris, unspecified: Secondary | ICD-10-CM | POA: Diagnosis not present

## 2017-10-16 DIAGNOSIS — I25118 Atherosclerotic heart disease of native coronary artery with other forms of angina pectoris: Secondary | ICD-10-CM | POA: Diagnosis not present

## 2017-10-16 LAB — HEPATIC FUNCTION PANEL
ALK PHOS: 43 IU/L (ref 39–117)
ALT: 19 IU/L (ref 0–44)
AST: 19 IU/L (ref 0–40)
Albumin: 4.7 g/dL (ref 3.5–4.8)
BILIRUBIN, DIRECT: 0.16 mg/dL (ref 0.00–0.40)
Bilirubin Total: 0.5 mg/dL (ref 0.0–1.2)
Total Protein: 6.7 g/dL (ref 6.0–8.5)

## 2017-10-16 LAB — LIPID PANEL W/O CHOL/HDL RATIO
CHOLESTEROL TOTAL: 174 mg/dL (ref 100–199)
HDL: 57 mg/dL (ref >39)
LDL Calculated: 107 mg/dL — ABNORMAL HIGH (ref 0–99)
TRIGLYCERIDES: 51 mg/dL (ref 0–149)
VLDL Cholesterol Cal: 10 mg/dL (ref 5–40)

## 2017-10-22 ENCOUNTER — Other Ambulatory Visit: Payer: Self-pay

## 2017-10-22 DIAGNOSIS — L821 Other seborrheic keratosis: Secondary | ICD-10-CM | POA: Diagnosis not present

## 2017-10-22 DIAGNOSIS — L819 Disorder of pigmentation, unspecified: Secondary | ICD-10-CM | POA: Diagnosis not present

## 2017-10-22 DIAGNOSIS — L570 Actinic keratosis: Secondary | ICD-10-CM | POA: Diagnosis not present

## 2017-10-22 DIAGNOSIS — D485 Neoplasm of uncertain behavior of skin: Secondary | ICD-10-CM | POA: Diagnosis not present

## 2017-10-22 DIAGNOSIS — D0422 Carcinoma in situ of skin of left ear and external auricular canal: Secondary | ICD-10-CM | POA: Diagnosis not present

## 2017-10-30 DIAGNOSIS — Z136 Encounter for screening for cardiovascular disorders: Secondary | ICD-10-CM | POA: Diagnosis not present

## 2017-10-30 DIAGNOSIS — Z139 Encounter for screening, unspecified: Secondary | ICD-10-CM | POA: Diagnosis not present

## 2017-10-30 DIAGNOSIS — Z125 Encounter for screening for malignant neoplasm of prostate: Secondary | ICD-10-CM | POA: Diagnosis not present

## 2017-10-30 DIAGNOSIS — Z0000 Encounter for general adult medical examination without abnormal findings: Secondary | ICD-10-CM | POA: Diagnosis not present

## 2017-10-30 DIAGNOSIS — E785 Hyperlipidemia, unspecified: Secondary | ICD-10-CM | POA: Diagnosis not present

## 2017-10-30 DIAGNOSIS — Z9181 History of falling: Secondary | ICD-10-CM | POA: Diagnosis not present

## 2017-11-20 ENCOUNTER — Other Ambulatory Visit: Payer: Self-pay

## 2017-11-20 DIAGNOSIS — D485 Neoplasm of uncertain behavior of skin: Secondary | ICD-10-CM | POA: Diagnosis not present

## 2017-11-20 DIAGNOSIS — Z85828 Personal history of other malignant neoplasm of skin: Secondary | ICD-10-CM | POA: Diagnosis not present

## 2017-11-20 DIAGNOSIS — L905 Scar conditions and fibrosis of skin: Secondary | ICD-10-CM | POA: Diagnosis not present

## 2017-11-20 DIAGNOSIS — C44329 Squamous cell carcinoma of skin of other parts of face: Secondary | ICD-10-CM | POA: Diagnosis not present

## 2017-11-20 DIAGNOSIS — L570 Actinic keratosis: Secondary | ICD-10-CM | POA: Diagnosis not present

## 2017-11-20 DIAGNOSIS — C44319 Basal cell carcinoma of skin of other parts of face: Secondary | ICD-10-CM | POA: Diagnosis not present

## 2017-12-13 NOTE — Progress Notes (Signed)
Cardiology Office Note   Date:  12/17/2017   ID:  Kevin Stewart, DOB 10-19-1940, MRN [id number]  PCP:  [name], NP  Cardiologist:   Peter [name], MD   Chief Complaint  Patient presents with  . Follow-up    6 months;  . Chest Pain    rarely  . Dizziness    bending over, getting up quickly      History of Present Illness: Kevin Stewart is a 77 y.o. male who is seen for follow up CAD.  He presented in 2003 with a "light" heart attack and had stenting of the proximal LAD with a 3.5 x 18 mm Cypher stent in [facility]. He has been on chronic ASA and Plavix.  He has risk factors of HTN, hypercholesterolemia, and family history of CAD. In the spring of 2017 he presented with exertional angina.  He had an ETT that was abnormal and underwent cardiac cath on 12/20/15 demonstrating a 99% proximal LAD stenosis proximal to the prior stent. This was treated with DES. He had a follow up  ETT in June 2018 which showed a marked improvement. On his last visit Zetia was added for hypercholesterolemia. He did not tolerate this well and wanted to switch to Vytorin which he has tolerated in the past. When he took the 10/40 mg he continued to have symptoms and stopped it altogether. He is now on Crestor 5 mg daily and is doing OK with this but just barely. He still gets some chest pain if he overdoes it but this has been stable for several years. No dyspnea or palpitations. He stays very active.   Past Medical History:  Diagnosis Date  . Arthritis    "hands" (12/20/2015)  . Basal cell carcinoma, face   . Coronary artery disease    a. 2003 - light MI per patient; s/p Cypher DES to prox LAD in [facility].  Marland Kitchen Hx of adenomatous colonic polyps   . Hyperlipemia   . Hyperlipidemia   . Hypertension   . Kidney stones    "passed them"  . Melanoma of right side of neck (Kevin Stewart)    removed posterior neck 12/312013  . Migraine    "stopped in 2001 when I had my heart attack"  . Myocardial  infarction ([name])   . Pre-diabetes   . Wears dentures    upper-lower partial  . Wears glasses     Past Surgical History:  Procedure Laterality Date  . CARDIAC CATHETERIZATION N/A 12/20/2015   Procedure: Left Heart Cath and Coronary Angiography;  Surgeon: Belva Crome, MD;  Location: [place] [facility];  Service: Cardiovascular;  Laterality: N/A;  . CARDIAC CATHETERIZATION N/A 12/20/2015   Procedure: Coronary Stent Intervention;  Surgeon: Belva Crome, MD;  Location: [place] [facility];  Service: Cardiovascular;  Laterality: N/A;  . CATARACT EXTRACTION W/ INTRAOCULAR LENS  IMPLANT, BILATERAL Bilateral 2011  . COLONOSCOPY    . CORONARY ANGIOPLASTY WITH STENT PLACEMENT  2001   "1 stent"  . CORONARY ANGIOPLASTY WITH STENT PLACEMENT  12/20/2015   "1 stent"  . HEMORRHOID SURGERY     "burned them off"  . MELANOMA EXCISION WITH SENTINEL LYMPH NODE BIOPSY  07/21/2012   Procedure: MELANOMA EXCISION WITH SENTINEL LYMPH NODE BIOPSY;  Surgeon: Rozetta Nunnery, MD;  Location: [place];  Service: General;  Laterality: N/A;  melanoma excision with sentinel node biopsy   posterior right neck  . TONSILLECTOMY  1961  Current Outpatient Medications  Medication Sig Dispense Refill  . aspirin 81 MG tablet Take 81 mg by mouth daily.    . carvedilol (COREG) 12.5 MG tablet Take 12.5 mg by mouth 2 (two) times daily with a meal.    . clopidogrel (PLAVIX) 75 MG tablet Take 75 mg by mouth daily.    . Enalapril-Hydrochlorothiazide 5-12.5 MG tablet Take 1 tablet by mouth daily.    . fluticasone (FLONASE) 50 MCG/ACT nasal spray Place 1 spray into both nostrils as needed.  0  . naphazoline-pheniramine (EYE ALLERGY RELIEF) 0.025-0.3 % ophthalmic solution Place 2 drops into both eyes 2 (two) times daily.    . nitroGLYCERIN (NITROSTAT) 0.4 MG SL tablet Place 1 tablet (0.4 mg total) under the tongue every 5 (five) minutes as needed for chest pain (up to 3 doses). 25 tablet 3  . Omega-3 Fatty  Acids (OMEGA-3 IQ PO) Take 1 tablet by mouth daily.    . rosuvastatin (CRESTOR) 5 MG tablet Take 1 tablet (5 mg total) by mouth daily. 90 tablet 3   No current facility-administered medications for this visit.     Allergies:   Shrimp [shellfish allergy]    Social History:  The patient  reports that he quit smoking about 44 years ago. His smoking use included cigarettes. He has a 20.00 pack-year smoking history. He has quit using smokeless tobacco. His smokeless tobacco use included chew. He reports that he drinks alcohol. He reports that he does not use drugs.   Family History:  The patient's family history includes Cancer - Colon (age of onset: 18) in his brother; Cancer - Other (age of onset: 62) in his father; Cancer - Other (age of onset: 34) in his sister; Heart attack (age of onset: 6) in his mother; Heart attack (age of onset: 44) in his brother.    ROS:  Please see the history of present illness.     All other systems are reviewed and negative.    PHYSICAL EXAM: VS:  BP 132/73   Pulse 67   Ht _0  (1.803 m)   Wt 170 lb 3.2 oz (77.2 kg)   BMI 23.74 kg/m  , BMI Body mass index is 23.74 kg/m.   EKG:  EKG is ordered today. NSR rate 67. Normal Ecg. I have personally reviewed and interpreted this study.    Lab Results  Component Value Date   WBC 9.5 12/21/2015   HGB 13.8 12/21/2015   HCT 40.7 12/21/2015   PLT 209 12/21/2015   GLUCOSE 118 (H) 12/21/2015   CHOL 174 10/16/2017   TRIG 51 10/16/2017   HDL 57 10/16/2017   LDLCALC 107 (H) 10/16/2017   ALT 19 10/16/2017   AST 19 10/16/2017   NA 138 12/21/2015   K 3.9 12/21/2015   CL 104 12/21/2015   CREATININE 0.79 12/21/2015   BUN 11 12/21/2015   CO2 28 12/21/2015   INR 1.00 12/19/2015    Labs dated 06/06/16: cholesterol 181, triglycerides 48, HDL 62, LDL 109. A1c 5.5%. CMET normal. Dated 06/26/17: cholesterol 250, triglycerides 60, HDL 59, LDL 179. CMET normal.   Wt Readings from Last 3 Encounters:  12/17/17 170  lb 3.2 oz (77.2 kg)  07/02/17 172 lb (78 kg)  11/20/16 172 lb 3.2 oz (78.1 kg)      Other studies Reviewed: Additional studies/ records that were reviewed today include: Cardiac cath 12/20/15: Review of the above records demonstrates:  Conclusion   1. Prox RCA to Dist RCA lesion, 30%  stenosed. 2. Mid LAD lesion, 20% stenosed. The lesion was previously treated with a stent (unknown type). 3. Prox LAD to Mid LAD lesion, 90% stenosed. Post intervention, there is a 10% residual stenosis.    90% stenosis proximal to the previously placed LAD stent within the heavily calcified segment.  Dominant right coronary which wraps around the left ventricular apex. Minimal luminal irregularities are noted.  Patent circumflex and left main.  Successful percutaneous intervention with Cutting Balloon angioplasty of the proximal stenosis followed by drug-eluting stent implantation reducing the 90% stenosis to 10% with TIMI grade 3 flow. Postdilatation was to 3.75 mm at 16 atm.  Normal left ventricular systolic function with normal end-diastolic pressure.  Recommendations:   Continue Plavix and aspirin  Discharge tomorrow if no complications overnight.    ETT 12/25/16: Study Highlights     Blood pressure demonstrated a hypertensive response to exercise.  Horizontal ST segment depression ST segment depression of 1 mm was noted during stress in the II, III, aVF, V5 and V6 leads, and returning to baseline after less than 1 minute of recovery.   1. Excellent exercise tolerance.  2. Hypertensive BP response.  3. Equivocal changes on exercise ECG: about 1 mm horizontal ST depression in inferior leads and V5,V6 though difficult to tell for sure due to baseline artifact.  ST segments returned to baseline quickly in recovery.   Overall low risk study with excellent exercise tolerance but not normal.      ASSESSMENT AND PLAN:  1.  Coronary artery disease- native vessel with stable angina class  1-2. Remote stenting of the LAD in 2003 with a Cypher stent. S/p stenting of the proximal LAD in May 2017 with DES.  Now on DAPT with ASA and Plavix. On beta blocker and ACEi. Plan to continue Plavix indefinitely with first generation DES. Follow up ETT in June 2018 was low risk and much improved from prior.   2. Hypercholesterolemia. Intolerant of Vytorin and lipitor. Able to tolerate Crestor at 5 mg daily but is having some symptoms and I don't think he would tolerate a higher dose. LDL reduced to 107 but still not at goal. Need to relook at [facility] 9 inhibitor. His insurance has changed since last year. Will have pharmacy review.    3. HTN controlled.    Current medicines are reviewed at length with the patient today.  The patient does not have concerns regarding medicines.  The following changes have been made:  no change  Labs/ tests ordered today include:   No orders of the defined types were placed in this encounter.    Disposition:   Follow up in 6 months.  Signed, Peter [name], MD  12/17/2017 8:37 AM    [facility] [address], [place], [place], [address] Phone [phone], Fax [phone]

## 2017-12-17 ENCOUNTER — Encounter: Payer: Self-pay | Admitting: Cardiology

## 2017-12-17 ENCOUNTER — Ambulatory Visit (INDEPENDENT_AMBULATORY_CARE_PROVIDER_SITE_OTHER): Payer: Medicare Other | Admitting: Cardiology

## 2017-12-17 VITALS — BP 132/73 | HR 67 | Ht 71.0 in | Wt 170.2 lb

## 2017-12-17 DIAGNOSIS — E7800 Pure hypercholesterolemia, unspecified: Secondary | ICD-10-CM

## 2017-12-17 DIAGNOSIS — I10 Essential (primary) hypertension: Secondary | ICD-10-CM

## 2017-12-17 DIAGNOSIS — I25118 Atherosclerotic heart disease of native coronary artery with other forms of angina pectoris: Secondary | ICD-10-CM | POA: Diagnosis not present

## 2017-12-17 NOTE — Patient Instructions (Signed)
We will look into the expense of one of the new cholesterol drugs.  For now continue your current therapy  I will see you in 6 months.

## 2017-12-24 ENCOUNTER — Encounter: Payer: Self-pay | Admitting: Cardiology

## 2017-12-24 DIAGNOSIS — I2510 Atherosclerotic heart disease of native coronary artery without angina pectoris: Secondary | ICD-10-CM | POA: Diagnosis not present

## 2017-12-24 DIAGNOSIS — I10 Essential (primary) hypertension: Secondary | ICD-10-CM | POA: Diagnosis not present

## 2017-12-24 DIAGNOSIS — E785 Hyperlipidemia, unspecified: Secondary | ICD-10-CM | POA: Diagnosis not present

## 2017-12-24 DIAGNOSIS — I25118 Atherosclerotic heart disease of native coronary artery with other forms of angina pectoris: Secondary | ICD-10-CM | POA: Diagnosis not present

## 2017-12-26 ENCOUNTER — Telehealth: Payer: Self-pay | Admitting: Pharmacist Clinician (PhC)/ Clinical Pharmacy Specialist

## 2017-12-26 MED ORDER — EVOLOCUMAB 140 MG/ML ~~LOC~~ SOAJ: 140.0000 mg | SUBCUTANEOUS | 12 refills | Status: DC

## 2017-12-26 NOTE — Telephone Encounter (Signed)
Repatha approved "until further notice"  Sent to [facility]

## 2017-12-30 ENCOUNTER — Other Ambulatory Visit: Payer: Self-pay

## 2017-12-30 DIAGNOSIS — D0422 Carcinoma in situ of skin of left ear and external auricular canal: Secondary | ICD-10-CM | POA: Diagnosis not present

## 2017-12-30 DIAGNOSIS — L570 Actinic keratosis: Secondary | ICD-10-CM | POA: Diagnosis not present

## 2017-12-30 DIAGNOSIS — C44219 Basal cell carcinoma of skin of left ear and external auricular canal: Secondary | ICD-10-CM | POA: Diagnosis not present

## 2018-02-18 DIAGNOSIS — L578 Other skin changes due to chronic exposure to nonionizing radiation: Secondary | ICD-10-CM | POA: Diagnosis not present

## 2018-02-18 DIAGNOSIS — D1801 Hemangioma of skin and subcutaneous tissue: Secondary | ICD-10-CM | POA: Diagnosis not present

## 2018-02-18 DIAGNOSIS — Z85820 Personal history of malignant melanoma of skin: Secondary | ICD-10-CM | POA: Diagnosis not present

## 2018-02-18 DIAGNOSIS — D229 Melanocytic nevi, unspecified: Secondary | ICD-10-CM | POA: Diagnosis not present

## 2018-02-18 DIAGNOSIS — L570 Actinic keratosis: Secondary | ICD-10-CM | POA: Diagnosis not present

## 2018-02-18 DIAGNOSIS — L821 Other seborrheic keratosis: Secondary | ICD-10-CM | POA: Diagnosis not present

## 2018-03-25 DIAGNOSIS — H02055 Trichiasis without entropian left lower eyelid: Secondary | ICD-10-CM | POA: Diagnosis not present

## 2018-03-25 DIAGNOSIS — H47323 Drusen of optic disc, bilateral: Secondary | ICD-10-CM | POA: Diagnosis not present

## 2018-05-21 DIAGNOSIS — Z23 Encounter for immunization: Secondary | ICD-10-CM | POA: Diagnosis not present

## 2018-06-04 ENCOUNTER — Encounter: Payer: Self-pay | Admitting: Cardiology

## 2018-06-30 NOTE — Progress Notes (Signed)
Cardiology Office Note   Date:  07/02/2018   ID:  Kevin Stewart, DOB 01-13-1941, MRN [id number]  PCP:  [name], NP  Cardiologist:   Birdell Frasier [name], MD   Chief Complaint  Patient presents with  . Coronary Artery Disease      History of Present Illness: Kevin Stewart is a 77 y.o. male who is seen for follow up CAD.  He presented in 2003 with a "light" heart attack and had stenting of the proximal LAD with a 3.5 x 18 mm Cypher stent in [facility]. He has been on chronic ASA and Plavix.  He has risk factors of HTN, hypercholesterolemia, and family history of CAD. In the spring of 2017 he presented with exertional angina.  He had an ETT that was abnormal and underwent cardiac cath on 12/20/15 demonstrating a 99% proximal LAD stenosis proximal to the prior stent. This was treated with DES. He had a follow up  ETT in June 2018 which showed a marked improvement. Zetia was added for hypercholesterolemia. He did not tolerate this well and wanted to switch to Vytorin which he has tolerated in the past. When he took the 10/40 mg he continued to have symptoms and stopped it altogether. He was then on Crestor 5 mg daily.   On follow up today he has stable angina symptoms. He still gets some chest pain if he overdoes with lifting but this is unchanged from prior.  No dyspnea or palpitations. He stays very active and is still working.    Past Medical History:  Diagnosis Date  . Arthritis    "hands" (12/20/2015)  . Basal cell carcinoma, face   . Coronary artery disease    a. 2003 - light MI per patient; s/p Cypher DES to prox LAD in [facility].  Marland Kitchen Hx of adenomatous colonic polyps   . Hyperlipemia   . Hyperlipidemia   . Hypertension   . Kidney stones    "passed them"  . Melanoma of right side of neck ([place])    removed posterior neck 12/312013  . Migraine    "stopped in 2001 when I had my heart attack"  . Myocardial infarction ([place])   . Pre-diabetes   . Wears dentures     upper-lower partial  . Wears glasses     Past Surgical History:  Procedure Laterality Date  . CARDIAC CATHETERIZATION N/A 12/20/2015   Procedure: Left Heart Cath and Coronary Angiography;  Surgeon: Belva Crome, MD;  Location: [place] [facility];  Service: Cardiovascular;  Laterality: N/A;  . CARDIAC CATHETERIZATION N/A 12/20/2015   Procedure: Coronary Stent Intervention;  Surgeon: Belva Crome, MD;  Location: [place] [facility];  Service: Cardiovascular;  Laterality: N/A;  . CATARACT EXTRACTION W/ INTRAOCULAR LENS  IMPLANT, BILATERAL Bilateral 2011  . COLONOSCOPY    . CORONARY ANGIOPLASTY WITH STENT PLACEMENT  2001   "1 stent"  . CORONARY ANGIOPLASTY WITH STENT PLACEMENT  12/20/2015   "1 stent"  . HEMORRHOID SURGERY     "burned them off"  . MELANOMA EXCISION WITH SENTINEL LYMPH NODE BIOPSY  07/21/2012   Procedure: MELANOMA EXCISION WITH SENTINEL LYMPH NODE BIOPSY;  Surgeon: Rozetta Nunnery, MD;  Location: [place];  Service: General;  Laterality: N/A;  melanoma excision with sentinel node biopsy   posterior right neck  . TONSILLECTOMY  1961     Current Outpatient Medications  Medication Sig Dispense Refill  . aspirin 81 MG tablet Take  81 mg by mouth daily.    . carvedilol (COREG) 12.5 MG tablet Take 12.5 mg by mouth 2 (two) times daily with a meal.    . clopidogrel (PLAVIX) 75 MG tablet Take 75 mg by mouth daily.    . Enalapril-Hydrochlorothiazide 5-12.5 MG tablet Take 1 tablet by mouth daily.    . fluticasone (FLONASE) 50 MCG/ACT nasal spray Place 1 spray into both nostrils as needed.  0  . naphazoline-pheniramine (EYE ALLERGY RELIEF) 0.025-0.3 % ophthalmic solution Place 2 drops into both eyes 2 (two) times daily.    . nitroGLYCERIN (NITROSTAT) 0.4 MG SL tablet Place 1 tablet (0.4 mg total) under the tongue every 5 (five) minutes as needed for chest pain (up to 3 doses). 25 tablet 3  . Omega-3 Fatty Acids (OMEGA-3 IQ PO) Take 1 tablet by mouth daily.    .  rosuvastatin (CRESTOR) 5 MG tablet Take 1 tablet (5 mg total) by mouth daily. 90 tablet 3   No current facility-administered medications for this visit.     Allergies:   Shrimp [shellfish allergy]    Social History:  The patient  reports that he quit smoking about 45 years ago. His smoking use included cigarettes. He has a 20.00 pack-year smoking history. He has quit using smokeless tobacco.  His smokeless tobacco use included chew. He reports current alcohol use. He reports that he does not use drugs.   Family History:  The patient's family history includes Cancer - Colon (age of onset: 16) in his brother; Cancer - Other (age of onset: 22) in his father; Cancer - Other (age of onset: 72) in his sister; Heart attack (age of onset: 71) in his mother; Heart attack (age of onset: 34) in his brother.    ROS:  Please see the history of present illness.     All other systems are reviewed and negative.    PHYSICAL EXAM: VS:  BP (!) 142/60   Pulse 64   Ht _0  (1.803 m)   Wt 173 lb 6.4 oz (78.7 kg)   BMI 24.18 kg/m  , BMI Body mass index is 24.18 kg/m.   EKG:  EKG is not ordered today.     Lab Results  Component Value Date   WBC 9.5 12/21/2015   HGB 13.8 12/21/2015   HCT 40.7 12/21/2015   PLT 209 12/21/2015   GLUCOSE 118 (H) 12/21/2015   CHOL 174 10/16/2017   TRIG 51 10/16/2017   HDL 57 10/16/2017   LDLCALC 107 (H) 10/16/2017   ALT 19 10/16/2017   AST 19 10/16/2017   NA 138 12/21/2015   K 3.9 12/21/2015   CL 104 12/21/2015   CREATININE 0.79 12/21/2015   BUN 11 12/21/2015   CO2 28 12/21/2015   INR 1.00 12/19/2015    Labs dated 06/06/16: cholesterol 181, triglycerides 48, HDL 62, LDL 109. A1c 5.5%. CMET normal. Dated 06/26/17: cholesterol 250, triglycerides 60, HDL 59, LDL 179. CMET normal.  Dated 12/24/17: cholesterol 172, triglycerides 55, HDL 60, LDL 101. Glucose 111. Otherwise CMET normal.   Wt Readings from Last 3 Encounters:  07/02/18 173 lb 6.4 oz (78.7 kg)    12/17/17 170 lb 3.2 oz (77.2 kg)  07/02/17 172 lb (78 kg)      Other studies Reviewed: Additional studies/ records that were reviewed today include: Cardiac cath 12/20/15: Review of the above records demonstrates:  Conclusion   1. Prox RCA to Dist RCA lesion, 30% stenosed. 2. Mid LAD lesion, 20% stenosed. The  lesion was previously treated with a stent (unknown type). 3. Prox LAD to Mid LAD lesion, 90% stenosed. Post intervention, there is a 10% residual stenosis.    90% stenosis proximal to the previously placed LAD stent within the heavily calcified segment.  Dominant right coronary which wraps around the left ventricular apex. Minimal luminal irregularities are noted.  Patent circumflex and left main.  Successful percutaneous intervention with Cutting Balloon angioplasty of the proximal stenosis followed by drug-eluting stent implantation reducing the 90% stenosis to 10% with TIMI grade 3 flow. Postdilatation was to 3.75 mm at 16 atm.  Normal left ventricular systolic function with normal end-diastolic pressure.  Recommendations:   Continue Plavix and aspirin  Discharge tomorrow if no complications overnight.    ETT 12/25/16: Study Highlights     Blood pressure demonstrated a hypertensive response to exercise.  Horizontal ST segment depression ST segment depression of 1 mm was noted during stress in the II, III, aVF, V5 and V6 leads, and returning to baseline after less than 1 minute of recovery.   1. Excellent exercise tolerance.  2. Hypertensive BP response.  3. Equivocal changes on exercise ECG: about 1 mm horizontal ST depression in inferior leads and V5,V6 though difficult to tell for sure due to baseline artifact.  ST segments returned to baseline quickly in recovery.   Overall low risk study with excellent exercise tolerance but not normal.      ASSESSMENT AND PLAN:  1.  Coronary artery disease- native vessel with stable angina class 1-2. Remote  stenting of the LAD in 2003 with a Cypher stent. S/p stenting of the proximal LAD in May 2017 with DES.  Now on DAPT with ASA and Plavix. On beta blocker and ACEi. Plan to continue Plavix indefinitely with first generation DES. Follow up ETT in June 2018 was low risk and much improved from prior. He has stable class 1-2 angina.  2. Hypercholesterolemia. Intolerant of Vytorin and lipitor. Able to tolerate Crestor at 5 mg daily but does have some aches. Was "approved" for Repatha but patient states it was going to cost $400 so he did not fill. Had lab work at primary care yesterday so we will request a copy.     3. HTN controlled.    Current medicines are reviewed at length with the patient today.  The patient does not have concerns regarding medicines.  The following changes have been made:  no change  Labs/ tests ordered today include:   No orders of the defined types were placed in this encounter.    Disposition:   Follow up in 6 months.  Signed, Kevin Vallie [name], MD  07/02/2018 2:53 PM    [facility] [address], [place], [place], [address] Phone [phone], Fax [phone]

## 2018-07-01 DIAGNOSIS — I10 Essential (primary) hypertension: Secondary | ICD-10-CM | POA: Diagnosis not present

## 2018-07-01 DIAGNOSIS — I25118 Atherosclerotic heart disease of native coronary artery with other forms of angina pectoris: Secondary | ICD-10-CM | POA: Diagnosis not present

## 2018-07-01 DIAGNOSIS — I2510 Atherosclerotic heart disease of native coronary artery without angina pectoris: Secondary | ICD-10-CM | POA: Diagnosis not present

## 2018-07-01 DIAGNOSIS — E785 Hyperlipidemia, unspecified: Secondary | ICD-10-CM | POA: Diagnosis not present

## 2018-07-02 ENCOUNTER — Ambulatory Visit (INDEPENDENT_AMBULATORY_CARE_PROVIDER_SITE_OTHER): Payer: Medicare Other | Admitting: Cardiology

## 2018-07-02 ENCOUNTER — Encounter: Payer: Self-pay | Admitting: Cardiology

## 2018-07-02 VITALS — BP 142/60 | HR 64 | Ht 71.0 in | Wt 173.4 lb

## 2018-07-02 DIAGNOSIS — E7800 Pure hypercholesterolemia, unspecified: Secondary | ICD-10-CM

## 2018-07-02 DIAGNOSIS — I10 Essential (primary) hypertension: Secondary | ICD-10-CM

## 2018-07-02 DIAGNOSIS — I25118 Atherosclerotic heart disease of native coronary artery with other forms of angina pectoris: Secondary | ICD-10-CM | POA: Diagnosis not present

## 2018-08-04 ENCOUNTER — Other Ambulatory Visit: Payer: Self-pay | Admitting: *Deleted

## 2018-08-04 MED ORDER — ROSUVASTATIN CALCIUM 5 MG PO TABS: 5.0000 mg | ORAL_TABLET | Freq: Every day | ORAL | 3 refills | Status: DC

## 2018-08-19 DIAGNOSIS — L821 Other seborrheic keratosis: Secondary | ICD-10-CM | POA: Diagnosis not present

## 2018-08-19 DIAGNOSIS — L218 Other seborrheic dermatitis: Secondary | ICD-10-CM | POA: Diagnosis not present

## 2018-08-19 DIAGNOSIS — L819 Disorder of pigmentation, unspecified: Secondary | ICD-10-CM | POA: Diagnosis not present

## 2018-08-19 DIAGNOSIS — L570 Actinic keratosis: Secondary | ICD-10-CM | POA: Diagnosis not present

## 2018-08-19 DIAGNOSIS — Z85828 Personal history of other malignant neoplasm of skin: Secondary | ICD-10-CM | POA: Diagnosis not present

## 2018-08-19 DIAGNOSIS — D229 Melanocytic nevi, unspecified: Secondary | ICD-10-CM | POA: Diagnosis not present

## 2018-08-19 DIAGNOSIS — D1801 Hemangioma of skin and subcutaneous tissue: Secondary | ICD-10-CM | POA: Diagnosis not present

## 2018-08-19 DIAGNOSIS — L814 Other melanin hyperpigmentation: Secondary | ICD-10-CM | POA: Diagnosis not present

## 2018-11-05 DIAGNOSIS — Z0000 Encounter for general adult medical examination without abnormal findings: Secondary | ICD-10-CM | POA: Diagnosis not present

## 2018-11-05 DIAGNOSIS — Z1211 Encounter for screening for malignant neoplasm of colon: Secondary | ICD-10-CM | POA: Diagnosis not present

## 2018-11-05 DIAGNOSIS — E785 Hyperlipidemia, unspecified: Secondary | ICD-10-CM | POA: Diagnosis not present

## 2018-11-05 DIAGNOSIS — Z125 Encounter for screening for malignant neoplasm of prostate: Secondary | ICD-10-CM | POA: Diagnosis not present

## 2018-11-05 DIAGNOSIS — Z1331 Encounter for screening for depression: Secondary | ICD-10-CM | POA: Diagnosis not present

## 2018-11-05 DIAGNOSIS — Z9181 History of falling: Secondary | ICD-10-CM | POA: Diagnosis not present

## 2018-12-09 DIAGNOSIS — D485 Neoplasm of uncertain behavior of skin: Secondary | ICD-10-CM | POA: Diagnosis not present

## 2018-12-09 DIAGNOSIS — L570 Actinic keratosis: Secondary | ICD-10-CM | POA: Diagnosis not present

## 2018-12-09 DIAGNOSIS — C44319 Basal cell carcinoma of skin of other parts of face: Secondary | ICD-10-CM | POA: Diagnosis not present

## 2018-12-09 DIAGNOSIS — C44212 Basal cell carcinoma of skin of right ear and external auricular canal: Secondary | ICD-10-CM | POA: Diagnosis not present

## 2018-12-09 DIAGNOSIS — C44311 Basal cell carcinoma of skin of nose: Secondary | ICD-10-CM | POA: Diagnosis not present

## 2019-01-27 DIAGNOSIS — Z139 Encounter for screening, unspecified: Secondary | ICD-10-CM | POA: Diagnosis not present

## 2019-01-27 DIAGNOSIS — I2510 Atherosclerotic heart disease of native coronary artery without angina pectoris: Secondary | ICD-10-CM | POA: Diagnosis not present

## 2019-01-27 DIAGNOSIS — E785 Hyperlipidemia, unspecified: Secondary | ICD-10-CM | POA: Diagnosis not present

## 2019-01-27 DIAGNOSIS — I10 Essential (primary) hypertension: Secondary | ICD-10-CM | POA: Diagnosis not present

## 2019-01-27 DIAGNOSIS — I25118 Atherosclerotic heart disease of native coronary artery with other forms of angina pectoris: Secondary | ICD-10-CM | POA: Diagnosis not present

## 2019-02-24 DIAGNOSIS — L570 Actinic keratosis: Secondary | ICD-10-CM | POA: Diagnosis not present

## 2019-02-24 DIAGNOSIS — L814 Other melanin hyperpigmentation: Secondary | ICD-10-CM | POA: Diagnosis not present

## 2019-02-24 DIAGNOSIS — L821 Other seborrheic keratosis: Secondary | ICD-10-CM | POA: Diagnosis not present

## 2019-02-24 DIAGNOSIS — Z85828 Personal history of other malignant neoplasm of skin: Secondary | ICD-10-CM | POA: Diagnosis not present

## 2019-02-24 DIAGNOSIS — Z85820 Personal history of malignant melanoma of skin: Secondary | ICD-10-CM | POA: Diagnosis not present

## 2019-02-24 DIAGNOSIS — D229 Melanocytic nevi, unspecified: Secondary | ICD-10-CM | POA: Diagnosis not present

## 2019-02-24 DIAGNOSIS — D1801 Hemangioma of skin and subcutaneous tissue: Secondary | ICD-10-CM | POA: Diagnosis not present

## 2019-03-09 DIAGNOSIS — C4441 Basal cell carcinoma of skin of scalp and neck: Secondary | ICD-10-CM | POA: Diagnosis not present

## 2019-03-23 DIAGNOSIS — C44311 Basal cell carcinoma of skin of nose: Secondary | ICD-10-CM | POA: Diagnosis not present

## 2019-04-05 NOTE — Progress Notes (Signed)
Virtual Visit via Telephone Note   This visit type was conducted due to national recommendations for restrictions regarding the COVID-19 Pandemic (e.g. social distancing) in an effort to limit this patient's exposure and mitigate transmission in our community.  Due to his co-morbid illnesses, this patient is at least at moderate risk for complications without adequate follow up.  This format is felt to be most appropriate for this patient at this time.  The patient did not have access to video technology/had technical difficulties with video requiring transitioning to audio format only (telephone).  All issues noted in this document were discussed and addressed.  No physical exam could be performed with this format.  Please refer to the patient's chart for his  consent to telehealth for [facility].   Date:  04/05/2019   ID:  Kevin Stewart, DOB February 21, 1941, MRN [id number]  Patient Location: Home Provider Location: Home  PCP:  [name], NP  Cardiologist:  Kinzie Wickes [name] MD Electrophysiologist:  None   Evaluation Performed:  Follow-Up Visit  Chief Complaint:  CAD  History of Present Illness:    Kevin Stewart is a 78 y.o. male with history of CAD.  He presented in 2003 with a "light" heart attack and had stenting of the proximal LAD with a 3.5 x 18 mm Cypher stent in [facility]. He has been on chronic ASA and Plavix.  He has risk factors of HTN, hypercholesterolemia, and family history of CAD. In the spring of 2017 he presented with exertional angina.  He had an ETT that was abnormal and underwent cardiac cath on 12/20/15 demonstrating a 99% proximal LAD stenosis proximal to the prior stent. This was treated with DES. He had a follow up  ETT in June 2018 which showed a marked improvement. Zetia was added for hypercholesterolemia. He did not tolerate this well and wanted to switch to Vytorin which he has tolerated in the past. When he took the 10/40 mg he continued to have symptoms  and stopped it altogether. He was then on Crestor 5 mg daily.   On follow up today he is doing well. The only change in his health is that he had some skin cancers removed. No significant chest pain or dyspnea. Still working part time. Tolerating low dose Crestor ok.   The patient does not have symptoms concerning for COVID-19 infection (fever, chills, cough, or new shortness of breath).    Past Medical History:  Diagnosis Date   Arthritis    "hands" (12/20/2015)   Basal cell carcinoma, face    Coronary artery disease    a. 2003 - light MI per patient; s/p Cypher DES to prox LAD in [facility].   Hx of adenomatous colonic polyps    Hyperlipemia    Hyperlipidemia    Hypertension    Kidney stones    "passed them"   Melanoma of right side of neck ([place])    removed posterior neck 12/312013   Migraine    "stopped in 2001 when I had my heart attack"   Myocardial infarction [facility])    Pre-diabetes    Wears dentures    upper-lower partial   Wears glasses    Past Surgical History:  Procedure Laterality Date   CARDIAC CATHETERIZATION N/A 12/20/2015   Procedure: Left Heart Cath and Coronary Angiography;  Surgeon: Belva Crome, MD;  Location: [place] CV LAB;  Service: Cardiovascular;  Laterality: N/A;   CARDIAC CATHETERIZATION N/A 12/20/2015   Procedure: Coronary  Stent Intervention;  Surgeon: Belva Crome, MD;  Location: [place] CV LAB;  Service: Cardiovascular;  Laterality: N/A;   CATARACT EXTRACTION W/ INTRAOCULAR LENS  IMPLANT, BILATERAL Bilateral 2011   COLONOSCOPY     CORONARY ANGIOPLASTY WITH STENT PLACEMENT  2001   "1 stent"   CORONARY ANGIOPLASTY WITH STENT PLACEMENT  12/20/2015   "1 stent"   HEMORRHOID SURGERY     "burned them off"   MELANOMA EXCISION WITH SENTINEL LYMPH NODE BIOPSY  07/21/2012   Procedure: MELANOMA EXCISION WITH SENTINEL LYMPH NODE BIOPSY;  Surgeon: Rozetta Nunnery, MD;  Location: [place];  Service: General;   Laterality: N/A;  melanoma excision with sentinel node biopsy   posterior right neck   TONSILLECTOMY  1961     No outpatient medications have been marked as taking for the 04/08/19 encounter (Appointment) with [name], Christia Domke M, MD.     Allergies:   Shrimp [shellfish allergy]   Social History   Tobacco Use   Smoking status: Former Smoker    Packs/day: 2.00    Years: 10.00    Pack years: 20.00    Types: Cigarettes    Quit date: 07/10/1973    Years since quitting: 45.7   Smokeless tobacco: Former Systems developer    Types: Chew   Tobacco comment: "chewed a little; stopped in 06/1973"  Substance Use Topics   Alcohol use: Yes    Comment: 12/20/2015 'quit in ~ 2012-2013"   Drug use: No     Family Hx: The patient's family history includes Cancer - Colon (age of onset: 80) in his brother; Cancer - Other (age of onset: 70) in his father; Cancer - Other (age of onset: 60) in his sister; Heart attack (age of onset: 51) in his mother; Heart attack (age of onset: 28) in his brother.  ROS:   Please see the history of present illness.    All other systems reviewed and are negative.   Prior CV studies:   The following studies were reviewed today:  none  Labs/Other Tests and Data Reviewed:    EKG:  No ECG reviewed.  Recent Labs: No results found for requested labs within last 8760 hours.   Recent Lipid Panel Lab Results  Component Value Date/Time   CHOL 174 10/16/2017 08:22 AM   TRIG 51 10/16/2017 08:22 AM   HDL 57 10/16/2017 08:22 AM   LDLCALC 107 (H) 10/16/2017 08:22 AM   Dated 07/01/18: cholesterol 203, triglycerides 54, HDL 61, LDL 131. CMET normal  Wt Readings from Last 3 Encounters:  07/02/18 173 lb 6.4 oz (78.7 kg)  12/17/17 170 lb 3.2 oz (77.2 kg)  07/02/17 172 lb (78 kg)     Objective:    Vital Signs:  There were no vitals taken for this visit.   VITAL SIGNS:  reviewed  ASSESSMENT & PLAN:    1.  Coronary artery disease- native vessel with stable angina class 1.  Remote stenting of the LAD in 2003 with a Cypher stent. S/p stenting of the proximal LAD in May 2017 with DES.  Now on DAPT with ASA and Plavix. On beta blocker and ACEi. Plan to continue Plavix indefinitely with first generation DES. Follow up ETT in June 2018 was low risk and much improved from prior.   2. Hypercholesterolemia. Intolerant of Vytorin and lipitor. Able to tolerate Crestor at 5 mg daily but does have some aches. Was "approved" for Repatha but patient states it was going to cost $400 so he  did not fill. Will update lab work  3. HTN controlled.   COVID-19 Education: The signs and symptoms of COVID-19 were discussed with the patient and how to seek care for testing (follow up with PCP or arrange E-visit).  The importance of social distancing was discussed today.  Time:   Today, I have spent 10 minutes with the patient with telehealth technology discussing the above problems.     Medication Adjustments/Labs and Tests Ordered: Current medicines are reviewed at length with the patient today.  Concerns regarding medicines are outlined above.   Tests Ordered: No orders of the defined types were placed in this encounter.   Medication Changes: No orders of the defined types were placed in this encounter.   Follow Up:  In Person in 1 year(s)  Signed, Jette Lewan [name], MD  04/05/2019 10:20 AM    [place]

## 2019-04-08 ENCOUNTER — Telehealth (INDEPENDENT_AMBULATORY_CARE_PROVIDER_SITE_OTHER): Payer: Medicare Other | Admitting: Cardiology

## 2019-04-08 ENCOUNTER — Encounter: Payer: Self-pay | Admitting: Cardiology

## 2019-04-08 VITALS — BP 140/72 | HR 65 | Temp 97.3°F | Ht 71.0 in | Wt 165.2 lb

## 2019-04-08 DIAGNOSIS — I10 Essential (primary) hypertension: Secondary | ICD-10-CM

## 2019-04-08 DIAGNOSIS — I25118 Atherosclerotic heart disease of native coronary artery with other forms of angina pectoris: Secondary | ICD-10-CM | POA: Diagnosis not present

## 2019-04-08 DIAGNOSIS — E7800 Pure hypercholesterolemia, unspecified: Secondary | ICD-10-CM

## 2019-04-08 NOTE — Addendum Note (Signed)
Addended by: Kathyrn Lass on: 04/08/2019 10:05 AM   Modules accepted: Orders

## 2019-04-08 NOTE — Patient Instructions (Signed)
Medication Instructions:  Continue same medications If you need a refill on your cardiac medications before your next appointment, please call your pharmacy.   Lab work: Lab:  bmet,lipid and hepatic panels,cbc to be done next week No appointment needed Lab orders enclosed   Testing/Procedures: None ordered  Follow-Up: At [facility], you and your health needs are our priority.  As part of our continuing mission to provide you with exceptional heart care, we have created designated Provider Care Teams.  These Care Teams include your primary Cardiologist (physician) and Advanced Practice Providers (APPs -  Physician Assistants and Nurse Practitioners) who all work together to provide you with the care you need, when you need it. . Schedule follow up appointment in 1 year   Call in July to schedule Sept appointment

## 2019-04-09 NOTE — Addendum Note (Signed)
Addended by: Kathyrn Lass on: 04/09/2019 08:55 AM   Modules accepted: Orders

## 2019-04-15 DIAGNOSIS — I10 Essential (primary) hypertension: Secondary | ICD-10-CM | POA: Diagnosis not present

## 2019-04-15 DIAGNOSIS — E7800 Pure hypercholesterolemia, unspecified: Secondary | ICD-10-CM | POA: Diagnosis not present

## 2019-04-15 DIAGNOSIS — I25118 Atherosclerotic heart disease of native coronary artery with other forms of angina pectoris: Secondary | ICD-10-CM | POA: Diagnosis not present

## 2019-04-15 LAB — CBC WITH DIFFERENTIAL/PLATELET
Basophils Absolute: 0 10*3/uL (ref 0.0–0.2)
Basos: 1 %
EOS (ABSOLUTE): 0.1 10*3/uL (ref 0.0–0.4)
Eos: 2 %
Hematocrit: 42.9 % (ref 37.5–51.0)
Hemoglobin: 14.6 g/dL (ref 13.0–17.7)
Immature Grans (Abs): 0 10*3/uL (ref 0.0–0.1)
Immature Granulocytes: 0 %
Lymphocytes Absolute: 1.8 10*3/uL (ref 0.7–3.1)
Lymphs: 35 %
MCH: 30.2 pg (ref 26.6–33.0)
MCHC: 34 g/dL (ref 31.5–35.7)
MCV: 89 fL (ref 79–97)
Monocytes Absolute: 0.3 10*3/uL (ref 0.1–0.9)
Monocytes: 6 %
Neutrophils Absolute: 2.9 10*3/uL (ref 1.4–7.0)
Neutrophils: 56 %
Platelets: 224 10*3/uL (ref 150–450)
RBC: 4.84 x10E6/uL (ref 4.14–5.80)
RDW: 12 % (ref 11.6–15.4)
WBC: 5.2 10*3/uL (ref 3.4–10.8)

## 2019-04-15 LAB — BASIC METABOLIC PANEL
BUN/Creatinine Ratio: 19 (ref 10–24)
BUN: 18 mg/dL (ref 8–27)
CO2: 25 mmol/L (ref 20–29)
Calcium: 10 mg/dL (ref 8.6–10.2)
Chloride: 101 mmol/L (ref 96–106)
Creatinine, Ser: 0.97 mg/dL (ref 0.76–1.27)
GFR calc Af Amer: 87 mL/min/{1.73_m2} (ref >59)
GFR calc non Af Amer: 75 mL/min/{1.73_m2} (ref >59)
Glucose: 121 mg/dL — ABNORMAL HIGH (ref 65–99)
Potassium: 4.4 mmol/L (ref 3.5–5.2)
Sodium: 139 mmol/L (ref 134–144)

## 2019-04-15 LAB — HEPATIC FUNCTION PANEL
ALT: 20 IU/L (ref 0–44)
AST: 21 IU/L (ref 0–40)
Albumin: 4.8 g/dL — ABNORMAL HIGH (ref 3.7–4.7)
Alkaline Phosphatase: 49 IU/L (ref 39–117)
Bilirubin Total: 0.6 mg/dL (ref 0.0–1.2)
Bilirubin, Direct: 0.15 mg/dL (ref 0.00–0.40)
Total Protein: 7 g/dL (ref 6.0–8.5)

## 2019-04-15 LAB — LIPID PANEL
Chol/HDL Ratio: 2.8 ratio (ref 0.0–5.0)
Cholesterol, Total: 196 mg/dL (ref 100–199)
HDL: 69 mg/dL (ref >39)
LDL Chol Calc (NIH): 116 mg/dL — ABNORMAL HIGH (ref 0–99)
Triglycerides: 61 mg/dL (ref 0–149)
VLDL Cholesterol Cal: 11 mg/dL (ref 5–40)

## 2019-04-16 ENCOUNTER — Encounter: Payer: Self-pay | Admitting: *Deleted

## 2019-05-05 DIAGNOSIS — Z23 Encounter for immunization: Secondary | ICD-10-CM | POA: Diagnosis not present

## 2019-05-24 DIAGNOSIS — L570 Actinic keratosis: Secondary | ICD-10-CM | POA: Diagnosis not present

## 2019-06-24 DIAGNOSIS — L570 Actinic keratosis: Secondary | ICD-10-CM | POA: Diagnosis not present

## 2019-06-24 DIAGNOSIS — L821 Other seborrheic keratosis: Secondary | ICD-10-CM | POA: Diagnosis not present

## 2019-06-24 DIAGNOSIS — L720 Epidermal cyst: Secondary | ICD-10-CM | POA: Diagnosis not present

## 2019-06-24 DIAGNOSIS — Z85828 Personal history of other malignant neoplasm of skin: Secondary | ICD-10-CM | POA: Diagnosis not present

## 2019-06-24 DIAGNOSIS — D1801 Hemangioma of skin and subcutaneous tissue: Secondary | ICD-10-CM | POA: Diagnosis not present

## 2019-06-24 DIAGNOSIS — D225 Melanocytic nevi of trunk: Secondary | ICD-10-CM | POA: Diagnosis not present

## 2019-07-15 ENCOUNTER — Emergency Department: Payer: Medicare Other

## 2019-07-15 ENCOUNTER — Other Ambulatory Visit: Payer: Self-pay

## 2019-07-15 ENCOUNTER — Emergency Department
Admission: EM | Admit: 2019-07-15 | Discharge: 2019-07-15 | Disposition: A | Discharge status: Home / Self Care | Payer: Medicare Other | Attending: Emergency Medicine | Admitting: Emergency Medicine

## 2019-07-15 ENCOUNTER — Encounter: Payer: Self-pay | Admitting: Emergency Medicine

## 2019-07-15 DIAGNOSIS — Z85828 Personal history of other malignant neoplasm of skin: Secondary | ICD-10-CM | POA: Insufficient documentation

## 2019-07-15 DIAGNOSIS — I10 Essential (primary) hypertension: Secondary | ICD-10-CM | POA: Diagnosis not present

## 2019-07-15 DIAGNOSIS — I2510 Atherosclerotic heart disease of native coronary artery without angina pectoris: Secondary | ICD-10-CM | POA: Insufficient documentation

## 2019-07-15 DIAGNOSIS — R1031 Right lower quadrant pain: Secondary | ICD-10-CM | POA: Diagnosis present

## 2019-07-15 DIAGNOSIS — Z87891 Personal history of nicotine dependence: Secondary | ICD-10-CM | POA: Diagnosis not present

## 2019-07-15 DIAGNOSIS — I252 Old myocardial infarction: Secondary | ICD-10-CM | POA: Diagnosis not present

## 2019-07-15 DIAGNOSIS — N200 Calculus of kidney: Secondary | ICD-10-CM | POA: Diagnosis not present

## 2019-07-15 LAB — URINALYSIS, COMPLETE (UACMP) WITH MICROSCOPIC
Bacteria, UA: NONE SEEN
Bilirubin Urine: NEGATIVE
Glucose, UA: NEGATIVE mg/dL
Ketones, ur: NEGATIVE mg/dL
Leukocytes,Ua: NEGATIVE
Nitrite: NEGATIVE
Protein, ur: 100 mg/dL — AB
RBC / HPF: >50 RBC/hpf — ABNORMAL HIGH (ref 0–5)
Specific Gravity, Urine: 1.026 (ref 1.005–1.030)
Squamous Epithelial / HPF: NONE SEEN (ref 0–5)
pH: 6 (ref 5.0–8.0)

## 2019-07-15 LAB — BASIC METABOLIC PANEL
Anion gap: 9 (ref 5–15)
BUN: 19 mg/dL (ref 8–23)
CO2: 26 mmol/L (ref 22–32)
Calcium: 9 mg/dL (ref 8.9–10.3)
Chloride: 106 mmol/L (ref 98–111)
Creatinine, Ser: 0.85 mg/dL (ref 0.61–1.24)
GFR calc Af Amer: >60 mL/min (ref >60)
GFR calc non Af Amer: >60 mL/min (ref >60)
Glucose, Bld: 172 mg/dL — ABNORMAL HIGH (ref 70–99)
Potassium: 4.1 mmol/L (ref 3.5–5.1)
Sodium: 141 mmol/L (ref 135–145)

## 2019-07-15 LAB — CBC
HCT: 40.3 % (ref 39.0–52.0)
Hemoglobin: 13.9 g/dL (ref 13.0–17.0)
MCH: 30.6 pg (ref 26.0–34.0)
MCHC: 34.5 g/dL (ref 30.0–36.0)
MCV: 88.8 fL (ref 80.0–100.0)
Platelets: 206 10*3/uL (ref 150–400)
RBC: 4.54 MIL/uL (ref 4.22–5.81)
RDW: 13 % (ref 11.5–15.5)
WBC: 12 10*3/uL — ABNORMAL HIGH (ref 4.0–10.5)
nRBC: 0 % (ref 0.0–0.2)

## 2019-07-15 LAB — HEPATIC FUNCTION PANEL
ALT: 23 U/L (ref 0–44)
AST: 23 U/L (ref 15–41)
Albumin: 4.2 g/dL (ref 3.5–5.0)
Alkaline Phosphatase: 37 U/L — ABNORMAL LOW (ref 38–126)
Bilirubin, Direct: <0.1 mg/dL (ref 0.0–0.2)
Total Bilirubin: 0.7 mg/dL (ref 0.3–1.2)
Total Protein: 6.7 g/dL (ref 6.5–8.1)

## 2019-07-15 MED ORDER — MORPHINE SULFATE (PF) 4 MG/ML IV SOLN
4.0000 mg | Freq: Once | INTRAVENOUS | Status: AC
  Administered 2019-07-15: 09:00:00 4 mg via INTRAVENOUS
  Filled 2019-07-15: qty 1

## 2019-07-15 MED ORDER — CEPHALEXIN 500 MG PO CAPS
500.0000 mg | ORAL_CAPSULE | Freq: Once | ORAL | Status: AC
  Administered 2019-07-15: 500 mg via ORAL
  Filled 2019-07-15: qty 1

## 2019-07-15 MED ORDER — KETOROLAC TROMETHAMINE 30 MG/ML IJ SOLN
15.0000 mg | Freq: Once | INTRAMUSCULAR | Status: AC
  Administered 2019-07-15: 15 mg via INTRAVENOUS
  Filled 2019-07-15: qty 1

## 2019-07-15 MED ORDER — DOCUSATE SODIUM 100 MG PO CAPS: 100.0000 mg | ORAL_CAPSULE | Freq: Every day | ORAL | 2 refills | Status: DC | PRN

## 2019-07-15 MED ORDER — CEPHALEXIN 250 MG PO CAPS: 250.0000 mg | ORAL_CAPSULE | Freq: Four times a day (QID) | ORAL | 0 refills | Status: AC

## 2019-07-15 MED ORDER — DOCUSATE SODIUM 100 MG PO CAPS
100.0000 mg | ORAL_CAPSULE | Freq: Once | ORAL | Status: AC
  Administered 2019-07-15: 100 mg via ORAL
  Filled 2019-07-15: qty 1

## 2019-07-15 MED ORDER — DOCUSATE SODIUM 100 MG PO CAPS: 100.0000 mg | ORAL_CAPSULE | Freq: Two times a day (BID) | ORAL | 2 refills | Status: DC

## 2019-07-15 MED ORDER — MORPHINE SULFATE (PF) 4 MG/ML IV SOLN
4.0000 mg | Freq: Once | INTRAVENOUS | Status: AC
  Administered 2019-07-15: 4 mg via INTRAVENOUS
  Filled 2019-07-15: qty 1

## 2019-07-15 MED ORDER — OXYCODONE-ACETAMINOPHEN 5-325 MG PO TABS
1.0000 | ORAL_TABLET | Freq: Once | ORAL | Status: AC
  Administered 2019-07-15: 1 via ORAL
  Filled 2019-07-15: qty 1

## 2019-07-15 MED ORDER — ONDANSETRON 4 MG PO TBDP: 4.0000 mg | ORAL_TABLET | Freq: Three times a day (TID) | ORAL | 0 refills | Status: DC | PRN

## 2019-07-15 MED ORDER — OXYCODONE-ACETAMINOPHEN 5-325 MG PO TABS: 1.0000 | ORAL_TABLET | Freq: Two times a day (BID) | ORAL | 0 refills | Status: DC | PRN

## 2019-07-15 MED ORDER — ONDANSETRON HCL 4 MG/2ML IJ SOLN
4.0000 mg | Freq: Once | INTRAMUSCULAR | Status: AC
  Administered 2019-07-15: 09:00:00 4 mg via INTRAVENOUS
  Filled 2019-07-15: qty 2

## 2019-07-15 NOTE — ED Notes (Signed)
Pt on his 3rd cup of water - still not able to urinate.

## 2019-07-15 NOTE — ED Notes (Signed)
Patient transported to CT 

## 2019-07-15 NOTE — ED Provider Notes (Signed)
[facility] Emergency Department Provider Note       Time seen: ----------------------------------------- 8:39 AM on 07/15/2019 -----------------------------------------   I have reviewed the triage vital signs and the nursing notes.  HISTORY   Chief Complaint Flank Pain   HPI Kevin Stewart is a 78 y.o. male with a history of arthritis, hyperlipidemia, hypertension, kidney stones, MI who presents to the ED for right flank and right lower quadrant pain since this morning.  Patient has a history of same he was diagnosed with kidney stones.  Pain is 10 out of 10.  Patient states he has not had a kidney stone about 20 years.  Pain is now in his right inguinal area.  Past Medical History:  Diagnosis Date  . Arthritis    "hands" (12/20/2015)  . Basal cell carcinoma, face   . Coronary artery disease    a. 2003 - light MI per patient; s/p Cypher DES to prox LAD in [facility].  Marland Kitchen Hx of adenomatous colonic polyps   . Hyperlipemia   . Hyperlipidemia   . Hypertension   . Kidney stones    "passed them"  . Melanoma of right side of neck ([place])    removed posterior neck 12/312013  . Migraine    "stopped in 2001 when I had my heart attack"  . Myocardial infarction ([place])   . Pre-diabetes   . Wears dentures    upper-lower partial  . Wears glasses     Patient Active Problem List   Diagnosis Date Noted  . Pre-diabetes 12/21/2015  . Abnormal stress test 12/20/2015  . Hypertension   . Coronary artery disease   . Hyperlipemia     Past Surgical History:  Procedure Laterality Date  . CARDIAC CATHETERIZATION N/A 12/20/2015   Procedure: Left Heart Cath and Coronary Angiography;  Surgeon: Belva Crome, MD;  Location: [place] CV LAB;  Service: Cardiovascular;  Laterality: N/A;  . CARDIAC CATHETERIZATION N/A 12/20/2015   Procedure: Coronary Stent Intervention;  Surgeon: Belva Crome, MD;  Location: [place] CV LAB;  Service: Cardiovascular;  Laterality:  N/A;  . CATARACT EXTRACTION W/ INTRAOCULAR LENS  IMPLANT, BILATERAL Bilateral 2011  . COLONOSCOPY    . CORONARY ANGIOPLASTY WITH STENT PLACEMENT  2001   "1 stent"  . CORONARY ANGIOPLASTY WITH STENT PLACEMENT  12/20/2015   "1 stent"  . HEMORRHOID SURGERY     "burned them off"  . MELANOMA EXCISION WITH SENTINEL LYMPH NODE BIOPSY  07/21/2012   Procedure: MELANOMA EXCISION WITH SENTINEL LYMPH NODE BIOPSY;  Surgeon: Rozetta Nunnery, MD;  Location: [place];  Service: General;  Laterality: N/A;  melanoma excision with sentinel node biopsy   posterior right neck  . TONSILLECTOMY  1961    Allergies Shrimp [shellfish allergy]  Social History Social History   Tobacco Use  . Smoking status: Former Smoker    Packs/day: 2.00    Years: 10.00    Pack years: 20.00    Types: Cigarettes    Quit date: 07/10/1973    Years since quitting: 46.0  . Smokeless tobacco: Former Systems developer    Types: Chew  . Tobacco comment: "chewed a little; stopped in 06/1973"  Substance Use Topics  . Alcohol use: Yes    Comment: 12/20/2015 'quit in ~ 2012-2013"  . Drug use: No    Review of Systems Constitutional: Negative for fever. Cardiovascular: Negative for chest pain. Respiratory: Negative for shortness of breath. Gastrointestinal: Positive for abdominal pain Musculoskeletal: Negative  for back pain. Skin: Negative for rash. Neurological: Negative for headaches, focal weakness or numbness.  All systems negative/normal/unremarkable except as stated in the HPI  ____________________________________________   PHYSICAL EXAM:  VITAL SIGNS: ED Triage Vitals  Enc Vitals Group     BP      Pulse      Resp      Temp      Temp src      SpO2      Weight      Height      Head Circumference      Peak Flow      Pain Score      Pain Loc      Pain Edu?      Excl. in [place]?    Constitutional: Alert and oriented.  Mild distress from pain Eyes: Conjunctivae are normal. Normal extraocular  movements. Cardiovascular: Normal rate, regular rhythm. No murmurs, rubs, or gallops. Respiratory: Normal respiratory effort without tachypnea nor retractions. Breath sounds are clear and equal bilaterally. No wheezes/rales/rhonchi. Gastrointestinal: Right lower quadrant and right inguinal tenderness, normal bowel sounds Musculoskeletal: Nontender with normal range of motion in extremities. No lower extremity tenderness nor edema. Neurologic:  Normal speech and language. No gross focal neurologic deficits are appreciated.  Skin:  Skin is warm, dry and intact. No rash noted. Psychiatric: Mood and affect are normal. Speech and behavior are normal.  ____________________________________________  ED COURSE:  As part of my medical decision making, I reviewed the following data within the [place] History obtained from family if available, nursing notes, old chart and ekg, as well as notes from prior ED visits. Patient presented for right lower quadrant pain, we will assess with labs and imaging as indicated at this time.   Procedures  HART BART was evaluated in Emergency Department on 07/15/2019 for the symptoms described in the history of present illness. He was evaluated in the context of the global COVID-19 pandemic, which necessitated consideration that the patient might be at risk for infection with the SARS-CoV-2 virus that causes COVID-19. Institutional protocols and algorithms that pertain to the evaluation of patients at risk for COVID-19 are in a state of rapid change based on information released by regulatory bodies including the CDC and federal and state organizations. These policies and algorithms were followed during the patient's care in the ED.  ____________________________________________   LABS (pertinent positives/negatives)  Labs Reviewed  URINALYSIS, COMPLETE (UACMP) WITH MICROSCOPIC - Abnormal; Notable for the following components:      Result Value    Color, Urine YELLOW (*)    APPearance CLOUDY (*)    Hgb urine dipstick LARGE (*)    Protein, ur 100 (*)    RBC / HPF >50 (*)    All other components within normal limits  BASIC METABOLIC PANEL - Abnormal; Notable for the following components:   Glucose, Bld 172 (*)    All other components within normal limits  CBC - Abnormal; Notable for the following components:   WBC 12.0 (*)    All other components within normal limits  URINE CULTURE  HEPATIC FUNCTION PANEL    RADIOLOGY Images were viewed by me  CT renal protocol IMPRESSION:  1. 4 mm stone over the mid to distal right ureter causing low-grade  obstruction.   2. 10.7 cm right renal cyst.   3. 2 subcentimeter subpleural right lower lobe nodules with the  larger measuring 3-4 mm. Recommend follow-up noncontrast chest CT 1  year. This recommendation follows the consensus statement:  Guidelines for Management of Small Pulmonary Nodules Detected on CT  Scans: A Statement from the [place] as published in  Radiology 2005; 237:395-400. Online at:  [email or web address].com/.   4. 2.2 cm right adrenal adenoma.   5. Aortic Atherosclerosis (ICD10-I70.0). Atherosclerotic coronary  artery disease.   6. Colonic diverticulosis.  ____________________________________________   DIFFERENTIAL DIAGNOSIS   Renal colic, UTI, polynephritis, hernia, appendicitis  FINAL ASSESSMENT AND PLAN  Renal colic   Plan: The patient had presented for right lower quadrant pain. Patient's labs revealed mild leukocytosis, urine revealed significant hematuria.  We have sent for urine culture as he does have some white cells in the urine but no bacteria.  I have placed him on Keflex, patient's imaging revealed a 4 mm mid to distal ureteral stone.  Patient is much more comfortable now and is cleared for outpatient follow-up.   Laurence Aly, MD    Note: This note was generated in part or whole with  voice recognition software. Voice recognition is usually quite accurate but there are transcription errors that can and very often do occur. I apologize for any typographical errors that were not detected and corrected.     Earleen Newport, MD 07/15/19 [phone]

## 2019-07-15 NOTE — ED Triage Notes (Signed)
Right flank/ right lower abd pain this AM, Hx of same with kidney stones ,

## 2019-07-16 LAB — URINE CULTURE
Culture: NO GROWTH
Special Requests: NORMAL

## 2019-07-22 ENCOUNTER — Other Ambulatory Visit: Payer: Self-pay | Admitting: Urology

## 2019-07-27 ENCOUNTER — Ambulatory Visit: Payer: Self-pay

## 2019-07-27 ENCOUNTER — Encounter: Payer: Self-pay | Admitting: Orthopaedic Surgery

## 2019-07-27 ENCOUNTER — Other Ambulatory Visit: Payer: Self-pay

## 2019-07-27 ENCOUNTER — Ambulatory Visit: Payer: Medicare Other | Admitting: Orthopaedic Surgery

## 2019-07-27 VITALS — Ht 71.0 in | Wt 165.0 lb

## 2019-07-27 DIAGNOSIS — G8929 Other chronic pain: Secondary | ICD-10-CM | POA: Diagnosis not present

## 2019-07-27 DIAGNOSIS — M25511 Pain in right shoulder: Secondary | ICD-10-CM | POA: Insufficient documentation

## 2019-07-27 NOTE — Progress Notes (Signed)
Office Visit Note   Patient: Kevin Stewart           Date of Birth: [birth year]           MRN: [id number] Visit Date: 07/27/2019              Requested by: [name], NP [address] [place],  [place] [address] PCP: [name], NP   Assessment & Plan: Visit Diagnoses:  1. Chronic right shoulder pain     Plan: [name] has at least a 3 to 4-year history of right shoulder pain with difficulty sleeping and overhead motion.  His symptoms are really consistent with a rotator cuff tear.  He is reached a point where he has significant compromise of his activities.  He has tried over-the-counter medicines and some exercises at home but still is significantly compromised.  I would like to obtain an MRI scan rule out rotator cuff tear before proceeding with any treatment given the chronicity of his problem.  Office visit over 40 minutes 50% of the time in counseling  Follow-Up Instructions: Return After MRI scan right shoulder.   Orders:  Orders Placed This Encounter  Procedures  . XR Shoulder Right  . MR SHOULDER RIGHT WO CONTRAST   No orders of the defined types were placed in this encounter.     Procedures: No procedures performed   Clinical Data: No additional findings.   Subjective: Chief Complaint  Patient presents with  . Right Shoulder - Pain  Patient presents today for right shoulder pain. Patient states that his right shoulder has been hurting for 3-4years. He fell off a step ladder a few years ago and states that he may have injured it then. He slipped a few weeks ago while pulling a leaf blower and swung his right arm out to the side to steady himself, causing more pain. He said that his arm bothers him more at night. He has pain with lifting his arm out to the side and upwards over his head. He is right hand dominant. 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 He takes over the counter pain medicine.   Having difficulty with overhead activity and sleeping to the point of compromise.  No neck pain or numbness or tingling into his right upper extremity.   HPI  Review of Systems  Constitutional: Negative for fatigue.  HENT: Negative for ear pain.   Eyes: Negative for pain.  Respiratory: Negative for shortness of breath.   Cardiovascular: Negative for leg swelling.  Gastrointestinal: Negative for constipation and diarrhea.  Endocrine: Negative for cold intolerance and heat intolerance.  Genitourinary: Negative for difficulty urinating.  Musculoskeletal: Negative for joint swelling.  Skin: Negative for rash.  Allergic/Immunologic: Positive for food allergies.  Neurological: Negative for weakness.  Hematological: Does not bruise/bleed easily.  Psychiatric/Behavioral: Negative for sleep disturbance.     Objective: Vital Signs: Ht 5\' 11"  (1.803 m)   Wt 165 lb (74.8 kg)   BMI 23.01 kg/m   Physical Exam Constitutional:      Appearance: He is well-developed.  Eyes:     Pupils: Pupils are equal, round, and reactive to light.  Pulmonary:     Effort: Pulmonary effort is normal.  Skin:    General: Skin is warm and dry.  Neurological:     Mental Status: He is alert and oriented to person, place, and time.  Psychiatric:  Behavior: Behavior normal.     Ortho Exam awake alert and oriented x3.  Comfortable sitting.  Painful overhead arc of motion.  May be very minimal adhesive capsulitis.  Positive impingement and empty can testing.  Biceps intact.  Good grip and release.  Good strength.  Does have some tenderness along the anterior subacromial region without grating or crepitation  Specialty Comments:  No specialty comments available.  Imaging: XR Shoulder Right  Result Date: 07/27/2019 Films of the right shoulder obtained in several projections.  The humeral head is centered about the glenoid.  Normal space between the humeral head and the acromion.  Very minimal degenerative  changes at the [facility] joint.  Probably type I-II acromium.  No ectopic calcification    PMFS History: Patient Active Problem List   Diagnosis Date Noted  . Pain in right shoulder 07/27/2019  . Pre-diabetes 12/21/2015  . Abnormal stress test 12/20/2015  . Hypertension   . Coronary artery disease   . Hyperlipemia    Past Medical History:  Diagnosis Date  . Arthritis    "hands" (12/20/2015)  . Basal cell carcinoma, face   . Coronary artery disease    a. 2003 - light MI per patient; s/p Cypher DES to prox LAD in [facility].  Marland Kitchen Hx of adenomatous colonic polyps   . Hyperlipemia   . Hyperlipidemia   . Hypertension   . Kidney stones    "passed them"  . Melanoma of right side of neck ([place])    removed posterior neck 12/312013  . Migraine    "stopped in 2001 when I had my heart attack"  . Myocardial infarction ([place])   . Pre-diabetes   . Wears dentures    upper-lower partial  . Wears glasses     Family History  Problem Relation Age of Onset  . Heart attack Mother 60  . Cancer - Other Father 46       pancreatic  . Heart attack Brother 22  . Cancer - Colon Brother 18  . Cancer - Other Sister 38    Past Surgical History:  Procedure Laterality Date  . CARDIAC CATHETERIZATION N/A 12/20/2015   Procedure: Left Heart Cath and Coronary Angiography;  Surgeon: Belva Crome, MD;  Location: [place] CV LAB;  Service: Cardiovascular;  Laterality: N/A;  . CARDIAC CATHETERIZATION N/A 12/20/2015   Procedure: Coronary Stent Intervention;  Surgeon: Belva Crome, MD;  Location: [place] CV LAB;  Service: Cardiovascular;  Laterality: N/A;  . CATARACT EXTRACTION W/ INTRAOCULAR LENS  IMPLANT, BILATERAL Bilateral 2011  . COLONOSCOPY    . CORONARY ANGIOPLASTY WITH STENT PLACEMENT  2001   "1 stent"  . CORONARY ANGIOPLASTY WITH STENT PLACEMENT  12/20/2015   "1 stent"  . HEMORRHOID SURGERY     "burned them off"  . MELANOMA EXCISION WITH SENTINEL LYMPH NODE BIOPSY  07/21/2012   Procedure:  MELANOMA EXCISION WITH SENTINEL LYMPH NODE BIOPSY;  Surgeon: Rozetta Nunnery, MD;  Location: [place];  Service: General;  Laterality: N/A;  melanoma excision with sentinel node biopsy   posterior right neck  . TONSILLECTOMY  1961   Social History   Occupational History  . 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 Smoking status: Former Smoker    Packs/day: 2.00    Years: 10.00    Pack years: 20.00    Types: Cigarettes    Quit date: 07/10/1973    Years since quitting: 46.0  . Smokeless  tobacco: Former Systems developer    Types: Chew  . Tobacco comment: "chewed a little; stopped in 06/1973"  Substance and Sexual Activity  . Alcohol use: Yes    Comment: 12/20/2015 'quit in ~ 2012-2013"  . Drug use: No  . Sexual activity: Never

## 2019-07-28 ENCOUNTER — Encounter (HOSPITAL_BASED_OUTPATIENT_CLINIC_OR_DEPARTMENT_OTHER): Payer: Self-pay | Admitting: Urology

## 2019-07-28 ENCOUNTER — Other Ambulatory Visit: Payer: Self-pay

## 2019-07-28 ENCOUNTER — Telehealth: Payer: Self-pay | Admitting: Cardiology

## 2019-07-28 NOTE — Progress Notes (Signed)
Anesthesia Chart Review   Case: [id number] Date/Time: 08/03/19 0945   Procedure: [id number] LASER/STENT PLACEMENT (Right )   Anesthesia type: General   Pre-op diagnosis: RIGHT URETERAL STONE   Location: [facility] OR ROOM 2 / [place]   Surgeons: Ceasar Mons, MD      DISCUSSION:79 y.o. former smoker (20 pack years, quit 07/10/73) with h/o HTN, HLD, CAD (MI, DES 2003, DES to proximal LAD 2017), pre-diabetes, right ureteral stone scheduled for above procedure 08/03/2019 with Dr. Harrell Gave Lovena Neighbours.   Per cardiology, "Chart reviewed as part of pre-operative protocol coverage. Per Dr. [name], patient can hold aspirin and plavix 5 days prior to his upcoming urologic procedure. He should restart aspirin and plavix when cleared to do so by Dr. Lovena Neighbours." VS: Ht 5\' 11"  (1.803 m)   Wt 74.8 kg   BMI 23.01 kg/m   PROVIDERS: [name], NP is PCP   [name], Peter, MD is Cardiologist last seen 04/08/2019, stable at this visit with 1 year follow up recommended.   LABS: labs DOS (all labs ordered are listed, but only abnormal results are displayed)  Labs Reviewed - No data to display   IMAGES:   EKG:  CV: ETT 12/25/2016  Blood pressure demonstrated a hypertensive response to exercise.  Horizontal ST segment depression ST segment depression of 1 mm was noted during stress in the II, III, aVF, V5 and V6 leads, and returning to baseline after less than 1 minute of recovery.   1. Excellent exercise tolerance.  2. Hypertensive BP response.  3. Equivocal changes on exercise ECG: about 1 mm horizontal ST depression in inferior leads and V5,V6 though difficult to tell for sure due to baseline artifact.  ST segments returned to baseline quickly in recovery.   Overall low risk study with excellent exercise tolerance but not normal.  Past Medical History:  Diagnosis Date  . Arthritis    "hands" (12/20/2015)  . Basal cell carcinoma, face   .  Coronary artery disease    a. 2003 - light MI per patient; s/p Cypher DES to prox LAD in [facility].  Marland Kitchen Hx of adenomatous colonic polyps   . Hyperlipemia   . Hyperlipidemia   . Hypertension   . Kidney stones 1978   "passed them"  . Melanoma of right side of neck ([place])    removed posterior neck 12/312013  . Migraine    "stopped in 2000 when I had my heart attack"  . Myocardial infarction ([place]) 2000  . Pre-diabetes    checks cbg q month  . Rotator cuff tear, right   . Wears dentures    upper-lower partial  . Wears glasses     Past Surgical History:  Procedure Laterality Date  . CARDIAC CATHETERIZATION N/A 12/20/2015   Procedure: Left Heart Cath and Coronary Angiography;  Surgeon: Belva Crome, MD;  Location: [place] [facility];  Service: Cardiovascular;  Laterality: N/A;  . CARDIAC CATHETERIZATION N/A 12/20/2015   Procedure: Coronary Stent Intervention;  Surgeon: Belva Crome, MD;  Location: [place] [facility];  Service: Cardiovascular;  Laterality: N/A;  . CATARACT EXTRACTION W/ INTRAOCULAR LENS  IMPLANT, BILATERAL Bilateral 2011  . CIRCUMCISION  age 35  . COLONOSCOPY    . CORONARY ANGIOPLASTY WITH STENT PLACEMENT  2000   "1 stent"  . CORONARY ANGIOPLASTY WITH STENT PLACEMENT  12/20/2015   "1 stent"  . HEMORRHOID SURGERY     "burned them off"  . MELANOMA EXCISION WITH SENTINEL LYMPH  NODE BIOPSY  07/21/2012   Procedure: MELANOMA EXCISION WITH SENTINEL LYMPH NODE BIOPSY;  Surgeon: Rozetta Nunnery, MD;  Location: [place];  Service: General;  Laterality: N/A;  melanoma excision with sentinel node biopsy   posterior right neck  . TONSILLECTOMY  1961    MEDICATIONS: No current facility-administered medications for this encounter.   . Probiotic Product (PROBIOTIC-10 PO)  . aspirin 81 MG tablet  . carvedilol (COREG) 12.5 MG tablet  . clopidogrel (PLAVIX) 75 MG tablet  . co-enzyme Q-10 50 MG capsule  . Enalapril-Hydrochlorothiazide 5-12.5 MG tablet  .  nitroGLYCERIN (NITROSTAT) 0.4 MG SL tablet  . oxyCODONE-acetaminophen (PERCOCET) 5-325 MG tablet  . rosuvastatin (CRESTOR) 5 MG tablet    Maia Plan [facility] Pre-Surgical Testing [phone] 08/02/19  2:21 PM

## 2019-07-28 NOTE — Telephone Encounter (Signed)
New message      [facility] Pre-operative Risk Assessment    Request for surgical clearance:  1. What type of surgery is being performed? Cystoscopy with right retrograde Pyelogram Ureteroscopy laser and stent placement   2. When is this surgery scheduled? 08/03/2019  3. What type of clearance is required (medical clearance vs. Pharmacy clearance to hold med vs. Both)? pharmacy  4. Are there any medications that need to be held prior to surgery and how long?Plavix 5 days prior and aspirin 5 days prior   5. Practice name and name of physician performing surgery? [facility], Dr. Ellison Hughs   6. What is your office phone number [phone] ext [phone]   7.   What is your office fax number [phone]  8.   Anesthesia type (None, local, MAC, general) ? General    Maryjane Hurter 07/28/2019, 1:23 PM  _________________________________________________________________   (provider comments below)

## 2019-07-28 NOTE — Progress Notes (Addendum)
Spoke w/ via phone for pre-op interview--- [place]----   I stat 8, ekg            Lab results------ COVID test ------07-30-2019 Arrive at -------800 am 08-03-2019 NPO after ------midnight Medications to take morning of surgery -----none Diabetic medication -----n/a Patient Special Instructions ----- Pre-Op special Istructions ----- Patient verbalized understanding of instructions that were given at this phone interview. Patient denies shortness of breath, chest pain, fever, cough a this phone interview.  Anesthesia Review:  PCP:dr hamrick  medical sees lynn lam pa Cardiologist : dr [name] lov 04-08-2019 chart/epic Chest x-ray :none EKG :none Stress test 12-25-2016 chart/epic Echo : none Cardiac Cath :  12-20-2015 chart/epicSleep Study/ CPAP : none Fasting Blood Sugar :      / Checks Blood Sugar -- times a day:  Pre diabetci checks cbg q month cbg 109 today Blood Thinner/ Instructions /Last Dose:07-27-2019 ASA / Instructions/ Last Dose : 81 mg aspirin last dose 07-27-2019 Patient given instructions from dr Cassandria Anger,   pharmacy note sent to [facility] to hold aspirin and plavix prior to surgery on 07-28-2019  Patient denies shortness of breath, chest pain, fever, and cough at this phone interview.

## 2019-07-29 DIAGNOSIS — N200 Calculus of kidney: Secondary | ICD-10-CM | POA: Diagnosis not present

## 2019-07-29 DIAGNOSIS — I25118 Atherosclerotic heart disease of native coronary artery with other forms of angina pectoris: Secondary | ICD-10-CM | POA: Diagnosis not present

## 2019-07-29 DIAGNOSIS — E785 Hyperlipidemia, unspecified: Secondary | ICD-10-CM | POA: Diagnosis not present

## 2019-07-29 DIAGNOSIS — I252 Old myocardial infarction: Secondary | ICD-10-CM | POA: Diagnosis not present

## 2019-07-29 DIAGNOSIS — I10 Essential (primary) hypertension: Secondary | ICD-10-CM | POA: Diagnosis not present

## 2019-07-29 DIAGNOSIS — I2510 Atherosclerotic heart disease of native coronary artery without angina pectoris: Secondary | ICD-10-CM | POA: Diagnosis not present

## 2019-07-29 NOTE — Telephone Encounter (Signed)
   Primary Cardiologist: Peter [name], MD  Chart reviewed as part of pre-operative protocol coverage. Per Dr. [name], patient can hold aspirin and plavix 5 days prior to his upcoming urologic procedure. He should restart aspirin and plavix when cleared to do so by Dr. Lovena Neighbours.    I will route this recommendation to the requesting party via Epic fax function and remove from pre-op pool.  Please call with questions.  Abigail Butts, PA-C 07/29/2019, 8:08 AM

## 2019-07-29 NOTE — Telephone Encounter (Signed)
May hold ASA and Plavix for 5 days for this procedure  Inmer Nix [name] MD, [facility]

## 2019-07-30 ENCOUNTER — Other Ambulatory Visit
Admission: RE | Admit: 2019-07-30 | Discharge: 2019-07-30 | Disposition: A | Discharge status: Home / Self Care | Payer: Medicare Other | Source: Ambulatory Visit | Attending: Urology | Admitting: Urology

## 2019-07-30 DIAGNOSIS — Z01812 Encounter for preprocedural laboratory examination: Secondary | ICD-10-CM | POA: Diagnosis not present

## 2019-07-30 DIAGNOSIS — Z20822 Contact with and (suspected) exposure to covid-19: Secondary | ICD-10-CM | POA: Insufficient documentation

## 2019-07-31 LAB — SARS CORONAVIRUS 2 (TAT 6-24 HRS): SARS Coronavirus 2: NEGATIVE

## 2019-08-02 NOTE — Progress Notes (Signed)
Chart reviewed by anesthesia, Konrad Felix PA,  Refer to her progress note to proceed.  Pt's telephone cardiac clearance for asa/ plavix dated 07-29-2019 in epic and copy in chart.

## 2019-08-03 ENCOUNTER — Other Ambulatory Visit: Payer: Self-pay

## 2019-08-03 ENCOUNTER — Encounter (HOSPITAL_BASED_OUTPATIENT_CLINIC_OR_DEPARTMENT_OTHER)
Admission: RE | Disposition: A | Discharge status: Home / Self Care | Payer: Self-pay | Source: Home / Self Care | Attending: Urology

## 2019-08-03 ENCOUNTER — Ambulatory Visit (HOSPITAL_BASED_OUTPATIENT_CLINIC_OR_DEPARTMENT_OTHER)
Admission: RE | Admit: 2019-08-03 | Discharge: 2019-08-03 | Disposition: A | Discharge status: Home / Self Care | Payer: Medicare Other | Attending: Urology | Admitting: Urology

## 2019-08-03 ENCOUNTER — Ambulatory Visit (HOSPITAL_BASED_OUTPATIENT_CLINIC_OR_DEPARTMENT_OTHER): Payer: Medicare Other | Admitting: Physician Assistant

## 2019-08-03 ENCOUNTER — Encounter (HOSPITAL_BASED_OUTPATIENT_CLINIC_OR_DEPARTMENT_OTHER): Payer: Self-pay | Admitting: Urology

## 2019-08-03 DIAGNOSIS — Z955 Presence of coronary angioplasty implant and graft: Secondary | ICD-10-CM | POA: Insufficient documentation

## 2019-08-03 DIAGNOSIS — M19041 Primary osteoarthritis, right hand: Secondary | ICD-10-CM | POA: Insufficient documentation

## 2019-08-03 DIAGNOSIS — Z7902 Long term (current) use of antithrombotics/antiplatelets: Secondary | ICD-10-CM | POA: Diagnosis not present

## 2019-08-03 DIAGNOSIS — Z79899 Other long term (current) drug therapy: Secondary | ICD-10-CM | POA: Insufficient documentation

## 2019-08-03 DIAGNOSIS — I10 Essential (primary) hypertension: Secondary | ICD-10-CM | POA: Diagnosis not present

## 2019-08-03 DIAGNOSIS — N201 Calculus of ureter: Secondary | ICD-10-CM | POA: Insufficient documentation

## 2019-08-03 DIAGNOSIS — I2510 Atherosclerotic heart disease of native coronary artery without angina pectoris: Secondary | ICD-10-CM | POA: Diagnosis not present

## 2019-08-03 DIAGNOSIS — I252 Old myocardial infarction: Secondary | ICD-10-CM | POA: Insufficient documentation

## 2019-08-03 DIAGNOSIS — Z7982 Long term (current) use of aspirin: Secondary | ICD-10-CM | POA: Diagnosis not present

## 2019-08-03 DIAGNOSIS — Z87442 Personal history of urinary calculi: Secondary | ICD-10-CM | POA: Diagnosis not present

## 2019-08-03 DIAGNOSIS — Z8249 Family history of ischemic heart disease and other diseases of the circulatory system: Secondary | ICD-10-CM | POA: Insufficient documentation

## 2019-08-03 DIAGNOSIS — Z87891 Personal history of nicotine dependence: Secondary | ICD-10-CM | POA: Insufficient documentation

## 2019-08-03 DIAGNOSIS — E785 Hyperlipidemia, unspecified: Secondary | ICD-10-CM | POA: Insufficient documentation

## 2019-08-03 DIAGNOSIS — Z85828 Personal history of other malignant neoplasm of skin: Secondary | ICD-10-CM | POA: Insufficient documentation

## 2019-08-03 DIAGNOSIS — M19042 Primary osteoarthritis, left hand: Secondary | ICD-10-CM | POA: Diagnosis not present

## 2019-08-03 DIAGNOSIS — I25119 Atherosclerotic heart disease of native coronary artery with unspecified angina pectoris: Secondary | ICD-10-CM | POA: Diagnosis not present

## 2019-08-03 HISTORY — DX: Unspecified rotator cuff tear or rupture of right shoulder, not specified as traumatic: M75.101

## 2019-08-03 HISTORY — PX: CYSTOSCOPY/URETEROSCOPY/HOLMIUM LASER/STENT PLACEMENT: SHX6546

## 2019-08-03 LAB — POCT I-STAT, CHEM 8
BUN: 17 mg/dL (ref 8–23)
Calcium, Ion: 1.3 mmol/L (ref 1.15–1.40)
Chloride: 100 mmol/L (ref 98–111)
Creatinine, Ser: 0.9 mg/dL (ref 0.61–1.24)
Glucose, Bld: 118 mg/dL — ABNORMAL HIGH (ref 70–99)
HCT: 43 % (ref 39.0–52.0)
Hemoglobin: 14.6 g/dL (ref 13.0–17.0)
Potassium: 3.9 mmol/L (ref 3.5–5.1)
Sodium: 138 mmol/L (ref 135–145)
TCO2: 29 mmol/L (ref 22–32)

## 2019-08-03 SURGERY — CYSTOSCOPY/URETEROSCOPY/HOLMIUM LASER/STENT PLACEMENT
Anesthesia: General | Site: Renal | Laterality: Right

## 2019-08-03 MED ORDER — KETOROLAC TROMETHAMINE 30 MG/ML IJ SOLN
INTRAMUSCULAR | Status: DC | PRN
  Administered 2019-08-03: 15 mg via INTRAVENOUS

## 2019-08-03 MED ORDER — FENTANYL CITRATE (PF) 100 MCG/2ML IJ SOLN
INTRAMUSCULAR | Status: DC | PRN
  Administered 2019-08-03: 50 ug via INTRAVENOUS
  Administered 2019-08-03 (×2): 25 ug via INTRAVENOUS

## 2019-08-03 MED ORDER — CEPHALEXIN 500 MG PO CAPS: 500.0000 mg | ORAL_CAPSULE | Freq: Two times a day (BID) | ORAL | 0 refills | Status: AC

## 2019-08-03 MED ORDER — SODIUM CHLORIDE 0.9 % IR SOLN
Status: DC | PRN
  Administered 2019-08-03: 3000 mL

## 2019-08-03 MED ORDER — PROMETHAZINE HCL 25 MG/ML IJ SOLN
6.2500 mg | INTRAMUSCULAR | Status: DC | PRN
  Filled 2019-08-03: qty 1

## 2019-08-03 MED ORDER — FENTANYL CITRATE (PF) 100 MCG/2ML IJ SOLN
25.0000 ug | INTRAMUSCULAR | Status: DC | PRN
  Filled 2019-08-03: qty 1

## 2019-08-03 MED ORDER — TRAMADOL HCL 50 MG PO TABS: 50.0000 mg | ORAL_TABLET | Freq: Four times a day (QID) | ORAL | 0 refills | Status: AC | PRN

## 2019-08-03 MED ORDER — ONDANSETRON HCL 4 MG/2ML IJ SOLN
INTRAMUSCULAR | Status: AC
  Filled 2019-08-03: qty 2

## 2019-08-03 MED ORDER — FENTANYL CITRATE (PF) 100 MCG/2ML IJ SOLN: INTRAMUSCULAR | Status: DC | PRN

## 2019-08-03 MED ORDER — CEFAZOLIN SODIUM-DEXTROSE 2-4 GM/100ML-% IV SOLN
2.0000 g | Freq: Once | INTRAVENOUS | Status: AC
  Administered 2019-08-03: 2 g via INTRAVENOUS
  Filled 2019-08-03: qty 100

## 2019-08-03 MED ORDER — METOPROLOL TARTRATE 5 MG/5ML IV SOLN
INTRAVENOUS | Status: DC | PRN
  Administered 2019-08-03: 1.5 mg via INTRAVENOUS
  Administered 2019-08-03: 1 mg via INTRAVENOUS

## 2019-08-03 MED ORDER — PROPOFOL 10 MG/ML IV BOLUS
INTRAVENOUS | Status: AC
  Filled 2019-08-03: qty 20

## 2019-08-03 MED ORDER — KETOROLAC TROMETHAMINE 30 MG/ML IJ SOLN
INTRAMUSCULAR | Status: AC
  Filled 2019-08-03: qty 1

## 2019-08-03 MED ORDER — METOPROLOL TARTRATE 5 MG/5ML IV SOLN
INTRAVENOUS | Status: AC
  Filled 2019-08-03: qty 5

## 2019-08-03 MED ORDER — LIDOCAINE 2% (20 MG/ML) 5 ML SYRINGE
INTRAMUSCULAR | Status: AC
  Filled 2019-08-03: qty 5

## 2019-08-03 MED ORDER — FENTANYL CITRATE (PF) 100 MCG/2ML IJ SOLN
INTRAMUSCULAR | Status: AC
  Filled 2019-08-03: qty 2

## 2019-08-03 MED ORDER — IOHEXOL 300 MG/ML  SOLN
INTRAMUSCULAR | Status: DC | PRN
  Administered 2019-08-03: 6 mL

## 2019-08-03 MED ORDER — ONDANSETRON HCL 4 MG/2ML IJ SOLN
INTRAMUSCULAR | Status: DC | PRN
  Administered 2019-08-03: 4 mg via INTRAVENOUS

## 2019-08-03 MED ORDER — DEXAMETHASONE SODIUM PHOSPHATE 4 MG/ML IJ SOLN
INTRAMUSCULAR | Status: DC | PRN
  Administered 2019-08-03: 5 mg via INTRAVENOUS

## 2019-08-03 MED ORDER — LACTATED RINGERS IV SOLN
INTRAVENOUS | Status: DC
  Filled 2019-08-03: qty 1000

## 2019-08-03 MED ORDER — PROPOFOL 10 MG/ML IV BOLUS
INTRAVENOUS | Status: DC | PRN
  Administered 2019-08-03: 150 mg via INTRAVENOUS

## 2019-08-03 MED ORDER — OXYBUTYNIN CHLORIDE 5 MG PO TABS: 5.0000 mg | ORAL_TABLET | Freq: Three times a day (TID) | ORAL | 1 refills | Status: DC | PRN

## 2019-08-03 MED ORDER — CEFAZOLIN SODIUM-DEXTROSE 2-4 GM/100ML-% IV SOLN
INTRAVENOUS | Status: AC
  Filled 2019-08-03: qty 100

## 2019-08-03 MED ORDER — LIDOCAINE HCL (CARDIAC) PF 100 MG/5ML IV SOSY
PREFILLED_SYRINGE | INTRAVENOUS | Status: DC | PRN
  Administered 2019-08-03: 60 mg via INTRAVENOUS

## 2019-08-03 MED ORDER — DEXAMETHASONE SODIUM PHOSPHATE 10 MG/ML IJ SOLN
INTRAMUSCULAR | Status: AC
  Filled 2019-08-03: qty 1

## 2019-08-03 SURGICAL SUPPLY — 28 items
BAG DRAIN URO-CYSTO SKYTR STRL (DRAIN) ×3 IMPLANT
BASKET STONE 1.7 NGAGE (UROLOGICAL SUPPLIES) IMPLANT
BASKET ZERO TIP NITINOL 2.4FR (BASKET) ×3 IMPLANT
BENZOIN TINCTURE PRP APPL 2/3 (GAUZE/BANDAGES/DRESSINGS) IMPLANT
CATH URET 5FR 28IN OPEN ENDED (CATHETERS) IMPLANT
CLOSURE WOUND 1/2 X4 (GAUZE/BANDAGES/DRESSINGS)
CLOTH BEACON ORANGE TIMEOUT ST (SAFETY) ×3 IMPLANT
FIBER LASER FLEXIVA 365 (UROLOGICAL SUPPLIES) IMPLANT
FIBER LASER TRAC TIP (UROLOGICAL SUPPLIES) IMPLANT
GLOVE BIO SURGEON STRL SZ7.5 (GLOVE) ×3 IMPLANT
GLOVE BIOGEL PI IND STRL 8 (GLOVE) ×2 IMPLANT
GLOVE BIOGEL PI INDICATOR 8 (GLOVE) ×4
GOWN STRL REUS W/TWL XL LVL3 (GOWN DISPOSABLE) ×6 IMPLANT
GUIDEWIRE STR DUAL SENSOR (WIRE) ×3 IMPLANT
GUIDEWIRE ZIPWRE .038 STRAIGHT (WIRE) ×3 IMPLANT
IV NS 1000ML (IV SOLUTION)
IV NS 1000ML BAXH (IV SOLUTION) IMPLANT
IV NS IRRIG 3000ML ARTHROMATIC (IV SOLUTION) ×3 IMPLANT
KIT TURNOVER CYSTO (KITS) ×3 IMPLANT
MANIFOLD NEPTUNE II (INSTRUMENTS) ×3 IMPLANT
NS IRRIG 500ML POUR BTL (IV SOLUTION) IMPLANT
PACK CYSTO (CUSTOM PROCEDURE TRAY) ×3 IMPLANT
STENT URET 6FRX26 CONTOUR (STENTS) ×3 IMPLANT
STRIP CLOSURE SKIN 1/2X4 (GAUZE/BANDAGES/DRESSINGS) IMPLANT
SYR 10ML LL (SYRINGE) ×3 IMPLANT
TUBE CONNECTING 12'X1/4 (SUCTIONS) ×1
TUBE CONNECTING 12X1/4 (SUCTIONS) ×2 IMPLANT
TUBING UROLOGY SET (TUBING) ×3 IMPLANT

## 2019-08-03 NOTE — Transfer of Care (Signed)
Immediate Anesthesia Transfer of Care Note  Patient: Kevin Stewart  Procedure(s) Performed: Procedure(s) (LRB): CYSTOSCOPY/RETROGRADE/URETEROSCOPY//STENT PLACEMENT (Right)  Patient Location: PACU  Anesthesia Type: General  Level of Consciousness: awake, sedated, patient cooperative and responds to stimulation  Airway & Oxygen Therapy: Patient Spontanous Breathing and Patient connected to [place] 02 and soft FM   Post-op Assessment: Report given to PACU RN, Post -op Vital signs reviewed and stable and Patient moving all extremities  Post vital signs: Reviewed and stable  Complications: No apparent anesthesia complications

## 2019-08-03 NOTE — Anesthesia Procedure Notes (Signed)
Procedure Name: LMA Insertion Date/Time: 08/03/2019 9:49 AM Performed by: Justice Rocher, CRNA Pre-anesthesia Checklist: Patient identified, Emergency Drugs available, Suction available and Patient being monitored Patient Re-evaluated:Patient Re-evaluated prior to induction Oxygen Delivery Method: Circle system utilized Preoxygenation: Pre-oxygenation with 100% oxygen Induction Type: IV induction Ventilation: Mask ventilation without difficulty LMA: LMA inserted LMA Size: 4.0 Number of attempts: 1 Airway Equipment and Method: Bite block Placement Confirmation: positive ETCO2 and breath sounds checked- equal and bilateral Tube secured with: Tape Dental Injury: Teeth and Oropharynx as per pre-operative assessment  Comments: Teeth poor condition - lower

## 2019-08-03 NOTE — Op Note (Signed)
Operative Note  Preoperative diagnosis:  1.  4 mm right distal ureteral calculus  Postoperative diagnosis: 1.  Multiple 1 mm right distal ureteral stone fragments  Procedure(s): 1.  Cystoscopy with right ureteroscopy and removal of ureteral stone 2.  Right retrograde pyelogram with intraoperative interpretation of fluoroscopic imaging  Surgeon: Ellison Hughs, MD  Assistants:  None  Anesthesia:  General  Complications:  None  EBL: Less than 5 mL  Specimens: 1.  Right ureteral stone  Drains/Catheters: 1.  Right 6 French, 24 cm JJ stent with tether  Intraoperative findings:   1. Multiple small stone fragments in the distal aspects of the right distal ureter 2. Solitary right collecting system with no filling defects or dilation involving the right ureter or right renal pelvis seen on retrograde pyelogram 3. Prominent median lobe  Indication:  Kevin Stewart is a 79 y.o. male with seen in the ED on 07/15/2019 and was found to have a 4 mm right distal ureteral calculus on CT stone study.  He continues to have intermittent episodes of right-sided flank pain.  He has been straining his urine and has not seen a stone pass.  He has been consented for the above procedures, voices understanding and wishes to proceed.  Description of procedure:  After informed consent was obtained, the patient was brought to the operating room and general LMA anesthesia was administered. The patient was then placed in the dorsolithotomy position and prepped and draped in the usual sterile fashion. A timeout was performed. A 23 French rigid cystoscope was then inserted into the urethral meatus and advanced into the bladder under direct vision. A complete bladder survey revealed no intravesical pathology.  A 5 French ureteral catheter was then inserted into the right ureteral orifice and a retrograde pyelogram was obtained, with the findings listed above.  A Glidewire was then used to intubate the  lumen of the ureteral catheter and was advanced up to the right renal pelvis, under fluoroscopic guidance.  The catheter was then removed, leaving the wire in place.  A semirigid ureteroscope was then advanced into the distal aspects of the right ureter, where I immediately identified multiple small stone fragments.  The stone fragments quickly migrated out of the right ureter after I retracted the ureteroscope.  I advanced the scope up to the proximal one third of the ureter and did not identify any residual stone debris or ureteral abnormalities.  The flexible ureteroscope was then removed under direct vision, leaving the wire in place.  A 6 French, 24 cm JJ stent was then placed over the wire and into good position within the right collecting system, confirming placement via fluoroscopy.  The tether the stent was left intact.  The patient's bladder was drained and his small stone fragments were removed.  The tether the stent was then secured to the dorsal aspect of the penis using benzoin and Steri-Strips.  He tolerated the procedure well and was transferred to the postanesthesia in stable condition.  Plan: The patient has been instructed to remove his stent, which is on a tether, at 7 AM on 08/05/2019.  He will follow-up in 6 weeks for a right renal ultrasound.

## 2019-08-03 NOTE — H&P (Signed)
Office Visit Report     07/20/2019   --------------------------------------------------------------------------------   Kevin Stewart  MRN: [id number]  DOB: 05-16-1941, 79 year old Male  SSN:    PRIMARY CARE:  Charlott Holler, NP  REFERRING:    PROVIDER:  Ellison Hughs, M.D.  LOCATION:  [facility] [phone]     --------------------------------------------------------------------------------   CC: I have pain in the flank.  HPI: Kevin Stewart is a 79 year-old male patient who is here for flank pain.  The problem is on the right side. His pain started about 07/15/2019. The pain is sharp. The intensity of his pain is rated as a 6. The pain is intermittent. The pain does radiate.   Pain medications< makes the pain better. Sitting makes the pain worse. He was treated with the following pain medication(s): Oxycodone.   He has had this same pain previously. He has had kidney stones.   -Seen in the ED on 07/15/19 with acute right flank pain.  -CTSS showed a 4 mm right mid-ureteral stone with mild hydronephrosis  -Hx of HTN, HLD, CAD--hx of MI ~20 years ago, s/p 2 coronary stents(on aspirin and plavix)-- followed by Dr. Peter [name]  -Pain is currently well controlled with ibuprofen. Has Percocet if needed.  -Remote hx of stones ~40 years ago, but has never required surgery.  -No urinary issues today. Denies IVS, dysuria or hematuria.  -Denies N/V/F/C   Labs 07/15/19:  Serum creatinine- 0.85  WBC- 12.0  Urine culture- negative   AUA Symptom Score: He never has the sensation of not emptying his bladder completely after finishing urinating. He never has to urinate again less that two hours after he has finished urinating. He does not have to stop and start again several times when he urinates. He never finds it difficult to postpone urination. He never has a weak urinary stream. He never has to push or strain to begin urination. He has to get up to urinate 1 time  from the time he goes to bed until the time he gets up in the morning.   Calculated AUA Symptom Score: 1    ALLERGIES: None   MEDICATIONS: Aspirin 81 mg tablet,chewable  Carvedilol  Cephalexin 250 mg tablet  Clopidogrel  Enalapril Maleate  Oxycodone-Acetaminophen 5 mg-325 mg tablet  Rosuvastatin Calcium    GU PSH: Circumcision    PSH Notes: wisdom teeth   NON-GU PSH: Cataract surgery, Bilateral Removal of skin cancer Tonsillectomy  GU PMH: None   NON-GU PMH: Acute myocarditis, unspecified Basal cell carcinoma of skin of nose Hypertension    FAMILY HISTORY: Colon Cancer - Brother Heart Attack - Mother pancreatic cancer - Father, Sister   SOCIAL HISTORY: Marital Status: Married Preferred Language: English; Race: White Current Smoking Status: Patient does not smoke anymore.   Tobacco Use Assessment Completed: Used Tobacco in last 30 days? Does not use smokeless tobacco. Has never drank.  Does not drink caffeine. Has not had a blood transfusion.    REVIEW OF SYSTEMS:    GU Review Male:   Patient reports burning/ pain with urination, get up at night to urinate, and erection problems. Patient denies frequent urination, hard to postpone urination, leakage of urine, stream starts and stops, trouble starting your stream, have to strain to urinate , and penile pain.  Gastrointestinal (Upper):   Patient reports nausea. Patient denies vomiting and indigestion/ heartburn.  Gastrointestinal (Lower):   Patient denies diarrhea and constipation.  Constitutional:   Patient denies fever, night sweats,  weight loss, and fatigue.  Skin:   Patient denies skin rash/ lesion and itching.  Eyes:   Patient denies blurred vision and double vision.  Ears/ Nose/ Throat:   Patient reports sinus problems. Patient denies sore throat.  Hematologic/Lymphatic:   Patient denies swollen glands and easy bruising.  Cardiovascular:   Patient denies leg swelling and chest pains.  Respiratory:   Patient  denies cough and shortness of breath.  Endocrine:   Patient denies excessive thirst.  Musculoskeletal:   Patient reports joint pain. Patient denies back pain.  Neurological:   Patient reports headaches. Patient denies dizziness.  Psychologic:   Patient denies depression and anxiety.   Notes: R side flank pain , History of Kidney stones     VITAL SIGNS:      07/20/2019 09:43 AM  Weight 165 lb / 74.84 kg  Height 71 in / 180.34 cm  BP 171/85 mmHg  Heart Rate 71 /min  Temperature 98.7 F / 37.0 C  BMI 23.0 kg/m   MULTI-SYSTEM PHYSICAL EXAMINATION:    Constitutional: Well-nourished. No physical deformities. Normally developed. Good grooming.  Neck: Neck symmetrical, not swollen. Normal tracheal position.  Respiratory: No labored breathing, no use of accessory muscles.   Cardiovascular: Normal temperature, normal extremity pulses, no swelling, no varicosities.  Neurologic / Psychiatric: Oriented to time, oriented to place, oriented to person. No depression, no anxiety, no agitation.  Gastrointestinal: No mass, no tenderness, no rigidity, non obese abdomen.  Eyes: Normal conjunctivae. Normal eyelids.  Musculoskeletal: Normal gait and station of head and neck.     PAST DATA REVIEWED:  Source Of History:  Patient  Notes:                     CLINICAL DATA: Right-sided abdominal/flank pain beginning this  morning. History of melanoma 5 years ago per     EXAM:  CT ABDOMEN AND PELVIS WITHOUT CONTRAST     TECHNIQUE:  Multidetector CT imaging of the abdomen and pelvis was performed  following the standard protocol without IV contrast.     COMPARISON: None.     FINDINGS:  Lower chest: Lung bases demonstrate a 2-3 mm subpleural nodule over  the lateral right lower lobe on the most superior image. There is a  4 mm subpleural nodular density over the lateral right lower lobe.  Calcified plaque over the right coronary artery and descending  thoracic aorta.     Hepatobiliary: Liver,  gallbladder and biliary tree are normal.     Pancreas: Normal.     Spleen: Normal.     Adrenals/Urinary Tract: 2.2 cm right adrenal adenoma. Left adrenal  gland is normal. Kidneys are normal in size. There is a large 10.7  cm cyst over the lower pole right kidney. Punctate nonobstructing  stone over the upper pole right kidney. There is mild dilatation of  the right intrarenal collecting system. There is a 4 mm stone over  the mid to distal right ureter left ureter and bladder are normal.     Stomach/Bowel: Stomach and small bowel are normal. Appendix is  normal. There is mild diverticulosis of the colon.     Vascular/Lymphatic: There is moderate calcified plaque over the  abdominal aorta which is normal in caliber. No evidence of  adenopathy.     Reproductive: Normal.     Other: No free fluid or focal inflammatory change.     Musculoskeletal: Degenerative changes of the spine. Minimal  degenerate change of the hips.  IMPRESSION:  1. 4 mm stone over the mid to distal right ureter causing low-grade  obstruction.     2. 10.7 cm right renal cyst.     3. 2 subcentimeter subpleural right lower lobe nodules with the  larger measuring 3-4 mm. Recommend follow-up noncontrast chest CT 1  year. This recommendation follows the consensus statement:  Guidelines for Management of Small Pulmonary Nodules Detected on CT  Scans: A Statement from the [place] as published in  Radiology 2005; 237:395-400. Online at:  [email or web address].[email or web address].com/.     4. 2.2 cm right adrenal adenoma.     5. Aortic Atherosclerosis (ICD10-I70.0). Atherosclerotic coronary  artery disease.     6. Colonic diverticulosis.        Electronically Signed  By: Marin Olp M.D.  On: 07/15/2019 09:35   PROCEDURES:         Renal Ultrasound (Limited) [id number]  Kidney:RIGHT Length: 12.4 cm Depth:7.0 cm Cortical Width: 1.4 cm Width:6.7 cm    Right Kidney/Ureter:  MILD/MOD  HYDRO SEEN W LARGE LOWER POLE SIMPLE APPEARING CYST- 10.2 x 9.7 x 9.2 cm  Bladder:  PVR- 113 ml There is a 4.7 mm post RT ? UVJ shadowing calc       . Patient confirmed No Neulasta OnPro Device.           Urinalysis w/Scope Dipstick Dipstick Cont'd Micro  Color: Yellow Bilirubin: Neg mg/dL WBC/hpf: 0 - 5/hpf  Appearance: Clear Ketones: Neg mg/dL RBC/hpf: 10 - 20/hpf  Specific Gravity: 1.025 Blood: 2+ ery/uL Bacteria: NS (Not Seen)  pH: 5.5 Protein: Neg mg/dL Cystals: NS (Not Seen)  Glucose: Neg mg/dL Urobilinogen: 0.2 mg/dL Casts: NS (Not Seen)    Nitrites: Neg Trichomonas: Not Present    Leukocyte Esterase: Neg leu/uL Mucous: Not Present      Epithelial Cells: NS (Not Seen)      Yeast: NS (Not Seen)      Sperm: Not Present    ASSESSMENT:      ICD-10 Details  1 GU:   Ureteral calculus - N20.1   2   Flank Pain - R10.84   3   Ureteral obstruction secondary to calculous - N13.2   4   Renal cyst - N28.1 10 cm right renal cyst ([place] I)   PLAN:           Orders X-Rays: Renal Ultrasound (Limited) - Right RUS          Schedule Return Visit/Planned Activity: 2 Weeks - Office Visit, Follow up MD, Cystoscopy             Note: ok to double book           Document Letter(s):  Created for Patient: Clinical Summary         Notes:   -RUS shows persistent mild right hydronephrosis and a simple 10 cm renal cyst. Will continue to monitor his renal cyst.  -The risks, benefits and alternatives of cystoscopy with RIGHT ureteroscopy, laser lithotripsy and ureteral stent placement was discussed the patient. Risks included, but are not limited to: bleeding, urinary tract infection, ureteral injury/avulsion, ureteral stricture formation, retained stone fragments, the possibility that multiple surgeries may be required to treat the stone(s), MI, stroke, PE and the inherent risks of general anesthesia. The patient voices understanding and wishes to proceed. We discussed criteria to return to  clinic or proceed to the ER which include: Fever/chills, worsening pain, nausea/vomiting and/or persistent gross hematuria.   asdf

## 2019-08-03 NOTE — Anesthesia Postprocedure Evaluation (Signed)
Anesthesia Post Note  Patient: Kevin Stewart  Procedure(s) Performed: CYSTOSCOPY/RETROGRADE/URETEROSCOPY//STENT PLACEMENT (Right Renal)     Patient location during evaluation: PACU Anesthesia Type: General Level of consciousness: awake and alert Pain management: pain level controlled Vital Signs Assessment: post-procedure vital signs reviewed and stable Respiratory status: spontaneous breathing, nonlabored ventilation, respiratory function stable and patient connected to nasal cannula oxygen Cardiovascular status: blood pressure returned to baseline and stable Postop Assessment: no apparent nausea or vomiting Anesthetic complications: no    Last Vitals:  Vitals:   08/03/19 1110 08/03/19 1150  BP:  (!) 159/83  Pulse: 73 65  Resp: 13 18  Temp: 36.6 C   SpO2: 100% 100%    Last Pain:  Vitals:   08/03/19 1150  TempSrc:   PainSc: 0-No pain                 Tiajuana Amass

## 2019-08-03 NOTE — Anesthesia Preprocedure Evaluation (Signed)
Anesthesia Evaluation  Patient identified by MRN, date of birth, ID band Patient awake    Reviewed: Allergy & Precautions, NPO status , Patient's Chart, lab work & pertinent test results, reviewed documented beta blocker date and time   Airway Mallampati: II  TM Distance: >3 FB Neck ROM: Full    Dental  (+) Dental Advisory Given   Pulmonary former smoker,    breath sounds clear to auscultation       Cardiovascular hypertension, Pt. on medications and Pt. on home beta blockers + angina with exertion + CAD and + Past MI   Rhythm:Regular Rate:Normal     Neuro/Psych  Headaches,    GI/Hepatic negative GI ROS, Neg liver ROS,   Endo/Other  negative endocrine ROS  Renal/GU Renal disease     Musculoskeletal  (+) Arthritis ,   Abdominal   Peds  Hematology negative hematology ROS (+)   Anesthesia Other Findings   Reproductive/Obstetrics                             Anesthesia Physical Anesthesia Plan  ASA: III  Anesthesia Plan: General   Post-op Pain Management:    Induction: Intravenous  PONV Risk Score and Plan: 2 and Dexamethasone, Ondansetron and Treatment may vary due to age or medical condition  Airway Management Planned: LMA  Additional Equipment:   Intra-op Plan:   Post-operative Plan: Extubation in OR  Informed Consent: I have reviewed the patients History and Physical, chart, labs and discussed the procedure including the risks, benefits and alternatives for the proposed anesthesia with the patient or authorized representative who has indicated his/her understanding and acceptance.     Dental advisory given  Plan Discussed with: CRNA  Anesthesia Plan Comments:         Anesthesia Quick Evaluation

## 2019-08-17 ENCOUNTER — Ambulatory Visit
Admission: RE | Admit: 2019-08-17 | Discharge: 2019-08-17 | Disposition: A | Discharge status: Home / Self Care | Payer: Medicare Other | Source: Ambulatory Visit | Attending: Orthopaedic Surgery | Admitting: Orthopaedic Surgery

## 2019-08-17 DIAGNOSIS — G8929 Other chronic pain: Secondary | ICD-10-CM

## 2019-08-17 DIAGNOSIS — M25511 Pain in right shoulder: Secondary | ICD-10-CM | POA: Diagnosis not present

## 2019-08-19 ENCOUNTER — Ambulatory Visit: Payer: Medicare Other | Admitting: Orthopaedic Surgery

## 2019-08-19 ENCOUNTER — Other Ambulatory Visit: Payer: Self-pay

## 2019-08-19 ENCOUNTER — Encounter: Payer: Self-pay | Admitting: Orthopaedic Surgery

## 2019-08-19 VITALS — Ht 71.0 in | Wt 165.0 lb

## 2019-08-19 DIAGNOSIS — G8929 Other chronic pain: Secondary | ICD-10-CM

## 2019-08-19 DIAGNOSIS — M25511 Pain in right shoulder: Secondary | ICD-10-CM | POA: Diagnosis not present

## 2019-08-19 NOTE — Progress Notes (Signed)
Office Visit Note   Patient: Kevin Stewart           Date of Birth: [birth year]           MRN: [id number] Visit Date: 08/19/2019              Requested by: [name], NP [address] [place],  [place] [address] PCP: [name], NP   Assessment & Plan: Visit Diagnoses:  1. Chronic right shoulder pain     Plan: MRI scan of right shoulder demonstrates a full-thickness nonretracted tear of the supraspinatus in the region of the critical zone.  The infraspinatus tendon was intact.  There was a partial-thickness fraying of the more superior aspect of the distal subscapularis and the teres minor was intact.  No muscle atrophy.  Intra-articular biceps tendon was ill-defined and release consistent with partial tearing.  There was mild arthropathy of the [facility] joint and a small subacromial subdeltoid bursal fluid.  There was very mild chondral irregularity in the glenohumeral joint without discrete full-thickness tear.  Possibly some tearing of the anterior inferior labrum.  No bone marrow abnormality.  Long discussion with Mr. Palley regarding the above.  He has had a chronic problem and would like to proceed with surgery.  This would include an arthroscopic SCD DCR and mini open rotator cuff tear repair and probable biceps tenodesis.  I discussed the surgery the rehab, use of sling.  It will be an inconvenience for him for a while as it is his right dominant arm but he notes he has his wife at home to help.  He does have a cardiac history and will need cardiac clearance.  He is on Plavix and aspirin  Follow-Up Instructions: Return We will schedule rotator cuff tear repair surgery.   Orders:  No orders of the defined types were placed in this encounter.  No orders of the defined types were placed in this encounter.     Procedures: No procedures performed   Clinical Data: No additional findings.   Subjective: Chief Complaint  Patient presents with  . Right Shoulder - Follow-up   MRI results  Patient presents today for follow up on his right shoulder. He had an MRI on 08/17/2019. No changes since his last visit.   HPI  Review of Systems   Objective: Vital Signs: Ht 5\' 11"  (1.803 m)   Wt 165 lb (74.8 kg)   BMI 23.01 kg/m   Physical Exam Constitutional:      Appearance: He is well-developed.  Eyes:     Pupils: Pupils are equal, round, and reactive to light.  Pulmonary:     Effort: Pulmonary effort is normal.  Skin:    General: Skin is warm and dry.  Neurological:     Mental Status: He is alert and oriented to person, place, and time.  Psychiatric:        Behavior: Behavior normal.     Ortho Exam awake alert and oriented x3.  Comfortable sitting.  Positive impingement and empty can testing right upper extremity.  Good strength.  Positive Speed sign.  Some tenderness over the anterior and lateral subacromial region.  Little bit of popping and clicking with overhead motion but no loss of flexion.  Good grip and good release  Specialty Comments:  No specialty comments available.  Imaging: No results found.   PMFS History: Patient Active Problem List   Diagnosis Date Noted  . Pain in right shoulder 07/27/2019  .  Pre-diabetes 12/21/2015  . Abnormal stress test 12/20/2015  . Hypertension   . Coronary artery disease   . Hyperlipemia    Past Medical History:  Diagnosis Date  . Arthritis    "hands" (12/20/2015)  . Basal cell carcinoma, face   . Coronary artery disease    a. 2003 - light MI per patient; s/p Cypher DES to prox LAD in [facility].  Marland Kitchen Hx of adenomatous colonic polyps   . Hyperlipemia   . Hyperlipidemia   . Hypertension   . Kidney stones 1978   "passed them"  . Melanoma of right side of neck ([place])    removed posterior neck 12/312013  . Migraine    "stopped in 2000 when I had my heart attack"  . Myocardial infarction ([place]) 2000  . Pre-diabetes    checks cbg q month  . Rotator cuff tear, right   . Wears dentures     upper-lower partial  . Wears glasses     Family History  Problem Relation Age of Onset  . Heart attack Mother 61  . Cancer - Other Father 58       pancreatic  . Heart attack Brother 24  . Cancer - Colon Brother 80  . Cancer - Other Sister 51    Past Surgical History:  Procedure Laterality Date  . CARDIAC CATHETERIZATION N/A 12/20/2015   Procedure: Left Heart Cath and Coronary Angiography;  Surgeon: Belva Crome, MD;  Location: [place] [facility];  Service: Cardiovascular;  Laterality: N/A;  . CARDIAC CATHETERIZATION N/A 12/20/2015   Procedure: Coronary Stent Intervention;  Surgeon: Belva Crome, MD;  Location: [place] [facility];  Service: Cardiovascular;  Laterality: N/A;  . CATARACT EXTRACTION W/ INTRAOCULAR LENS  IMPLANT, BILATERAL Bilateral 2011  . CIRCUMCISION  age 46  . COLONOSCOPY    . CORONARY ANGIOPLASTY WITH STENT PLACEMENT  2000   "1 stent"  . CORONARY ANGIOPLASTY WITH STENT PLACEMENT  12/20/2015   "1 stent"  . CYSTOSCOPY/URETEROSCOPY/HOLMIUM LASER/STENT PLACEMENT Right 08/03/2019   Procedure: CYSTOSCOPY/RETROGRADE/URETEROSCOPY//STENT PLACEMENT;  Surgeon: Ceasar Mons, MD;  Location: [facility];  Service: Urology;  Laterality: Right;  . HEMORRHOID SURGERY     "burned them off"  . MELANOMA EXCISION WITH SENTINEL LYMPH NODE BIOPSY  07/21/2012   Procedure: MELANOMA EXCISION WITH SENTINEL LYMPH NODE BIOPSY;  Surgeon: Rozetta Nunnery, MD;  Location: [place];  Service: General;  Laterality: N/A;  melanoma excision with sentinel node biopsy   posterior right neck  . TONSILLECTOMY  1961   Social History   Occupational History  . 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 Smoking status: Former Smoker    Packs/day: 2.00    Years: 10.00    Pack years: 20.00    Types: Cigarettes    Quit date: 07/10/1973    Years since quitting: 46.1  . Smokeless tobacco: Former Systems developer    Types: Chew  . Tobacco comment:  "chewed a little; stopped in 06/1973"  Substance and Sexual Activity  . Alcohol use: Yes    Comment: 12/20/2015 'quit in ~ 2012-2013"  . Drug use: No  . Sexual activity: Never

## 2019-08-23 HISTORY — PX: OTHER SURGICAL HISTORY: SHX169

## 2019-08-25 ENCOUNTER — Telehealth: Payer: Self-pay | Admitting: Orthopaedic Surgery

## 2019-08-25 ENCOUNTER — Telehealth: Payer: Self-pay | Admitting: Cardiology

## 2019-08-25 NOTE — Telephone Encounter (Signed)
Patient has questions about when to discontinue his plavix before his upcoming surgery. Please reach out to patient. Thanks!

## 2019-08-25 NOTE — Telephone Encounter (Signed)
   [facility] Pre-operative Risk Assessment    Request for surgical clearance:  1. What type of surgery is being performed? SAD DCR   2. When is this surgery scheduled? 2/11  3. What type of clearance is required (medical clearance vs. Pharmacy clearance to hold med vs. Both)? both  4. Are there any medications that need to be held prior to surgery and how long? Plavix held for 5 days  Practice name and name of physician performing surgery? Dr. Joni Fears, [facility] 5. What is your office phone number: [phone]    7.   What is your office fax number: [phone]  8.   Anesthesia type (None, local, MAC, general) ? Choice   Selena Zobro 08/25/2019, 4:19 PM  _________________________________________________________________   (provider comments below)

## 2019-08-25 NOTE — Telephone Encounter (Signed)
Please advise 

## 2019-08-25 NOTE — Telephone Encounter (Signed)
Error

## 2019-08-25 NOTE — Telephone Encounter (Signed)
He can hold Plavix for 5 days prior to surgery  Dinah Lupa [name] MD, [facility]

## 2019-08-25 NOTE — Telephone Encounter (Signed)
Patient is scheduled at [facility] on 09-02-19 for shoulder surgery.  He is scheduled to take 1st Covid vaccine tomorrow.  He is asking if there will be an issue with timing (taking the vaccine and then having surgery)    cb  [phone]

## 2019-08-25 NOTE — Telephone Encounter (Signed)
Hi Dr. [name],  Kevin Stewart has shoulder surgery (subacromial decompression and distal clavicle resection) scheduled for 09/02/2019. Can he hold Plavix for 5 days prior to procedure? He has a history of CAD s/p stenting to proximal LAD in 2003 and DES for in-stent restenosis in 11/2015. He had ETT in 12/2016 which was low risk and much improved from the one prior to cath. You last saw him for a virtual visit in 03/2019 at which time he was doing well from a cardiac standpoint.  Please route response back to P CV DIV PREOP.  Thank you! Callie

## 2019-08-25 NOTE — Telephone Encounter (Signed)
Tried to call twice. Rings busy. Will try again later.

## 2019-08-25 NOTE — Telephone Encounter (Signed)
Should be OK  but would ask that he check with his primary care physician

## 2019-08-26 NOTE — Telephone Encounter (Signed)
   Primary Cardiologist: Peter [name], MD  Chart reviewed as part of pre-operative protocol coverage. Patient was last seen by Dr. [name] for a telehealth visit in 03/2019 at which time he was doing well. I called and spoke with patient today, and he reports no changes since last visit. No concerning cardiac symptoms. Able to complete >4.0 METS.   Given past medical history and time since last visit, based on ACC/AHA guidelines, Kevin Stewart would be at acceptable risk for the planned procedure without further cardiovascular testing.   Per Dr. [name], [place] to hold Plavix for 5 days prior to surgery. This should be restarted as soon as able following procedure.   I will route this recommendation to the requesting party via Epic fax function and remove from pre-op pool.  Please call with questions.  Darreld Mclean, PA-C 08/26/2019, 11:00 AM

## 2019-09-02 ENCOUNTER — Encounter: Payer: Self-pay | Admitting: Orthopaedic Surgery

## 2019-09-02 ENCOUNTER — Other Ambulatory Visit: Payer: Self-pay | Admitting: Orthopaedic Surgery

## 2019-09-02 ENCOUNTER — Telehealth: Payer: Self-pay

## 2019-09-02 DIAGNOSIS — S46291A Other injury of muscle, fascia and tendon of other parts of biceps, right arm, initial encounter: Secondary | ICD-10-CM | POA: Diagnosis not present

## 2019-09-02 DIAGNOSIS — M7541 Impingement syndrome of right shoulder: Secondary | ICD-10-CM | POA: Diagnosis not present

## 2019-09-02 DIAGNOSIS — S46011A Strain of muscle(s) and tendon(s) of the rotator cuff of right shoulder, initial encounter: Secondary | ICD-10-CM | POA: Diagnosis not present

## 2019-09-02 DIAGNOSIS — M19011 Primary osteoarthritis, right shoulder: Secondary | ICD-10-CM | POA: Diagnosis not present

## 2019-09-02 DIAGNOSIS — G8918 Other acute postprocedural pain: Secondary | ICD-10-CM | POA: Diagnosis not present

## 2019-09-02 DIAGNOSIS — M75121 Complete rotator cuff tear or rupture of right shoulder, not specified as traumatic: Secondary | ICD-10-CM | POA: Diagnosis not present

## 2019-09-02 DIAGNOSIS — M659 Synovitis and tenosynovitis, unspecified: Secondary | ICD-10-CM | POA: Diagnosis not present

## 2019-09-02 DIAGNOSIS — S46121A Laceration of muscle, fascia and tendon of long head of biceps, right arm, initial encounter: Secondary | ICD-10-CM | POA: Diagnosis not present

## 2019-09-02 DIAGNOSIS — M94211 Chondromalacia, right shoulder: Secondary | ICD-10-CM | POA: Diagnosis not present

## 2019-09-02 MED ORDER — OXYCODONE-ACETAMINOPHEN 5-325 MG PO TABS: 1.0000 | ORAL_TABLET | ORAL | 0 refills | Status: DC | PRN

## 2019-09-02 NOTE — Telephone Encounter (Signed)
Bethena Roys (wife of patient) called states patient had SU  And would like to get instructions for after care. Would like to discuss with Dr Durward Fortes.  CB: [phone]

## 2019-09-02 NOTE — Telephone Encounter (Signed)
Dr.Whitfield notified via phone.

## 2019-09-03 ENCOUNTER — Telehealth: Payer: Self-pay | Admitting: Orthopaedic Surgery

## 2019-09-03 NOTE — Telephone Encounter (Signed)
Spoke with patient. He has been advised to keep bandage in place until his follow up visit with [place] in the office. He understands.

## 2019-09-03 NOTE — Telephone Encounter (Signed)
Patient's wife Kevin Stewart called asked if they need to leave the bandage on until patient  is seen on 09/09/2019? Kevin Stewart said she can't get under there to wash under his arm due to the bandage . The number to contact Kevin Stewart is [phone]

## 2019-09-03 NOTE — Telephone Encounter (Signed)
Thanks for calling

## 2019-09-06 ENCOUNTER — Telehealth: Payer: Self-pay | Admitting: Radiology

## 2019-09-06 NOTE — Telephone Encounter (Signed)
Spoke with patient and Dr.Whitfield. Dr.Whitfield would like him to take Tylenol as needed. He also wants him to remove his bandage and check for any drainage. He then wants him to apply a waterproof bandaid and follow up tomorrow. I called patient and relayed information above. He has not ran a fever today and has not had any Tylenol. He is going to come tomorrow at 2pm to see Dr.Whitfield.

## 2019-09-06 NOTE — Telephone Encounter (Signed)
Patient called states that he had surgery on Thursday with Durward Fortes, states that on Saturday evening he had a fever of 100.5 says he took tylenol and it cam back down and everything was fine, then said about the same time on Sunday he checked it again and it was 99.6. He took tylenol again and it came back down.  Wants to know if this is normal or what else he can do?

## 2019-09-07 ENCOUNTER — Other Ambulatory Visit: Payer: Self-pay

## 2019-09-07 ENCOUNTER — Ambulatory Visit (INDEPENDENT_AMBULATORY_CARE_PROVIDER_SITE_OTHER): Payer: Medicare Other | Admitting: Orthopaedic Surgery

## 2019-09-07 ENCOUNTER — Encounter: Payer: Self-pay | Admitting: Orthopaedic Surgery

## 2019-09-07 DIAGNOSIS — M25511 Pain in right shoulder: Secondary | ICD-10-CM

## 2019-09-07 DIAGNOSIS — M75121 Complete rotator cuff tear or rupture of right shoulder, not specified as traumatic: Secondary | ICD-10-CM

## 2019-09-07 DIAGNOSIS — G8929 Other chronic pain: Secondary | ICD-10-CM

## 2019-09-07 NOTE — Progress Notes (Signed)
Office Visit Note   Patient: Kevin Stewart           Date of Birth: 1940-08-15           MRN: [id number] Visit Date: 09/07/2019              Requested by: [name], NP [address] [place],  [place] [address] PCP: [name], NP   Assessment & Plan: Visit Diagnoses:  1. Chronic right shoulder pain   2. Nontraumatic complete tear of right rotator cuff     Plan: 5 days status post rotator cuff tear repair of right shoulder.  Had a temperature yesterday and asked him to come to the office.  Presently doing well with his temperature at only 98.  No shortness of breath or chest pain.  His incisions look just fine without evidence of infection.  No difficulty urinating.  No swelling of his right upper extremity.  Have applied Steri-Strips over benzoin and readjusted his sling.  Start outpatient therapy at [facility] and return to see me in 2 weeks.  No obvious source of infection  Follow-Up Instructions: Return in about 2 weeks (around 09/21/2019).   Orders:  No orders of the defined types were placed in this encounter.  No orders of the defined types were placed in this encounter.     Procedures: No procedures performed   Clinical Data: No additional findings.   Subjective: Chief Complaint  Patient presents with  . Right Shoulder - Routine Post Op    Right shoulder arthroscopy DOS 09/02/2019  Patient presents today for his right shoulder. He had right shoulder arthroscopy surgery on 09/02/2019 and is now 5 days out from surgery. He was running a low grade fever this past weekend that he treated with Tylenol. He states that he feels fine otherwise. He removed the bandage at home yesterday.   HPI  Review of Systems   Objective: Vital Signs: There were no vitals taken for this visit.  Physical Exam  Ortho Exam Right shoulder dressings were removed.  The incisions look just fine without evidence of infection.  New Steri-Strips applied over benzoin.  Resolving  ecchymosis.  Good grip and good release.  Normal sensation.  Developing an early rash in his axilla.  Will use an absorbent pad.  I will allow him out of the sling enough to allow passive abduction and decompress the axilla Specialty Comments:  No specialty comments available.  Imaging: No results found.   PMFS History: Patient Active Problem List   Diagnosis Date Noted  . Complete tear of right rotator cuff 09/07/2019  . Pain in right shoulder 07/27/2019  . Pre-diabetes 12/21/2015  . Abnormal stress test 12/20/2015  . Hypertension   . Coronary artery disease   . Hyperlipemia    Past Medical History:  Diagnosis Date  . Arthritis    "hands" (12/20/2015)  . Basal cell carcinoma, face   . Coronary artery disease    a. 2003 - light MI per patient; s/p Cypher DES to prox LAD in [facility].  Marland Kitchen Hx of adenomatous colonic polyps   . Hyperlipemia   . Hyperlipidemia   . Hypertension   . Kidney stones 1978   "passed them"  . Melanoma of right side of neck ([place])    removed posterior neck 12/312013  . Migraine    "stopped in 2000 when I had my heart attack"  . Myocardial infarction ([place]) 2000  . Pre-diabetes  checks cbg q month  . Rotator cuff tear, right   . Wears dentures    upper-lower partial  . Wears glasses     Family History  Problem Relation Age of Onset  . Heart attack Mother 11  . Cancer - Other Father 44       pancreatic  . Heart attack Brother 57  . Cancer - Colon Brother 63  . Cancer - Other Sister 84    Past Surgical History:  Procedure Laterality Date  . CARDIAC CATHETERIZATION N/A 12/20/2015   Procedure: Left Heart Cath and Coronary Angiography;  Surgeon: Belva Crome, MD;  Location: [place] [facility];  Service: Cardiovascular;  Laterality: N/A;  . CARDIAC CATHETERIZATION N/A 12/20/2015   Procedure: Coronary Stent Intervention;  Surgeon: Belva Crome, MD;  Location: [place] [facility];  Service: Cardiovascular;  Laterality: N/A;  . CATARACT EXTRACTION  W/ INTRAOCULAR LENS  IMPLANT, BILATERAL Bilateral 2011  . CIRCUMCISION  age 12  . COLONOSCOPY    . CORONARY ANGIOPLASTY WITH STENT PLACEMENT  2000   "1 stent"  . CORONARY ANGIOPLASTY WITH STENT PLACEMENT  12/20/2015   "1 stent"  . CYSTOSCOPY/URETEROSCOPY/HOLMIUM LASER/STENT PLACEMENT Right 08/03/2019   Procedure: CYSTOSCOPY/RETROGRADE/URETEROSCOPY//STENT PLACEMENT;  Surgeon: Ceasar Mons, MD;  Location[facility];  Service: Urology;  Laterality: Right;  . HEMORRHOID SURGERY     "burned them off"  . MELANOMA EXCISION WITH SENTINEL LYMPH NODE BIOPSY  07/21/2012   Procedure: MELANOMA EXCISION WITH SENTINEL LYMPH NODE BIOPSY;  Surgeon: Rozetta Nunnery, MD;  Location: [place];  Service: General;  Laterality: N/A;  melanoma excision with sentinel node biopsy   posterior right neck  . TONSILLECTOMY  1961   Social History   Occupational History  . 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 Smoking status: Former Smoker    Packs/day: 2.00    Years: 10.00    Pack years: 20.00    Types: Cigarettes    Quit date: 07/10/1973    Years since quitting: 46.1  . Smokeless tobacco: Former Systems developer    Types: Chew  . Tobacco comment: "chewed a little; stopped in 06/1973"  Substance and Sexual Activity  . Alcohol use: Yes    Comment: 12/20/2015 'quit in ~ 2012-2013"  . Drug use: No  . Sexual activity: Never

## 2019-09-09 ENCOUNTER — Inpatient Hospital Stay: Payer: Medicare Other | Admitting: Orthopaedic Surgery

## 2019-09-13 ENCOUNTER — Ambulatory Visit: Payer: Medicare Other | Attending: Orthopaedic Surgery | Admitting: Physical Therapy

## 2019-09-13 ENCOUNTER — Encounter: Payer: Self-pay | Admitting: Physical Therapy

## 2019-09-13 ENCOUNTER — Other Ambulatory Visit: Payer: Self-pay

## 2019-09-13 DIAGNOSIS — G8929 Other chronic pain: Secondary | ICD-10-CM | POA: Insufficient documentation

## 2019-09-13 DIAGNOSIS — M6281 Muscle weakness (generalized): Secondary | ICD-10-CM | POA: Insufficient documentation

## 2019-09-13 DIAGNOSIS — M25611 Stiffness of right shoulder, not elsewhere classified: Secondary | ICD-10-CM | POA: Diagnosis not present

## 2019-09-13 DIAGNOSIS — M25511 Pain in right shoulder: Secondary | ICD-10-CM | POA: Diagnosis not present

## 2019-09-13 NOTE — Therapy (Signed)
[facility] [address] [place], [address] Phone: [phone]   Fax:  [phone]  Physical Therapy Evaluation  Patient Details  Name: Kevin Stewart MRN: [id number] Date of Birth: 1941/06/27 Referring Provider (PT): Garald Balding, MD   Encounter Date: 09/13/2019  PT End of Session - 09/13/19 1709    Visit Number  1    Number of Visits  24    Date for PT Re-Evaluation  12/06/19    Authorization Type  [employer] medicare reporting period from 09/13/2019    Authorization - Visit Number  1    Authorization - Number of Visits  10    PT Start Time  1525    PT Stop Time  1620    PT Time Calculation (min)  55 min    Activity Tolerance  Patient tolerated treatment well    Behavior During Therapy  [facility] for tasks assessed/performed       Past Medical History:  Diagnosis Date  . Arthritis    "hands" (12/20/2015)  . Basal cell carcinoma, face   . Coronary artery disease    a. 2003 - light MI per patient; s/p Cypher DES to prox LAD in [facility].  Marland Kitchen Hx of adenomatous colonic polyps   . Hyperlipemia   . Hyperlipidemia   . Hypertension   . Kidney stones 1978   "passed them"  . Melanoma of right side of neck ([place])    removed posterior neck 12/312013  . Migraine    "stopped in 2000 when I had my heart attack"  . Myocardial infarction ([place]) 2000  . Pre-diabetes    checks cbg q month  . Rotator cuff tear, right   . Wears dentures    upper-lower partial  . Wears glasses     Past Surgical History:  Procedure Laterality Date  . CARDIAC CATHETERIZATION N/A 12/20/2015   Procedure: Left Heart Cath and Coronary Angiography;  Surgeon: Belva Crome, MD;  Location: [facility];  Service: Cardiovascular;  Laterality: N/A;  . CARDIAC CATHETERIZATION N/A 12/20/2015   Procedure: Coronary Stent Intervention;  Surgeon: Belva Crome, MD;  Location: [facility];  Service: Cardiovascular;  Laterality: N/A;   . CATARACT EXTRACTION W/ INTRAOCULAR LENS  IMPLANT, BILATERAL Bilateral 2011  . CIRCUMCISION  age 17  . COLONOSCOPY    . CORONARY ANGIOPLASTY WITH STENT PLACEMENT  2000   "1 stent"  . CORONARY ANGIOPLASTY WITH STENT PLACEMENT  12/20/2015   "1 stent"  . CYSTOSCOPY/URETEROSCOPY/HOLMIUM LASER/STENT PLACEMENT Right 08/03/2019   Procedure: CYSTOSCOPY/RETROGRADE/URETEROSCOPY//STENT PLACEMENT;  Surgeon: Ceasar Mons, MD;  Location: [facility];  Service: Urology;  Laterality: Right;  . HEMORRHOID SURGERY     "burned them off"  . MELANOMA EXCISION WITH SENTINEL LYMPH NODE BIOPSY  07/21/2012   Procedure: MELANOMA EXCISION WITH SENTINEL LYMPH NODE BIOPSY;  Surgeon: Rozetta Nunnery, MD;  Location: [facility];  Service: General;  Laterality: N/A;  melanoma excision with sentinel node biopsy   posterior right neck  . TONSILLECTOMY  1961    There were no vitals filed for this visit.   Subjective Assessment - 09/13/19 1553    Subjective  Patient states condition started over the last 3 years at least. He recently retired from [employer] after working there 20 years. He said his shoulder started hurting over time and got worse over the years until he could not lift  and move things like he needed to (did a lot of that over the years at work) and had it checked out. He was found to have a R rotator cuff tear and it was subsequently operated on 09/02/2019 without issue. He states that since the surgery every day is an improvement. Doctor told him to keep the sling on and not fall.    Pertinent History  Patient is a 79 y.o. male who presents to outpatient physical therapy with a referral for medical diagnosis chonic R shoulder pain, nontraumatic complete tear of R rotator cuff. This patient's chief complaints consist of R shoulder pain, stiffness, weakness, surgical precautions s/p R RTC repair 09/02/2019 leading to the following functional  deficits: inability to lift or use R UE for activities such as reaching, lifting, carrying, stabilizing, pushing, pulling, grooming, ADLs, IADLs, yardwork, farmwork, community and social activity. Relevant past medical history and comorbidities include hx of MI (2000) with stent placement x2 (2017, 2000), skin cancer, carcinoma, melanoma (removed 6 years ago), pre-diabetes, HTN, arthritis, kidney stones, cataract implants, hx of adenomatous colonic polyps. Denies lung problem, seizures, stroke.   difficulty with push leaf blower, difficulty sleeping, any use of the R arm (ADLs, IADLs, social and community activities, pushing, pulling, stabilizing, lifting, carrying, yardwork, brushhogging, using tractor, etc).   Limitations  House hold activities;Lifting;Other (comment)    Diagnostic tests  Pre-Op MRI report 08/17/2019: "IMPRESSION:1. Supraspinatus tendinosis with full-thickness non-retracted tearof the anterior tendon fibers in the region of the critical zone.2. Subscapularis tendinosis with partial-thickness tearing.3. Intra-articular biceps tendinosis with partial tearing.4. Glenohumeral and acromioclavicular osteoarthritis."    Patient Stated Goals  get better    Currently in Pain?  Yes    Pain Score  0-No pain   worst: 3-4/10; best: 0/10   Pain Location  Shoulder    Pain Orientation  Right    Pain Descriptors / Indicators  Aching    Pain Type  Surgical pain    Pain Radiating Towards  some tingling and numbness from the block in hs arm. It occaisonally has some tingling but it quits if he squeezes his hands.    Pain Onset  1 to 4 weeks ago    Pain Frequency  Intermittent    Aggravating Factors   between 6-8 pm, bumping it    Pain Relieving Factors  tylenol,    Effect of Pain on Daily Activities  Functional deficits: difficulty with push leaf blower, difficulty sleeping, any use of the R arm (ADLs, IADLs, social and community activities, pushing, pulling, stabilizing, lifting, carrying, etc).          [facility] PT Assessment - 09/13/19 0001      Assessment   Medical Diagnosis  R shoulder pain, nontraumatic complete tear of R rotator cuff    Referring Provider (PT)  Garald Balding, MD    Onset Date/Surgical Date  09/02/19    Hand Dominance  Right    Next MD Visit  09/23/2019    Prior Therapy  none for this condition prior to current episode of care      Precautions   Precautions  Other (comment)    Precaution Comments  PROM per referral    Required Braces or Orthoses  Sling   R shoulder     Balance Screen   Has the patient fallen in the past 6 months  Yes    How many times?  1-2   usually a slip or not pay attention to where I step  Has the patient had a decrease in activity level because of a fear of falling?   No    Is the patient reluctant to leave their home because of a fear of falling?   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 Posture: forward head, rounded shoulders, slumped in sitting.  . Tremor: R hand with fine motor tasks.  . Patient wearing R sling without adductor wedge upon arrival.   . Bed mobility:  Supine <> sit mod I with increased time to protect R arm . Transfers: sit <> stand I  . Gait: grossly WFL for household and short community ambulation. More detailed gait analysis deferred to later date as needed.   NEUROLOGICAL Dermatomes . C2-T1 appears equal and intact to light touch.   PERIPHERAL JOINT MOTION (in degrees) *Indicates pain, R/L 09/13/19    Joint/Motion AROM PROM Comments  Shoulder     Flexion /140 110*/150   Extension /72 /   Abduction /140 83*/175   External rotation /35 50/85 R PROM, L AROM at neutral position  Internal rotation /T6 Tissue approx/85 R at neutral position  Comments: AROM at R shoulder deferred due to post-op precautions. B elbow and wrist AROM WFL. Slight lack of elbow extension B.   MUSCLE PERFORMANCE (MMT):  *Indicates pain 09/13/2019 Date Date  Joint/Motion R/L R/L R/L  Shoulder     Flexion /5 / /  Extension / / /  Abduction /5 / /  External rotation /4 / /  Internal rotation /4+ / /  Elbow     Flexion /5 / /  Extension /5 / /  Hand     Grip WFL R >L / /  Comments: R shoulder and elbow MMT deferred due to post-op precautions.   ACCESSORY MOTION:  - Deferred due to recent surgery  PALPATION: - TTP at anterior R shoulder and over three incision sites.   EDUCATION/COGNITION: Patient is alert and oriented X 4.  Objective measurements completed on examination: See above findings.     TREATMENT:   Therapeutic exercise: to centralize symptoms and improve ROM, strength, muscular endurance, and activity tolerance required for successful completion of functional activities.  - R shoulder pendulum, x 10 each direction (flex/ext, horizontal abd/add, circles CW/CCW).  - positioning for sleeping with pillow under elbow and behind R arm.  - isometric grip R hand on towel, x 5, 5 seconds -  Education on diagnosis, prognosis, POC, anatomy and physiology of current condition.  - Education on HEP including handout    HOME EXERCISE  PROGRAM Access Code: [email or web address]  URL: [email or web address]  Date: 09/13/2019  Prepared by: Rosita Kea   Exercises Flexion-Extension Shoulder Pendulum with Table Support - 20 reps - 3x daily Horizontal Shoulder Pendulum with Table Support - 20 reps - 3x daily Circular Shoulder Pendulum with Table Support - 20 reps - 3x daily Seated Gripping Towel - 20 reps - 5 hold - 3x daily    PT Education - 09/13/19 1646    Education Details  Exercise purpose/form. Self management techniques. Education on diagnosis, prognosis, POC, anatomy and physiology of current condition Education on HEP including handout    Person(s) Educated  Patient    Methods  Explanation;Demonstration;Tactile cues;Verbal cues;Handout    Comprehension  Verbalized understanding;Returned demonstration;Verbal cues required;Tactile cues required;Need further instruction       PT Short Term Goals - 09/13/19 1700      PT SHORT TERM GOAL #1   Title  Be independent with initial home exercise program for self-management of symptoms.    Baseline  Initial HEP provided at IE (09/13/2019);    Time  2    Period  Weeks    Status  New    Target Date  09/27/19        PT Long Term Goals - 09/13/19 1701      PT LONG TERM GOAL #1   Title  Be independent with a long-term home exercise program for self-management of symptoms.    Baseline  Initial HEP provided at IE (09/13/2019);    Time  12    Period  Weeks    Status  New    Target Date  12/06/19      PT LONG TERM GOAL #2   Title  Demonstrate improved FOTO score to equal or greater than 65 to demonstrate improvement in overall condition and self-reported functional ability.    Baseline  FOTO = 43 (09/13/2019);    Time  12    Period  Weeks    Status  New    Target Date  12/06/19      PT LONG TERM GOAL #3   Title  Have R shoulder AROM equal or greater than L shoulder AROM with no compensations or increase in pain in all planes except intermittent end range  discomfort to allow patient to complete valued activities with less difficulty.    Baseline  see objective exam, AROM deferred due to post-op precautions (09/13/2019);    Time  12    Period  Weeks    Status  New    Target Date  12/06/19      PT LONG TERM GOAL #4   Title  Improve R shoulder strength to 4+/5 for improved ability to allow patient to complete valued functional tasks such as reaching overhead and yardwork with less difficulty.    Baseline  testing deferred due to post-op precautions (09/13/2019);    Time  12    Period  Weeks    Status  New    Target Date  12/06/19      PT LONG TERM GOAL #5   Title  Complete community, work and/or recreational activities without limitation due to current condition.    Baseline  inability to lift or use R UE for activities such as reaching, lifting, carrying, stabilizing, pushing, pulling, grooming, ADLs, IADLs, yardwork, farmwork, community and social activity (09/13/2019);  Time  12    Period  Weeks    Status  New    Target Date  12/06/19             Plan - 09/13/19 1655    Clinical Impression Statement  Patient is a 79 y.o. male referred to outpatient physical therapy with a medical diagnosis of chonic R shoulder pain, nontraumatic complete tear of R rotator cuff who presents with signs and symptoms consistent with R shoulder pain, stiffness, weakness 2/2 R shoulder RTC repair 09/02/2019. Patient presents with significant pain, stiffness, muscle performance (strength/power/endurance), coordination, motor control, muscle spasm, ROM impairments that are limiting ability to complete usual activities including anything that requires him to lift or use R UE for activities such as reaching, lifting, carrying, stabilizing, pushing, pulling, grooming, ADLs, IADLs, yardwork, farmwork, community and social activity without difficulty. Patient will benefit from skilled physical therapy intervention to address current body structure impairments and  activity limitations to improve function and work towards goals set in current POC in order to return to prior level of function or maximal functional improvement.    Personal Factors and Comorbidities  Age;Behavior Pattern;Comorbidity 3+;Education;Social Background;Time since onset of injury/illness/exacerbation;Past/Current Experience;Fitness    Comorbidities  hx of MI (2000) with stent placement x2 (2017, 2000), skin cancer, carcinoma, melanoma (removed 6 years ago), pre-diabetes, HTN, arthritis, kidney stones, cataract implants, hx of adenomatous colonic polyps. Denies lung problem, seizures, stroke    Examination-Activity Limitations  Bed Mobility;Bathing;Reach Overhead;Self Feeding;Carry;Dressing;Hygiene/Grooming;Sleep;Lift;Toileting    Examination-Participation Restrictions  Driving;Community Activity;Cleaning;Yard Work;Meal Prep;Other   inability to lift or use R UE for activities such as reaching, lifting, carrying, stabilizing, pushing, pulling, grooming, ADLs, IADLs, yardwork, farmwork, community and social activity   Stability/Clinical Decision Making  Stable/Uncomplicated    Clinical Decision Making  Low    Rehab Potential  Good    PT Frequency  2x / week    PT Duration  12 weeks    PT Treatment/Interventions  ADLs/Self Care Home Management;Aquatic Therapy;Electrical Stimulation;Cryotherapy;Moist Heat;DME Instruction;Therapeutic activities;Therapeutic exercise;Balance training;Neuromuscular re-education;Patient/family education;Manual techniques;Compression bandaging;Scar mobilization;Dry needling;Passive range of motion;Joint Manipulations;Spinal Manipulations    PT Next Visit Plan  PROM, updated HEP    PT Home Exercise Plan  Medbridge Access Code: [email or web address]    Consulted and Agree with Plan of Care  Patient       Patient will benefit from skilled therapeutic intervention in order to improve the following deficits and impairments:  Decreased knowledge of use of DME, Decreased skin  integrity, Increased fascial restricitons, Pain, Increased muscle spasms, Decreased scar mobility, Decreased coordination, Decreased activity tolerance, Decreased endurance, Decreased range of motion, Decreased strength, Hypomobility, Impaired perceived functional ability, Impaired UE functional use, Impaired flexibility, Increased edema, Decreased safety awareness, Decreased knowledge of precautions  Visit Diagnosis: Chronic right shoulder pain  Stiffness of right shoulder, not elsewhere classified  Muscle weakness (generalized)     Problem List Patient Active Problem List   Diagnosis Date Noted  . Complete tear of right rotator cuff 09/07/2019  . Pain in right shoulder 07/27/2019  . Pre-diabetes 12/21/2015  . Abnormal stress test 12/20/2015  . Hypertension   . Coronary artery disease   . Hyperlipemia     Everlean Alstrom. Graylon Good, PT, DPT 09/13/19, 5:11 PM  [facility] [address], [place], [address] Phone: [phone]   Fax:  [phone]  Name: Kevin Stewart MRN: [id number] Date of Birth: Feb 25, 1941

## 2019-09-14 ENCOUNTER — Telehealth: Payer: Self-pay | Admitting: Physical Therapy

## 2019-09-14 NOTE — Telephone Encounter (Signed)
Called referring physician's office to request copy of Op note and inquire if Dr. Durward Fortes has a preferred rehab protocol. Requested they fax it to [place] at [phone] if available. Left call back number: [place]. Graylon Good, PT, DPT 09/14/19, 9:29 AM

## 2019-09-15 ENCOUNTER — Telehealth: Payer: Self-pay | Admitting: *Deleted

## 2019-09-15 ENCOUNTER — Other Ambulatory Visit: Payer: Self-pay

## 2019-09-15 ENCOUNTER — Ambulatory Visit: Payer: Medicare Other | Admitting: Physical Therapy

## 2019-09-15 ENCOUNTER — Encounter: Payer: Self-pay | Admitting: Physical Therapy

## 2019-09-15 DIAGNOSIS — M25511 Pain in right shoulder: Secondary | ICD-10-CM | POA: Diagnosis not present

## 2019-09-15 DIAGNOSIS — M6281 Muscle weakness (generalized): Secondary | ICD-10-CM

## 2019-09-15 DIAGNOSIS — G8929 Other chronic pain: Secondary | ICD-10-CM | POA: Diagnosis not present

## 2019-09-15 DIAGNOSIS — M25611 Stiffness of right shoulder, not elsewhere classified: Secondary | ICD-10-CM

## 2019-09-15 NOTE — Therapy (Signed)
[place] [facility] [address] [place], [address] Phone: [phone]   Fax:  [phone]  Physical Therapy Treatment  Patient Details  Name: Kevin Stewart MRN: [id number] Date of Birth: January 13, 1941 Referring Provider (PT): Garald Balding, MD   Encounter Date: 09/15/2019  PT End of Session - 09/15/19 1855    Visit Number  2    Number of Visits  24    Date for PT Re-Evaluation  12/06/19    Authorization Type  [employer] medicare reporting period from 09/13/2019    Authorization - Visit Number  2    Authorization - Number of Visits  10    PT Start Time  0902    PT Stop Time  0942    PT Time Calculation (min)  40 min    Activity Tolerance  Patient tolerated treatment well    Behavior During Therapy  [facility] for tasks assessed/performed       Past Medical History:  Diagnosis Date  . Arthritis    "hands" (12/20/2015)  . Basal cell carcinoma, face   . Coronary artery disease    a. 2003 - light MI per patient; s/p Cypher DES to prox LAD in [facility].  Marland Kitchen Hx of adenomatous colonic polyps   . Hyperlipemia   . Hyperlipidemia   . Hypertension   . Kidney stones 1978   "passed them"  . Melanoma of right side of neck ([place])    removed posterior neck 12/312013  . Migraine    "stopped in 2000 when I had my heart attack"  . Myocardial infarction ([place]) 2000  . Pre-diabetes    checks cbg q month  . Rotator cuff tear, right   . Wears dentures    upper-lower partial  . Wears glasses     Past Surgical History:  Procedure Laterality Date  . CARDIAC CATHETERIZATION N/A 12/20/2015   Procedure: Left Heart Cath and Coronary Angiography;  Surgeon: Belva Crome, MD;  Location: [place] [facility];  Service: Cardiovascular;  Laterality: N/A;  . CARDIAC CATHETERIZATION N/A 12/20/2015   Procedure: Coronary Stent Intervention;  Surgeon: Belva Crome, MD;  Location: [place] [facility];  Service: Cardiovascular;  Laterality: N/A;  .  CATARACT EXTRACTION W/ INTRAOCULAR LENS  IMPLANT, BILATERAL Bilateral 2011  . CIRCUMCISION  age 74  . COLONOSCOPY    . CORONARY ANGIOPLASTY WITH STENT PLACEMENT  2000   "1 stent"  . CORONARY ANGIOPLASTY WITH STENT PLACEMENT  12/20/2015   "1 stent"  . CYSTOSCOPY/URETEROSCOPY/HOLMIUM LASER/STENT PLACEMENT Right 08/03/2019   Procedure: CYSTOSCOPY/RETROGRADE/URETEROSCOPY//STENT PLACEMENT;  Surgeon: Ceasar Mons, MD;  Location: [facility];  Service: Urology;  Laterality: Right;  . HEMORRHOID SURGERY     "burned them off"  . MELANOMA EXCISION WITH SENTINEL LYMPH NODE BIOPSY  07/21/2012   Procedure: MELANOMA EXCISION WITH SENTINEL LYMPH NODE BIOPSY;  Surgeon: Rozetta Nunnery, MD;  Location: [place];  Service: General;  Laterality: N/A;  melanoma excision with sentinel node biopsy   posterior right neck  . TONSILLECTOMY  1961    There were no vitals filed for this visit.  Subjective Assessment - 09/15/19 1854    Subjective  Patient reports minimal to no pain at rest upon arrival. Arrives in his sling. Reports the exercises have been going well and he found a better way to sleep. Reports no excessive discomfort folloiwng last treatment session.    Pertinent History  Patient is a  79 y.o. male who presents to outpatient physical therapy with a referral for medical diagnosis chonic R shoulder pain, nontraumatic complete tear of R rotator cuff. This patient's chief complaints consist of R shoulder pain, stiffness, weakness, surgical precautions s/p R RTC repair 09/02/2019 leading to the following functional deficits: inability to lift or use R UE for activities such as reaching, lifting, carrying, stabilizing, pushing, pulling, grooming, ADLs, IADLs, yardwork, farmwork, community and social activity. Relevant past medical history and comorbidities include hx of MI (2000) with stent placement x2 (2017, 2000), skin cancer, carcinoma, melanoma (removed 6 years  ago), pre-diabetes, HTN, arthritis, kidney stones, cataract implants, hx of adenomatous colonic polyps. Denies lung problem, seizures, stroke.   difficulty with push leaf blower, difficulty sleeping, any use of the R arm (ADLs, IADLs, social and community activities, pushing, pulling, stabilizing, lifting, carrying, yardwork, brushhogging, using tractor, etc).   Limitations  House hold activities;Lifting;Other (comment)    Diagnostic tests  Pre-Op MRI report 08/17/2019: "IMPRESSION:1. Supraspinatus tendinosis with full-thickness non-retracted tearof the anterior tendon fibers in the region of the critical zone.2. Subscapularis tendinosis with partial-thickness tearing.3. Intra-articular biceps tendinosis with partial tearing.4. Glenohumeral and acromioclavicular osteoarthritis."    Patient Stated Goals  get better    Currently in Pain?  No/denies    Pain Onset  1 to 4 weeks ago      TREATMENT:   Therapeutic exercise:to centralize symptoms and improve ROM, strength, muscular endurance, and activity tolerance required for successful completion of functional activities.  - R shoulder pendulum, x 10 each direction (flex/ext, horizontal abd/add, circles CW/CCW) to check form. Pt shifting weight well but still unable to relax arm.  - sidelying D2 and D1 R scapular AAROM to manually resisted motion x 20-40 total each direction.  - seated scapular circles front and back x 20 each way - Education on HEP including handout   Manual therapy: to reduce pain and tissue tension, improve range of motion, neuromodulation, in order to promote improved ability to complete functional activities. - supine PROM as tolerated R shoulder flexion, abduction, ER, R elbow x extension. X 20 each direction.  - gentle R shoulder distraction to decrease tension.   HOME EXERCISE PROGRAM Access Code: [email or web address]  URL: [email or web address]  Date: 09/15/2019  Prepared by: Rosita Kea    Exercises Flexion-Extension Shoulder Pendulum with Table Support - 20 reps - 3x daily Horizontal Shoulder Pendulum with Table Support - 20 reps - 3x daily Circular Shoulder Pendulum with Table Support - 20 reps - 3x daily Seated Gripping Towel - 20 reps - 5 hold - 3x daily Standing Shoulder Shrug Circles AROM Backward - 20 reps - 2-3x daily Standing Shoulder Shrug Circles AROM Forward - 20 reps - 2-3x daily    PT Education - 09/15/19 1855    Education Details  Exercise purpose/form. Self management techniques.    Person(s) Educated  Patient    Methods  Explanation;Demonstration;Tactile cues;Verbal cues;Handout    Comprehension  Verbalized understanding;Returned demonstration;Verbal cues required;Tactile cues required;Need further instruction       PT Short Term Goals - 09/13/19 1700      PT SHORT TERM GOAL #1   Title  Be independent with initial home exercise program for self-management of symptoms.    Baseline  Initial HEP provided at IE (09/13/2019);    Time  2    Period  Weeks    Status  New    Target Date  09/27/19        PT Long Term Goals -  09/13/19 1701      PT LONG TERM GOAL #1   Title  Be independent with a long-term home exercise program for self-management of symptoms.    Baseline  Initial HEP provided at IE (09/13/2019);    Time  12    Period  Weeks    Status  New    Target Date  12/06/19      PT LONG TERM GOAL #2   Title  Demonstrate improved FOTO score to equal or greater than 65 to demonstrate improvement in overall condition and self-reported functional ability.    Baseline  FOTO = 43 (09/13/2019);    Time  12    Period  Weeks    Status  New    Target Date  12/06/19      PT LONG TERM GOAL #3   Title  Have R shoulder AROM equal or greater than L shoulder AROM with no compensations or increase in pain in all planes except intermittent end range discomfort to allow patient to complete valued activities with less difficulty.    Baseline  see objective  exam, AROM deferred due to post-op precautions (09/13/2019);    Time  12    Period  Weeks    Status  New    Target Date  12/06/19      PT LONG TERM GOAL #4   Title  Improve R shoulder strength to 4+/5 for improved ability to allow patient to complete valued functional tasks such as reaching overhead and yardwork with less difficulty.    Baseline  testing deferred due to post-op precautions (09/13/2019);    Time  12    Period  Weeks    Status  New    Target Date  12/06/19      PT LONG TERM GOAL #5   Title  Complete community, work and/or recreational activities without limitation due to current condition.    Baseline  inability to lift or use R UE for activities such as reaching, lifting, carrying, stabilizing, pushing, pulling, grooming, ADLs, IADLs, yardwork, farmwork, community and social activity (09/13/2019);    Time  12    Period  Weeks    Status  New    Target Date  12/06/19            Plan - 09/15/19 1859    Clinical Impression Statement  Patient tolerated treatment well and is making appropriate progress towards goals. Improved PROM tolerance this session. Introduced scapular strength and motor control activities. Had difficulty isolating scapular motions but improved with cuing and practice. Patient would benefit from continued management of limiting condition by skilled physical therapist to address remaining impairments and functional limitations to work towards stated goals and return to PLOF or maximal functional independence.    Personal Factors and Comorbidities  Age;Behavior Pattern;Comorbidity 3+;Education;Social Background;Time since onset of injury/illness/exacerbation;Past/Current Experience;Fitness    Comorbidities  hx of MI (2000) with stent placement x2 (2017, 2000), skin cancer, carcinoma, melanoma (removed 6 years ago), pre-diabetes, HTN, arthritis, kidney stones, cataract implants, hx of adenomatous colonic polyps. Denies lung problem, seizures, stroke     Examination-Activity Limitations  Bed Mobility;Bathing;Reach Overhead;Self Feeding;Carry;Dressing;Hygiene/Grooming;Sleep;Lift;Toileting    Examination-Participation Restrictions  Driving;Community Activity;Cleaning;Yard Work;Meal Prep;Other   inability to lift or use R UE for activities such as reaching, lifting, carrying, stabilizing, pushing, pulling, grooming, ADLs, IADLs, yardwork, farmwork, community and social activity   Stability/Clinical Decision Making  Stable/Uncomplicated    Rehab Potential  Good    PT Frequency  2x /  week    PT Duration  12 weeks    PT Treatment/Interventions  ADLs/Self Care Home Management;Aquatic Therapy;Electrical Stimulation;Cryotherapy;Moist Heat;DME Instruction;Therapeutic activities;Therapeutic exercise;Balance training;Neuromuscular re-education;Patient/family education;Manual techniques;Compression bandaging;Scar mobilization;Dry needling;Passive range of motion;Joint Manipulations;Spinal Manipulations    PT Next Visit Plan  PROM, updated HEP    PT Home Exercise Plan  Medbridge Access Code: [email or web address]    Consulted and Agree with Plan of Care  Patient       Patient will benefit from skilled therapeutic intervention in order to improve the following deficits and impairments:  Decreased knowledge of use of DME, Decreased skin integrity, Increased fascial restricitons, Pain, Increased muscle spasms, Decreased scar mobility, Decreased coordination, Decreased activity tolerance, Decreased endurance, Decreased range of motion, Decreased strength, Hypomobility, Impaired perceived functional ability, Impaired UE functional use, Impaired flexibility, Increased edema, Decreased safety awareness, Decreased knowledge of precautions  Visit Diagnosis: Chronic right shoulder pain  Stiffness of right shoulder, not elsewhere classified  Muscle weakness (generalized)     Problem List Patient Active Problem List   Diagnosis Date Noted  . Complete tear of right rotator  cuff 09/07/2019  . Pain in right shoulder 07/27/2019  . Pre-diabetes 12/21/2015  . Abnormal stress test 12/20/2015  . Hypertension   . Coronary artery disease   . Hyperlipemia     Everlean Alstrom. Graylon Good, PT, DPT 09/15/19, 7:00 PM  [place] [facility] [address], [place], [address] Phone: [phone]   Fax:  [phone]  Name: Kevin Stewart MRN: [id number] Date of Birth: 27-May-1941

## 2019-09-15 NOTE — Telephone Encounter (Signed)
[facility] rehab is requesting copy of Op note and if Dr. Durward Fortes has a preferred rehab protocol. Please fax it to  [phone] if available.  Left call back number: [phone]

## 2019-09-15 NOTE — Telephone Encounter (Signed)
Faxed to [facility].

## 2019-09-21 ENCOUNTER — Ambulatory Visit: Payer: Medicare Other | Attending: Orthopaedic Surgery | Admitting: Physical Therapy

## 2019-09-21 ENCOUNTER — Encounter: Payer: Self-pay | Admitting: Physical Therapy

## 2019-09-21 ENCOUNTER — Other Ambulatory Visit: Payer: Self-pay

## 2019-09-21 DIAGNOSIS — M25511 Pain in right shoulder: Secondary | ICD-10-CM | POA: Diagnosis not present

## 2019-09-21 DIAGNOSIS — M25611 Stiffness of right shoulder, not elsewhere classified: Secondary | ICD-10-CM | POA: Diagnosis not present

## 2019-09-21 DIAGNOSIS — G8929 Other chronic pain: Secondary | ICD-10-CM | POA: Insufficient documentation

## 2019-09-21 DIAGNOSIS — M6281 Muscle weakness (generalized): Secondary | ICD-10-CM | POA: Diagnosis not present

## 2019-09-21 NOTE — Therapy (Signed)
[facility] [address] [place], [address] Phone: [phone]   Fax:  [phone]  Physical Therapy Treatment  Patient Details  Name: Kevin Stewart MRN: [id number] Date of Birth: [birth year] Referring Provider (PT): Garald Balding, MD   Encounter Date: 09/21/2019  PT End of Session - 09/22/19 1957    Visit Number  3    Number of Visits  24    Date for PT Re-Evaluation  12/06/19    Authorization Type  [employer] medicare reporting period from 09/13/2019    Authorization - Visit Number  3    Authorization - Number of Visits  10    PT Start Time  [id number]    PT Stop Time  1428    PT Time Calculation (min)  40 min    Activity Tolerance  Patient tolerated treatment well    Behavior During Therapy  [facility] for tasks assessed/performed       Past Medical History:  Diagnosis Date  . Arthritis    "hands" (12/20/2015)  . Basal cell carcinoma, face   . Coronary artery disease    a. 2003 - light MI per patient; s/p Cypher DES to prox LAD in [facility].  Marland Kitchen Hx of adenomatous colonic polyps   . Hyperlipemia   . Hyperlipidemia   . Hypertension   . Kidney stones 1978   "passed them"  . Melanoma of right side of neck ([place])    removed posterior neck 12/312013  . Migraine    "stopped in 2000 when I had my heart attack"  . Myocardial infarction ([place]) 2000  . Pre-diabetes    checks cbg q month  . Rotator cuff tear, right   . Wears dentures    upper-lower partial  . Wears glasses     Past Surgical History:  Procedure Laterality Date  . CARDIAC CATHETERIZATION N/A 12/20/2015   Procedure: Left Heart Cath and Coronary Angiography;  Surgeon: Belva Crome, MD;  Location: [place] [facility];  Service: Cardiovascular;  Laterality: N/A;  . CARDIAC CATHETERIZATION N/A 12/20/2015   Procedure: Coronary Stent Intervention;  Surgeon: Belva Crome, MD;  Location: [place] [facility];  Service: Cardiovascular;  Laterality: N/A;  .  CATARACT EXTRACTION W/ INTRAOCULAR LENS  IMPLANT, BILATERAL Bilateral 2011  . CIRCUMCISION  age 41  . COLONOSCOPY    . CORONARY ANGIOPLASTY WITH STENT PLACEMENT  2000   "1 stent"  . CORONARY ANGIOPLASTY WITH STENT PLACEMENT  12/20/2015   "1 stent"  . CYSTOSCOPY/URETEROSCOPY/HOLMIUM LASER/STENT PLACEMENT Right 08/03/2019   Procedure: CYSTOSCOPY/RETROGRADE/URETEROSCOPY//STENT PLACEMENT;  Surgeon: Ceasar Mons, MD;  Location: [facility];  Service: Urology;  Laterality: Right;  . HEMORRHOID SURGERY     "burned them off"  . MELANOMA EXCISION WITH SENTINEL LYMPH NODE BIOPSY  07/21/2012   Procedure: MELANOMA EXCISION WITH SENTINEL LYMPH NODE BIOPSY;  Surgeon: Rozetta Nunnery, MD;  Location: [place];  Service: General;  Laterality: N/A;  melanoma excision with sentinel node biopsy   posterior right neck  . TONSILLECTOMY  1961    There were no vitals filed for this visit.  Subjective Assessment - 09/21/19 1351    Subjective  Patient reports he feels like he is doing well and that his shoulder is improving. He has pain when he accidently moves his R shoulder or bumps his arm. usually 1-3/10 pain. Doing HEP.    Pertinent History  Patient is a 79 y.o. male  who presents to outpatient physical therapy with a referral for medical diagnosis chonic R shoulder pain, nontraumatic complete tear of R rotator cuff. This patient's chief complaints consist of R shoulder pain, stiffness, weakness, surgical precautions s/p R RTC repair 09/02/2019 leading to the following functional deficits: inability to lift or use R UE for activities such as reaching, lifting, carrying, stabilizing, pushing, pulling, grooming, ADLs, IADLs, yardwork, farmwork, community and social activity. Relevant past medical history and comorbidities include hx of MI (2000) with stent placement x2 (2017, 2000), skin cancer, carcinoma, melanoma (removed 6 years ago), pre-diabetes, HTN, arthritis, kidney  stones, cataract implants, hx of adenomatous colonic polyps. Denies lung problem, seizures, stroke.   difficulty with push leaf blower, difficulty sleeping, any use of the R arm (ADLs, IADLs, social and community activities, pushing, pulling, stabilizing, lifting, carrying, yardwork, brushhogging, using tractor, etc).   Limitations  House hold activities;Lifting;Other (comment)    Diagnostic tests  Pre-Op MRI report 08/17/2019: "IMPRESSION:1. Supraspinatus tendinosis with full-thickness non-retracted tearof the anterior tendon fibers in the region of the critical zone.2. Subscapularis tendinosis with partial-thickness tearing.3. Intra-articular biceps tendinosis with partial tearing.4. Glenohumeral and acromioclavicular osteoarthritis."    Patient Stated Goals  get better    Currently in Pain?  Yes    Pain Score  1     Pain Location  Shoulder    Pain Orientation  Right    Pain Onset  1 to 4 weeks ago       TREATMENT:  Therapeutic exercise:to centralize symptoms and improve ROM, strength, muscular endurance, and activity tolerance required for successful completion of functional activities. - sidelying D2 and D1 R scapular AAROM to manually resisted motion x 20-40 total each direction.   Seated with R forearm fully supported by plinth:  - R wrist flexion and extension with 3# DB, 2x10 each - R forearm pronation/supination holding 1# hammer, 2x10 each way - R wrist radial deviation holding 1# hammer, 2x10 Pt asked to perform 2nd set with minimal cuing to ensure safety for HEP. Educated pt on importance of maintaining correct R forearm support and using appropriate load.  - Education on HEP including handout   Manual therapy: to reduce pain and tissue tension, improve range of motion, neuromodulation, in order to promote improved ability to complete functional activities. - supine PROM as tolerated R shoulder flexion, abduction, ER, R elbow x extension. X 20 each direction.  - gentle R  shoulder distraction to decrease tension.   HOME EXERCISE PROGRAM Access Code: [email or web address]  URL: [email or web address]  Date: 09/21/2019  Prepared by: Rosita Kea   Exercises Flexion-Extension Shoulder Pendulum with Table Support - 20 reps - 3x daily Horizontal Shoulder Pendulum with Table Support - 20 reps - 3x daily Circular Shoulder Pendulum with Table Support - 20 reps - 3x daily Seated Gripping Towel - 20 reps - 5 hold - 3x daily Standing Shoulder Shrug Circles AROM Backward - 20 reps - 2-3x daily Standing Shoulder Shrug Circles AROM Forward - 20 reps - 2-3x daily Seated Wrist Extension with Dumbbell - 2-3 sets - 10 reps - 2-3x weekly Seated Wrist Flexion with Dumbbell - 2-3 sets - 10 reps - 2-3x weekly Forearm Pronation and Supination with Hammer - 2-3 sets - 10 reps - 2-3x weekly Seated Wrist Radial Deviation with Dumbbell - 2-3 sets - 10 reps - 2-3x weekly    PT Education - 09/22/19 1956    Education Details  Exercise purpose/form. Self management techniques. HEP    Person(s) Educated  Patient    Methods  Explanation;Demonstration;Tactile cues;Verbal cues;Handout    Comprehension  Verbalized understanding;Returned demonstration;Verbal cues required;Tactile cues required;Need further instruction       PT Short Term Goals - 09/13/19 1700      PT SHORT TERM GOAL #1   Title  Be independent with initial home exercise program for self-management of symptoms.    Baseline  Initial HEP provided at IE (09/13/2019);    Time  2    Period  Weeks    Status  New    Target Date  09/27/19        PT Long Term Goals - 09/13/19 1701      PT LONG TERM GOAL #1   Title  Be independent with a long-term home exercise program for self-management of symptoms.    Baseline  Initial HEP provided at IE (09/13/2019);    Time  12    Period  Weeks    Status  New    Target Date  12/06/19      PT LONG TERM GOAL #2   Title  Demonstrate improved FOTO score to equal or greater than  65 to demonstrate improvement in overall condition and self-reported functional ability.    Baseline  FOTO = 43 (09/13/2019);    Time  12    Period  Weeks    Status  New    Target Date  12/06/19      PT LONG TERM GOAL #3   Title  Have R shoulder AROM equal or greater than L shoulder AROM with no compensations or increase in pain in all planes except intermittent end range discomfort to allow patient to complete valued activities with less difficulty.    Baseline  see objective exam, AROM deferred due to post-op precautions (09/13/2019);    Time  12    Period  Weeks    Status  New    Target Date  12/06/19      PT LONG TERM GOAL #4   Title  Improve R shoulder strength to 4+/5 for improved ability to allow patient to complete valued functional tasks such as reaching overhead and yardwork with less difficulty.    Baseline  testing deferred due to post-op precautions (09/13/2019);    Time  12    Period  Weeks    Status  New    Target Date  12/06/19      PT LONG TERM GOAL #5   Title  Complete community, work and/or recreational activities without limitation due to current condition.    Baseline  inability to lift or use R UE for activities such as reaching, lifting, carrying, stabilizing, pushing, pulling, grooming, ADLs, IADLs, yardwork, farmwork, community and social activity (09/13/2019);    Time  12    Period  Weeks    Status  New    Target Date  12/06/19            Plan - 09/22/19 2003    Clinical Impression Statement  Patient tolerated treatment well overall with mild discomfort with end range ROM and forearm exercises that resolved with rest. Patient very eager to do more, so educated him on how to complete forearm exercises safely. Patient able to replicate proper form in clinic and appeared to understand correct setup and performance. Patient able to tolerated increased PROM in flexion. Patient would benefit from continued management of limiting condition by skilled physical  therapist to address remaining impairments and functional limitations to work towards stated goals and  return to PLOF or maximal functional independence.    Personal Factors and Comorbidities  Age;Behavior Pattern;Comorbidity 3+;Education;Social Background;Time since onset of injury/illness/exacerbation;Past/Current Experience;Fitness    Comorbidities  hx of MI (2000) with stent placement x2 (2017, 2000), skin cancer, carcinoma, melanoma (removed 6 years ago), pre-diabetes, HTN, arthritis, kidney stones, cataract implants, hx of adenomatous colonic polyps. Denies lung problem, seizures, stroke    Examination-Activity Limitations  Bed Mobility;Bathing;Reach Overhead;Self Feeding;Carry;Dressing;Hygiene/Grooming;Sleep;Lift;Toileting    Examination-Participation Restrictions  Driving;Community Activity;Cleaning;Yard Work;Meal Prep;Other   inability to lift or use R UE for activities such as reaching, lifting, carrying, stabilizing, pushing, pulling, grooming, ADLs, IADLs, yardwork, farmwork, community and social activity   Stability/Clinical Decision Making  Stable/Uncomplicated    Rehab Potential  Good    PT Frequency  2x / week    PT Duration  12 weeks    PT Treatment/Interventions  ADLs/Self Care Home Management;Aquatic Therapy;Electrical Stimulation;Cryotherapy;Moist Heat;DME Instruction;Therapeutic activities;Therapeutic exercise;Balance training;Neuromuscular re-education;Patient/family education;Manual techniques;Compression bandaging;Scar mobilization;Dry needling;Passive range of motion;Joint Manipulations;Spinal Manipulations    PT Next Visit Plan  PROM, scapular strengthening, forearm and wrist strengthening    PT Home Exercise Plan  Medbridge Access Code: [email or web address]    Consulted and Agree with Plan of Care  Patient       Patient will benefit from skilled therapeutic intervention in order to improve the following deficits and impairments:  Decreased knowledge of use of DME, Decreased skin  integrity, Increased fascial restricitons, Pain, Increased muscle spasms, Decreased scar mobility, Decreased coordination, Decreased activity tolerance, Decreased endurance, Decreased range of motion, Decreased strength, Hypomobility, Impaired perceived functional ability, Impaired UE functional use, Impaired flexibility, Increased edema, Decreased safety awareness, Decreased knowledge of precautions  Visit Diagnosis: Chronic right shoulder pain  Stiffness of right shoulder, not elsewhere classified  Muscle weakness (generalized)     Problem List Patient Active Problem List   Diagnosis Date Noted  . Complete tear of right rotator cuff 09/07/2019  . Pain in right shoulder 07/27/2019  . Pre-diabetes 12/21/2015  . Abnormal stress test 12/20/2015  . Hypertension   . Coronary artery disease   . Hyperlipemia     Everlean Alstrom. Graylon Good, PT, DPT 09/22/19, 8:04 PM  [facility] [address] [place], [address] Phone: [phone]   Fax:  [phone]  Name: Kevin Stewart MRN: [id number] Date of Birth: 1940/12/10

## 2019-09-23 ENCOUNTER — Other Ambulatory Visit: Payer: Self-pay

## 2019-09-23 ENCOUNTER — Ambulatory Visit: Payer: Medicare Other | Admitting: Orthopaedic Surgery

## 2019-09-23 ENCOUNTER — Encounter: Payer: Self-pay | Admitting: Orthopaedic Surgery

## 2019-09-23 VITALS — Ht 71.0 in | Wt 165.0 lb

## 2019-09-23 DIAGNOSIS — M75121 Complete rotator cuff tear or rupture of right shoulder, not specified as traumatic: Secondary | ICD-10-CM

## 2019-09-23 NOTE — Progress Notes (Signed)
Office Visit Note   Patient: Kevin Stewart           Date of Birth: [birth year]           MRN: [id number] Visit Date: 09/23/2019              Requested by: [name], NP [address] [place],  [place] [address] PCP: [name], NP   Assessment & Plan: Visit Diagnoses:  1. Nontraumatic complete tear of right rotator cuff     Plan: 2 weeks status post right shoulder arthroscopic SCD, DCR and mini open rotator cuff tear repair and doing quite well.  No fever or chills.  Continues to wear the sling and is being followed in physical therapy with good progress.  We will continue with the sling and his home exercise program and I will check him back in a month at which point we will discontinue the sling.  Doing very well  Follow-Up Instructions: Return in about 1 month (around 10/24/2019).   Orders:  No orders of the defined types were placed in this encounter.  No orders of the defined types were placed in this encounter.     Procedures: No procedures performed   Clinical Data: No additional findings.   Subjective: Chief Complaint  Patient presents with  . Right Shoulder - Follow-up    Right shoulder arthroscopy DOS 09/02/2019  Patient presents today for follow up on his right shoulder. He had a right shoulder rotator cuff repair on 09/02/2019. He is now 3 weeks out from surgery. He is going to therapy twice weekly. He is wearing a sling and not taking anything for pain.   HPI  Review of Systems  Constitutional: Negative for fatigue.  HENT: Negative for ear pain.   Eyes: Negative for pain.  Respiratory: Negative for shortness of breath.   Cardiovascular: Negative for leg swelling.  Gastrointestinal: Negative for constipation and diarrhea.  Endocrine: Negative for cold intolerance and heat intolerance.  Genitourinary: Negative for difficulty urinating.  Musculoskeletal: Negative for joint swelling.  Skin: Negative for rash.  Allergic/Immunologic: Negative for food  allergies.  Neurological: Negative for weakness.  Hematological: Does not bruise/bleed easily.  Psychiatric/Behavioral: Negative for sleep disturbance.     Objective: Vital Signs: Ht 5\' 11"  (1.803 m)   Wt 165 lb (74.8 kg)   BMI 23.01 kg/m   Physical Exam  Ortho Exam right shoulder incisions healing without problem.  Good grip and good release.  Skin intact.  Neurologically intact  Specialty Comments:  No specialty comments available.  Imaging: No results found.   PMFS History: Patient Active Problem List   Diagnosis Date Noted  . Complete tear of right rotator cuff 09/07/2019  . Pain in right shoulder 07/27/2019  . Pre-diabetes 12/21/2015  . Abnormal stress test 12/20/2015  . Hypertension   . Coronary artery disease   . Hyperlipemia    Past Medical History:  Diagnosis Date  . Arthritis    "hands" (12/20/2015)  . Basal cell carcinoma, face   . Coronary artery disease    a. 2003 - light MI per patient; s/p Cypher DES to prox LAD in [facility].  Marland Kitchen Hx of adenomatous colonic polyps   . Hyperlipemia   . Hyperlipidemia   . Hypertension   . Kidney stones 1978   "passed them"  . Melanoma of right side of neck ([place])    removed posterior neck 12/312013  . Migraine    "stopped in 2000 when  I had my heart attack"  . Myocardial infarction ([place]) 2000  . Pre-diabetes    checks cbg q month  . Rotator cuff tear, right   . Wears dentures    upper-lower partial  . Wears glasses     Family History  Problem Relation Age of Onset  . Heart attack Mother 68  . Cancer - Other Father 27       pancreatic  . Heart attack Brother 64  . Cancer - Colon Brother 23  . Cancer - Other Sister 50    Past Surgical History:  Procedure Laterality Date  . CARDIAC CATHETERIZATION N/A 12/20/2015   Procedure: Left Heart Cath and Coronary Angiography;  Surgeon: Belva Crome, MD;  Location: [place] [facility];  Service: Cardiovascular;  Laterality: N/A;  . CARDIAC CATHETERIZATION N/A  12/20/2015   Procedure: Coronary Stent Intervention;  Surgeon: Belva Crome, MD;  Location: [place] [facility];  Service: Cardiovascular;  Laterality: N/A;  . CATARACT EXTRACTION W/ INTRAOCULAR LENS  IMPLANT, BILATERAL Bilateral 2011  . CIRCUMCISION  age 60  . COLONOSCOPY    . CORONARY ANGIOPLASTY WITH STENT PLACEMENT  2000   "1 stent"  . CORONARY ANGIOPLASTY WITH STENT PLACEMENT  12/20/2015   "1 stent"  . CYSTOSCOPY/URETEROSCOPY/HOLMIUM LASER/STENT PLACEMENT Right 08/03/2019   Procedure: CYSTOSCOPY/RETROGRADE/URETEROSCOPY//STENT PLACEMENT;  Surgeon: Ceasar Mons, MD;  Location: [facility];  Service: Urology;  Laterality: Right;  . HEMORRHOID SURGERY     "burned them off"  . MELANOMA EXCISION WITH SENTINEL LYMPH NODE BIOPSY  07/21/2012   Procedure: MELANOMA EXCISION WITH SENTINEL LYMPH NODE BIOPSY;  Surgeon: Rozetta Nunnery, MD;  Location: [place];  Service: General;  Laterality: N/A;  melanoma excision with sentinel node biopsy   posterior right neck  . TONSILLECTOMY  1961   Social History   Occupational History  . 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 Smoking status: Former Smoker    Packs/day: 2.00    Years: 10.00    Pack years: 20.00    Types: Cigarettes    Quit date: 07/10/1973    Years since quitting: 46.2  . Smokeless tobacco: Former Systems developer    Types: Chew  . Tobacco comment: "chewed a little; stopped in 06/1973"  Substance and Sexual Activity  . Alcohol use: Yes    Comment: 12/20/2015 'quit in ~ 2012-2013"  . Drug use: No  . Sexual activity: Never

## 2019-09-27 ENCOUNTER — Other Ambulatory Visit: Payer: Self-pay

## 2019-09-27 ENCOUNTER — Ambulatory Visit: Payer: Medicare Other | Admitting: Physical Therapy

## 2019-09-27 ENCOUNTER — Telehealth: Payer: Self-pay | Admitting: Orthopedic Surgery

## 2019-09-27 ENCOUNTER — Encounter: Payer: Self-pay | Admitting: Physical Therapy

## 2019-09-27 DIAGNOSIS — G8929 Other chronic pain: Secondary | ICD-10-CM | POA: Diagnosis not present

## 2019-09-27 DIAGNOSIS — M25611 Stiffness of right shoulder, not elsewhere classified: Secondary | ICD-10-CM | POA: Diagnosis not present

## 2019-09-27 DIAGNOSIS — M6281 Muscle weakness (generalized): Secondary | ICD-10-CM | POA: Diagnosis not present

## 2019-09-27 DIAGNOSIS — M25511 Pain in right shoulder: Secondary | ICD-10-CM | POA: Diagnosis not present

## 2019-09-27 NOTE — Telephone Encounter (Signed)
Mr. Fluckiger is s/p shoulder scope for rotator cuff tear.  Clarise Cruz is providing post operative physical therapy.  She would like to know a little more surgery information (regarding possible muscles that were cut during surgery) in order to plan patient's P.T. routine.  Please call her at [phone].

## 2019-09-27 NOTE — Telephone Encounter (Signed)
Please advise 

## 2019-09-27 NOTE — Therapy (Signed)
[facility] [address], [place], [address] Phone: [phone]   Fax:  [phone]  Physical Therapy Treatment  Patient Details  Name: Kevin Stewart MRN: [id number] Date of Birth: 12-22-1940 Referring Provider (PT): Garald Balding, MD   Encounter Date: 09/27/2019  PT End of Session - 09/27/19 1949    Visit Number  4    Number of Visits  24    Date for PT Re-Evaluation  12/06/19    Authorization Type  [employer] medicare reporting period from 09/13/2019    Authorization - Visit Number  4    Authorization - Number of Visits  10    PT Start Time  [id number]    PT Stop Time  1730    PT Time Calculation (min)  40 min    Activity Tolerance  Patient tolerated treatment well    Behavior During Therapy  [facility] for tasks assessed/performed       Past Medical History:  Diagnosis Date  . Arthritis    "hands" (12/20/2015)  . Basal cell carcinoma, face   . Coronary artery disease    a. 2003 - light MI per patient; s/p Cypher DES to prox LAD in [facility].  Marland Kitchen Hx of adenomatous colonic polyps   . Hyperlipemia   . Hyperlipidemia   . Hypertension   . Kidney stones 1978   "passed them"  . Melanoma of right side of neck ([place])    removed posterior neck 12/312013  . Migraine    "stopped in 2000 when I had my heart attack"  . Myocardial infarction ([place]) 2000  . Pre-diabetes    checks cbg q month  . Rotator cuff tear, right   . Wears dentures    upper-lower partial  . Wears glasses     Past Surgical History:  Procedure Laterality Date  . CARDIAC CATHETERIZATION N/A 12/20/2015   Procedure: Left Heart Cath and Coronary Angiography;  Surgeon: Belva Crome, MD;  Location: [facility];  Service: Cardiovascular;  Laterality: N/A;  . CARDIAC CATHETERIZATION N/A 12/20/2015   Procedure: Coronary Stent Intervention;  Surgeon: Belva Crome, MD;  Location: [facility];  Service: Cardiovascular;  Laterality: N/A;  .  CATARACT EXTRACTION W/ INTRAOCULAR LENS  IMPLANT, BILATERAL Bilateral 2011  . CIRCUMCISION  age 50  . COLONOSCOPY    . CORONARY ANGIOPLASTY WITH STENT PLACEMENT  2000   "1 stent"  . CORONARY ANGIOPLASTY WITH STENT PLACEMENT  12/20/2015   "1 stent"  . CYSTOSCOPY/URETEROSCOPY/HOLMIUM LASER/STENT PLACEMENT Right 08/03/2019   Procedure: CYSTOSCOPY/RETROGRADE/URETEROSCOPY//STENT PLACEMENT;  Surgeon: Ceasar Mons, MD;  Location: [facility];  Service: Urology;  Laterality: Right;  . HEMORRHOID SURGERY     "burned them off"  . MELANOMA EXCISION WITH SENTINEL LYMPH NODE BIOPSY  07/21/2012   Procedure: MELANOMA EXCISION WITH SENTINEL LYMPH NODE BIOPSY;  Surgeon: Rozetta Nunnery, MD;  Location: [place];  Service: General;  Laterality: N/A;  melanoma excision with sentinel node biopsy   posterior right neck  . TONSILLECTOMY  1961    There were no vitals filed for this visit.  Subjective Assessment - 09/27/19 1650    Subjective  Patient reports he is feeling well. Saw his surgeon who felt he was doing well per chart review. He is doing his HEP successfully with 3# DBs and his surgeon did not want him to try 4# at his follow up. He reports he was a  bit sore after last session and after HEP bouts but it resolved.    Pertinent History  Patient is a 79 y.o. male who presents to outpatient physical therapy with a referral for medical diagnosis chonic R shoulder pain, nontraumatic complete tear of R rotator cuff. This patient's chief complaints consist of R shoulder pain, stiffness, weakness, surgical precautions s/p R RTC repair 09/02/2019 leading to the following functional deficits: inability to lift or use R UE for activities such as reaching, lifting, carrying, stabilizing, pushing, pulling, grooming, ADLs, IADLs, yardwork, farmwork, community and social activity. Relevant past medical history and comorbidities include hx of MI (2000) with stent placement x2  (2017, 2000), skin cancer, carcinoma, melanoma (removed 6 years ago), pre-diabetes, HTN, arthritis, kidney stones, cataract implants, hx of adenomatous colonic polyps. Denies lung problem, seizures, stroke.   difficulty with push leaf blower, difficulty sleeping, any use of the R arm (ADLs, IADLs, social and community activities, pushing, pulling, stabilizing, lifting, carrying, yardwork, brushhogging, using tractor, etc).   Limitations  House hold activities;Lifting;Other (comment)    Diagnostic tests  Pre-Op MRI report 08/17/2019: "IMPRESSION:1. Supraspinatus tendinosis with full-thickness non-retracted tearof the anterior tendon fibers in the region of the critical zone.2. Subscapularis tendinosis with partial-thickness tearing.3. Intra-articular biceps tendinosis with partial tearing.4. Glenohumeral and acromioclavicular osteoarthritis."    Patient Stated Goals  get better    Currently in Pain?  No/denies    Pain Location  Shoulder    Pain Orientation  Right    Pain Onset  1 to 4 weeks ago          TREATMENT:  Therapeutic exercise:to centralize symptoms and improve ROM, strength, muscular endurance, and activity tolerance required for successful completion of functional activities. - sidelying D2 and D1 R scapular AAROM to manually resisted motion x 20-40 total each direction.   Seated with R forearm fully supported by plinth:  - R wrist flexion and extension with 3# DB, x10 each - R forearm pronation/supination holding 1# hammer, x10 each way - R wrist radial deviation holding 1# hammer, 2x10 - grip strengththening with soft putty in R hand. Sent home with patient.   Manual therapy:to reduce pain and tissue tension, improve range of motion, neuromodulation, in order to promote improved ability to complete functional activities. - supine PROM as tolerated R shoulder flexion, scaption, abduction, ER, IR, R elbow x extension and supination X 20 each direction.    - gentle R  shoulder distraction to decrease tension.  HOME EXERCISE PROGRAM Access Code: [email or web address]      URL: [email or web address]    Date: 09/21/2019  Prepared by: Rosita Kea   Exercises Flexion-Extension Shoulder Pendulum with Table Support - 20 reps - 3x daily Horizontal Shoulder Pendulum with Table Support - 20 reps - 3x daily Circular Shoulder Pendulum with Table Support - 20 reps - 3x daily Seated Gripping Towel - 20 reps - 5 hold - 3x daily Standing Shoulder Shrug Circles AROM Backward - 20 reps - 2-3x daily Standing Shoulder Shrug Circles AROM Forward - 20 reps - 2-3x daily Seated Wrist Extension with Dumbbell - 2-3 sets - 10 reps - 2-3x weekly Seated Wrist Flexion with Dumbbell - 2-3 sets - 10 reps - 2-3x weekly Forearm Pronation and Supination with Hammer - 2-3 sets - 10 reps - 2-3x weekly Seated Wrist Radial Deviation with Dumbbell - 2-3 sets - 10 reps - 2-3x weekly   PT Education - 09/27/19 1653    Education Details  Exercise purpose/form. Self management techniques.  Person(s) Educated  Patient    Methods  Explanation;Demonstration;Tactile cues;Verbal cues;Handout    Comprehension  Verbalized understanding;Returned demonstration;Verbal cues required;Tactile cues required;Need further instruction       PT Short Term Goals - 09/27/19 1951      PT SHORT TERM GOAL #1   Title  Be independent with initial home exercise program for self-management of symptoms.    Baseline  Initial HEP provided at IE (09/13/2019);    Time  2    Period  Weeks    Status  Achieved    Target Date  09/27/19        PT Long Term Goals - 09/13/19 1701      PT LONG TERM GOAL #1   Title  Be independent with a long-term home exercise program for self-management of symptoms.    Baseline  Initial HEP provided at IE (09/13/2019);    Time  12    Period  Weeks    Status  New    Target Date  12/06/19      PT LONG TERM GOAL #2   Title  Demonstrate improved FOTO score to equal or greater  than 65 to demonstrate improvement in overall condition and self-reported functional ability.    Baseline  FOTO = 43 (09/13/2019);    Time  12    Period  Weeks    Status  New    Target Date  12/06/19      PT LONG TERM GOAL #3   Title  Have R shoulder AROM equal or greater than L shoulder AROM with no compensations or increase in pain in all planes except intermittent end range discomfort to allow patient to complete valued activities with less difficulty.    Baseline  see objective exam, AROM deferred due to post-op precautions (09/13/2019);    Time  12    Period  Weeks    Status  New    Target Date  12/06/19      PT LONG TERM GOAL #4   Title  Improve R shoulder strength to 4+/5 for improved ability to allow patient to complete valued functional tasks such as reaching overhead and yardwork with less difficulty.    Baseline  testing deferred due to post-op precautions (09/13/2019);    Time  12    Period  Weeks    Status  New    Target Date  12/06/19      PT LONG TERM GOAL #5   Title  Complete community, work and/or recreational activities without limitation due to current condition.    Baseline  inability to lift or use R UE for activities such as reaching, lifting, carrying, stabilizing, pushing, pulling, grooming, ADLs, IADLs, yardwork, farmwork, community and social activity (09/13/2019);    Time  12    Period  Weeks    Status  New    Target Date  12/06/19            Plan - 09/27/19 1951    Clinical Impression Statement  Patient tolerated treatment well overall. Continues to have some end range pain but demonstrates gradually improving PROM each session. Continues to have difficulty with scapular motor control at first but it improves within session with cuing. Appears to be making appropriate progress at this point. Patient would benefit from continued management of limiting condition by skilled physical therapist to address remaining impairments and functional limitations to  work towards stated goals and return to PLOF or maximal functional independence.    Personal  Factors and Comorbidities  Age;Behavior Pattern;Comorbidity 3+;Education;Social Background;Time since onset of injury/illness/exacerbation;Past/Current Experience;Fitness    Comorbidities  hx of MI (2000) with stent placement x2 (2017, 2000), skin cancer, carcinoma, melanoma (removed 6 years ago), pre-diabetes, HTN, arthritis, kidney stones, cataract implants, hx of adenomatous colonic polyps. Denies lung problem, seizures, stroke    Examination-Activity Limitations  Bed Mobility;Bathing;Reach Overhead;Self Feeding;Carry;Dressing;Hygiene/Grooming;Sleep;Lift;Toileting    Examination-Participation Restrictions  Driving;Community Activity;Cleaning;Yard Work;Meal Prep;Other   inability to lift or use R UE for activities such as reaching, lifting, carrying, stabilizing, pushing, pulling, grooming, ADLs, IADLs, yardwork, farmwork, community and social activity   Stability/Clinical Decision Making  Stable/Uncomplicated    Rehab Potential  Good    PT Frequency  2x / week    PT Duration  12 weeks    PT Treatment/Interventions  ADLs/Self Care Home Management;Aquatic Therapy;Electrical Stimulation;Cryotherapy;Moist Heat;DME Instruction;Therapeutic activities;Therapeutic exercise;Balance training;Neuromuscular re-education;Patient/family education;Manual techniques;Compression bandaging;Scar mobilization;Dry needling;Passive range of motion;Joint Manipulations;Spinal Manipulations    PT Next Visit Plan  PROM, scapular strengthening, forearm and wrist strengthening    PT Home Exercise Plan  Medbridge Access Code: [email or web address]    Consulted and Agree with Plan of Care  Patient       Patient will benefit from skilled therapeutic intervention in order to improve the following deficits and impairments:  Decreased knowledge of use of DME, Decreased skin integrity, Increased fascial restricitons, Pain, Increased muscle spasms,  Decreased scar mobility, Decreased coordination, Decreased activity tolerance, Decreased endurance, Decreased range of motion, Decreased strength, Hypomobility, Impaired perceived functional ability, Impaired UE functional use, Impaired flexibility, Increased edema, Decreased safety awareness, Decreased knowledge of precautions  Visit Diagnosis: Chronic right shoulder pain  Stiffness of right shoulder, not elsewhere classified  Muscle weakness (generalized)     Problem List Patient Active Problem List   Diagnosis Date Noted  . Complete tear of right rotator cuff 09/07/2019  . Pain in right shoulder 07/27/2019  . Pre-diabetes 12/21/2015  . Abnormal stress test 12/20/2015  . Hypertension   . Coronary artery disease   . Hyperlipemia     Everlean Alstrom. Graylon Good, PT, DPT 09/27/19, 7:52 PM  [facility] [address] [place], [address] Phone: [phone]   Fax:  [phone]  Name: Kevin Stewart MRN: [id number] Date of Birth: 01-18-1941

## 2019-09-28 NOTE — Telephone Encounter (Signed)
Discussed with PT

## 2019-09-29 ENCOUNTER — Encounter: Payer: Self-pay | Admitting: Physical Therapy

## 2019-09-29 ENCOUNTER — Ambulatory Visit: Payer: Medicare Other | Admitting: Physical Therapy

## 2019-09-29 ENCOUNTER — Other Ambulatory Visit: Payer: Self-pay

## 2019-09-29 DIAGNOSIS — M25611 Stiffness of right shoulder, not elsewhere classified: Secondary | ICD-10-CM

## 2019-09-29 DIAGNOSIS — M6281 Muscle weakness (generalized): Secondary | ICD-10-CM

## 2019-09-29 DIAGNOSIS — G8929 Other chronic pain: Secondary | ICD-10-CM | POA: Diagnosis not present

## 2019-09-29 DIAGNOSIS — M25511 Pain in right shoulder: Secondary | ICD-10-CM | POA: Diagnosis not present

## 2019-09-29 NOTE — Therapy (Signed)
[facility] [address], [place], [address] Phone: [phone]   Fax:  [phone]  Physical Therapy Treatment  Patient Details  Name: Kevin Stewart MRN: [id number] Date of Birth: 06/01/1941 Referring Provider (PT): Garald Balding, MD   Encounter Date: 09/29/2019  PT End of Session - 09/29/19 1148    Visit Number  5    Number of Visits  24    Date for PT Re-Evaluation  12/06/19    Authorization Type  [employer] medicare reporting period from 09/13/2019    Authorization - Visit Number  5    Authorization - Number of Visits  10    PT Start Time  [id number]    PT Stop Time  1114    PT Time Calculation (min)  39 min    Activity Tolerance  Patient tolerated treatment well    Behavior During Therapy  [facility] for tasks assessed/performed       Past Medical History:  Diagnosis Date  . Arthritis    "hands" (12/20/2015)  . Basal cell carcinoma, face   . Coronary artery disease    a. 2003 - light MI per patient; s/p Cypher DES to prox LAD in [facility].  Marland Kitchen Hx of adenomatous colonic polyps   . Hyperlipemia   . Hyperlipidemia   . Hypertension   . Kidney stones 1978   "passed them"  . Melanoma of right side of neck ([place])    removed posterior neck 12/312013  . Migraine    "stopped in 2000 when I had my heart attack"  . Myocardial infarction ([place]) 2000  . Pre-diabetes    checks cbg q month  . Rotator cuff tear, right   . Wears dentures    upper-lower partial  . Wears glasses     Past Surgical History:  Procedure Laterality Date  . CARDIAC CATHETERIZATION N/A 12/20/2015   Procedure: Left Heart Cath and Coronary Angiography;  Surgeon: Belva Crome, MD;  Location: [facility];  Service: Cardiovascular;  Laterality: N/A;  . CARDIAC CATHETERIZATION N/A 12/20/2015   Procedure: Coronary Stent Intervention;  Surgeon: Belva Crome, MD;  Location: [facility];  Service: Cardiovascular;  Laterality: N/A;  .  CATARACT EXTRACTION W/ INTRAOCULAR LENS  IMPLANT, BILATERAL Bilateral 2011  . CIRCUMCISION  age 19  . COLONOSCOPY    . CORONARY ANGIOPLASTY WITH STENT PLACEMENT  2000   "1 stent"  . CORONARY ANGIOPLASTY WITH STENT PLACEMENT  12/20/2015   "1 stent"  . CYSTOSCOPY/URETEROSCOPY/HOLMIUM LASER/STENT PLACEMENT Right 08/03/2019   Procedure: CYSTOSCOPY/RETROGRADE/URETEROSCOPY//STENT PLACEMENT;  Surgeon: Ceasar Mons, MD;  Location: [facility];  Service: Urology;  Laterality: Right;  . HEMORRHOID SURGERY     "burned them off"  . MELANOMA EXCISION WITH SENTINEL LYMPH NODE BIOPSY  07/21/2012   Procedure: MELANOMA EXCISION WITH SENTINEL LYMPH NODE BIOPSY;  Surgeon: Rozetta Nunnery, MD;  Location: [facility];  Service: General;  Laterality: N/A;  melanoma excision with sentinel node biopsy   posterior right neck  . TONSILLECTOMY  1961    There were no vitals filed for this visit.  Subjective Assessment - 09/29/19 1034    Subjective  Patient reports he is feeling well today and was a bit sore following last treatment session but not pain.    Pertinent History  Patient is a 79 y.o. male who presents to outpatient physical therapy with a referral for medical diagnosis chonic R shoulder  pain, nontraumatic complete tear of R rotator cuff. This patient's chief complaints consist of R shoulder pain, stiffness, weakness, surgical precautions s/p R RTC repair 09/02/2019 leading to the following functional deficits: inability to lift or use R UE for activities such as reaching, lifting, carrying, stabilizing, pushing, pulling, grooming, ADLs, IADLs, yardwork, farmwork, community and social activity. Relevant past medical history and comorbidities include hx of MI (2000) with stent placement x2 (2017, 2000), skin cancer, carcinoma, melanoma (removed 6 years ago), pre-diabetes, HTN, arthritis, kidney stones, cataract implants, hx of adenomatous colonic polyps. Denies lung  problem, seizures, stroke.   difficulty with push leaf blower, difficulty sleeping, any use of the R arm (ADLs, IADLs, social and community activities, pushing, pulling, stabilizing, lifting, carrying, yardwork, brushhogging, using tractor, etc).   Limitations  House hold activities;Lifting;Other (comment)    Diagnostic tests  Pre-Op MRI report 08/17/2019: "IMPRESSION:1. Supraspinatus tendinosis with full-thickness non-retracted tearof the anterior tendon fibers in the region of the critical zone.2. Subscapularis tendinosis with partial-thickness tearing.3. Intra-articular biceps tendinosis with partial tearing.4. Glenohumeral and acromioclavicular osteoarthritis."    Patient Stated Goals  get better    Currently in Pain?  No/denies    Pain Onset  1 to 4 weeks ago       TREATMENT:  Therapeutic exercise:to centralize symptoms and improve ROM, strength, muscular endurance, and activity tolerance required for successful completion of functional activities. - AAROM pulley R UE in scaption x20 as tolerated - supine AAROM hands clasped R UE flexion x 20 as tolerated.  - sidelying D2 and D1 R scapular AAROM to AROM (x10 each) manually resisted motion 3x20 each direction with increasing speed.   Manual therapy:to reduce pain and tissue tension, improve range of motion, neuromodulation, in order to promote improved ability to complete functional activities. - supine PROM as tolerated R shoulder flexion, scaption, abduction, ER, IR, R elbow x extension and supination X 20 each direction.    - gentle R shoulder distraction and shaking to decrease tension.  HOME EXERCISE PROGRAM Access Code: [email or web address] URL: [email or web address] Date: 09/29/2019 Prepared by: Rosita Kea  Exercises Flexion-Extension Shoulder Pendulum with Table Support - 20 reps - 3x daily Horizontal Shoulder Pendulum with Table Support - 20 reps - 3x daily Circular Shoulder Pendulum with Table Support - 20  reps - 3x daily Seated Gripping Towel - 20 reps - 5 hold - 3x daily Standing Shoulder Shrug Circles AROM Backward - 20 reps - 2-3x daily Standing Shoulder Shrug Circles AROM Forward - 20 reps - 2-3x daily Seated Wrist Extension with Dumbbell - 2-3 sets - 10 reps - 2-3x weekly Seated Wrist Flexion with Dumbbell - 2-3 sets - 10 reps - 2-3x weekly Forearm Pronation and Supination with Hammer - 2-3 sets - 10 reps - 2-3x weekly Seated Wrist Radial Deviation with Dumbbell - 2-3 sets - 10 reps - 2-3x weekly Supine Shoulder Flexion AAROM with Hands Clasped - 20 reps - 1-2x daily    PT Education - 09/29/19 1148    Education Details  Exercise purpose/form. Self management techniques. HEP    Person(s) Educated  Patient    Methods  Explanation;Demonstration;Tactile cues;Verbal cues;Handout    Comprehension  Verbalized understanding;Returned demonstration;Verbal cues required;Tactile cues required;Need further instruction       PT Short Term Goals - 09/27/19 1951      PT SHORT TERM GOAL #1   Title  Be independent with initial home exercise program for self-management of symptoms.    Baseline  Initial HEP provided at IE (  09/13/2019);    Time  2    Period  Weeks    Status  Achieved    Target Date  09/27/19        PT Long Term Goals - 09/13/19 1701      PT LONG TERM GOAL #1   Title  Be independent with a long-term home exercise program for self-management of symptoms.    Baseline  Initial HEP provided at IE (09/13/2019);    Time  12    Period  Weeks    Status  New    Target Date  12/06/19      PT LONG TERM GOAL #2   Title  Demonstrate improved FOTO score to equal or greater than 65 to demonstrate improvement in overall condition and self-reported functional ability.    Baseline  FOTO = 43 (09/13/2019);    Time  12    Period  Weeks    Status  New    Target Date  12/06/19      PT LONG TERM GOAL #3   Title  Have R shoulder AROM equal or greater than L shoulder AROM with no  compensations or increase in pain in all planes except intermittent end range discomfort to allow patient to complete valued activities with less difficulty.    Baseline  see objective exam, AROM deferred due to post-op precautions (09/13/2019);    Time  12    Period  Weeks    Status  New    Target Date  12/06/19      PT LONG TERM GOAL #4   Title  Improve R shoulder strength to 4+/5 for improved ability to allow patient to complete valued functional tasks such as reaching overhead and yardwork with less difficulty.    Baseline  testing deferred due to post-op precautions (09/13/2019);    Time  12    Period  Weeks    Status  New    Target Date  12/06/19      PT LONG TERM GOAL #5   Title  Complete community, work and/or recreational activities without limitation due to current condition.    Baseline  inability to lift or use R UE for activities such as reaching, lifting, carrying, stabilizing, pushing, pulling, grooming, ADLs, IADLs, yardwork, farmwork, community and social activity (09/13/2019);    Time  12    Period  Weeks    Status  New    Target Date  12/06/19            Plan - 09/29/19 1209    Clinical Impression Statement  Patient tolerated treatment well with some soreness during activity that decreases with rest. Able to progress to some starting AAROM exercise and demonstrates improved motor control and ability for scapular exercises. Is still very limited due to recent RTC surgery and post op precautions. Patient would benefit from continued management of limiting condition by skilled physical therapist to address remaining impairments and functional limitations to work towards stated goals and return to PLOF or maximal functional independence.    Personal Factors and Comorbidities  Age;Behavior Pattern;Comorbidity 3+;Education;Social Background;Time since onset of injury/illness/exacerbation;Past/Current Experience;Fitness    Comorbidities  hx of MI (2000) with stent placement  x2 (2017, 2000), skin cancer, carcinoma, melanoma (removed 6 years ago), pre-diabetes, HTN, arthritis, kidney stones, cataract implants, hx of adenomatous colonic polyps. Denies lung problem, seizures, stroke    Examination-Activity Limitations  Bed Mobility;Bathing;Reach Overhead;Self Feeding;Carry;Dressing;Hygiene/Grooming;Sleep;Lift;Toileting    Examination-Participation Restrictions  Driving;Community Activity;Cleaning;Yard Work;Meal Prep;Other  inability to lift or use R UE for activities such as reaching, lifting, carrying, stabilizing, pushing, pulling, grooming, ADLs, IADLs, yardwork, farmwork, community and social activity   Stability/Clinical Decision Making  Stable/Uncomplicated    Rehab Potential  Good    PT Frequency  2x / week    PT Duration  12 weeks    PT Treatment/Interventions  ADLs/Self Care Home Management;Aquatic Therapy;Electrical Stimulation;Cryotherapy;Moist Heat;DME Instruction;Therapeutic activities;Therapeutic exercise;Balance training;Neuromuscular re-education;Patient/family education;Manual techniques;Compression bandaging;Scar mobilization;Dry needling;Passive range of motion;Joint Manipulations;Spinal Manipulations    PT Next Visit Plan  PROM, scapular strengthening, forearm and wrist strengthening    PT Home Exercise Plan  Medbridge Access Code: [email or web address]    Consulted and Agree with Plan of Care  Patient       Patient will benefit from skilled therapeutic intervention in order to improve the following deficits and impairments:  Decreased knowledge of use of DME, Decreased skin integrity, Increased fascial restricitons, Pain, Increased muscle spasms, Decreased scar mobility, Decreased coordination, Decreased activity tolerance, Decreased endurance, Decreased range of motion, Decreased strength, Hypomobility, Impaired perceived functional ability, Impaired UE functional use, Impaired flexibility, Increased edema, Decreased safety awareness, Decreased knowledge of  precautions  Visit Diagnosis: Chronic right shoulder pain  Stiffness of right shoulder, not elsewhere classified  Muscle weakness (generalized)     Problem List Patient Active Problem List   Diagnosis Date Noted  . Complete tear of right rotator cuff 09/07/2019  . Pain in right shoulder 07/27/2019  . Pre-diabetes 12/21/2015  . Abnormal stress test 12/20/2015  . Hypertension   . Coronary artery disease   . Hyperlipemia     Everlean Alstrom. Graylon Good, PT, DPT 09/29/19, 12:15 PM  [facility] [address] [place], [address] Phone: [phone]   Fax:  [phone]  Name: Kevin Stewart MRN: [id number] Date of Birth: October 26, 1940

## 2019-10-04 ENCOUNTER — Encounter: Payer: Self-pay | Admitting: Physical Therapy

## 2019-10-04 ENCOUNTER — Other Ambulatory Visit: Payer: Self-pay

## 2019-10-04 ENCOUNTER — Ambulatory Visit: Payer: Medicare Other | Admitting: Physical Therapy

## 2019-10-04 DIAGNOSIS — G8929 Other chronic pain: Secondary | ICD-10-CM | POA: Diagnosis not present

## 2019-10-04 DIAGNOSIS — M25611 Stiffness of right shoulder, not elsewhere classified: Secondary | ICD-10-CM | POA: Diagnosis not present

## 2019-10-04 DIAGNOSIS — M6281 Muscle weakness (generalized): Secondary | ICD-10-CM

## 2019-10-04 DIAGNOSIS — M25511 Pain in right shoulder: Secondary | ICD-10-CM | POA: Diagnosis not present

## 2019-10-04 NOTE — Therapy (Signed)
[facility] [address] [place], [address] Phone: [phone]   Fax:  [phone]  Physical Therapy Treatment  Patient Details  Name: Kevin Stewart MRN: [id number] Date of Birth: 10/28/1940 Referring Provider (PT): Garald Balding, MD   Encounter Date: 10/04/2019  PT End of Session - 10/04/19 1655    Visit Number  6    Number of Visits  24    Date for PT Re-Evaluation  12/06/19    Authorization Type  [employer] medicare reporting period from 09/13/2019    Authorization Time Period  N/A    Authorization - Visit Number  6    Authorization - Number of Visits  10    Progress Note Due on Visit  10    PT Start Time  [id number]    PT Stop Time  1730    PT Time Calculation (min)  40 min    Activity Tolerance  Patient tolerated treatment well    Behavior During Therapy  [facility] for tasks assessed/performed       Past Medical History:  Diagnosis Date  . Arthritis    "hands" (12/20/2015)  . Basal cell carcinoma, face   . Coronary artery disease    a. 2003 - light MI per patient; s/p Cypher DES to prox LAD in [facility].  Marland Kitchen Hx of adenomatous colonic polyps   . Hyperlipemia   . Hyperlipidemia   . Hypertension   . Kidney stones 1978   "passed them"  . Melanoma of right side of neck ([place])    removed posterior neck 12/312013  . Migraine    "stopped in 2000 when I had my heart attack"  . Myocardial infarction ([place]) 2000  . Pre-diabetes    checks cbg q month  . Rotator cuff tear, right   . Wears dentures    upper-lower partial  . Wears glasses     Past Surgical History:  Procedure Laterality Date  . CARDIAC CATHETERIZATION N/A 12/20/2015   Procedure: Left Heart Cath and Coronary Angiography;  Surgeon: Belva Crome, MD;  Location: [facility];  Service: Cardiovascular;  Laterality: N/A;  . CARDIAC CATHETERIZATION N/A 12/20/2015   Procedure: Coronary Stent Intervention;  Surgeon: Belva Crome, MD;   Location: [facility];  Service: Cardiovascular;  Laterality: N/A;  . CATARACT EXTRACTION W/ INTRAOCULAR LENS  IMPLANT, BILATERAL Bilateral 2011  . CIRCUMCISION  age 66  . COLONOSCOPY    . CORONARY ANGIOPLASTY WITH STENT PLACEMENT  2000   "1 stent"  . CORONARY ANGIOPLASTY WITH STENT PLACEMENT  12/20/2015   "1 stent"  . CYSTOSCOPY/URETEROSCOPY/HOLMIUM LASER/STENT PLACEMENT Right 08/03/2019   Procedure: CYSTOSCOPY/RETROGRADE/URETEROSCOPY//STENT PLACEMENT;  Surgeon: Ceasar Mons, MD;  Location: [facility];  Service: Urology;  Laterality: Right;  . HEMORRHOID SURGERY     "burned them off"  . MELANOMA EXCISION WITH SENTINEL LYMPH NODE BIOPSY  07/21/2012   Procedure: MELANOMA EXCISION WITH SENTINEL LYMPH NODE BIOPSY;  Surgeon: Rozetta Nunnery, MD;  Location: [facility];  Service: General;  Laterality: N/A;  melanoma excision with sentinel node biopsy   posterior right neck  . TONSILLECTOMY  1961    There were no vitals filed for this visit.  Subjective Assessment - 10/04/19 1651    Subjective  Patient reports he is feeling well today and things he is doing well. 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 States his pain is  1/10 upon arrival and doesn't get more than 3-4/10 when he pushes it a little. States he had no excessive soreness following last treatment session.    Pertinent History  Patient is a 79 y.o. male who presents to outpatient physical therapy with a referral for medical diagnosis chonic R shoulder pain, nontraumatic complete tear of R rotator cuff. This patient's chief complaints consist of R shoulder pain, stiffness, weakness, surgical precautions s/p R RTC repair 09/02/2019 leading to the following functional deficits: inability to lift or use R UE for activities such as reaching, lifting, carrying, stabilizing, pushing, pulling, grooming, ADLs, IADLs, yardwork, farmwork, community and social activity.  Relevant past medical history and comorbidities include hx of MI (2000) with stent placement x2 (2017, 2000), skin cancer, carcinoma, melanoma (removed 6 years ago), pre-diabetes, HTN, arthritis, kidney stones, cataract implants, hx of adenomatous colonic polyps. Denies lung problem, seizures, stroke.   difficulty with push leaf blower, difficulty sleeping, any use of the R arm (ADLs, IADLs, social and community activities, pushing, pulling, stabilizing, lifting, carrying, yardwork, brushhogging, using tractor, etc).   Limitations  House hold activities;Lifting;Other (comment)    Diagnostic tests  Pre-Op MRI report 08/17/2019: "IMPRESSION:1. Supraspinatus tendinosis with full-thickness non-retracted tearof the anterior tendon fibers in the region of the critical zone.2. Subscapularis tendinosis with partial-thickness tearing.3. Intra-articular biceps tendinosis with partial tearing.4. Glenohumeral and acromioclavicular osteoarthritis."    Patient Stated Goals  get better    Currently in Pain?  Yes    Pain Score  1     Pain Location  Shoulder    Pain Orientation  Right    Pain Descriptors / Indicators  Aching    Pain Onset  1 to 4 weeks ago       TREATMENT:  Therapeutic exercise:to centralize symptoms and improve ROM, strength, muscular endurance, and activity tolerance required for successful completion of functional activities. - AAROM pulley R UE in scaption x20 as tolerated - supine AAROM hands clasped R UE flexion x 3, switched to AAROM with PVC stick to complete x20 total as tolerated.  - standing submaximal R shoulder isometrics: with R shoulder in neutral, R elbow flexed to 90 degrees, and towel roll bolster under arm, x10 reps with 5 second hold each of the following directions of the shoulder: ER, IR, abduction, flexion, extension (bilateral). With cuing to learn technique and improve form.  - Education on HEP including handout   Manual therapy:to reduce pain and tissue tension,  improve range of motion, neuromodulation, in order to promote improved ability to complete functional activities. - supine PROM as tolerated R shoulder flexion,scaption,abduction, ER,IR,R elbow x extension and supinationX 20 each direction.  - gentle R shoulder distraction and shaking to decrease tension.  HOME EXERCISE PROGRAM Access Code: [email or web address] URL: [email or web address] Date: 10/04/2019 Prepared by: Rosita Kea  Exercises Standing Shoulder Shrug Circles AROM Backward - 2-3 x daily - 20 reps Standing Shoulder Shrug Circles AROM Forward - 2-3 x daily - 20 reps Seated Wrist Extension with Dumbbell - 2-3 x weekly - 2-3 sets - 10 reps Seated Wrist Flexion with Dumbbell - 2-3 x weekly - 2-3 sets - 10 reps Forearm Pronation and Supination with Hammer - 2-3 x weekly - 2-3 sets - 10 reps Seated Wrist Radial Deviation with Dumbbell - 2-3 x weekly - 2-3 sets - 10 reps Supine Shoulder Flexion Extension AAROM with Dowel - 1 x daily - 20 reps - 5 seconds hold Standing Isometric Shoulder External Rotation with Doorway and Towel Roll -  2 x daily - 1 sets - 10 reps - 5 seconds hold Standing Isometric Shoulder Internal Rotation with Towel Roll at Doorway - 2 x daily - 1 sets - 10 reps - 5 seconds hold Isometric Shoulder Flexion at Wall - 2 x daily - 1 sets - 10 reps - 5 seconds hold Isometric Shoulder Extension at Wall - 2 x daily - 1 sets - 10 reps - 5 seconds hold Isometric Shoulder Abduction at Wall - 2 x daily - 1 sets - 10 reps - 5 seconds hold   PT Education - 10/04/19 1655    Education Details  Exercise purpose/form. Self management techniques. HEP    Person(s) Educated  Patient    Methods  Explanation;Demonstration;Tactile cues;Verbal cues;Handout    Comprehension  Verbalized understanding;Returned demonstration;Verbal cues required;Tactile cues required       PT Short Term Goals - 09/27/19 1951      PT SHORT TERM GOAL #1   Title  Be independent with initial home  exercise program for self-management of symptoms.    Baseline  Initial HEP provided at IE (09/13/2019);    Time  2    Period  Weeks    Status  Achieved    Target Date  09/27/19        PT Long Term Goals - 09/13/19 1701      PT LONG TERM GOAL #1   Title  Be independent with a long-term home exercise program for self-management of symptoms.    Baseline  Initial HEP provided at IE (09/13/2019);    Time  12    Period  Weeks    Status  New    Target Date  12/06/19      PT LONG TERM GOAL #2   Title  Demonstrate improved FOTO score to equal or greater than 65 to demonstrate improvement in overall condition and self-reported functional ability.    Baseline  FOTO = 43 (09/13/2019);    Time  12    Period  Weeks    Status  New    Target Date  12/06/19      PT LONG TERM GOAL #3   Title  Have R shoulder AROM equal or greater than L shoulder AROM with no compensations or increase in pain in all planes except intermittent end range discomfort to allow patient to complete valued activities with less difficulty.    Baseline  see objective exam, AROM deferred due to post-op precautions (09/13/2019);    Time  12    Period  Weeks    Status  New    Target Date  12/06/19      PT LONG TERM GOAL #4   Title  Improve R shoulder strength to 4+/5 for improved ability to allow patient to complete valued functional tasks such as reaching overhead and yardwork with less difficulty.    Baseline  testing deferred due to post-op precautions (09/13/2019);    Time  12    Period  Weeks    Status  New    Target Date  12/06/19      PT LONG TERM GOAL #5   Title  Complete community, work and/or recreational activities without limitation due to current condition.    Baseline  inability to lift or use R UE for activities such as reaching, lifting, carrying, stabilizing, pushing, pulling, grooming, ADLs, IADLs, yardwork, farmwork, community and social activity (09/13/2019);    Time  12    Period  Weeks    Status  New     Target Date  12/06/19            Plan - 10/04/19 1747    Clinical Impression Statement  Patient tolerated treatment well overall with pain 3/10 or below during all exercises and manual therapy. Patient was able to progress toimi submaximal isometrics. He continues to be highly limited with use of R UE but is making appropriate progress at this point. HEP progressed as appropriate. Patient would benefit from continued management of limiting condition by skilled physical therapist to address remaining impairments and functional limitations to work towards stated goals and return to PLOF or maximal functional independence.    Personal Factors and Comorbidities  Age;Behavior Pattern;Comorbidity 3+;Education;Social Background;Time since onset of injury/illness/exacerbation;Past/Current Experience;Fitness    Comorbidities  hx of MI (2000) with stent placement x2 (2017, 2000), skin cancer, carcinoma, melanoma (removed 6 years ago), pre-diabetes, HTN, arthritis, kidney stones, cataract implants, hx of adenomatous colonic polyps. Denies lung problem, seizures, stroke    Examination-Activity Limitations  Bed Mobility;Bathing;Reach Overhead;Self Feeding;Carry;Dressing;Hygiene/Grooming;Sleep;Lift;Toileting    Examination-Participation Restrictions  Driving;Community Activity;Cleaning;Yard Work;Meal Prep;Other   inability to lift or use R UE for activities such as reaching, lifting, carrying, stabilizing, pushing, pulling, grooming, ADLs, IADLs, yardwork, farmwork, community and social activity   Stability/Clinical Decision Making  Stable/Uncomplicated    Rehab Potential  Good    PT Frequency  2x / week    PT Duration  12 weeks    PT Treatment/Interventions  ADLs/Self Care Home Management;Aquatic Therapy;Electrical Stimulation;Cryotherapy;Moist Heat;DME Instruction;Therapeutic activities;Therapeutic exercise;Balance training;Neuromuscular re-education;Patient/family education;Manual techniques;Compression  bandaging;Scar mobilization;Dry needling;Passive range of motion;Joint Manipulations;Spinal Manipulations    PT Next Visit Plan  PROM, scapular strengthening, forearm and wrist strengthening    PT Home Exercise Plan  Medbridge Access Code: [email or web address]    Consulted and Agree with Plan of Care  Patient       Patient will benefit from skilled therapeutic intervention in order to improve the following deficits and impairments:  Decreased knowledge of use of DME, Decreased skin integrity, Increased fascial restricitons, Pain, Increased muscle spasms, Decreased scar mobility, Decreased coordination, Decreased activity tolerance, Decreased endurance, Decreased range of motion, Decreased strength, Hypomobility, Impaired perceived functional ability, Impaired UE functional use, Impaired flexibility, Increased edema, Decreased safety awareness, Decreased knowledge of precautions  Visit Diagnosis: Chronic right shoulder pain  Stiffness of right shoulder, not elsewhere classified  Muscle weakness (generalized)     Problem List Patient Active Problem List   Diagnosis Date Noted  . Complete tear of right rotator cuff 09/07/2019  . Pain in right shoulder 07/27/2019  . Pre-diabetes 12/21/2015  . Abnormal stress test 12/20/2015  . Hypertension   . Coronary artery disease   . Hyperlipemia    Everlean Alstrom. Graylon Good, PT, DPT 10/04/19, 5:47 PM  [facility] [address] [place], [address] Phone: [phone]   Fax:  [phone]  Name: FRIEDRICH ALDRIDGE MRN: [id number] Date of Birth: 1940-08-31

## 2019-10-06 ENCOUNTER — Ambulatory Visit: Payer: Medicare Other | Admitting: Physical Therapy

## 2019-10-06 ENCOUNTER — Other Ambulatory Visit: Payer: Self-pay

## 2019-10-06 ENCOUNTER — Encounter: Payer: Self-pay | Admitting: Physical Therapy

## 2019-10-06 DIAGNOSIS — M6281 Muscle weakness (generalized): Secondary | ICD-10-CM

## 2019-10-06 DIAGNOSIS — M25611 Stiffness of right shoulder, not elsewhere classified: Secondary | ICD-10-CM

## 2019-10-06 DIAGNOSIS — G8929 Other chronic pain: Secondary | ICD-10-CM

## 2019-10-06 DIAGNOSIS — M25511 Pain in right shoulder: Secondary | ICD-10-CM | POA: Diagnosis not present

## 2019-10-06 NOTE — Therapy (Signed)
[facility] [address], [place], [address] Phone: [phone]   Fax:  [phone]  Physical Therapy Treatment  Patient Details  Name: Kevin Stewart MRN: [id number] Date of Birth: April 15, 1941 Referring Provider (PT): Garald Balding, MD   Encounter Date: 10/06/2019  PT End of Session - 10/06/19 1209    Visit Number  7    Number of Visits  24    Date for PT Re-Evaluation  12/06/19    Authorization Type  [employer] medicare reporting period from 09/13/2019    Authorization Time Period  N/A    Authorization - Visit Number  7    Authorization - Number of Visits  10    Progress Note Due on Visit  10    PT Start Time  0950    PT Stop Time  1030    PT Time Calculation (min)  40 min    Activity Tolerance  Patient tolerated treatment well    Behavior During Therapy  [facility] for tasks assessed/performed       Past Medical History:  Diagnosis Date  . Arthritis    "hands" (12/20/2015)  . Basal cell carcinoma, face   . Coronary artery disease    a. 2003 - light MI per patient; s/p Cypher DES to prox LAD in [facility].  Marland Kitchen Hx of adenomatous colonic polyps   . Hyperlipemia   . Hyperlipidemia   . Hypertension   . Kidney stones 1978   "passed them"  . Melanoma of right side of neck ([place])    removed posterior neck 12/312013  . Migraine    "stopped in 2000 when I had my heart attack"  . Myocardial infarction ([place]) 2000  . Pre-diabetes    checks cbg q month  . Rotator cuff tear, right   . Wears dentures    upper-lower partial  . Wears glasses     Past Surgical History:  Procedure Laterality Date  . CARDIAC CATHETERIZATION N/A 12/20/2015   Procedure: Left Heart Cath and Coronary Angiography;  Surgeon: Belva Crome, MD;  Location: [place] [facility];  Service: Cardiovascular;  Laterality: N/A;  . CARDIAC CATHETERIZATION N/A 12/20/2015   Procedure: Coronary Stent Intervention;  Surgeon: Belva Crome, MD;   Location: [place] [facility];  Service: Cardiovascular;  Laterality: N/A;  . CATARACT EXTRACTION W/ INTRAOCULAR LENS  IMPLANT, BILATERAL Bilateral 2011  . CIRCUMCISION  age 24  . COLONOSCOPY    . CORONARY ANGIOPLASTY WITH STENT PLACEMENT  2000   "1 stent"  . CORONARY ANGIOPLASTY WITH STENT PLACEMENT  12/20/2015   "1 stent"  . CYSTOSCOPY/URETEROSCOPY/HOLMIUM LASER/STENT PLACEMENT Right 08/03/2019   Procedure: CYSTOSCOPY/RETROGRADE/URETEROSCOPY//STENT PLACEMENT;  Surgeon: Ceasar Mons, MD;  Location: [facility];  Service: Urology;  Laterality: Right;  . HEMORRHOID SURGERY     "burned them off"  . MELANOMA EXCISION WITH SENTINEL LYMPH NODE BIOPSY  07/21/2012   Procedure: MELANOMA EXCISION WITH SENTINEL LYMPH NODE BIOPSY;  Surgeon: Rozetta Nunnery, MD;  Location: [facility];  Service: General;  Laterality: N/A;  melanoma excision with sentinel node biopsy   posterior right neck  . TONSILLECTOMY  1961    There were no vitals filed for this visit.  Subjective Assessment - 10/06/19 1208    Subjective  Patient reports he is feeling well today and has soreness not pain. Agrees it is 1/10 or less. Reports he gets some pain when doing the AAROM flexion  with the stick and has been doing his HEP. Has some questions about some of the isometric exercises.    Pertinent History  Patient is a 79 y.o. male who presents to outpatient physical therapy with a referral for medical diagnosis chonic R shoulder pain, nontraumatic complete tear of R rotator cuff. This patient's chief complaints consist of R shoulder pain, stiffness, weakness, surgical precautions s/p R RTC repair 09/02/2019 leading to the following functional deficits: inability to lift or use R UE for activities such as reaching, lifting, carrying, stabilizing, pushing, pulling, grooming, ADLs, IADLs, yardwork, farmwork, community and social activity. Relevant past medical history and comorbidities include hx  of MI (2000) with stent placement x2 (2017, 2000), skin cancer, carcinoma, melanoma (removed 6 years ago), pre-diabetes, HTN, arthritis, kidney stones, cataract implants, hx of adenomatous colonic polyps. Denies lung problem, seizures, stroke.   difficulty with push leaf blower, difficulty sleeping, any use of the R arm (ADLs, IADLs, social and community activities, pushing, pulling, stabilizing, lifting, carrying, yardwork, brushhogging, using tractor, etc).   Limitations  House hold activities;Lifting;Other (comment)    Diagnostic tests  Pre-Op MRI report 08/17/2019: "IMPRESSION:1. Supraspinatus tendinosis with full-thickness non-retracted tearof the anterior tendon fibers in the region of the critical zone.2. Subscapularis tendinosis with partial-thickness tearing.3. Intra-articular biceps tendinosis with partial tearing.4. Glenohumeral and acromioclavicular osteoarthritis."    Patient Stated Goals  get better    Currently in Pain?  Yes    Pain Score  1     Pain Location  Shoulder    Pain Orientation  Right    Pain Descriptors / Indicators  Sore    Pain Onset  1 to 4 weeks ago       TREATMENT:  Therapeutic exercise:to centralize symptoms and improve ROM, strength, muscular endurance, and activity tolerance required for successful completion of functional activities. - review of R shoulder submaximal isometric exercises including re-printing handout with handwritten notes to help pt distinguish between similar exercises. Patient demonstrates good form and technique when he remembers how to do exercises. Still needs cuing. - supine R shoulder stabilization. AAROM with help of PT to move R arm in and out of 90 degrees flexion. Then held there and did alphabet once (set 1), then 2x20 times each direction.  - Education on HEP including handout   Manual therapy:to reduce pain and tissue tension, improve range of motion, neuromodulation, in order to promote improved ability to complete  functional activities. - supine PROM as tolerated R shoulder scaption,abduction, ER,x10 each direction.  - gentle R shoulder distractionand shakingto decrease tension. - sidelying D2 and D1 flexion/extension R scapular manually resisted motion 3x20 each direction with increasing speed.   HOME EXERCISE PROGRAM Access Code: [email or web address] URL: [email or web address] Date: 10/04/2019 Prepared by: Rosita Kea  Exercises Standing Shoulder Shrug Circles AROM Backward - 2-3 x daily - 20 reps Standing Shoulder Shrug Circles AROM Forward - 2-3 x daily - 20 reps Seated Wrist Extension with Dumbbell - 2-3 x weekly - 2-3 sets - 10 reps Seated Wrist Flexion with Dumbbell - 2-3 x weekly - 2-3 sets - 10 reps Forearm Pronation and Supination with Hammer - 2-3 x weekly - 2-3 sets - 10 reps Seated Wrist Radial Deviation with Dumbbell - 2-3 x weekly - 2-3 sets - 10 reps Supine Shoulder Flexion Extension AAROM with Dowel - 1 x daily - 20 reps - 5 seconds hold Standing Isometric Shoulder External Rotation with Doorway and Towel Roll - 2 x daily - 1 sets - 10 reps -  5 seconds hold Standing Isometric Shoulder Internal Rotation with Towel Roll at Doorway - 2 x daily - 1 sets - 10 reps - 5 seconds hold Isometric Shoulder Flexion at Wall - 2 x daily - 1 sets - 10 reps - 5 seconds hold Isometric Shoulder Extension at Wall - 2 x daily - 1 sets - 10 reps - 5 seconds hold Isometric Shoulder Abduction at Wall - 2 x daily - 1 sets - 10 reps - 5 seconds hold    PT Education - 10/06/19 1209    Education Details  Exercise purpose/form. Self management techniques. HEP    Person(s) Educated  Patient    Methods  Explanation;Demonstration;Verbal cues;Handout;Tactile cues    Comprehension  Verbalized understanding;Returned demonstration;Verbal cues required;Tactile cues required       PT Short Term Goals - 09/27/19 1951      PT SHORT TERM GOAL #1   Title  Be independent with initial home exercise program  for self-management of symptoms.    Baseline  Initial HEP provided at IE (09/13/2019);    Time  2    Period  Weeks    Status  Achieved    Target Date  09/27/19        PT Long Term Goals - 09/13/19 1701      PT LONG TERM GOAL #1   Title  Be independent with a long-term home exercise program for self-management of symptoms.    Baseline  Initial HEP provided at IE (09/13/2019);    Time  12    Period  Weeks    Status  New    Target Date  12/06/19      PT LONG TERM GOAL #2   Title  Demonstrate improved FOTO score to equal or greater than 65 to demonstrate improvement in overall condition and self-reported functional ability.    Baseline  FOTO = 43 (09/13/2019);    Time  12    Period  Weeks    Status  New    Target Date  12/06/19      PT LONG TERM GOAL #3   Title  Have R shoulder AROM equal or greater than L shoulder AROM with no compensations or increase in pain in all planes except intermittent end range discomfort to allow patient to complete valued activities with less difficulty.    Baseline  see objective exam, AROM deferred due to post-op precautions (09/13/2019);    Time  12    Period  Weeks    Status  New    Target Date  12/06/19      PT LONG TERM GOAL #4   Title  Improve R shoulder strength to 4+/5 for improved ability to allow patient to complete valued functional tasks such as reaching overhead and yardwork with less difficulty.    Baseline  testing deferred due to post-op precautions (09/13/2019);    Time  12    Period  Weeks    Status  New    Target Date  12/06/19      PT LONG TERM GOAL #5   Title  Complete community, work and/or recreational activities without limitation due to current condition.    Baseline  inability to lift or use R UE for activities such as reaching, lifting, carrying, stabilizing, pushing, pulling, grooming, ADLs, IADLs, yardwork, farmwork, community and social activity (09/13/2019);    Time  12    Period  Weeks    Status  New    Target Date  12/06/19            Plan - 10/06/19 1216    Clinical Impression Statement  Patient tolerated treatment well overall. Reported decreased pain by end of session following scapular exercises. Appeared more confident in his ability to perform submaximal isometrics at home after HEP review. Discontinued AAROM flexion until next session due to pain with this at home and good motion currently. Patient continues to be limited and has not yet returned to PLOF. Patient would benefit from continued management of limiting condition by skilled physical therapist to address remaining impairments and functional limitations to work towards stated goals and return to PLOF or maximal functional independence.    Personal Factors and Comorbidities  Age;Behavior Pattern;Comorbidity 3+;Education;Social Background;Time since onset of injury/illness/exacerbation;Past/Current Experience;Fitness    Comorbidities  hx of MI (2000) with stent placement x2 (2017, 2000), skin cancer, carcinoma, melanoma (removed 6 years ago), pre-diabetes, HTN, arthritis, kidney stones, cataract implants, hx of adenomatous colonic polyps. Denies lung problem, seizures, stroke    Examination-Activity Limitations  Bed Mobility;Bathing;Reach Overhead;Self Feeding;Carry;Dressing;Hygiene/Grooming;Sleep;Lift;Toileting    Examination-Participation Restrictions  Driving;Community Activity;Cleaning;Yard Work;Meal Prep;Other   inability to lift or use R UE for activities such as reaching, lifting, carrying, stabilizing, pushing, pulling, grooming, ADLs, IADLs, yardwork, farmwork, community and social activity   Stability/Clinical Decision Making  Stable/Uncomplicated    Rehab Potential  Good    PT Frequency  2x / week    PT Duration  12 weeks    PT Treatment/Interventions  ADLs/Self Care Home Management;Aquatic Therapy;Electrical Stimulation;Cryotherapy;Moist Heat;DME Instruction;Therapeutic activities;Therapeutic exercise;Balance training;Neuromuscular  re-education;Patient/family education;Manual techniques;Compression bandaging;Scar mobilization;Dry needling;Passive range of motion;Joint Manipulations;Spinal Manipulations    PT Next Visit Plan  PROM, scapular strengthening, forearm and wrist strengthening    PT Home Exercise Plan  Medbridge Access Code: [email or web address]    Consulted and Agree with Plan of Care  Patient       Patient will benefit from skilled therapeutic intervention in order to improve the following deficits and impairments:  Decreased knowledge of use of DME, Decreased skin integrity, Increased fascial restricitons, Pain, Increased muscle spasms, Decreased scar mobility, Decreased coordination, Decreased activity tolerance, Decreased endurance, Decreased range of motion, Decreased strength, Hypomobility, Impaired perceived functional ability, Impaired UE functional use, Impaired flexibility, Increased edema, Decreased safety awareness, Decreased knowledge of precautions  Visit Diagnosis: Chronic right shoulder pain  Stiffness of right shoulder, not elsewhere classified  Muscle weakness (generalized)     Problem List Patient Active Problem List   Diagnosis Date Noted  . Complete tear of right rotator cuff 09/07/2019  . Pain in right shoulder 07/27/2019  . Pre-diabetes 12/21/2015  . Abnormal stress test 12/20/2015  . Hypertension   . Coronary artery disease   . Hyperlipemia     Everlean Alstrom. Graylon Good, PT, DPT 10/06/19, 12:16 PM  [facility] [address], [place], [address] Phone: [phone]   Fax:  [phone]  Name: SHRI ENFINGER MRN: [id number] Date of Birth: October 23, 1940

## 2019-10-11 ENCOUNTER — Ambulatory Visit: Payer: Medicare Other | Admitting: Physical Therapy

## 2019-10-11 ENCOUNTER — Other Ambulatory Visit: Payer: Self-pay

## 2019-10-11 DIAGNOSIS — M25511 Pain in right shoulder: Secondary | ICD-10-CM | POA: Diagnosis not present

## 2019-10-11 DIAGNOSIS — M6281 Muscle weakness (generalized): Secondary | ICD-10-CM | POA: Diagnosis not present

## 2019-10-11 DIAGNOSIS — N201 Calculus of ureter: Secondary | ICD-10-CM | POA: Diagnosis not present

## 2019-10-11 DIAGNOSIS — G8929 Other chronic pain: Secondary | ICD-10-CM

## 2019-10-11 DIAGNOSIS — N281 Cyst of kidney, acquired: Secondary | ICD-10-CM | POA: Diagnosis not present

## 2019-10-11 DIAGNOSIS — M25611 Stiffness of right shoulder, not elsewhere classified: Secondary | ICD-10-CM

## 2019-10-11 DIAGNOSIS — N132 Hydronephrosis with renal and ureteral calculous obstruction: Secondary | ICD-10-CM | POA: Diagnosis not present

## 2019-10-11 NOTE — Therapy (Signed)
[facility] [address], [place], [address] Phone: [phone]   Fax:  [phone]  Physical Therapy Treatment  Patient Details  Name: Kevin Stewart MRN: [id number] Date of Birth: 1941/03/16 Referring Provider (PT): Garald Balding, MD   Encounter Date: 10/11/2019  PT End of Session - 10/11/19 1814    Visit Number  8    Number of Visits  24    Date for PT Re-Evaluation  12/06/19    Authorization Type  [employer] medicare reporting period from 09/13/2019    Authorization Time Period  N/A    Authorization - Visit Number  8    Authorization - Number of Visits  10    Progress Note Due on Visit  10    PT Start Time  1735    PT Stop Time  1815    PT Time Calculation (min)  40 min    Activity Tolerance  Patient tolerated treatment well    Behavior During Therapy  [facility] for tasks assessed/performed       Past Medical History:  Diagnosis Date  . Arthritis    "hands" (12/20/2015)  . Basal cell carcinoma, face   . Coronary artery disease    a. 2003 - light MI per patient; s/p Cypher DES to prox LAD in [facility].  Marland Kitchen Hx of adenomatous colonic polyps   . Hyperlipemia   . Hyperlipidemia   . Hypertension   . Kidney stones 1978   "passed them"  . Melanoma of right side of neck ([place])    removed posterior neck 12/312013  . Migraine    "stopped in 2000 when I had my heart attack"  . Myocardial infarction ([place]) 2000  . Pre-diabetes    checks cbg q month  . Rotator cuff tear, right   . Wears dentures    upper-lower partial  . Wears glasses     Past Surgical History:  Procedure Laterality Date  . CARDIAC CATHETERIZATION N/A 12/20/2015   Procedure: Left Heart Cath and Coronary Angiography;  Surgeon: Belva Crome, MD;  Location: [place];  Service: Cardiovascular;  Laterality: N/A;  . CARDIAC CATHETERIZATION N/A 12/20/2015   Procedure: Coronary Stent Intervention;  Surgeon: Belva Crome, MD;   Location: [place] [facility];  Service: Cardiovascular;  Laterality: N/A;  . CATARACT EXTRACTION W/ INTRAOCULAR LENS  IMPLANT, BILATERAL Bilateral 2011  . CIRCUMCISION  age 80  . COLONOSCOPY    . CORONARY ANGIOPLASTY WITH STENT PLACEMENT  2000   "1 stent"  . CORONARY ANGIOPLASTY WITH STENT PLACEMENT  12/20/2015   "1 stent"  . CYSTOSCOPY/URETEROSCOPY/HOLMIUM LASER/STENT PLACEMENT Right 08/03/2019   Procedure: CYSTOSCOPY/RETROGRADE/URETEROSCOPY//STENT PLACEMENT;  Surgeon: Ceasar Mons, MD;  Location: [facility];  Service: Urology;  Laterality: Right;  . HEMORRHOID SURGERY     "burned them off"  . MELANOMA EXCISION WITH SENTINEL LYMPH NODE BIOPSY  07/21/2012   Procedure: MELANOMA EXCISION WITH SENTINEL LYMPH NODE BIOPSY;  Surgeon: Rozetta Nunnery, MD;  Location: [place];  Service: General;  Laterality: N/A;  melanoma excision with sentinel node biopsy   posterior right neck  . TONSILLECTOMY  1961    There were no vitals filed for this visit.  Subjective Assessment - 10/11/19 1739    Subjective  Patient reports no pain upon arrival. States his R shoulder is "really coming around" since last week. State he gets some pain if he rests his arm on  the arm rest while driving. Was a bit sore following last treatment session. Still gets mixed up between a couple of the exercises but is doing them the best he can.    Pertinent History  Patient is a 79 y.o. male who presents to outpatient physical therapy with a referral for medical diagnosis chonic R shoulder pain, nontraumatic complete tear of R rotator cuff. This patient's chief complaints consist of R shoulder pain, stiffness, weakness, surgical precautions s/p R RTC repair 09/02/2019 leading to the following functional deficits: inability to lift or use R UE for activities such as reaching, lifting, carrying, stabilizing, pushing, pulling, grooming, ADLs, IADLs, yardwork, farmwork, community and social  activity. Relevant past medical history and comorbidities include hx of MI (2000) with stent placement x2 (2017, 2000), skin cancer, carcinoma, melanoma (removed 6 years ago), pre-diabetes, HTN, arthritis, kidney stones, cataract implants, hx of adenomatous colonic polyps. Denies lung problem, seizures, stroke.   difficulty with push leaf blower, difficulty sleeping, any use of the R arm (ADLs, IADLs, social and community activities, pushing, pulling, stabilizing, lifting, carrying, yardwork, brushhogging, using tractor, etc).   Limitations  House hold activities;Lifting;Other (comment)    Diagnostic tests  Pre-Op MRI report 08/17/2019: "IMPRESSION:1. Supraspinatus tendinosis with full-thickness non-retracted tearof the anterior tendon fibers in the region of the critical zone.2. Subscapularis tendinosis with partial-thickness tearing.3. Intra-articular biceps tendinosis with partial tearing.4. Glenohumeral and acromioclavicular osteoarthritis."    Patient Stated Goals  get better    Currently in Pain?  No/denies    Pain Onset  1 to 4 weeks ago       TREATMENT:  Manual therapy:to reduce pain and tissue tension, improve range of motion, neuromodulation, in order to promote improved ability to complete functional activities. - supine PROM as tolerated R shoulder scaption (135),abduction, ER, x20 each direction.  - gentle R shoulder distractionand shakingto decrease tension. - sidelying D2 and D1 flexion/extension R scapularmanually resisted motion 3x20 each direction with increasing speed.  Therapeutic exercise:to centralize symptoms and improve ROM, strength, muscular endurance, and activity tolerance required for successful completion of functional activities. - standing R shoulder submaximal isometric IR, ER, flexion, extension, abduction, adduction x10 each with 5 second hold.Patient demonstrates good form and technique when he remembers how to do exercises. Still needs cuing but  improved from last session.   HOME EXERCISE PROGRAM Access Code: [email or web address] URL: [email or web address] Date: 10/04/2019 Prepared by: Rosita Kea  Exercises Standing Shoulder Shrug Circles AROM Backward - 2-3 x daily - 20 reps Standing Shoulder Shrug Circles AROM Forward - 2-3 x daily - 20 reps Seated Wrist Extension with Dumbbell - 2-3 x weekly - 2-3 sets - 10 reps Seated Wrist Flexion with Dumbbell - 2-3 x weekly - 2-3 sets - 10 reps Forearm Pronation and Supination with Hammer - 2-3 x weekly - 2-3 sets - 10 reps Seated Wrist Radial Deviation with Dumbbell - 2-3 x weekly - 2-3 sets - 10 reps Supine Shoulder Flexion Extension AAROM with Dowel - 1 x daily - 20 reps - 5 seconds hold Standing Isometric Shoulder External Rotation with Doorway and Towel Roll - 2 x daily - 1 sets - 10 reps - 5 seconds hold Standing Isometric Shoulder Internal Rotation with Towel Roll at Doorway - 2 x daily - 1 sets - 10 reps - 5 seconds hold Isometric Shoulder Flexion at Wall - 2 x daily - 1 sets - 10 reps - 5 seconds hold Isometric Shoulder Extension at Wall - 2 x daily - 1 sets -  10 reps - 5 seconds hold Isometric Shoulder Abduction at Wall - 2 x daily - 1 sets - 10 reps - 5 seconds hold   PT Education - 10/11/19 1813    Education Details  Exercise purpose/form. Self management techniques.    Person(s) Educated  Patient    Methods  Explanation;Demonstration;Tactile cues;Verbal cues    Comprehension  Verbalized understanding;Returned demonstration;Verbal cues required;Tactile cues required;Need further instruction       PT Short Term Goals - 09/27/19 1951      PT SHORT TERM GOAL #1   Title  Be independent with initial home exercise program for self-management of symptoms.    Baseline  Initial HEP provided at IE (09/13/2019);    Time  2    Period  Weeks    Status  Achieved    Target Date  09/27/19        PT Long Term Goals - 09/13/19 1701      PT LONG TERM GOAL #1   Title  Be  independent with a long-term home exercise program for self-management of symptoms.    Baseline  Initial HEP provided at IE (09/13/2019);    Time  12    Period  Weeks    Status  New    Target Date  12/06/19      PT LONG TERM GOAL #2   Title  Demonstrate improved FOTO score to equal or greater than 65 to demonstrate improvement in overall condition and self-reported functional ability.    Baseline  FOTO = 43 (09/13/2019);    Time  12    Period  Weeks    Status  New    Target Date  12/06/19      PT LONG TERM GOAL #3   Title  Have R shoulder AROM equal or greater than L shoulder AROM with no compensations or increase in pain in all planes except intermittent end range discomfort to allow patient to complete valued activities with less difficulty.    Baseline  see objective exam, AROM deferred due to post-op precautions (09/13/2019);    Time  12    Period  Weeks    Status  New    Target Date  12/06/19      PT LONG TERM GOAL #4   Title  Improve R shoulder strength to 4+/5 for improved ability to allow patient to complete valued functional tasks such as reaching overhead and yardwork with less difficulty.    Baseline  testing deferred due to post-op precautions (09/13/2019);    Time  12    Period  Weeks    Status  New    Target Date  12/06/19      PT LONG TERM GOAL #5   Title  Complete community, work and/or recreational activities without limitation due to current condition.    Baseline  inability to lift or use R UE for activities such as reaching, lifting, carrying, stabilizing, pushing, pulling, grooming, ADLs, IADLs, yardwork, farmwork, community and social activity (09/13/2019);    Time  12    Period  Weeks    Status  New    Target Date  12/06/19            Plan - 10/11/19 1813    Clinical Impression Statement  Patient tolerated treatment well overall and continues to make appropriate progress towards goals. He continues to have end range pain and struggles to coordinate  scapular movements. Patient would benefit from continued management of limiting condition by  skilled physical therapist to address remaining impairments and functional limitations to work towards stated goals and return to PLOF or maximal functional independence.    Personal Factors and Comorbidities  Age;Behavior Pattern;Comorbidity 3+;Education;Social Background;Time since onset of injury/illness/exacerbation;Past/Current Experience;Fitness    Comorbidities  hx of MI (2000) with stent placement x2 (2017, 2000), skin cancer, carcinoma, melanoma (removed 6 years ago), pre-diabetes, HTN, arthritis, kidney stones, cataract implants, hx of adenomatous colonic polyps. Denies lung problem, seizures, stroke    Examination-Activity Limitations  Bed Mobility;Bathing;Reach Overhead;Self Feeding;Carry;Dressing;Hygiene/Grooming;Sleep;Lift;Toileting    Examination-Participation Restrictions  Driving;Community Activity;Cleaning;Yard Work;Meal Prep;Other   inability to lift or use R UE for activities such as reaching, lifting, carrying, stabilizing, pushing, pulling, grooming, ADLs, IADLs, yardwork, farmwork, community and social activity   Stability/Clinical Decision Making  Stable/Uncomplicated    Rehab Potential  Good    PT Frequency  2x / week    PT Duration  12 weeks    PT Treatment/Interventions  ADLs/Self Care Home Management;Aquatic Therapy;Electrical Stimulation;Cryotherapy;Moist Heat;DME Instruction;Therapeutic activities;Therapeutic exercise;Balance training;Neuromuscular re-education;Patient/family education;Manual techniques;Compression bandaging;Scar mobilization;Dry needling;Passive range of motion;Joint Manipulations;Spinal Manipulations    PT Next Visit Plan  PROM, scapular strengthening, forearm and wrist strengthening    PT Home Exercise Plan  Medbridge Access Code: [email or web address]    Consulted and Agree with Plan of Care  Patient       Patient will benefit from skilled therapeutic intervention in  order to improve the following deficits and impairments:  Decreased knowledge of use of DME, Decreased skin integrity, Increased fascial restricitons, Pain, Increased muscle spasms, Decreased scar mobility, Decreased coordination, Decreased activity tolerance, Decreased endurance, Decreased range of motion, Decreased strength, Hypomobility, Impaired perceived functional ability, Impaired UE functional use, Impaired flexibility, Increased edema, Decreased safety awareness, Decreased knowledge of precautions  Visit Diagnosis: Chronic right shoulder pain  Stiffness of right shoulder, not elsewhere classified  Muscle weakness (generalized)     Problem List Patient Active Problem List   Diagnosis Date Noted  . Complete tear of right rotator cuff 09/07/2019  . Pain in right shoulder 07/27/2019  . Pre-diabetes 12/21/2015  . Abnormal stress test 12/20/2015  . Hypertension   . Coronary artery disease   . Hyperlipemia     Everlean Alstrom. Graylon Good, PT, DPT 10/11/19, 6:14 PM  [facility] [address], [place], [address] Phone: [phone]   Fax:  [phone]  Name: Kevin Stewart MRN: [id number] Date of Birth: [birth year]

## 2019-10-13 ENCOUNTER — Ambulatory Visit: Payer: Medicare Other | Admitting: Physical Therapy

## 2019-10-13 ENCOUNTER — Other Ambulatory Visit: Payer: Self-pay

## 2019-10-13 ENCOUNTER — Encounter: Payer: Self-pay | Admitting: Physical Therapy

## 2019-10-13 DIAGNOSIS — M25611 Stiffness of right shoulder, not elsewhere classified: Secondary | ICD-10-CM | POA: Diagnosis not present

## 2019-10-13 DIAGNOSIS — G8929 Other chronic pain: Secondary | ICD-10-CM

## 2019-10-13 DIAGNOSIS — M25511 Pain in right shoulder: Secondary | ICD-10-CM | POA: Diagnosis not present

## 2019-10-13 DIAGNOSIS — M6281 Muscle weakness (generalized): Secondary | ICD-10-CM

## 2019-10-13 NOTE — Therapy (Signed)
[facility] [address] [place], [address] Phone: [phone]   Fax:  [phone]  Physical Therapy Treatment  Patient Details  Name: Kevin Stewart MRN: [id number] Date of Birth: 1940/11/01 Referring Provider (PT): Garald Balding, MD   Encounter Date: 10/13/2019  PT End of Session - 10/13/19 1405    Visit Number  9    Number of Visits  24    Date for PT Re-Evaluation  12/06/19    Authorization Type  [employer] medicare reporting period from 09/13/2019    Authorization Time Period  N/A    Authorization - Visit Number  9    Authorization - Number of Visits  10    Progress Note Due on Visit  10    PT Start Time  [phone]    PT Stop Time  1028    PT Time Calculation (min)  41 min    Activity Tolerance  Patient tolerated treatment well    Behavior During Therapy  [facility] for tasks assessed/performed       Past Medical History:  Diagnosis Date  . Arthritis    "hands" (12/20/2015)  . Basal cell carcinoma, face   . Coronary artery disease    a. 2003 - light MI per patient; s/p Cypher DES to prox LAD in [facility].  Marland Kitchen Hx of adenomatous colonic polyps   . Hyperlipemia   . Hyperlipidemia   . Hypertension   . Kidney stones 1978   "passed them"  . Melanoma of right side of neck ([place])    removed posterior neck 12/312013  . Migraine    "stopped in 2000 when I had my heart attack"  . Myocardial infarction ([place]) 2000  . Pre-diabetes    checks cbg q month  . Rotator cuff tear, right   . Wears dentures    upper-lower partial  . Wears glasses     Past Surgical History:  Procedure Laterality Date  . CARDIAC CATHETERIZATION N/A 12/20/2015   Procedure: Left Heart Cath and Coronary Angiography;  Surgeon: Belva Crome, MD;  Location: [facility];  Service: Cardiovascular;  Laterality: N/A;  . CARDIAC CATHETERIZATION N/A 12/20/2015   Procedure: Coronary Stent Intervention;  Surgeon: Belva Crome, MD;   Location: [facility];  Service: Cardiovascular;  Laterality: N/A;  . CATARACT EXTRACTION W/ INTRAOCULAR LENS  IMPLANT, BILATERAL Bilateral 2011  . CIRCUMCISION  age 30  . COLONOSCOPY    . CORONARY ANGIOPLASTY WITH STENT PLACEMENT  2000   "1 stent"  . CORONARY ANGIOPLASTY WITH STENT PLACEMENT  12/20/2015   "1 stent"  . CYSTOSCOPY/URETEROSCOPY/HOLMIUM LASER/STENT PLACEMENT Right 08/03/2019   Procedure: CYSTOSCOPY/RETROGRADE/URETEROSCOPY//STENT PLACEMENT;  Surgeon: Ceasar Mons, MD;  Location: [facility];  Service: Urology;  Laterality: Right;  . HEMORRHOID SURGERY     "burned them off"  . MELANOMA EXCISION WITH SENTINEL LYMPH NODE BIOPSY  07/21/2012   Procedure: MELANOMA EXCISION WITH SENTINEL LYMPH NODE BIOPSY;  Surgeon: Rozetta Nunnery, MD;  Location: [facility];  Service: General;  Laterality: N/A;  melanoma excision with sentinel node biopsy   posterior right neck  . TONSILLECTOMY  1961    There were no vitals filed for this visit.   Subjective Assessment - 10/13/19 0951    Subjective  Patient reports he is feeling well today and was able to find a ball at home to use to push on the wall. Reports no pain upon  arrival. Was sore the next day when he woke up following last treatment session but it went away.    Pertinent History  Patient is a 78 y.o. male who presents to outpatient physical therapy with a referral for medical diagnosis chonic R shoulder pain, nontraumatic complete tear of R rotator cuff. This patient's chief complaints consist of R shoulder pain, stiffness, weakness, surgical precautions s/p R RTC repair 09/02/2019 leading to the following functional deficits: inability to lift or use R UE for activities such as reaching, lifting, carrying, stabilizing, pushing, pulling, grooming, ADLs, IADLs, yardwork, farmwork, community and social activity. Relevant past medical history and comorbidities include hx of MI (2000) with stent  placement x2 (2017, 2000), skin cancer, carcinoma, melanoma (removed 6 years ago), pre-diabetes, HTN, arthritis, kidney stones, cataract implants, hx of adenomatous colonic polyps. Denies lung problem, seizures, stroke.   difficulty with push leaf blower, difficulty sleeping, any use of the R arm (ADLs, IADLs, social and community activities, pushing, pulling, stabilizing, lifting, carrying, yardwork, brushhogging, using tractor, etc).   Limitations  House hold activities;Lifting;Other (comment)    Diagnostic tests  Pre-Op MRI report 08/17/2019: "IMPRESSION:1. Supraspinatus tendinosis with full-thickness non-retracted tearof the anterior tendon fibers in the region of the critical zone.2. Subscapularis tendinosis with partial-thickness tearing.3. Intra-articular biceps tendinosis with partial tearing.4. Glenohumeral and acromioclavicular osteoarthritis."    Patient Stated Goals  get better    Currently in Pain?  No/denies    Pain Onset  1 to 4 weeks ago       OJECTIVE R SHOULDER PROM - Flexion: 150 - Abduction: 90 - ER: 75  TREATMENT:  Manual therapy:to reduce pain and tissue tension, improve range of motion, neuromodulation, in order to promote improved ability to complete functional activities. - supine PROM as tolerated R shoulder flexion,abduction, ER, IR x20each direction.  - gentle R shoulder distractionand shakingto decrease tension.  Therapeutic exercise:to centralize symptoms and improve ROM, strength, muscular endurance, and activity tolerance required for successful completion of functional activities. -standing R shoulder submaximal isometric IR, ER, flexion, extension, abduction, adduction x10 each with 5 second hold.Patient demonstrates good form and technique when he remembers how to do exercises.  - standing scapular row with red theraband x 30 - wall slides into R shoulder flexion x10 - wall lean shoulder taps, x10 each side - supine R shoulder stabilization. CW  and CCW circles from 90 degrees flexion. 2x20 each direction.  - supine R  serratus punch 2x15 - supine R purturbation in 90 degrees flexion with protracted scapula, 3x30 seconds.  - Education on HEP including handout   HOME EXERCISE PROGRAM Access Code: [email or web address] URL: [email or web address] Date: 10/13/2019 Prepared by: Rosita Kea  Exercises Standing Shoulder Shrug Circles AROM Backward - 2-3 x daily - 20 reps Standing Shoulder Shrug Circles AROM Forward - 2-3 x daily - 20 reps Supine Shoulder Flexion Extension AAROM with Dowel - 1 x daily - 20 reps - 5 seconds hold Standing Isometric Shoulder External Rotation with Doorway and Towel Roll - 2 x daily - 1 sets - 10 reps - 5 seconds hold Standing Isometric Shoulder Internal Rotation with Towel Roll at Doorway - 2 x daily - 1 sets - 10 reps - 5 seconds hold Isometric Shoulder Flexion at Wall - 2 x daily - 1 sets - 10 reps - 5 seconds hold Isometric Shoulder Extension at Wall - 2 x daily - 1 sets - 10 reps - 5 seconds hold Isometric Shoulder Abduction at Wall - 2 x daily -  1 sets - 10 reps - 5 seconds hold Row with band/cable - 1-2 x daily - 30 reps Supine Shoulder Circles - 1-2 x daily - 2-3 sets - 20 reps Supine Single Arm Shoulder Protraction - 1-2 x daily - 2 sets - 15 reps   PT Education - 10/13/19 1404    Education Details  Exercise purpose/form. Self management techniques. HEP    Person(s) Educated  Patient    Methods  Explanation;Demonstration;Verbal cues;Tactile cues;Handout    Comprehension  Verbalized understanding;Returned demonstration;Verbal cues required;Tactile cues required;Need further instruction       PT Short Term Goals - 09/27/19 1951      PT SHORT TERM GOAL #1   Title  Be independent with initial home exercise program for self-management of symptoms.    Baseline  Initial HEP provided at IE (09/13/2019);    Time  2    Period  Weeks    Status  Achieved    Target Date  09/27/19        PT  Long Term Goals - 09/13/19 1701      PT LONG TERM GOAL #1   Title  Be independent with a long-term home exercise program for self-management of symptoms.    Baseline  Initial HEP provided at IE (09/13/2019);    Time  12    Period  Weeks    Status  New    Target Date  12/06/19      PT LONG TERM GOAL #2   Title  Demonstrate improved FOTO score to equal or greater than 65 to demonstrate improvement in overall condition and self-reported functional ability.    Baseline  FOTO = 43 (09/13/2019);    Time  12    Period  Weeks    Status  New    Target Date  12/06/19      PT LONG TERM GOAL #3   Title  Have R shoulder AROM equal or greater than L shoulder AROM with no compensations or increase in pain in all planes except intermittent end range discomfort to allow patient to complete valued activities with less difficulty.    Baseline  see objective exam, AROM deferred due to post-op precautions (09/13/2019);    Time  12    Period  Weeks    Status  New    Target Date  12/06/19      PT LONG TERM GOAL #4   Title  Improve R shoulder strength to 4+/5 for improved ability to allow patient to complete valued functional tasks such as reaching overhead and yardwork with less difficulty.    Baseline  testing deferred due to post-op precautions (09/13/2019);    Time  12    Period  Weeks    Status  New    Target Date  12/06/19      PT LONG TERM GOAL #5   Title  Complete community, work and/or recreational activities without limitation due to current condition.    Baseline  inability to lift or use R UE for activities such as reaching, lifting, carrying, stabilizing, pushing, pulling, grooming, ADLs, IADLs, yardwork, farmwork, community and social activity (09/13/2019);    Time  12    Period  Weeks    Status  New    Target Date  12/06/19            Plan - 10/13/19 1409    Clinical Impression Statement  Patient continues to make appropriate progress towards goals. Progressed gentle weight  bearing, scapular,  and stability exercises. Patient is limited in R shoulder strength, coordination, ROM, etc and has not yet returned to PLOF. Mild discomfort with most exercises. Patient would benefit from continued management of limiting condition by skilled physical therapist to address remaining impairments and functional limitations to work towards stated goals and return to PLOF or maximal functional independence.    Personal Factors and Comorbidities  Age;Behavior Pattern;Comorbidity 3+;Education;Social Background;Time since onset of injury/illness/exacerbation;Past/Current Experience;Fitness    Comorbidities  hx of MI (2000) with stent placement x2 (2017, 2000), skin cancer, carcinoma, melanoma (removed 6 years ago), pre-diabetes, HTN, arthritis, kidney stones, cataract implants, hx of adenomatous colonic polyps. Denies lung problem, seizures, stroke    Examination-Activity Limitations  Bed Mobility;Bathing;Reach Overhead;Self Feeding;Carry;Dressing;Hygiene/Grooming;Sleep;Lift;Toileting    Examination-Participation Restrictions  Driving;Community Activity;Cleaning;Yard Work;Meal Prep;Other   inability to lift or use R UE for activities such as reaching, lifting, carrying, stabilizing, pushing, pulling, grooming, ADLs, IADLs, yardwork, farmwork, community and social activity   Stability/Clinical Decision Making  Stable/Uncomplicated    Rehab Potential  Good    PT Frequency  2x / week    PT Duration  12 weeks    PT Treatment/Interventions  ADLs/Self Care Home Management;Aquatic Therapy;Electrical Stimulation;Cryotherapy;Moist Heat;DME Instruction;Therapeutic activities;Therapeutic exercise;Balance training;Neuromuscular re-education;Patient/family education;Manual techniques;Compression bandaging;Scar mobilization;Dry needling;Passive range of motion;Joint Manipulations;Spinal Manipulations    PT Next Visit Plan  Exercises for ROM, submaximal RTC strengthening, stability, scapular strengthening     PT Home Exercise Plan  Medbridge Access Code: [email or web address]    Consulted and Agree with Plan of Care  Patient       Patient will benefit from skilled therapeutic intervention in order to improve the following deficits and impairments:  Decreased knowledge of use of DME, Decreased skin integrity, Increased fascial restricitons, Pain, Increased muscle spasms, Decreased scar mobility, Decreased coordination, Decreased activity tolerance, Decreased endurance, Decreased range of motion, Decreased strength, Hypomobility, Impaired perceived functional ability, Impaired UE functional use, Impaired flexibility, Increased edema, Decreased safety awareness, Decreased knowledge of precautions  Visit Diagnosis: Chronic right shoulder pain  Stiffness of right shoulder, not elsewhere classified  Muscle weakness (generalized)     Problem List Patient Active Problem List   Diagnosis Date Noted  . Complete tear of right rotator cuff 09/07/2019  . Pain in right shoulder 07/27/2019  . Pre-diabetes 12/21/2015  . Abnormal stress test 12/20/2015  . Hypertension   . Coronary artery disease   . Hyperlipemia     Everlean Alstrom. Graylon Good, PT, DPT 10/13/19, 2:11 PM  [facility] [address] [place], [address] Phone: [phone]   Fax:  [phone]  Name: BROCH PELCHER MRN: [id number] Date of Birth: 03-30-1941

## 2019-10-18 ENCOUNTER — Encounter: Payer: Self-pay | Admitting: Physical Therapy

## 2019-10-18 ENCOUNTER — Ambulatory Visit: Payer: Medicare Other | Admitting: Physical Therapy

## 2019-10-18 ENCOUNTER — Other Ambulatory Visit: Payer: Self-pay

## 2019-10-18 DIAGNOSIS — M6281 Muscle weakness (generalized): Secondary | ICD-10-CM

## 2019-10-18 DIAGNOSIS — M25611 Stiffness of right shoulder, not elsewhere classified: Secondary | ICD-10-CM

## 2019-10-18 DIAGNOSIS — G8929 Other chronic pain: Secondary | ICD-10-CM

## 2019-10-18 DIAGNOSIS — M25511 Pain in right shoulder: Secondary | ICD-10-CM | POA: Diagnosis not present

## 2019-10-18 NOTE — Therapy (Signed)
[facility] [address] [place], [address] Phone: [phone]   Fax:  [phone]  Physical Therapy Treatment / Progress Note Reporting Period: 09/13/2019 - 10/18/2019  Patient Details  Name: Kevin Stewart MRN: [id number] Date of Birth: 1941-06-05 Referring Provider (PT): Garald Balding, MD   Encounter Date: 10/18/2019  PT End of Session - 10/18/19 1519    Visit Number  10    Number of Visits  24    Date for PT Re-Evaluation  12/06/19    Authorization Type  [employer] medicare reporting period from 09/13/2019    Authorization Time Period  N/A    Authorization - Visit Number  10    Authorization - Number of Visits  10    Progress Note Due on Visit  20    PT Start Time  1515    PT Stop Time  1558    PT Time Calculation (min)  43 min    Activity Tolerance  Patient tolerated treatment well    Behavior During Therapy  [facility] for tasks assessed/performed       Past Medical History:  Diagnosis Date  . Arthritis    "hands" (12/20/2015)  . Basal cell carcinoma, face   . Coronary artery disease    a. 2003 - light MI per patient; s/p Cypher DES to prox LAD in [facility].  Marland Kitchen Hx of adenomatous colonic polyps   . Hyperlipemia   . Hyperlipidemia   . Hypertension   . Kidney stones 1978   "passed them"  . Melanoma of right side of neck ([place])    removed posterior neck [id number]/[id number]  . Migraine    "stopped in 2000 when I had my heart attack"  . Myocardial infarction ([place]) 2000  . Pre-diabetes    checks cbg q month  . Rotator cuff tear, right   . Wears dentures    upper-lower partial  . Wears glasses     Past Surgical History:  Procedure Laterality Date  . CARDIAC CATHETERIZATION N/A 12/20/2015   Procedure: Left Heart Cath and Coronary Angiography;  Surgeon: Belva Crome, MD;  Location: [place] [facility];  Service: Cardiovascular;  Laterality: N/A;  . CARDIAC CATHETERIZATION N/A 12/20/2015   Procedure:  Coronary Stent Intervention;  Surgeon: Belva Crome, MD;  Location: [place] [facility];  Service: Cardiovascular;  Laterality: N/A;  . CATARACT EXTRACTION W/ INTRAOCULAR LENS  IMPLANT, BILATERAL Bilateral 2011  . CIRCUMCISION  age 76  . COLONOSCOPY    . CORONARY ANGIOPLASTY WITH STENT PLACEMENT  2000   "1 stent"  . CORONARY ANGIOPLASTY WITH STENT PLACEMENT  12/20/2015   "1 stent"  . CYSTOSCOPY/URETEROSCOPY/HOLMIUM LASER/STENT PLACEMENT Right 08/03/2019   Procedure: CYSTOSCOPY/RETROGRADE/URETEROSCOPY//STENT PLACEMENT;  Surgeon: Ceasar Mons, MD;  Location: [facility];  Service: Urology;  Laterality: Right;  . HEMORRHOID SURGERY     "burned them off"  . MELANOMA EXCISION WITH SENTINEL LYMPH NODE BIOPSY  07/21/2012   Procedure: MELANOMA EXCISION WITH SENTINEL LYMPH NODE BIOPSY;  Surgeon: Rozetta Nunnery, MD;  Location: [facility];  Service: General;  Laterality: N/A;  melanoma excision with sentinel node biopsy   posterior right neck  . TONSILLECTOMY  1961    There were no vitals filed for this visit.  Subjective Assessment - 10/18/19 1522    Subjective  Patient states he is feeling well upon arrival. States he has a bit of pain in the morning that feels  better once he gets moving. He also reports a bit of soreness following last treatment session. Has been doing HEP. Notes no pain upon arrival.    Pertinent History  Patient is a 79 y.o. male who presents to outpatient physical therapy with a referral for medical diagnosis chonic R shoulder pain, nontraumatic complete tear of R rotator cuff. This patient's chief complaints consist of R shoulder pain, stiffness, weakness, surgical precautions s/p R RTC repair 09/02/2019 leading to the following functional deficits: inability to lift or use R UE for activities such as reaching, lifting, carrying, stabilizing, pushing, pulling, grooming, ADLs, IADLs, yardwork, farmwork, community and social activity.  Relevant past medical history and comorbidities include hx of MI (2000) with stent placement x2 (2017, 2000), skin cancer, carcinoma, melanoma (removed 6 years ago), pre-diabetes, HTN, arthritis, kidney stones, cataract implants, hx of adenomatous colonic polyps. Denies lung problem, seizures, stroke.   difficulty with push leaf blower, difficulty sleeping, any use of the R arm (ADLs, IADLs, social and community activities, pushing, pulling, stabilizing, lifting, carrying, yardwork, brushhogging, using tractor, etc).   Limitations  House hold activities;Lifting;Other (comment)    Diagnostic tests  Pre-Op MRI report 08/17/2019: "IMPRESSION:1. Supraspinatus tendinosis with full-thickness non-retracted tearof the anterior tendon fibers in the region of the critical zone.2. Subscapularis tendinosis with partial-thickness tearing.3. Intra-articular biceps tendinosis with partial tearing.4. Glenohumeral and acromioclavicular osteoarthritis."    Patient Stated Goals  get better    Pain Onset  1 to 4 weeks ago    Effect of Pain on Daily Activities  Functional deficits: difficulty with push leaf blower, difficulty sleeping, any use of the R arm (ADLs, IADLs, social and community activities, pushing, pulling, stabilizing, lifting, carrying, etc).         OBJECTIVE  FOTO = 54  OBSERVATION/INSPECTION  Posture: forward head, rounded shoulders, slumped in sitting.   Tremor: R UE with fine motor tasks and at end range shoulder motion   Patient wearing R sling without adductor wedge upon arrival.    Bed mobility: Supine <> sit mod I with increased time to protect R arm  Transfers: sit <> stand I   Gait: grossly WFL for household and short community ambulation. More detailed gait analysis deferred to later date as needed.   PERIPHERAL JOINT MOTION (in degrees)  Active Range of Motion (AROM) *Indicates pain 09/13/19 10/18/19 Date  Joint/Motion R/L R/L R/L  Shoulder     Flexion /140 90*/ /  Extension  /72 48*/ /  Abduction /140 50*/ /  External rotation /35 55*/ /  Internal rotation /T6 L5*/ /  Comments: L AROM at neutral position. AROM at R shoulder deferred due to post-op precautions. B elbow and wrist AROM WFL. Slight lack of elbow extension B (10/18/2019);   Passive Range of Motion (PROM) *Indicates pain 09/13/19 10/18/19 Date  Joint/Motion R/L R/L R/L  Shoulder     Flexion 110*/150 140*/ /  Extension / / /  Abduction 83*/175 110*/ /  External rotation 50/85 60*/ /  Internal rotation Tissue approx/85 80*/ /  Comments: R PROM, L AROM at neutral position (09/13/2019). 45 degrees scaption for ER/IR on 10/18/19.   MUSCLE PERFORMANCE (MMT):  *Indicates pain 09/13/2019 10/18/19 Date  Joint/Motion R/L R/L R/L  Shoulder     Flexion /5 2*/ /  Extension / 2*/ /  Abduction /5 2*/ /  External rotation /4 2*/ /  Internal rotation /4+ 2*/ /  Elbow     Flexion /5 3/ /  Extension /5 4/ /  Hand     Grip WFL R >L WFL /  Comments: R shoulder and elbow MMT deferred due to post-op precautions (09/13/2019);  ACCESSORY MOTION:   R GH joint mildly hypomobile in posterior and inferior directions.   PALPATION:  Mildly TTP over R shoulder at incision sites.   EDUCATION/COGNITION: Patient is alert and oriented X 4.  Objective measurements completed on examination: See above findings.     TREATMENT:  10 min to complete FOTO (unbilled)  Manual therapy:to reduce pain and tissue tension, improve range of motion, neuromodulation, in order to promote improved ability to complete functional activities. - supine PROM as tolerated R shoulder flexion,abduction, ER, IR x20each direction. - gentle R shoulder distractionand shakingto decrease tension.  Therapeutic exercise:to centralize symptoms and improve ROM, strength, muscular endurance, and activity tolerance required for successful completion of functional activities. - supine AAROM flexion with stick, 5 second hold,  x20 - measurements to assess progress (See above) - Education on diagnosis, prognosis, POC, anatomy and physiology of current condition.  - Education on HEP including returning to supine AAROM flexion with stick  HOME EXERCISE PROGRAM Access Code: [email or web address] URL: [email or web address] Date: 10/13/2019 Prepared by: Rosita Kea  Exercises Standing Shoulder Shrug Circles AROM Backward - 2-3 x daily - 20 reps Standing Shoulder Shrug Circles AROM Forward - 2-3 x daily - 20 reps Supine Shoulder Flexion Extension AAROM with [place] - 1 x daily - 20 reps - 5 seconds hold Standing Isometric Shoulder External Rotation with Doorway and Towel Roll - 2 x daily - 1 sets - 10 reps - 5 seconds hold Standing Isometric Shoulder Internal Rotation with Towel Roll at Doorway - 2 x daily - 1 sets - 10 reps - 5 seconds hold Isometric Shoulder Flexion at Wall - 2 x daily - 1 sets - 10 reps - 5 seconds hold Isometric Shoulder Extension at Wall - 2 x daily - 1 sets - 10 reps - 5 seconds hold Isometric Shoulder Abduction at Wall - 2 x daily - 1 sets - 10 reps - 5 seconds hold Row with band/cable - 1-2 x daily - 30 reps Supine Shoulder Circles - 1-2 x daily - 2-3 sets - 20 reps Supine Single Arm Shoulder Protraction - 1-2 x daily - 2 sets - 15 reps   PT Education - 10/18/19 1524    Education Details  Exercise purpose/form. Self management techniques.    Person(s) Educated  Patient    Methods  Explanation;Demonstration;Tactile cues;Verbal cues    Comprehension  Verbalized understanding;Returned demonstration;Verbal cues required;Tactile cues required;Need further instruction       PT Short Term Goals - 09/27/19 1951      PT SHORT TERM GOAL #1   Title  Be independent with initial home exercise program for self-management of symptoms.    Baseline  Initial HEP provided at IE (09/13/2019);    Time  2    Period  Weeks    Status  Achieved    Target Date  09/27/19        PT Long Term Goals -  10/18/19 1732      PT LONG TERM GOAL #1   Title  Be independent with a long-term home exercise program for self-management of symptoms.    Baseline  Initial HEP provided at IE (09/13/2019); currently participating in appropriate HEP (10/18/2019);    Time  12    Period  Weeks    Status  Partially Met    Target Date  12/06/19  PT LONG TERM GOAL #2   Title  Demonstrate improved FOTO score to equal or greater than 65 to demonstrate improvement in overall condition and self-reported functional ability.    Baseline  FOTO = 43 (09/13/2019); FOTO = 54 10/18/2019);    Time  12    Period  Weeks    Status  New      PT LONG TERM GOAL #3   Title  Have R shoulder AROM equal or greater than L shoulder AROM with no compensations or increase in pain in all planes except intermittent end range discomfort to allow patient to complete valued activities with less difficulty.    Baseline  see objective exam, AROM deferred due to post-op precautions (09/13/2019); able to participate in gentle AROM - see objective (10/18/2019);    Time  12    Period  Weeks    Status  Partially Met    Target Date  12/06/19      PT LONG TERM GOAL #4   Title  Improve R shoulder strength to 4+/5 for improved ability to allow patient to complete valued functional tasks such as reaching overhead and yardwork with less difficulty.    Baseline  testing deferred due to post-op precautions (09/13/2019); now able to complete AROM in limited range 2/5 (10/18/2019);    Time  12    Period  Weeks    Status  Partially Met    Target Date  12/06/19      PT LONG TERM GOAL #5   Title  Complete community, work and/or recreational activities without limitation due to current condition.    Baseline  inability to lift or use R UE for activities such as reaching, lifting, carrying, stabilizing, pushing, pulling, grooming, ADLs, IADLs, yardwork, farmwork, community and social activity (09/13/2019); able to tie shows, improved gromming, ADLs, slight  reaching and light carrying (10/18/2019);    Time  12    Period  Weeks    Status  Partially Met    Target Date  12/06/19            Plan - 10/18/19 1738    Clinical Impression Statement  Patient has attended 10 physical therapy treatment sessions this episode of care and is making appropriate progress towards all goals at this point. Subjectively, pt reports decreased pain and improved ability to do light tasks such as tie his shoes, reach for and carry a coffee cup, etc. Objectively, pt demo significant improvements in FOTO score, AROM, PROM, and strength. He is currently participating well in an appropriate HEP. Pt continues to present  with significant pain, stiffness, muscle performance (strength/power/endurance), coordination, motor control, muscle spasm, ROM impairments that are limiting ability to complete usual activities including things that require him to lift or use R UE for activities such as reaching, lifting, carrying, stabilizing, pushing, pulling, grooming, ADLs, IADLs, yardwork, farmwork, community and social activity without difficulty. Patient will benefit from continued skilled physical therapy intervention to address current body structure impairments and activity limitations to improve function and work towards goals set in current POC in order to return to prior level of function or maximal functional improvement.    Personal Factors and Comorbidities  Age;Behavior Pattern;Comorbidity 3+;Education;Social Background;Time since onset of injury/illness/exacerbation;Past/Current Experience;Fitness    Comorbidities  hx of MI (2000) with stent placement x2 (2017, 2000), skin cancer, carcinoma, melanoma (removed 6 years ago), pre-diabetes, HTN, arthritis, kidney stones, cataract implants, hx of adenomatous colonic polyps. Denies lung problem, seizures, stroke    Examination-Activity  Limitations  Bed Mobility;Bathing;Reach Overhead;Self  Feeding;Carry;Dressing;Hygiene/Grooming;Sleep;Lift;Toileting    Examination-Participation Restrictions  Driving;Community Activity;Cleaning;Yard Work;Meal Prep;Other   inability to lift or use R UE for activities such as reaching, lifting, carrying, stabilizing, pushing, pulling, grooming, ADLs, IADLs, yardwork, farmwork, community and social activity   Stability/Clinical Decision Making  Stable/Uncomplicated    Rehab Potential  Good    PT Frequency  2x / week    PT Duration  12 weeks    PT Treatment/Interventions  ADLs/Self Care Home Management;Aquatic Therapy;Electrical Stimulation;Cryotherapy;Moist Heat;DME Instruction;Therapeutic activities;Therapeutic exercise;Balance training;Neuromuscular re-education;Patient/family education;Manual techniques;Compression bandaging;Scar mobilization;Dry needling;Passive range of motion;Joint Manipulations;Spinal Manipulations    PT Next Visit Plan  Exercises for AAROM, submaximal RTC strengthening, stability, scapular strengthening    PT Home Exercise Plan  Medbridge Access Code: [email or web address]    Consulted and Agree with Plan of Care  Patient       Patient will benefit from skilled therapeutic intervention in order to improve the following deficits and impairments:  Decreased knowledge of use of DME, Decreased skin integrity, Increased fascial restricitons, Pain, Increased muscle spasms, Decreased scar mobility, Decreased coordination, Decreased activity tolerance, Decreased endurance, Decreased range of motion, Decreased strength, Hypomobility, Impaired perceived functional ability, Impaired UE functional use, Impaired flexibility, Increased edema, Decreased safety awareness, Decreased knowledge of precautions  Visit Diagnosis: Chronic right shoulder pain  Stiffness of right shoulder, not elsewhere classified  Muscle weakness (generalized)     Problem List Patient Active Problem List   Diagnosis Date Noted  . Complete tear of right rotator cuff  09/07/2019  . Pain in right shoulder 07/27/2019  . Pre-diabetes 12/21/2015  . Abnormal stress test 12/20/2015  . Hypertension   . Coronary artery disease   . Hyperlipemia     Everlean Alstrom. Graylon Good, PT, DPT 10/18/19, 5:41 PM  [facility] [address] [place], [address] Phone: [phone]   Fax:  [phone]  Name: CORGAN MORMILE MRN: [id number] Date of Birth: 12/09/1940

## 2019-10-20 ENCOUNTER — Other Ambulatory Visit: Payer: Self-pay

## 2019-10-20 ENCOUNTER — Encounter: Payer: Self-pay | Admitting: Physical Therapy

## 2019-10-20 ENCOUNTER — Ambulatory Visit: Payer: Medicare Other | Admitting: Physical Therapy

## 2019-10-20 DIAGNOSIS — M25511 Pain in right shoulder: Secondary | ICD-10-CM | POA: Diagnosis not present

## 2019-10-20 DIAGNOSIS — M6281 Muscle weakness (generalized): Secondary | ICD-10-CM | POA: Diagnosis not present

## 2019-10-20 DIAGNOSIS — M25611 Stiffness of right shoulder, not elsewhere classified: Secondary | ICD-10-CM | POA: Diagnosis not present

## 2019-10-20 DIAGNOSIS — G8929 Other chronic pain: Secondary | ICD-10-CM

## 2019-10-20 NOTE — Therapy (Signed)
[facility] [address] [place], [address] Phone: [phone]   Fax:  [phone]  Physical Therapy Treatment  Patient Details  Name: Kevin Stewart MRN: [id number] Date of Birth: August 01, 1940 Referring Provider (PT): Garald Balding, MD   Encounter Date: 10/20/2019  PT End of Session - 10/20/19 2027    Visit Number  11    Number of Visits  24    Date for PT Re-Evaluation  12/06/19    Authorization Type  [employer] medicare reporting period from 10/20/2019    Authorization Time Period  N/A    Authorization - Visit Number  1    Authorization - Number of Visits  10    Progress Note Due on Visit  20   NEXT FOTO DUE = 3/12   PT Start Time  0950    PT Stop Time  1030    PT Time Calculation (min)  40 min    Activity Tolerance  Patient tolerated treatment well    Behavior During Therapy  [facility] for tasks assessed/performed       Past Medical History:  Diagnosis Date  . Arthritis    "hands" (12/20/2015)  . Basal cell carcinoma, face   . Coronary artery disease    a. 2003 - light MI per patient; s/p Cypher DES to prox LAD in [facility].  Marland Kitchen Hx of adenomatous colonic polyps   . Hyperlipemia   . Hyperlipidemia   . Hypertension   . Kidney stones 1978   "passed them"  . Melanoma of right side of neck ([place])    removed posterior neck [id number]/312013  . Migraine    "stopped in 2000 when I had my heart attack"  . Myocardial infarction ([place]) 2000  . Pre-diabetes    checks cbg q month  . Rotator cuff tear, right   . Wears dentures    upper-lower partial  . Wears glasses     Past Surgical History:  Procedure Laterality Date  . CARDIAC CATHETERIZATION N/A 12/20/2015   Procedure: Left Heart Cath and Coronary Angiography;  Surgeon: Belva Crome, MD;  Location: [facility];  Service: Cardiovascular;  Laterality: N/A;  . CARDIAC CATHETERIZATION N/A 12/20/2015   Procedure: Coronary Stent Intervention;  Surgeon:  Belva Crome, MD;  Location: [facility];  Service: Cardiovascular;  Laterality: N/A;  . CATARACT EXTRACTION W/ INTRAOCULAR LENS  IMPLANT, BILATERAL Bilateral 2011  . CIRCUMCISION  age 72  . COLONOSCOPY    . CORONARY ANGIOPLASTY WITH STENT PLACEMENT  2000   "1 stent"  . CORONARY ANGIOPLASTY WITH STENT PLACEMENT  12/20/2015   "1 stent"  . CYSTOSCOPY/URETEROSCOPY/HOLMIUM LASER/STENT PLACEMENT Right 08/03/2019   Procedure: CYSTOSCOPY/RETROGRADE/URETEROSCOPY//STENT PLACEMENT;  Surgeon: Ceasar Mons, MD;  Location: [facility];  Service: Urology;  Laterality: Right;  . HEMORRHOID SURGERY     "burned them off"  . MELANOMA EXCISION WITH SENTINEL LYMPH NODE BIOPSY  07/21/2012   Procedure: MELANOMA EXCISION WITH SENTINEL LYMPH NODE BIOPSY;  Surgeon: Rozetta Nunnery, MD;  Location: [facility];  Service: General;  Laterality: N/A;  melanoma excision with sentinel node biopsy   posterior right neck  . TONSILLECTOMY  1961    There were no vitals filed for this visit.    Subjective Assessment - 10/20/19 2026    Subjective  Patient reports he is feeling well today and only has pain in his arm when he tries to lift it.  Reports he was a bit sore for a few hours following last treatment session. Tried rolling ontly his R shoulder and it wasn't as bad as he thought it would be but didn't want to sleep there. [place]ed [place] the post office without his sling and it made his shoulder feel better.    Pertinent History  Patient is a 79 y.o. male who presents to outpatient physical therapy with a referral for medical diagnosis chonic R shoulder pain, nontraumatic complete tear of R rotator cuff. This patient's chief complaints consist of R shoulder pain, stiffness, weakness, surgical precautions s/p R RTC repair 09/02/2019 leading to the following functional deficits: inability to lift or use R UE for activities such as reaching, lifting, carrying, stabilizing, pushing,  pulling, grooming, ADLs, IADLs, yardwork, farmwork, community and social activity. Relevant past medical history and comorbidities include hx of MI (2000) with stent placement x2 (2017, 2000), skin cancer, carcinoma, melanoma (removed 6 years ago), pre-diabetes, HTN, arthritis, kidney stones, cataract implants, hx of adenomatous colonic polyps. Denies lung problem, seizures, stroke.   difficulty with push leaf blower, difficulty sleeping, any use of the R arm (ADLs, IADLs, social and community activities, pushing, pulling, stabilizing, lifting, carrying, yardwork, brushhogging, using tractor, etc).   Limitations  House hold activities;Lifting;Other (comment)    Diagnostic tests  Pre-Op MRI report 08/17/2019: "IMPRESSION:1. Supraspinatus tendinosis with full-thickness non-retracted tearof the anterior tendon fibers in the region of the critical zone.2. Subscapularis tendinosis with partial-thickness tearing.3. Intra-articular biceps tendinosis with partial tearing.4. Glenohumeral and acromioclavicular osteoarthritis."    Patient Stated Goals  get better    Currently in Pain?  No/denies    Pain Onset  1 to 4 weeks ago       OBJECTIVE  FOTO = 54 (10/18/2019)  R shoulder PROM - flexion: 150* - abduction: 112* - ER: 65* (at near 90 degrees abduction)  TREATMENT:  Manual therapy:to reduce pain and tissue tension, improve range of motion, neuromodulation, in order to promote improved ability to complete functional activities. - supine PROM as tolerated R shoulderflexion,abduction, ER,IRx20each direction. - gentle R shoulder distractionand shakingto decrease tension.  Therapeutic exercise:to centralize symptoms and improve ROM, strength, muscular endurance, and activity tolerance required for successful completion of functional activities. - supine AAROM flexion with stick, 5 second hold, x20 - sidelying R shoulder ER AROM with towel roll under arm, x10  - standing dynamic isometric R  shoulder IR and ER with towel roll under arm and yellow theraband - standing dynamic R shoulder IR and ER with towel roll under arm, 2x10 each side.  - education about possible progression to dynamic IR/ER at home next session based on how he feels after this session.   HOME EXERCISE PROGRAM Access Code: [email or web address] URL: [email or web address] Date: 10/13/2019 Prepared by: Rosita Kea  Exercises Standing Shoulder Shrug Circles AROM Backward - 2-3 x daily - 20 reps Standing Shoulder Shrug Circles AROM Forward - 2-3 x daily - 20 reps Supine Shoulder Flexion Extension AAROM with Dowel - 1 x daily - 20 reps - 5 seconds hold Standing Isometric Shoulder External Rotation with Doorway and Towel Roll - 2 x daily - 1 sets - 10 reps - 5 seconds hold Standing Isometric Shoulder Internal Rotation with Towel Roll at Doorway - 2 x daily - 1 sets - 10 reps - 5 seconds hold Isometric Shoulder Flexion at Wall - 2 x daily - 1 sets - 10 reps - 5 seconds hold Isometric Shoulder Extension at Wall - 2 x daily -  1 sets - 10 reps - 5 seconds hold Isometric Shoulder Abduction at Wall - 2 x daily - 1 sets - 10 reps - 5 seconds hold Row with band/cable - 1-2 x daily - 30 reps Supine Shoulder Circles - 1-2 x daily - 2-3 sets - 20 reps Supine Single Arm Shoulder Protraction - 1-2 x daily - 2 sets - 15 reps   PT Education - 10/20/19 2027    Education Details  Exercise purpose/form. Self management techniques.    Person(s) Educated  Patient    Methods  Explanation;Demonstration;Tactile cues;Verbal cues    Comprehension  Verbalized understanding;Returned demonstration;Verbal cues required;Tactile cues required;Need further instruction       PT Short Term Goals - 09/27/19 1951      PT SHORT TERM GOAL #1   Title  Be independent with initial home exercise program for self-management of symptoms.    Baseline  Initial HEP provided at IE (09/13/2019);    Time  2    Period  Weeks    Status  Achieved     Target Date  09/27/19        PT Long Term Goals - 10/18/19 1732      PT LONG TERM GOAL #1   Title  Be independent with a long-term home exercise program for self-management of symptoms.    Baseline  Initial HEP provided at IE (09/13/2019); currently participating in appropriate HEP (10/18/2019);    Time  12    Period  Weeks    Status  Partially Met    Target Date  12/06/19      PT LONG TERM GOAL #2   Title  Demonstrate improved FOTO score to equal or greater than 65 to demonstrate improvement in overall condition and self-reported functional ability.    Baseline  FOTO = 43 (09/13/2019); FOTO = 54 10/18/2019);    Time  12    Period  Weeks    Status  New      PT LONG TERM GOAL #3   Title  Have R shoulder AROM equal or greater than L shoulder AROM with no compensations or increase in pain in all planes except intermittent end range discomfort to allow patient to complete valued activities with less difficulty.    Baseline  see objective exam, AROM deferred due to post-op precautions (09/13/2019); able to participate in gentle AROM - see objective (10/18/2019);    Time  12    Period  Weeks    Status  Partially Met    Target Date  12/06/19      PT LONG TERM GOAL #4   Title  Improve R shoulder strength to 4+/5 for improved ability to allow patient to complete valued functional tasks such as reaching overhead and yardwork with less difficulty.    Baseline  testing deferred due to post-op precautions (09/13/2019); now able to complete AROM in limited range 2/5 (10/18/2019);    Time  12    Period  Weeks    Status  Partially Met    Target Date  12/06/19      PT LONG TERM GOAL #5   Title  Complete community, work and/or recreational activities without limitation due to current condition.    Baseline  inability to lift or use R UE for activities such as reaching, lifting, carrying, stabilizing, pushing, pulling, grooming, ADLs, IADLs, yardwork, farmwork, community and social activity (09/13/2019);  able to tie shows, improved gromming, ADLs, slight reaching and light carrying (10/18/2019);  Time  12    Period  Weeks    Status  Partially Met    Target Date  12/06/19            Plan - 10/20/19 2032    Clinical Impression Statement  Patient tolerated treatment well overall with end range pain that resolved with rest during PROM stretches. Was able to progress to submaximal rotator cuff exercises but did not add to HEP to ensure pt has positive response and is able to moderate force appropriately with good form and would benefit from supervised practice longer. Patient would benefit from continued management of limiting condition by skilled physical therapist to address remaining impairments and functional limitations to work towards stated goals and return to PLOF or maximal functional independence.    Personal Factors and Comorbidities  Age;Behavior Pattern;Comorbidity 3+;Education;Social Background;Time since onset of injury/illness/exacerbation;Past/Current Experience;Fitness    Comorbidities  hx of MI (2000) with stent placement x2 (2017, 2000), skin cancer, carcinoma, melanoma (removed 6 years ago), pre-diabetes, HTN, arthritis, kidney stones, cataract implants, hx of adenomatous colonic polyps. Denies lung problem, seizures, stroke    Examination-Activity Limitations  Bed Mobility;Bathing;Reach Overhead;Self Feeding;Carry;Dressing;Hygiene/Grooming;Sleep;Lift;Toileting    Examination-Participation Restrictions  Driving;Community Activity;Cleaning;Yard Work;Meal Prep;Other   inability to lift or use R UE for activities such as reaching, lifting, carrying, stabilizing, pushing, pulling, grooming, ADLs, IADLs, yardwork, farmwork, community and social activity   Stability/Clinical Decision Making  Stable/Uncomplicated    Rehab Potential  Good    PT Frequency  2x / week    PT Duration  12 weeks    PT Treatment/Interventions  ADLs/Self Care Home Management;Aquatic Therapy;Electrical  Stimulation;Cryotherapy;Moist Heat;DME Instruction;Therapeutic activities;Therapeutic exercise;Balance training;Neuromuscular re-education;Patient/family education;Manual techniques;Compression bandaging;Scar mobilization;Dry needling;Passive range of motion;Joint Manipulations;Spinal Manipulations    PT Next Visit Plan  Exercises for AAROM, submaximal RTC strengthening, stability, scapular strengthening    PT Home Exercise Plan  Medbridge Access Code: [email or web address]    Consulted and Agree with Plan of Care  Patient       Patient will benefit from skilled therapeutic intervention in order to improve the following deficits and impairments:  Decreased knowledge of use of DME, Decreased skin integrity, Increased fascial restricitons, Pain, Increased muscle spasms, Decreased scar mobility, Decreased coordination, Decreased activity tolerance, Decreased endurance, Decreased range of motion, Decreased strength, Hypomobility, Impaired perceived functional ability, Impaired UE functional use, Impaired flexibility, Increased edema, Decreased safety awareness, Decreased knowledge of precautions  Visit Diagnosis: Chronic right shoulder pain  Stiffness of right shoulder, not elsewhere classified  Muscle weakness (generalized)     Problem List Patient Active Problem List   Diagnosis Date Noted  . Complete tear of right rotator cuff 09/07/2019  . Pain in right shoulder 07/27/2019  . Pre-diabetes 12/21/2015  . Abnormal stress test 12/20/2015  . Hypertension   . Coronary artery disease   . Hyperlipemia     Everlean Alstrom. Graylon Good, PT, DPT 10/20/19, 8:33 PM  [facility] [address] [place], [address] Phone: [phone]   Fax:  [phone]  Name: Kevin Stewart MRN: [id number] Date of Birth: Mar 24, 1941

## 2019-10-25 ENCOUNTER — Ambulatory Visit: Payer: Medicare Other | Attending: Orthopaedic Surgery

## 2019-10-25 ENCOUNTER — Other Ambulatory Visit: Payer: Self-pay

## 2019-10-25 DIAGNOSIS — M6281 Muscle weakness (generalized): Secondary | ICD-10-CM

## 2019-10-25 DIAGNOSIS — M25611 Stiffness of right shoulder, not elsewhere classified: Secondary | ICD-10-CM

## 2019-10-25 DIAGNOSIS — G8929 Other chronic pain: Secondary | ICD-10-CM

## 2019-10-25 DIAGNOSIS — M25511 Pain in right shoulder: Secondary | ICD-10-CM | POA: Insufficient documentation

## 2019-10-25 NOTE — Therapy (Signed)
[facility] [address] [place], [address] Phone: [phone]   Fax:  [phone]  Physical Therapy Treatment  Patient Details  Name: Kevin Stewart MRN: [id number] Date of Birth: 1941/05/24 Referring Provider (PT): Garald Balding, MD   Encounter Date: 10/25/2019  PT End of Session - 10/25/19 1407    Visit Number  12    Number of Visits  24    Date for PT Re-Evaluation  12/06/19    Authorization Type  [employer] medicare reporting period from 10/20/2019    Authorization Time Period  N/A    Authorization - Visit Number  2    Authorization - Number of Visits  10    Progress Note Due on Visit  20    PT Start Time  1017    PT Stop Time  1425    PT Time Calculation (min)  40 min    Activity Tolerance  Patient tolerated treatment well;No increased pain    Behavior During Therapy  WFL for tasks assessed/performed       Past Medical History:  Diagnosis Date  . Arthritis    "hands" (12/20/2015)  . Basal cell carcinoma, face   . Coronary artery disease    a. 2003 - light MI per patient; s/p Cypher DES to prox LAD in [facility].  Marland Kitchen Hx of adenomatous colonic polyps   . Hyperlipemia   . Hyperlipidemia   . Hypertension   . Kidney stones 1978   "passed them"  . Melanoma of right side of neck ([place])    removed posterior neck 12/312013  . Migraine    "stopped in 2000 when I had my heart attack"  . Myocardial infarction ([place]) 2000  . Pre-diabetes    checks cbg q month  . Rotator cuff tear, right   . Wears dentures    upper-lower partial  . Wears glasses     Past Surgical History:  Procedure Laterality Date  . CARDIAC CATHETERIZATION N/A 12/20/2015   Procedure: Left Heart Cath and Coronary Angiography;  Surgeon: Belva Crome, MD;  Location: [place] [facility];  Service: Cardiovascular;  Laterality: N/A;  . CARDIAC CATHETERIZATION N/A 12/20/2015   Procedure: Coronary Stent Intervention;  Surgeon: Belva Crome, MD;  Location: [place] [facility];  Service: Cardiovascular;  Laterality: N/A;  . CATARACT EXTRACTION W/ INTRAOCULAR LENS  IMPLANT, BILATERAL Bilateral 2011  . CIRCUMCISION  age 9  . COLONOSCOPY    . CORONARY ANGIOPLASTY WITH STENT PLACEMENT  2000   "1 stent"  . CORONARY ANGIOPLASTY WITH STENT PLACEMENT  12/20/2015   "1 stent"  . CYSTOSCOPY/URETEROSCOPY/HOLMIUM LASER/STENT PLACEMENT Right 08/03/2019   Procedure: CYSTOSCOPY/RETROGRADE/URETEROSCOPY//STENT PLACEMENT;  Surgeon: Ceasar Mons, MD;  Location: [facility];  Service: Urology;  Laterality: Right;  . HEMORRHOID SURGERY     "burned them off"  . MELANOMA EXCISION WITH SENTINEL LYMPH NODE BIOPSY  07/21/2012   Procedure: MELANOMA EXCISION WITH SENTINEL LYMPH NODE BIOPSY;  Surgeon: Rozetta Nunnery, MD;  Location: [place] [facility];  Service: General;  Laterality: N/A;  melanoma excision with sentinel node biopsy   posterior right neck  . TONSILLECTOMY  1961    There were no vitals filed for this visit.  Subjective Assessment - 10/25/19 1405    Subjective  Pt doing well this date. [place] for activity, keeps his sling donned as per MD request.    Pertinent History  Patient is a 79 y.o. male who presents to outpatient physical therapy with a referral for medical diagnosis chonic R shoulder pain, nontraumatic complete tear of R rotator cuff. This patient's chief complaints consist of R shoulder pain, stiffness, weakness, surgical precautions s/p R RTC repair 09/02/2019 leading to the following functional deficits: inability to lift or use R UE for activities such as reaching, lifting, carrying, stabilizing, pushing, pulling, grooming, ADLs, IADLs, yardwork, farmwork, community and social activity. Relevant past medical history and comorbidities include hx of MI (2000) with stent placement x2 (2017, 2000), skin cancer, carcinoma, melanoma (removed 6 years ago), pre-diabetes, HTN,  arthritis, kidney stones, cataract implants, hx of adenomatous colonic polyps. Denies lung problem, seizures, stroke.    Diagnostic tests  Pre-Op MRI report 08/17/2019: "IMPRESSION:1. Supraspinatus tendinosis with full-thickness non-retracted tearof the anterior tendon fibers in the region of the critical zone.2. Subscapularis tendinosis with partial-thickness tearing.3. Intra-articular biceps tendinosis with partial tearing.4. Glenohumeral and acromioclavicular osteoarthritis."    Patient Stated Goals  get better    Currently in Pain?  Yes    Pain Score  3     Pain Orientation  Right   shoulder.      OBJECTIVE   FOTO = 54 (10/18/2019)   R shoulder PROM - flexion: 150* - abduction: 105* - ER: 66* (at near 90 degrees abduction)   TREATMENT:    Manual therapy: to reduce pain and tissue tension, improve range of motion, neuromodulation, in order to promote improved ability to complete functional activities. - supine PROM as tolerated R shoulder flexion, abduction, ER, IR x20 each direction.  - gentle R shoulder distraction and shaking to decrease tension.  - manual release to posterior axilla ART c ABDCT    Therapeutic exercise: to centralize symptoms and improve ROM, strength, muscular endurance, and activity tolerance required for successful completion of functional activities.  - supine AAROM flexion with stick, 5 second hold, x20 - side lying R shoulder ER AROM with towel roll under arm, 15x3secH - supine RUE IR isometric into belly (hand on towel) 15x3secH - supine isometric BUE extension into table 1x15x3secH, multimodal cues for paired scapular retraction, difficult - supine wand assisted AA/ROM RUE abduction (unable to perform despite cues)  -seated scapular elevation 1x15x3secH BUE -seated scapular retraction 1x15x3secH BUE    HOME EXERCISE PROGRAM Access Code: [email or web address] URL: [email or web address] Date: 10/13/2019 Prepared by: Rosita Kea    Exercises Standing Shoulder Shrug Circles AROM Backward - 2-3 x daily - 20 reps Standing Shoulder Shrug Circles AROM Forward - 2-3 x daily - 20 reps Supine Shoulder Flexion Extension AAROM with [place]el - 1 x daily - 20 reps - 5 seconds hold Standing Isometric Shoulder External Rotation with Doorway and Towel Roll - 2 x daily - 1 sets - 10 reps - 5 seconds hold Standing Isometric Shoulder Internal Rotation with Towel Roll at Doorway - 2 x daily - 1 sets - 10 reps - 5 seconds hold Isometric Shoulder Flexion at Wall - 2 x daily - 1 sets - 10 reps - 5 seconds hold Isometric Shoulder Extension at Wall - 2 x daily - 1 sets - 10 reps - 5 seconds hold Isometric Shoulder Abduction at Wall - 2 x daily - 1 sets - 10 reps - 5 seconds hold Row with band/cable - 1-2 x daily - 30 reps Supine Shoulder Circles - 1-2 x daily - 2-3 sets - 20 reps Supine Single Arm Shoulder Protraction - 1-2 x daily - 2 sets - 15 reps  PT Short Term Goals - 09/27/19 1951      PT SHORT TERM GOAL #1   Title  Be independent with initial home exercise program for self-management of symptoms.    Baseline  Initial HEP provided at IE (09/13/2019);    Time  2    Period  Weeks    Status  Achieved    Target Date  09/27/19        PT Long Term Goals - 10/18/19 1732      PT LONG TERM GOAL #1   Title  Be independent with a long-term home exercise program for self-management of symptoms.    Baseline  Initial HEP provided at IE (09/13/2019); currently participating in appropriate HEP (10/18/2019);    Time  12    Period  Weeks    Status  Partially Met    Target Date  12/06/19      PT LONG TERM GOAL #2   Title  Demonstrate improved FOTO score to equal or greater than 65 to demonstrate improvement in overall condition and self-reported functional ability.    Baseline  FOTO = 43 (09/13/2019); FOTO = 54 10/18/2019);    Time  12    Period  Weeks    Status  New      PT LONG TERM GOAL #3   Title  Have R shoulder AROM equal or greater  than L shoulder AROM with no compensations or increase in pain in all planes except intermittent end range discomfort to allow patient to complete valued activities with less difficulty.    Baseline  see objective exam, AROM deferred due to post-op precautions (09/13/2019); able to participate in gentle AROM - see objective (10/18/2019);    Time  12    Period  Weeks    Status  Partially Met    Target Date  12/06/19      PT LONG TERM GOAL #4   Title  Improve R shoulder strength to 4+/5 for improved ability to allow patient to complete valued functional tasks such as reaching overhead and yardwork with less difficulty.    Baseline  testing deferred due to post-op precautions (09/13/2019); now able to complete AROM in limited range 2/5 (10/18/2019);    Time  12    Period  Weeks    Status  Partially Met    Target Date  12/06/19      PT LONG TERM GOAL #5   Title  Complete community, work and/or recreational activities without limitation due to current condition.    Baseline  inability to lift or use R UE for activities such as reaching, lifting, carrying, stabilizing, pushing, pulling, grooming, ADLs, IADLs, yardwork, farmwork, community and social activity (09/13/2019); able to tie shows, improved gromming, ADLs, slight reaching and light carrying (10/18/2019);    Time  12    Period  Weeks    Status  Partially Met    Target Date  12/06/19            Plan - 10/25/19 1411    Clinical Impression Statement In general, patient demonstrating good tolerance to therapy session this date, reasonable accommodations are alllowed in-session to allow adequate rest between activities as needed. All interventional executed without any exacerbation of pain or other symptoms. Pt given intermittent multimodal cues to teach best possible form with exercises. Pt struggles with scapular retraction when paired with shoulder extension. He also struggles with wand-AA/ROM of shoulder ABDCT. Pt remains veyr movtiated to  work hard and have a  great outcome. Pt continues to make steady progress toward most goals. No home exercise updates made at this time.    Personal Factors and Comorbidities  Age;Behavior Pattern;Comorbidity 3+;Education;Social Background;Time since onset of injury/illness/exacerbation;Past/Current Experience;Fitness    Comorbidities  hx of MI (2000) with stent placement x2 (2017, 2000), skin cancer, carcinoma, melanoma (removed 6 years ago), pre-diabetes, HTN, arthritis, kidney stones, cataract implants, hx of adenomatous colonic polyps. Denies lung problem, seizures, stroke    Examination-Activity Limitations  Bed Mobility;Bathing;Reach Overhead;Self Feeding;Carry;Dressing;Hygiene/Grooming;Sleep;Lift;Toileting    Examination-Participation Restrictions  Driving;Community Activity;Cleaning;Yard Work;Meal Prep;Other    Stability/Clinical Decision Making  Stable/Uncomplicated    Clinical Decision Making  Low    Rehab Potential  Good    PT Frequency  2x / week    PT Duration  12 weeks    PT Treatment/Interventions  ADLs/Self Care Home Management;Aquatic Therapy;Electrical Stimulation;Cryotherapy;Moist Heat;DME Instruction;Therapeutic activities;Therapeutic exercise;Balance training;Neuromuscular re-education;Patient/family education;Manual techniques;Compression bandaging;Scar mobilization;Dry needling;Passive range of motion;Joint Manipulations;Spinal Manipulations    PT Next Visit Plan  Exercises for AAROM, submaximal RTC strengthening, stability, scapular strengthening    PT Home Exercise Plan  Medbridge Access Code: [email or web address]    Consulted and Agree with Plan of Care  Patient       Patient will benefit from skilled therapeutic intervention in order to improve the following deficits and impairments:  Decreased knowledge of use of DME, Decreased skin integrity, Increased fascial restricitons, Pain, Increased muscle spasms, Decreased scar mobility, Decreased coordination, Decreased activity tolerance,  Decreased endurance, Decreased range of motion, Decreased strength, Hypomobility, Impaired perceived functional ability, Impaired UE functional use, Impaired flexibility, Increased edema, Decreased safety awareness, Decreased knowledge of precautions  Visit Diagnosis: Chronic right shoulder pain  Stiffness of right shoulder, not elsewhere classified  Muscle weakness (generalized)     Problem List Patient Active Problem List   Diagnosis Date Noted  . Complete tear of right rotator cuff 09/07/2019  . Pain in right shoulder 07/27/2019  . Pre-diabetes 12/21/2015  . Abnormal stress test 12/20/2015  . Hypertension   . Coronary artery disease   . Hyperlipemia    2:28 PM, 10/25/19 Etta Grandchild, PT, DPT Physical Therapist - [place] [phone] (Office)    Armine Rizzolo C 10/25/2019, 2:14 PM  [facility] [address], [place], [address] Phone: [phone]   Fax:  [phone]  Name: KHOA OPDAHL MRN: [id number] Date of Birth: [birth year]

## 2019-10-26 ENCOUNTER — Encounter: Payer: Self-pay | Admitting: Orthopaedic Surgery

## 2019-10-26 ENCOUNTER — Ambulatory Visit (INDEPENDENT_AMBULATORY_CARE_PROVIDER_SITE_OTHER): Payer: Medicare Other | Admitting: Orthopaedic Surgery

## 2019-10-26 VITALS — Ht 71.0 in | Wt 165.0 lb

## 2019-10-26 DIAGNOSIS — M75121 Complete rotator cuff tear or rupture of right shoulder, not specified as traumatic: Secondary | ICD-10-CM

## 2019-10-26 NOTE — Progress Notes (Signed)
Office Visit Note   Patient: Kevin Stewart           Date of Birth: 02-13-1941           MRN: [id number] Visit Date: 10/26/2019              Requested by: [name], NP [address] [place],  [place] [address] PCP: [name], NP   Assessment & Plan: Visit Diagnoses:  1. Nontraumatic complete tear of right rotator cuff     Plan: 6 weeks status post rotator cuff tear repair right shoulder with biceps tenodesis.  Doing well.  Will finish his course of physical therapy at the end of April.  Not having any significant pain.  Feels like his function is better than it was before surgery.  He is easy for him to brush his teeth, take a shower and reach for objects overhead without pain.  Still little sore and weak.  Discontinue the sling continue to work on exercises and return in 1 month  Follow-Up Instructions: Return in about 1 month (around 11/25/2019).   Orders:  No orders of the defined types were placed in this encounter.  No orders of the defined types were placed in this encounter.     Procedures: No procedures performed   Clinical Data: No additional findings.   Subjective: Chief Complaint  Patient presents with  . Right Shoulder - Pain, Follow-up    Right shoulder scope DOS 09/02/2019  Patient presents today for follow up on his right shoulder. He had a rotator cuff tear repair on 09/02/2019. He has been going to physical therapy twice weekly. He said that he still has soreness in his shoulder. He is right hand dominant. He is not taking anything for pain. He is still wearing his sling.   HPI  Review of Systems   Objective: Vital Signs: Ht 5\' 11"  (1.803 m)   Wt 165 lb (74.8 kg)   BMI 23.01 kg/m   Physical Exam  Ortho Exam right shoulder incisions are healing without problem.  He is able all raise his arm almost fully overhead.  Passively I can just about raise his arm fully overhead.  Still has some loss of internal rotation.  Biceps appears to be in its  normal position.  Good grip and good release  Specialty Comments:  No specialty comments available.  Imaging: No results found.   PMFS History: Patient Active Problem List   Diagnosis Date Noted  . Complete tear of right rotator cuff 09/07/2019  . Pain in right shoulder 07/27/2019  . Pre-diabetes 12/21/2015  . Abnormal stress test 12/20/2015  . Hypertension   . Coronary artery disease   . Hyperlipemia    Past Medical History:  Diagnosis Date  . Arthritis    "hands" (12/20/2015)  . Basal cell carcinoma, face   . Coronary artery disease    a. 2003 - light MI per patient; s/p Cypher DES to prox LAD in [facility].  Marland Kitchen Hx of adenomatous colonic polyps   . Hyperlipemia   . Hyperlipidemia   . Hypertension   . Kidney stones 1978   "passed them"  . Melanoma of right side of neck ([place])    removed posterior neck 12/312013  . Migraine    "stopped in 2000 when I had my heart attack"  . Myocardial infarction ([place]) 2000  . Pre-diabetes    checks cbg q month  . Rotator cuff tear, right   . Wears  dentures    upper-lower partial  . Wears glasses     Family History  Problem Relation Age of Onset  . Heart attack Mother 51  . Cancer - Other Father 40       pancreatic  . Heart attack Brother 39  . Cancer - Colon Brother 23  . Cancer - Other Sister 72    Past Surgical History:  Procedure Laterality Date  . CARDIAC CATHETERIZATION N/A 12/20/2015   Procedure: Left Heart Cath and Coronary Angiography;  Surgeon: Belva Crome, MD;  Location: [place] [facility];  Service: Cardiovascular;  Laterality: N/A;  . CARDIAC CATHETERIZATION N/A 12/20/2015   Procedure: Coronary Stent Intervention;  Surgeon: Belva Crome, MD;  Location: [place];  Service: Cardiovascular;  Laterality: N/A;  . CATARACT EXTRACTION W/ INTRAOCULAR LENS  IMPLANT, BILATERAL Bilateral 2011  . CIRCUMCISION  age 86  . COLONOSCOPY    . CORONARY ANGIOPLASTY WITH STENT PLACEMENT  2000   "1 stent"  . CORONARY  ANGIOPLASTY WITH STENT PLACEMENT  12/20/2015   "1 stent"  . CYSTOSCOPY/URETEROSCOPY/HOLMIUM LASER/STENT PLACEMENT Right 08/03/2019   Procedure: CYSTOSCOPY/RETROGRADE/URETEROSCOPY//STENT PLACEMENT;  Surgeon: Ceasar Mons, MD;  [facility];  Service: Urology;  Laterality: Right;  . HEMORRHOID SURGERY     "burned them off"  . MELANOMA EXCISION WITH SENTINEL LYMPH NODE BIOPSY  07/21/2012   Procedure: MELANOMA EXCISION WITH SENTINEL LYMPH NODE BIOPSY;  Surgeon: Rozetta Nunnery, MD;  Location: [place];  Service: General;  Laterality: N/A;  melanoma excision with sentinel node biopsy   posterior right neck  . TONSILLECTOMY  1961   Social History   Occupational History  . 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 Smoking status: Former Smoker    Packs/day: 2.00    Years: 10.00    Pack years: 20.00    Types: Cigarettes    Quit date: 07/10/1973    Years since quitting: 46.3  . Smokeless tobacco: Former Systems developer    Types: Chew  . Tobacco comment: "chewed a little; stopped in 06/1973"  Substance and Sexual Activity  . Alcohol use: Yes    Comment: 12/20/2015 'quit in ~ 2012-2013"  . Drug use: No  . Sexual activity: Never

## 2019-10-27 ENCOUNTER — Other Ambulatory Visit: Payer: Self-pay

## 2019-10-27 ENCOUNTER — Ambulatory Visit: Payer: Medicare Other

## 2019-10-27 DIAGNOSIS — M6281 Muscle weakness (generalized): Secondary | ICD-10-CM

## 2019-10-27 DIAGNOSIS — L905 Scar conditions and fibrosis of skin: Secondary | ICD-10-CM | POA: Diagnosis not present

## 2019-10-27 DIAGNOSIS — L570 Actinic keratosis: Secondary | ICD-10-CM | POA: Diagnosis not present

## 2019-10-27 DIAGNOSIS — G8929 Other chronic pain: Secondary | ICD-10-CM

## 2019-10-27 DIAGNOSIS — M25511 Pain in right shoulder: Secondary | ICD-10-CM | POA: Diagnosis not present

## 2019-10-27 DIAGNOSIS — L814 Other melanin hyperpigmentation: Secondary | ICD-10-CM | POA: Diagnosis not present

## 2019-10-27 DIAGNOSIS — M25611 Stiffness of right shoulder, not elsewhere classified: Secondary | ICD-10-CM | POA: Diagnosis not present

## 2019-10-27 DIAGNOSIS — D229 Melanocytic nevi, unspecified: Secondary | ICD-10-CM | POA: Diagnosis not present

## 2019-10-27 DIAGNOSIS — L821 Other seborrheic keratosis: Secondary | ICD-10-CM | POA: Diagnosis not present

## 2019-10-27 DIAGNOSIS — L819 Disorder of pigmentation, unspecified: Secondary | ICD-10-CM | POA: Diagnosis not present

## 2019-10-27 NOTE — Therapy (Signed)
[facility] [address], [place], [address] Phone: [phone]   Fax:  [phone]  Physical Therapy Treatment  Patient Details  Name: Kevin Stewart MRN: [id number] Date of Birth: 1941/07/17 Referring Provider (PT): Garald Balding, MD   Encounter Date: 10/27/2019  PT End of Session - 10/27/19 1141    Visit Number  13    Number of Visits  24    Date for PT Re-Evaluation  12/06/19    Authorization Type  [employer] medicare reporting period from 10/20/2019    Authorization Time Period  N/A    Authorization - Visit Number  3    Authorization - Number of Visits  10    Progress Note Due on Visit  20    PT Start Time  1117    PT Stop Time  1157    PT Time Calculation (min)  40 min    Activity Tolerance  Patient tolerated treatment well;No increased pain    Behavior During Therapy  WFL for tasks assessed/performed       Past Medical History:  Diagnosis Date  . Arthritis    "hands" (12/20/2015)  . Basal cell carcinoma, face   . Coronary artery disease    a. 2003 - light MI per patient; s/p Cypher DES to prox LAD in [facility].  Marland Kitchen Hx of adenomatous colonic polyps   . Hyperlipemia   . Hyperlipidemia   . Hypertension   . Kidney stones 1978   "passed them"  . Melanoma of right side of neck ([place])    removed posterior neck 12/312013  . Migraine    "stopped in 2000 when I had my heart attack"  . Myocardial infarction ([place]) 2000  . Pre-diabetes    checks cbg q month  . Rotator cuff tear, right   . Wears dentures    upper-lower partial  . Wears glasses     Past Surgical History:  Procedure Laterality Date  . CARDIAC CATHETERIZATION N/A 12/20/2015   Procedure: Left Heart Cath and Coronary Angiography;  Surgeon: Belva Crome, MD;  Location: [facility];  Service: Cardiovascular;  Laterality: N/A;  . CARDIAC CATHETERIZATION N/A 12/20/2015   Procedure: Coronary Stent Intervention;  Surgeon: Belva Crome, MD;  Location: [facility];  Service: Cardiovascular;  Laterality: N/A;  . CATARACT EXTRACTION W/ INTRAOCULAR LENS  IMPLANT, BILATERAL Bilateral 2011  . CIRCUMCISION  age 33  . COLONOSCOPY    . CORONARY ANGIOPLASTY WITH STENT PLACEMENT  2000   "1 stent"  . CORONARY ANGIOPLASTY WITH STENT PLACEMENT  12/20/2015   "1 stent"  . CYSTOSCOPY/URETEROSCOPY/HOLMIUM LASER/STENT PLACEMENT Right 08/03/2019   Procedure: CYSTOSCOPY/RETROGRADE/URETEROSCOPY//STENT PLACEMENT;  Surgeon: Ceasar Mons, MD;  Location[facility];  Service: Urology;  Laterality: Right;  . HEMORRHOID SURGERY     "burned them off"  . MELANOMA EXCISION WITH SENTINEL LYMPH NODE BIOPSY  07/21/2012   Procedure: MELANOMA EXCISION WITH SENTINEL LYMPH NODE BIOPSY;  Surgeon: Rozetta Nunnery, MD;  Location: [facility];  Service: General;  Laterality: N/A;  melanoma excision with sentinel node biopsy   posterior right neck  . TONSILLECTOMY  1961    There were no vitals filed for this visit.  Subjective Assessment - 10/27/19 1139    Subjective  Pt doing well today, saw ortho MD yesterday, good report, told to DC sling. Pain at arrival about 4/10. Felt like something moved during stretches yesterday  then he had less pain and better mobility.    Pertinent History  Patient is a 79 y.o. male who presents to outpatient physical therapy with a referral for medical diagnosis chonic R shoulder pain, nontraumatic complete tear of R rotator cuff. This patient's chief complaints consist of R shoulder pain, stiffness, weakness, surgical precautions s/p R RTC repair 09/02/2019 leading to the following functional deficits: inability to lift or use R UE for activities such as reaching, lifting, carrying, stabilizing, pushing, pulling, grooming, ADLs, IADLs, yardwork, farmwork, community and social activity. Relevant past medical history and comorbidities include hx of MI (2000) with stent placement x2  (2017, 2000), skin cancer, carcinoma, melanoma (removed 6 years ago), pre-diabetes, HTN, arthritis, kidney stones, cataract implants, hx of adenomatous colonic polyps. Denies lung problem, seizures, stroke.    Diagnostic tests  Pre-Op MRI report 08/17/2019: "IMPRESSION:1. Supraspinatus tendinosis with full-thickness non-retracted tearof the anterior tendon fibers in the region of the critical zone.2. Subscapularis tendinosis with partial-thickness tearing.3. Intra-articular biceps tendinosis with partial tearing.4. Glenohumeral and acromioclavicular osteoarthritis."    Currently in Pain?  Yes    Pain Score  4    operative shoulder      OBJECTIVE   FOTO = 54 (10/18/2019)   R shoulder PROM - flexion: 150* - abduction: 105* - ER: 66* (at near 90 degrees abduction)   TREATMENT:    Manual therapy: to reduce pain and tissue tension, improve range of motion, neuromodulation, in order to promote improved ability to complete functional activities. - supine PROM as tolerated R shoulder flexion, abduction, ER, IR x20 each direction.  - gentle R shoulder distraction and shaking to decrease tension.  - MFR to infraspinatus, pec major, deltoid   Therapeutic exercise: to centralize symptoms and improve ROM, strength, muscular endurance, and activity tolerance required for successful completion of functional activities.  - supine AAROM flexion with stick, 5 second hold, x20 -standing Rt shoulder ABDCT with TRX walkout 1x15 (appears less successful than desired, would attempt a different  -supine RUE ER belly to // c gravity 1x15x 3secH  -supine shoulder extension isometric into towel 15x3secH -supine short arc flexion A/ROM 0-90 degrees 1x15  - supine isometric BUE extension into table 1x15x3secH, multimodal cues for paired scapular retraction, difficult    HOME EXERCISE PROGRAM Access Code: [email or web address] URL: [email or web address] Date: 10/13/2019 Prepared by: Rosita Kea    Exercises Standing Shoulder Shrug Circles AROM Backward - 2-3 x daily - 20 reps Standing Shoulder Shrug Circles AROM Forward - 2-3 x daily - 20 reps Supine Shoulder Flexion Extension AAROM with Dowel - 1 x daily - 20 reps - 5 seconds hold Standing Isometric Shoulder External Rotation with Doorway and Towel Roll - 2 x daily - 1 sets - 10 reps - 5 seconds hold Standing Isometric Shoulder Internal Rotation with Towel Roll at Doorway - 2 x daily - 1 sets - 10 reps - 5 seconds hold Isometric Shoulder Flexion at Wall - 2 x daily - 1 sets - 10 reps - 5 seconds hold Isometric Shoulder Extension at Wall - 2 x daily - 1 sets - 10 reps - 5 seconds hold Isometric Shoulder Abduction at Wall - 2 x daily - 1 sets - 10 reps - 5 seconds hold Row with band/cable - 1-2 x daily - 30 reps Supine Shoulder Circles - 1-2 x daily - 2-3 sets - 20 reps Supine Single Arm Shoulder Protraction - 1-2 x daily - 2 sets - 15 reps    PT Short Term  Goals - 09/27/19 1951      PT SHORT TERM GOAL #1   Title  Be independent with initial home exercise program for self-management of symptoms.    Baseline  Initial HEP provided at [facility] (09/13/2019);    Time  2    Period  Weeks    Status  Achieved    Target Date  09/27/19        PT Long Term Goals - 10/18/19 1732      PT LONG TERM GOAL #1   Title  Be independent with a long-term home exercise program for self-management of symptoms.    Baseline  Initial HEP provided at [facility] (09/13/2019); currently participating in appropriate HEP (10/18/2019);    Time  12    Period  Weeks    Status  Partially Met    Target Date  12/06/19      PT LONG TERM GOAL #2   Title  Demonstrate improved FOTO score to equal or greater than 65 to demonstrate improvement in overall condition and self-reported functional ability.    Baseline  FOTO = 43 (09/13/2019); FOTO = 54 10/18/2019);    Time  12    Period  Weeks    Status  New      PT LONG TERM GOAL #3   Title  Have R shoulder AROM equal or greater  than L shoulder AROM with no compensations or increase in pain in all planes except intermittent end range discomfort to allow patient to complete valued activities with less difficulty.    Baseline  see objective exam, AROM deferred due to post-op precautions (09/13/2019); able to participate in gentle AROM - see objective (10/18/2019);    Time  12    Period  Weeks    Status  Partially Met    Target Date  12/06/19      PT LONG TERM GOAL #4   Title  Improve R shoulder strength to 4+/5 for improved ability to allow patient to complete valued functional tasks such as reaching overhead and yardwork with less difficulty.    Baseline  testing deferred due to post-op precautions (09/13/2019); now able to complete AROM in limited range 2/5 (10/18/2019);    Time  12    Period  Weeks    Status  Partially Met    Target Date  12/06/19      PT LONG TERM GOAL #5   Title  Complete community, work and/or recreational activities without limitation due to current condition.    Baseline  inability to lift or use R UE for activities such as reaching, lifting, carrying, stabilizing, pushing, pulling, grooming, ADLs, IADLs, yardwork, farmwork, community and social activity (09/13/2019); able to tie shows, improved gromming, ADLs, slight reaching and light carrying (10/18/2019);    Time  12    Period  Weeks    Status  Partially Met    Target Date  12/06/19            Plan - 10/27/19 1141    Clinical Impression Statement In general, patient demonstrating good tolerance to therapy session this date, reasonable accommodations are alllowed in-session to allow adequate rest between activities as needed. All interventional executed without any exacerbation of pain or other symptoms.  Pt given intermittent multimodal cues to teach best possible form with exercises. Pt continues to make steady progress toward most goals. P/ROM remains similar to prior sessions, but in AA/ROM pt able to progress flexion to 155+ degrees.  Still having difficulty finding  a good AA/ROM for ABDCT as pt does better with P/ROM in this plane. No home exercise updates made at this time. Excellent resolution of taut bands noted in pecs and deltoid.     Personal Factors and Comorbidities  Age;Behavior Pattern;Comorbidity 3+;Education;Social Background;Time since onset of injury/illness/exacerbation;Past/Current Experience;Fitness    Comorbidities  hx of MI (2000) with stent placement x2 (2017, 2000), skin cancer, carcinoma, melanoma (removed 6 years ago), pre-diabetes, HTN, arthritis, kidney stones, cataract implants, hx of adenomatous colonic polyps. Denies lung problem, seizures, stroke    Examination-Activity Limitations  Bed Mobility;Bathing;Reach Overhead;Self Feeding;Carry;Dressing;Hygiene/Grooming;Sleep;Lift;Toileting    Examination-Participation Restrictions  Driving;Community Activity;Cleaning;Yard Work;Meal Prep;Other    Stability/Clinical Decision Making  Stable/Uncomplicated    Clinical Decision Making  Low    Rehab Potential  Good    PT Frequency  2x / week    PT Duration  12 weeks    PT Treatment/Interventions  ADLs/Self Care Home Management;Aquatic Therapy;Electrical Stimulation;Cryotherapy;Moist Heat;DME Instruction;Therapeutic activities;Therapeutic exercise;Balance training;Neuromuscular re-education;Patient/family education;Manual techniques;Compression bandaging;Scar mobilization;Dry needling;Passive range of motion;Joint Manipulations;Spinal Manipulations    PT Next Visit Plan  Exercises for AAROM, low to moderate in tensity shoulder strengthening, stability, scapular strengthening    PT Home Exercise Plan  Medbridge Access Code: [email or web address]    Consulted and Agree with Plan of Care  Patient       Patient will benefit from skilled therapeutic intervention in order to improve the following deficits and impairments:  Decreased knowledge of use of DME, Decreased skin integrity, Increased fascial restricitons, Pain, Increased  muscle spasms, Decreased scar mobility, Decreased coordination, Decreased activity tolerance, Decreased endurance, Decreased range of motion, Decreased strength, Hypomobility, Impaired perceived functional ability, Impaired UE functional use, Impaired flexibility, Increased edema, Decreased safety awareness, Decreased knowledge of precautions  Visit Diagnosis: Chronic right shoulder pain  Stiffness of right shoulder, not elsewhere classified  Muscle weakness (generalized)     Problem List Patient Active Problem List   Diagnosis Date Noted  . Complete tear of right rotator cuff 09/07/2019  . Pain in right shoulder 07/27/2019  . Pre-diabetes 12/21/2015  . Abnormal stress test 12/20/2015  . Hypertension   . Coronary artery disease   . Hyperlipemia    11:54 AM, 10/27/19 Etta Grandchild, PT, DPT Physical Therapist - [place] [phone] (Office)    Carmon Sahli C 10/27/2019, 11:46 AM  [facility] [address] [place], [address] Phone: [phone]   Fax:  [phone]  Name: Kevin Stewart MRN: [id number] Date of Birth: 04-21-1941

## 2019-11-01 ENCOUNTER — Encounter: Payer: Self-pay | Admitting: Physical Therapy

## 2019-11-01 ENCOUNTER — Other Ambulatory Visit: Payer: Self-pay

## 2019-11-01 ENCOUNTER — Ambulatory Visit: Payer: Medicare Other | Admitting: Physical Therapy

## 2019-11-01 DIAGNOSIS — M25511 Pain in right shoulder: Secondary | ICD-10-CM

## 2019-11-01 DIAGNOSIS — M25611 Stiffness of right shoulder, not elsewhere classified: Secondary | ICD-10-CM

## 2019-11-01 DIAGNOSIS — M6281 Muscle weakness (generalized): Secondary | ICD-10-CM

## 2019-11-01 DIAGNOSIS — G8929 Other chronic pain: Secondary | ICD-10-CM

## 2019-11-01 NOTE — Therapy (Signed)
[facility] [address], [place], [address] Phone: [phone]   Fax:  [phone]  Physical Therapy Treatment  Patient Details  Name: Kevin Stewart MRN: [id number] Date of Birth: 10/30/1940 Referring Provider (PT): Garald Balding, MD   Encounter Date: 11/01/2019  PT End of Session - 11/01/19 1438    Visit Number  14    Number of Visits  24    Date for PT Re-Evaluation  12/06/19    Authorization Type  [employer] medicare reporting period from 10/20/2019    Authorization Time Period  N/A    Authorization - Visit Number  4    Authorization - Number of Visits  10    Progress Note Due on Visit  20   Next FOTO due = 4/26   PT Start Time  1435    PT Stop Time  1515    PT Time Calculation (min)  40 min    Activity Tolerance  Patient tolerated treatment well    Behavior During Therapy  [facility] for tasks assessed/performed       Past Medical History:  Diagnosis Date  . Arthritis    "hands" (12/20/2015)  . Basal cell carcinoma, face   . Coronary artery disease    a. 2003 - light MI per patient; s/p Cypher DES to prox LAD in [facility].  Marland Kitchen Hx of adenomatous colonic polyps   . Hyperlipemia   . Hyperlipidemia   . Hypertension   . Kidney stones 1978   "passed them"  . Melanoma of right side of neck ([place])    removed posterior neck 12/312013  . Migraine    "stopped in 2000 when I had my heart attack"  . Myocardial infarction ([place]) 2000  . Pre-diabetes    checks cbg q month  . Rotator cuff tear, right   . Wears dentures    upper-lower partial  . Wears glasses     Past Surgical History:  Procedure Laterality Date  . CARDIAC CATHETERIZATION N/A 12/20/2015   Procedure: Left Heart Cath and Coronary Angiography;  Surgeon: Belva Crome, MD;  Location: [facility];  Service: Cardiovascular;  Laterality: N/A;  . CARDIAC CATHETERIZATION N/A 12/20/2015   Procedure: Coronary Stent Intervention;  Surgeon:  Belva Crome, MD;  Location: [facility];  Service: Cardiovascular;  Laterality: N/A;  . CATARACT EXTRACTION W/ INTRAOCULAR LENS  IMPLANT, BILATERAL Bilateral 2011  . CIRCUMCISION  age 37  . COLONOSCOPY    . CORONARY ANGIOPLASTY WITH STENT PLACEMENT  2000   "1 stent"  . CORONARY ANGIOPLASTY WITH STENT PLACEMENT  12/20/2015   "1 stent"  . CYSTOSCOPY/URETEROSCOPY/HOLMIUM LASER/STENT PLACEMENT Right 08/03/2019   Procedure: CYSTOSCOPY/RETROGRADE/URETEROSCOPY//STENT PLACEMENT;  Surgeon: Ceasar Mons, MD;  Location: [facility];  Service: Urology;  Laterality: Right;  . HEMORRHOID SURGERY     "burned them off"  . MELANOMA EXCISION WITH SENTINEL LYMPH NODE BIOPSY  07/21/2012   Procedure: MELANOMA EXCISION WITH SENTINEL LYMPH NODE BIOPSY;  Surgeon: Rozetta Nunnery, MD;  Location: [facility];  Service: General;  Laterality: N/A;  melanoma excision with sentinel node biopsy   posterior right neck  . TONSILLECTOMY  1961    There were no vitals filed for this visit.  Subjective Assessment - 11/01/19 1437    Subjective  Patient reports he has been doing okay. States he has stopped using sling unless sometimes he walked. He was a bit sore  following last treatment session. He reports 3-4/10 pain upon arrival that he states is usual for him.    Pertinent History  Patient is a 79 y.o. male who presents to outpatient physical therapy with a referral for medical diagnosis chonic R shoulder pain, nontraumatic complete tear of R rotator cuff. This patient's chief complaints consist of R shoulder pain, stiffness, weakness, surgical precautions s/p R RTC repair 09/02/2019 leading to the following functional deficits: inability to lift or use R UE for activities such as reaching, lifting, carrying, stabilizing, pushing, pulling, grooming, ADLs, IADLs, yardwork, farmwork, community and social activity. Relevant past medical history and comorbidities include hx of MI  (2000) with stent placement x2 (2017, 2000), skin cancer, carcinoma, melanoma (removed 6 years ago), pre-diabetes, HTN, arthritis, kidney stones, cataract implants, hx of adenomatous colonic polyps. Denies lung problem, seizures, stroke.    Diagnostic tests  Pre-Op MRI report 08/17/2019: "IMPRESSION:1. Supraspinatus tendinosis with full-thickness non-retracted tearof the anterior tendon fibers in the region of the critical zone.2. Subscapularis tendinosis with partial-thickness tearing.3. Intra-articular biceps tendinosis with partial tearing.4. Glenohumeral and acromioclavicular osteoarthritis."    Currently in Pain?  Yes    Pain Score  4        OBJECTIVE  FOTO = 59(11/01/2019)  R shoulder PROM - flexion: 150* - abduction: 114* - ER: 70* (at near 90 degrees abduction) *ERP  TREATMENT:  Manual therapy:to reduce pain and tissue tension, improve range of motion, neuromodulation, in order to promote improved ability to complete functional activities. - supine PROM as tolerated R shoulderflexion,abduction, ER,IRx20each direction. - gentle R shoulder distractionand shakingto decrease tension. - MFR to infraspinatus, pec major, deltoid  Therapeutic exercise:to centralize symptoms and improve ROM, strength, muscular endurance, and activity tolerance required for successful completion of functional activities. - supine AAROM flexion with stick, 5 second hold, x20 -supine shoulder extension isometric into towel 15x3secH -supine short arc flexion A/ROM 0-90 degrees 1x15  - supine isometric BUE extension into table 1x15x3secH, multimodal cues for paired scapular retraction, difficult -supine RUE ER belly to pain free range 1x15x 1secH   HOME EXERCISE PROGRAM Access Code: [email or web address] URL: [email or web address] Date: 10/13/2019 Prepared by: Rosita Kea  Exercises Standing Shoulder Shrug Circles AROM Backward - 2-3 x daily - 20 reps Standing Shoulder Shrug  Circles AROM Forward - 2-3 x daily - 20 reps Supine Shoulder Flexion Extension AAROM with [place] - 1 x daily - 20 reps - 5 seconds hold Standing Isometric Shoulder External Rotation with Doorway and Towel Roll - 2 x daily - 1 sets - 10 reps - 5 seconds hold Standing Isometric Shoulder Internal Rotation with Towel Roll at Doorway - 2 x daily - 1 sets - 10 reps - 5 seconds hold Isometric Shoulder Flexion at Wall - 2 x daily - 1 sets - 10 reps - 5 seconds hold Isometric Shoulder Extension at Wall - 2 x daily - 1 sets - 10 reps - 5 seconds hold Isometric Shoulder Abduction at Wall - 2 x daily - 1 sets - 10 reps - 5 seconds hold Row with band/cable - 1-2 x daily - 30 reps Supine Shoulder Circles - 1-2 x daily - 2-3 sets - 20 reps Supine Single Arm Shoulder Protraction - 1-2 x daily - 2 sets - 15 reps    PT Education - 11/01/19 1438    Education Details  Exercise purpose/form. Self management techniques.    Person(s) Educated  Patient    Methods  Explanation;Demonstration;Tactile cues;Verbal cues    Comprehension  Verbalized understanding;Returned demonstration;Verbal cues required;Tactile cues required;Need further instruction       PT Short Term Goals - 09/27/19 1951      PT SHORT TERM GOAL #1   Title  Be independent with initial home exercise program for self-management of symptoms.    Baseline  Initial HEP provided at IE (09/13/2019);    Time  2    Period  Weeks    Status  Achieved    Target Date  09/27/19        PT Long Term Goals - 10/18/19 1732      PT LONG TERM GOAL #1   Title  Be independent with a long-term home exercise program for self-management of symptoms.    Baseline  Initial HEP provided at IE (09/13/2019); currently participating in appropriate HEP (10/18/2019);    Time  12    Period  Weeks    Status  Partially Met    Target Date  12/06/19      PT LONG TERM GOAL #2   Title  Demonstrate improved FOTO score to equal or greater than 65 to demonstrate improvement in  overall condition and self-reported functional ability.    Baseline  FOTO = 43 (09/13/2019); FOTO = 54 10/18/2019);    Time  12    Period  Weeks    Status  New      PT LONG TERM GOAL #3   Title  Have R shoulder AROM equal or greater than L shoulder AROM with no compensations or increase in pain in all planes except intermittent end range discomfort to allow patient to complete valued activities with less difficulty.    Baseline  see objective exam, AROM deferred due to post-op precautions (09/13/2019); able to participate in gentle AROM - see objective (10/18/2019);    Time  12    Period  Weeks    Status  Partially Met    Target Date  12/06/19      PT LONG TERM GOAL #4   Title  Improve R shoulder strength to 4+/5 for improved ability to allow patient to complete valued functional tasks such as reaching overhead and yardwork with less difficulty.    Baseline  testing deferred due to post-op precautions (09/13/2019); now able to complete AROM in limited range 2/5 (10/18/2019);    Time  12    Period  Weeks    Status  Partially Met    Target Date  12/06/19      PT LONG TERM GOAL #5   Title  Complete community, work and/or recreational activities without limitation due to current condition.    Baseline  inability to lift or use R UE for activities such as reaching, lifting, carrying, stabilizing, pushing, pulling, grooming, ADLs, IADLs, yardwork, farmwork, community and social activity (09/13/2019); able to tie shows, improved gromming, ADLs, slight reaching and light carrying (10/18/2019);    Time  12    Period  Weeks    Status  Partially Met    Target Date  12/06/19            Plan - 11/01/19 1511    Clinical Impression Statement  Patient continues to tolerate treatment well. End range pain with PROM stretches, relieved with rest. Demonstrates continued improvement in FOTO score. Continues to tolerate progressive ROM and strengthening exercises well. Patient would benefit from continued  management of limiting condition by skilled physical therapist to address remaining impairments and functional limitations to work towards stated goals and return to [facility]  or maximal functional independence.    Personal Factors and Comorbidities  Age;Behavior Pattern;Comorbidity 3+;Education;Social Background;Time since onset of injury/illness/exacerbation;Past/Current Experience;Fitness    Comorbidities  hx of MI (2000) with stent placement x2 (2017, 2000), skin cancer, carcinoma, melanoma (removed 6 years ago), pre-diabetes, HTN, arthritis, kidney stones, cataract implants, hx of adenomatous colonic polyps. Denies lung problem, seizures, stroke    Examination-Activity Limitations  Bed Mobility;Bathing;Reach Overhead;Self Feeding;Carry;Dressing;Hygiene/Grooming;Sleep;Lift;Toileting    Examination-Participation Restrictions  Driving;Community Activity;Cleaning;Yard Work;Meal Prep;Other    Stability/Clinical Decision Making  Stable/Uncomplicated    Rehab Potential  Good    PT Frequency  2x / week    PT Duration  12 weeks    PT Treatment/Interventions  ADLs/Self Care Home Management;Aquatic Therapy;Electrical Stimulation;Cryotherapy;Moist Heat;DME Instruction;Therapeutic activities;Therapeutic exercise;Balance training;Neuromuscular re-education;Patient/family education;Manual techniques;Compression bandaging;Scar mobilization;Dry needling;Passive range of motion;Joint Manipulations;Spinal Manipulations    PT Next Visit Plan  Exercises for AAROM, low to moderate in tensity shoulder strengthening, stability, scapular strengthening    PT Home Exercise Plan  Medbridge Access Code: [email or web address]    Consulted and Agree with Plan of Care  Patient       Patient will benefit from skilled therapeutic intervention in order to improve the following deficits and impairments:  Decreased knowledge of use of DME, Decreased skin integrity, Increased fascial restricitons, Pain, Increased muscle spasms, Decreased scar  mobility, Decreased coordination, Decreased activity tolerance, Decreased endurance, Decreased range of motion, Decreased strength, Hypomobility, Impaired perceived functional ability, Impaired UE functional use, Impaired flexibility, Increased edema, Decreased safety awareness, Decreased knowledge of precautions  Visit Diagnosis: Chronic right shoulder pain  Stiffness of right shoulder, not elsewhere classified  Muscle weakness (generalized)     Problem List Patient Active Problem List   Diagnosis Date Noted  . Complete tear of right rotator cuff 09/07/2019  . Pain in right shoulder 07/27/2019  . Pre-diabetes 12/21/2015  . Abnormal stress test 12/20/2015  . Hypertension   . Coronary artery disease   . Hyperlipemia     Everlean Alstrom. Graylon Good, PT, DPT 11/01/19, 8:16 PM  [facility] [address] [place], [address] Phone: [phone]   Fax:  [phone]  Name: Kevin Stewart MRN: [id number] Date of Birth: [birth year]

## 2019-11-03 ENCOUNTER — Other Ambulatory Visit: Payer: Self-pay

## 2019-11-03 ENCOUNTER — Ambulatory Visit: Payer: Medicare Other | Admitting: Physical Therapy

## 2019-11-03 DIAGNOSIS — M25511 Pain in right shoulder: Secondary | ICD-10-CM | POA: Diagnosis not present

## 2019-11-03 DIAGNOSIS — M25611 Stiffness of right shoulder, not elsewhere classified: Secondary | ICD-10-CM

## 2019-11-03 DIAGNOSIS — G8929 Other chronic pain: Secondary | ICD-10-CM | POA: Diagnosis not present

## 2019-11-03 DIAGNOSIS — M6281 Muscle weakness (generalized): Secondary | ICD-10-CM

## 2019-11-03 NOTE — Therapy (Signed)
[facility] [address], [place], [address] Phone: [phone]   Fax:  [phone]  Physical Therapy Treatment  Patient Details  Name: Kevin Stewart MRN: [id number] Date of Birth: 11/24/1940 Referring Provider (PT): Garald Balding, MD   Encounter Date: 11/03/2019  PT End of Session - 11/03/19 1110    Visit Number  15    Number of Visits  24    Date for PT Re-Evaluation  12/06/19    Authorization Type  [employer] medicare reporting period from 10/20/2019    Authorization Time Period  N/A    Authorization - Visit Number  5    Authorization - Number of Visits  10    Progress Note Due on Visit  20   Next FOTO due = 4/26   PT Start Time  1030    PT Stop Time  1110    PT Time Calculation (min)  40 min    Activity Tolerance  Patient tolerated treatment well    Behavior During Therapy  [facility] for tasks assessed/performed       Past Medical History:  Diagnosis Date  . Arthritis    "hands" (12/20/2015)  . Basal cell carcinoma, face   . Coronary artery disease    a. 2003 - light MI per patient; s/p Cypher DES to prox LAD in [facility] [address]  Marland Kitchen Hx of adenomatous colonic polyps   . Hyperlipemia   . Hyperlipidemia   . Hypertension   . Kidney stones 1978   "passed them"  . Melanoma of right side of neck ([place])    removed posterior neck [id number]/312013  . Migraine    "stopped in 2000 when I had my heart attack"  . Myocardial infarction ([place]) 2000  . Pre-diabetes    checks cbg q month  . Rotator cuff tear, right   . Wears dentures    upper-lower partial  . Wears glasses     Past Surgical History:  Procedure Laterality Date  . CARDIAC CATHETERIZATION N/A 12/20/2015   Procedure: Left Heart Cath and Coronary Angiography;  Surgeon: Belva Crome, MD;  Location: [facility];  Service: Cardiovascular;  Laterality: N/A;  . CARDIAC CATHETERIZATION N/A 12/20/2015   Procedure: Coronary Stent Intervention;  Surgeon:  Belva Crome, MD;  Location: [place] [facility];  Service: Cardiovascular;  Laterality: N/A;  . CATARACT EXTRACTION W/ INTRAOCULAR LENS  IMPLANT, BILATERAL Bilateral 2011  . CIRCUMCISION  age 44  . COLONOSCOPY    . CORONARY ANGIOPLASTY WITH STENT PLACEMENT  2000   "1 stent"  . CORONARY ANGIOPLASTY WITH STENT PLACEMENT  12/20/2015   "1 stent"  . CYSTOSCOPY/URETEROSCOPY/HOLMIUM LASER/STENT PLACEMENT Right 08/03/2019   Procedure: CYSTOSCOPY/RETROGRADE/URETEROSCOPY//STENT PLACEMENT;  Surgeon: Ceasar Mons, MD;  Location: [facility];  Service: Urology;  Laterality: Right;  . HEMORRHOID SURGERY     "burned them off"  . MELANOMA EXCISION WITH SENTINEL LYMPH NODE BIOPSY  07/21/2012   Procedure: MELANOMA EXCISION WITH SENTINEL LYMPH NODE BIOPSY;  Surgeon: Rozetta Nunnery, MD;  Location: [place];  Service: General;  Laterality: N/A;  melanoma excision with sentinel node biopsy   posterior right neck  . TONSILLECTOMY  1961    There were no vitals filed for this visit.  Subjective Assessment - 11/03/19 1107    Subjective  Patient reports he is feeling well today with no pain upon arrival. States his shoulder felt better following last treatment session. Feels  he is doing well.    Pertinent History  Patient is a 79 y.o. male who presents to outpatient physical therapy with a referral for medical diagnosis chonic R shoulder pain, nontraumatic complete tear of R rotator cuff. This patient's chief complaints consist of R shoulder pain, stiffness, weakness, surgical precautions s/p R RTC repair 09/02/2019 leading to the following functional deficits: inability to lift or use R UE for activities such as reaching, lifting, carrying, stabilizing, pushing, pulling, grooming, ADLs, IADLs, yardwork, farmwork, community and social activity. Relevant past medical history and comorbidities include hx of MI (2000) with stent placement x2 (2017, 2000), skin cancer,  carcinoma, melanoma (removed 6 years ago), pre-diabetes, HTN, arthritis, kidney stones, cataract implants, hx of adenomatous colonic polyps. Denies lung problem, seizures, stroke.    Diagnostic tests  Pre-Op MRI report 08/17/2019: "IMPRESSION:1. Supraspinatus tendinosis with full-thickness non-retracted tearof the anterior tendon fibers in the region of the critical zone.2. Subscapularis tendinosis with partial-thickness tearing.3. Intra-articular biceps tendinosis with partial tearing.4. Glenohumeral and acromioclavicular osteoarthritis."    Currently in Pain?  No/denies       OBJECTIVE R RTC repair 09/02/2019 8 weeks, 6 days (11/03/2019)  FOTO = 59(11/01/2019)  R shoulder PROM (last measured 11/01/19) - flexion: 150* - abduction: 114* - ER: 70* (at near 90 degrees abduction) *ERP  TREATMENT:   Therapeutic exercise:to centralize symptoms and improve ROM, strength, muscular endurance, and activity tolerance required for successful completion of functional activities. - standing AAROM R shoulder wall slide into flexion, scaption, x 20 each. (mild end range discomfort, cuing to keep this minimal). - Standing rows with scapular retraction for improved postural and shoulder girdle strengthening and mobility. Required instruction for technique and cuing to retract, posteriorly tilt, and depress scapulae. x20 with yellow theraband, 3secH.  - standing dynamic R shoulder IR with towel roll under arm,  x20 with yellow theraband, 3secH. - Standing B shoulder extension with scapular retraction for improved postural and shoulder girdle strengthening and mobility. Required instruction for technique and cuing to retract, posteriorly tilt, and depress scapulae. x20 with yellow theraband, 3secH.  - standing R bicep curl, forearm supinated, 2x10 with 2# DB.  - sidelying R shoulder ER AROM with towel roll under arm, 2x10 (pain free range) - attempted sidelying R shoulder AROM circles at 90 degrees with  close guarding and AAROM to get to/from position but too uncomfortable so discontinued.  - supine R shoulder AROM circles at 90 degrees with AAROM to get to/from position., 2x20 each direction with progressively larger circles.   HOME EXERCISE PROGRAM Access Code: [email or web address] URL: [email or web address] Date: 10/13/2019 Prepared by: Rosita Kea  Exercises Standing Shoulder Shrug Circles AROM Backward - 2-3 x daily - 20 reps Standing Shoulder Shrug Circles AROM Forward - 2-3 x daily - 20 reps Supine Shoulder Flexion Extension AAROM with [place] - 1 x daily - 20 reps - 5 seconds hold Standing Isometric Shoulder External Rotation with Doorway and Towel Roll - 2 x daily - 1 sets - 10 reps - 5 seconds hold Standing Isometric Shoulder Internal Rotation with Towel Roll at Doorway - 2 x daily - 1 sets - 10 reps - 5 seconds hold Isometric Shoulder Flexion at Wall - 2 x daily - 1 sets - 10 reps - 5 seconds hold Isometric Shoulder Extension at Wall - 2 x daily - 1 sets - 10 reps - 5 seconds hold Isometric Shoulder Abduction at Wall - 2 x daily - 1 sets - 10 reps - 5 seconds hold  Row with band/cable - 1-2 x daily - 30 reps Supine Shoulder Circles - 1-2 x daily - 2-3 sets - 20 reps Supine Single Arm Shoulder Protraction - 1-2 x daily - 2 sets - 15 reps   PT Education - 11/03/19 1108    Education Details  Exercise purpose/form. Self management techniques.    Person(s) Educated  Patient    Methods  Explanation;Demonstration;Tactile cues;Verbal cues    Comprehension  Verbalized understanding;Returned demonstration;Verbal cues required;Tactile cues required;Need further instruction       PT Short Term Goals - 09/27/19 1951      PT SHORT TERM GOAL #1   Title  Be independent with initial home exercise program for self-management of symptoms.    Baseline  Initial HEP provided at IE (09/13/2019);    Time  2    Period  Weeks    Status  Achieved    Target Date  09/27/19        PT Long  Term Goals - 10/18/19 1732      PT LONG TERM GOAL #1   Title  Be independent with a long-term home exercise program for self-management of symptoms.    Baseline  Initial HEP provided at IE (09/13/2019); currently participating in appropriate HEP (10/18/2019);    Time  12    Period  Weeks    Status  Partially Met    Target Date  12/06/19      PT LONG TERM GOAL #2   Title  Demonstrate improved FOTO score to equal or greater than 65 to demonstrate improvement in overall condition and self-reported functional ability.    Baseline  FOTO = 43 (09/13/2019); FOTO = 54 10/18/2019);    Time  12    Period  Weeks    Status  New      PT LONG TERM GOAL #3   Title  Have R shoulder AROM equal or greater than L shoulder AROM with no compensations or increase in pain in all planes except intermittent end range discomfort to allow patient to complete valued activities with less difficulty.    Baseline  see objective exam, AROM deferred due to post-op precautions (09/13/2019); able to participate in gentle AROM - see objective (10/18/2019);    Time  12    Period  Weeks    Status  Partially Met    Target Date  12/06/19      PT LONG TERM GOAL #4   Title  Improve R shoulder strength to 4+/5 for improved ability to allow patient to complete valued functional tasks such as reaching overhead and yardwork with less difficulty.    Baseline  testing deferred due to post-op precautions (09/13/2019); now able to complete AROM in limited range 2/5 (10/18/2019);    Time  12    Period  Weeks    Status  Partially Met    Target Date  12/06/19      PT LONG TERM GOAL #5   Title  Complete community, work and/or recreational activities without limitation due to current condition.    Baseline  inability to lift or use R UE for activities such as reaching, lifting, carrying, stabilizing, pushing, pulling, grooming, ADLs, IADLs, yardwork, farmwork, community and social activity (09/13/2019); able to tie shows, improved gromming,  ADLs, slight reaching and light carrying (10/18/2019);    Time  12    Period  Weeks    Status  Partially Met    Target Date  12/06/19  Plan - 11/03/19 1115    Clinical Impression Statement  Patient tolerated treatment well with no lasting pain or significant discomfort. Returned to light strengthening, AAROM, and joint stabilization exercises. Patient tolerated well. Updated HEP to include supine shoulder circles as patient demonstrated excellent tolerance and good technique in clinic. Other progressions reserved for in clinic only at this point due to the potential to progress them too quickly without supervision. Patient would benefit from continued management of limiting condition by skilled physical therapist to address remaining impairments and functional limitations to work towards stated goals and return to PLOF or maximal functional independence.    Personal Factors and Comorbidities  Age;Behavior Pattern;Comorbidity 3+;Education;Social Background;Time since onset of injury/illness/exacerbation;Past/Current Experience;Fitness    Comorbidities  hx of MI (2000) with stent placement x2 (2017, 2000), skin cancer, carcinoma, melanoma (removed 6 years ago), pre-diabetes, HTN, arthritis, kidney stones, cataract implants, hx of adenomatous colonic polyps. Denies lung problem, seizures, stroke    Examination-Activity Limitations  Bed Mobility;Bathing;Reach Overhead;Self Feeding;Carry;Dressing;Hygiene/Grooming;Sleep;Lift;Toileting    Examination-Participation Restrictions  Driving;Community Activity;Cleaning;Yard Work;Meal Prep;Other    Stability/Clinical Decision Making  Stable/Uncomplicated    Rehab Potential  Good    PT Frequency  2x / week    PT Duration  12 weeks    PT Treatment/Interventions  ADLs/Self Care Home Management;Aquatic Therapy;Electrical Stimulation;Cryotherapy;Moist Heat;DME Instruction;Therapeutic activities;Therapeutic exercise;Balance training;Neuromuscular  re-education;Patient/family education;Manual techniques;Compression bandaging;Scar mobilization;Dry needling;Passive range of motion;Joint Manipulations;Spinal Manipulations    PT Next Visit Plan  Exercises for AAROM, low to moderate in tensity shoulder strengthening, stability, scapular strengthening    PT Home Exercise Plan  Medbridge Access Code: [email or web address]    Consulted and Agree with Plan of Care  Patient       Patient will benefit from skilled therapeutic intervention in order to improve the following deficits and impairments:  Decreased knowledge of use of DME, Decreased skin integrity, Increased fascial restricitons, Pain, Increased muscle spasms, Decreased scar mobility, Decreased coordination, Decreased activity tolerance, Decreased endurance, Decreased range of motion, Decreased strength, Hypomobility, Impaired perceived functional ability, Impaired UE functional use, Impaired flexibility, Increased edema, Decreased safety awareness, Decreased knowledge of precautions  Visit Diagnosis: Chronic right shoulder pain  Stiffness of right shoulder, not elsewhere classified  Muscle weakness (generalized)     Problem List Patient Active Problem List   Diagnosis Date Noted  . Complete tear of right rotator cuff 09/07/2019  . Pain in right shoulder 07/27/2019  . Pre-diabetes 12/21/2015  . Abnormal stress test 12/20/2015  . Hypertension   . Coronary artery disease   . Hyperlipemia     Everlean Alstrom. Graylon Good, PT, DPT 11/03/19, 11:16 AM  [facility] [address] [place], [address] Phone: [phone]   Fax:  [phone]  Name: Kevin Stewart MRN: [id number] Date of Birth: Jul 18, 1941

## 2019-11-08 ENCOUNTER — Encounter: Payer: Self-pay | Admitting: Physical Therapy

## 2019-11-08 ENCOUNTER — Other Ambulatory Visit: Payer: Self-pay

## 2019-11-08 ENCOUNTER — Ambulatory Visit: Payer: Medicare Other | Admitting: Physical Therapy

## 2019-11-08 DIAGNOSIS — M25511 Pain in right shoulder: Secondary | ICD-10-CM | POA: Diagnosis not present

## 2019-11-08 DIAGNOSIS — M25611 Stiffness of right shoulder, not elsewhere classified: Secondary | ICD-10-CM

## 2019-11-08 DIAGNOSIS — G8929 Other chronic pain: Secondary | ICD-10-CM

## 2019-11-08 DIAGNOSIS — M6281 Muscle weakness (generalized): Secondary | ICD-10-CM

## 2019-11-08 NOTE — Therapy (Signed)
[facility] [address], [place], [address] Phone: [phone]   Fax:  [phone]  Physical Therapy Treatment  Patient Details  Name: Kevin Stewart MRN: [id number] Date of Birth: Jun 22, 1941 Referring Provider (PT): Garald Balding, MD   Encounter Date: 11/08/2019  PT End of Session - 11/08/19 1120    Visit Number  16    Number of Visits  24    Date for PT Re-Evaluation  12/06/19    Authorization Type  [employer] medicare reporting period from 10/20/2019    Authorization Time Period  N/A    Authorization - Visit Number  6    Authorization - Number of Visits  10    Progress Note Due on Visit  20   Next FOTO due = 4/26   PT Start Time  1117    PT Stop Time  1158    PT Time Calculation (min)  41 min    Activity Tolerance  Patient tolerated treatment well    Behavior During Therapy  [facility] for tasks assessed/performed       Past Medical History:  Diagnosis Date  . Arthritis    "hands" (12/20/2015)  . Basal cell carcinoma, face   . Coronary artery disease    a. 2003 - light MI per patient; s/p Cypher DES to prox LAD in [facility].  Marland Kitchen Hx of adenomatous colonic polyps   . Hyperlipemia   . Hyperlipidemia   . Hypertension   . Kidney stones 1978   "passed them"  . Melanoma of right side of neck ([place])    removed posterior neck [id number]/312013  . Migraine    "stopped in 2000 when I had my heart attack"  . Myocardial infarction ([place]) 2000  . Pre-diabetes    checks cbg q month  . Rotator cuff tear, right   . Wears dentures    upper-lower partial  . Wears glasses     Past Surgical History:  Procedure Laterality Date  . CARDIAC CATHETERIZATION N/A 12/20/2015   Procedure: Left Heart Cath and Coronary Angiography;  Surgeon: Belva Crome, MD;  Location: [facility];  Service: Cardiovascular;  Laterality: N/A;  . CARDIAC CATHETERIZATION N/A 12/20/2015   Procedure: Coronary Stent Intervention;  Surgeon:  Belva Crome, MD;  Location: [facility];  Service: Cardiovascular;  Laterality: N/A;  . CATARACT EXTRACTION W/ INTRAOCULAR LENS  IMPLANT, BILATERAL Bilateral 2011  . CIRCUMCISION  age 23  . COLONOSCOPY    . CORONARY ANGIOPLASTY WITH STENT PLACEMENT  2000   "1 stent"  . CORONARY ANGIOPLASTY WITH STENT PLACEMENT  12/20/2015   "1 stent"  . CYSTOSCOPY/URETEROSCOPY/HOLMIUM LASER/STENT PLACEMENT Right 08/03/2019   Procedure: CYSTOSCOPY/RETROGRADE/URETEROSCOPY//STENT PLACEMENT;  Surgeon: Ceasar Mons, MD;  Location: [facility];  Service: Urology;  Laterality: Right;  . HEMORRHOID SURGERY     "burned them off"  . MELANOMA EXCISION WITH SENTINEL LYMPH NODE BIOPSY  07/21/2012   Procedure: MELANOMA EXCISION WITH SENTINEL LYMPH NODE BIOPSY;  Surgeon: Rozetta Nunnery, MD;  Location: [facility];  Service: General;  Laterality: N/A;  melanoma excision with sentinel node biopsy   posterior right neck  . TONSILLECTOMY  1961    There were no vitals filed for this visit.  Subjective Assessment - 11/08/19 1118    Subjective  Patient reports he is feeling well today and has a bit of soreness in the mornings but no pain. Had some mild  increase in soreness the morning following last treatment session but nothing that really bothered him.    Pertinent History  Patient is a 79 y.o. male who presents to outpatient physical therapy with a referral for medical diagnosis chonic R shoulder pain, nontraumatic complete tear of R rotator cuff. This patient's chief complaints consist of R shoulder pain, stiffness, weakness, surgical precautions s/p R RTC repair 09/02/2019 leading to the following functional deficits: inability to lift or use R UE for activities such as reaching, lifting, carrying, stabilizing, pushing, pulling, grooming, ADLs, IADLs, yardwork, farmwork, community and social activity. Relevant past medical history and comorbidities include hx of MI (2000)  with stent placement x2 (2017, 2000), skin cancer, carcinoma, melanoma (removed 6 years ago), pre-diabetes, HTN, arthritis, kidney stones, cataract implants, hx of adenomatous colonic polyps. Denies lung problem, seizures, stroke.    Diagnostic tests  Pre-Op MRI report 08/17/2019: "IMPRESSION:1. Supraspinatus tendinosis with full-thickness non-retracted tearof the anterior tendon fibers in the region of the critical zone.2. Subscapularis tendinosis with partial-thickness tearing.3. Intra-articular biceps tendinosis with partial tearing.4. Glenohumeral and acromioclavicular osteoarthritis."    Currently in Pain?  No/denies       OBJECTIVE R RTC repair 09/02/2019 9 weeks, 4 days (11/08/2019)  FOTO = 59(11/01/2019)  R shoulder PROM (last measured 11/01/19) - flexion: 150* - abduction: 114* - ER:70* (at near 90 degrees abduction) *ERP  TREATMENT:   Therapeutic exercise:to centralize symptoms and improve ROM, strength, muscular endurance, and activity tolerance required for successful completion of functional activities. - standing AAROM R shoulder wall slide into flexion, scaption, x 20 each. (mild end range discomfort, cuing to keep this minimal).  Circuit: - Standing rows with scapular retraction for improved postural and shoulder girdle strengthening and mobility. Required instruction for technique and cuing to retract, posteriorly tilt, and depress scapulae. 2x20 with yellow/red theraband, 3secH.  - standing dynamic R shoulder IR with towel roll under arm,  2x20 with yellow theraband, 3secH. - Standing B shoulder extension with scapular retraction for improved postural and shoulder girdle strengthening and mobility. Required instruction for technique and cuing to retract, posteriorly tilt, and depress scapulae. 2x20 with yellow theraband, 3secH. (red band too hard) - standing R bicep curl, forearm supinated, 2x10 with 2# DB.    - standing B scaption x 10 (limited by R pain free  arc).  - sidelying R shoulder ER AROM with towel roll under arm, 2x10, 3secH (pain free range) - attempted sidelying R shoulder AROM circles at 90 degrees with close guarding and AAROM to get to/from position but too uncomfortable so discontinued.  - supine R shoulder AROM flex/ext from 90 degrees in pain free rangewith AROM to get to/from position., x10 - supine R shoulder AROM circles at 90 degrees with AROM to get to/from position., x10 - Education on HEP including handout   HOME EXERCISE PROGRAM Access Code: [email or web address] URL: [email or web address] Date: 10/13/2019 Prepared by: Rosita Kea  Exercises Standing Shoulder Shrug Circles AROM Backward - 2-3 x daily - 20 reps Standing Shoulder Shrug Circles AROM Forward - 2-3 x daily - 20 reps Supine Shoulder Flexion Extension AAROM with [place] - 1 x daily - 20 reps - 5 seconds hold Standing Isometric Shoulder External Rotation with Doorway and Towel Roll - 2 x daily - 1 sets - 10 reps - 5 seconds hold Standing Isometric Shoulder Internal Rotation with Towel Roll at Doorway - 2 x daily - 1 sets - 10 reps - 5 seconds hold Isometric Shoulder Flexion at Wall - 2  x daily - 1 sets - 10 reps - 5 seconds hold Isometric Shoulder Extension at Wall - 2 x daily - 1 sets - 10 reps - 5 seconds hold Isometric Shoulder Abduction at Wall - 2 x daily - 1 sets - 10 reps - 5 seconds hold Row with band/cable - 1-2 x daily - 30 reps Supine Shoulder Circles - 1-2 x daily - 2-3 sets - 20 reps Supine Single Arm Shoulder Protraction - 1-2 x daily - 2 sets - 15 reps    PT Education - 11/08/19 1119    Education Details  Exercise purpose/form. Self management techniques.    Person(s) Educated  Patient    Methods  Explanation;Demonstration;Tactile cues;Verbal cues    Comprehension  Verbalized understanding;Returned demonstration;Verbal cues required;Tactile cues required;Need further instruction       PT Short Term Goals - 09/27/19 1951      PT SHORT  TERM GOAL #1   Title  Be independent with initial home exercise program for self-management of symptoms.    Baseline  Initial HEP provided at IE (09/13/2019);    Time  2    Period  Weeks    Status  Achieved    Target Date  09/27/19        PT Long Term Goals - 10/18/19 1732      PT LONG TERM GOAL #1   Title  Be independent with a long-term home exercise program for self-management of symptoms.    Baseline  Initial HEP provided at IE (09/13/2019); currently participating in appropriate HEP (10/18/2019);    Time  12    Period  Weeks    Status  Partially Met    Target Date  12/06/19      PT LONG TERM GOAL #2   Title  Demonstrate improved FOTO score to equal or greater than 65 to demonstrate improvement in overall condition and self-reported functional ability.    Baseline  FOTO = 43 (09/13/2019); FOTO = 54 10/18/2019);    Time  12    Period  Weeks    Status  New      PT LONG TERM GOAL #3   Title  Have R shoulder AROM equal or greater than L shoulder AROM with no compensations or increase in pain in all planes except intermittent end range discomfort to allow patient to complete valued activities with less difficulty.    Baseline  see objective exam, AROM deferred due to post-op precautions (09/13/2019); able to participate in gentle AROM - see objective (10/18/2019);    Time  12    Period  Weeks    Status  Partially Met    Target Date  12/06/19      PT LONG TERM GOAL #4   Title  Improve R shoulder strength to 4+/5 for improved ability to allow patient to complete valued functional tasks such as reaching overhead and yardwork with less difficulty.    Baseline  testing deferred due to post-op precautions (09/13/2019); now able to complete AROM in limited range 2/5 (10/18/2019);    Time  12    Period  Weeks    Status  Partially Met    Target Date  12/06/19      PT LONG TERM GOAL #5   Title  Complete community, work and/or recreational activities without limitation due to current  condition.    Baseline  inability to lift or use R UE for activities such as reaching, lifting, carrying, stabilizing, pushing, pulling, grooming, ADLs, IADLs,  yardwork, farmwork, community and social activity (09/13/2019); able to tie shows, improved gromming, ADLs, slight reaching and light carrying (10/18/2019);    Time  12    Period  Weeks    Status  Partially Met    Target Date  12/06/19            Plan - 11/08/19 1130    Clinical Impression Statement  Patient tolerated treatment well with no lasting increase in pain or discomfort. Continues to work on gentle R [place] and scapular strengthening and mobility. Patient would benefit from continued management of limiting condition by skilled physical therapist to address remaining impairments and functional limitations to work towards stated goals and return to PLOF or maximal functional independence.    Personal Factors and Comorbidities  Age;Behavior Pattern;Comorbidity 3+;Education;Social Background;Time since onset of injury/illness/exacerbation;Past/Current Experience;Fitness    Comorbidities  hx of MI (2000) with stent placement x2 (2017, 2000), skin cancer, carcinoma, melanoma (removed 6 years ago), pre-diabetes, HTN, arthritis, kidney stones, cataract implants, hx of adenomatous colonic polyps. Denies lung problem, seizures, stroke    Examination-Activity Limitations  Bed Mobility;Bathing;Reach Overhead;Self Feeding;Carry;Dressing;Hygiene/Grooming;Sleep;Lift;Toileting    Examination-Participation Restrictions  Driving;Community Activity;Cleaning;Yard Work;Meal Prep;Other    Stability/Clinical Decision Making  Stable/Uncomplicated    Rehab Potential  Good    PT Frequency  2x / week    PT Duration  12 weeks    PT Treatment/Interventions  ADLs/Self Care Home Management;Aquatic Therapy;Electrical Stimulation;Cryotherapy;Moist Heat;DME Instruction;Therapeutic activities;Therapeutic exercise;Balance training;Neuromuscular  re-education;Patient/family education;Manual techniques;Compression bandaging;Scar mobilization;Dry needling;Passive range of motion;Joint Manipulations;Spinal Manipulations    PT Next Visit Plan  Exercises for AAROM, low to moderate in tensity shoulder strengthening, stability, scapular strengthening    PT Home Exercise Plan  Medbridge Access Code: [email or web address]    Consulted and Agree with Plan of Care  Patient       Patient will benefit from skilled therapeutic intervention in order to improve the following deficits and impairments:  Decreased knowledge of use of DME, Decreased skin integrity, Increased fascial restricitons, Pain, Increased muscle spasms, Decreased scar mobility, Decreased coordination, Decreased activity tolerance, Decreased endurance, Decreased range of motion, Decreased strength, Hypomobility, Impaired perceived functional ability, Impaired UE functional use, Impaired flexibility, Increased edema, Decreased safety awareness, Decreased knowledge of precautions  Visit Diagnosis: Chronic right shoulder pain  Stiffness of right shoulder, not elsewhere classified  Muscle weakness (generalized)     Problem List Patient Active Problem List   Diagnosis Date Noted  . Complete tear of right rotator cuff 09/07/2019  . Pain in right shoulder 07/27/2019  . Pre-diabetes 12/21/2015  . Abnormal stress test 12/20/2015  . Hypertension   . Coronary artery disease   . Hyperlipemia     Everlean Alstrom. Graylon Good, PT, DPT 11/08/19, 12:12 PM  [facility] [address] [place], [address] Phone: [phone]   Fax:  [phone]  Name: Kevin Stewart MRN: [id number] Date of Birth: [birth year]

## 2019-11-10 ENCOUNTER — Other Ambulatory Visit: Payer: Self-pay

## 2019-11-10 ENCOUNTER — Ambulatory Visit: Payer: Medicare Other | Admitting: Physical Therapy

## 2019-11-10 ENCOUNTER — Encounter: Payer: Self-pay | Admitting: Physical Therapy

## 2019-11-10 DIAGNOSIS — G8929 Other chronic pain: Secondary | ICD-10-CM | POA: Diagnosis not present

## 2019-11-10 DIAGNOSIS — M25611 Stiffness of right shoulder, not elsewhere classified: Secondary | ICD-10-CM

## 2019-11-10 DIAGNOSIS — M25511 Pain in right shoulder: Secondary | ICD-10-CM | POA: Diagnosis not present

## 2019-11-10 DIAGNOSIS — M6281 Muscle weakness (generalized): Secondary | ICD-10-CM

## 2019-11-10 NOTE — Therapy (Signed)
[facility] [address] [place], [address] Phone: [phone]   Fax:  [phone]  Physical Therapy Treatment  Patient Details  Name: Kevin Stewart MRN: [id number] Date of Birth: 1940-09-26 Referring Provider (PT): Garald Balding, MD   Encounter Date: 11/10/2019  PT End of Session - 11/10/19 1049    Visit Number  17    Number of Visits  24    Date for PT Re-Evaluation  12/06/19    Authorization Type  [employer] medicare reporting period from 10/20/2019    Authorization Time Period  N/A    Authorization - Visit Number  7    Authorization - Number of Visits  10    Progress Note Due on Visit  20   Next FOTO due = 4/26   PT Start Time  1030    PT Stop Time  1110    PT Time Calculation (min)  40 min    Activity Tolerance  Patient tolerated treatment well;No increased pain    Behavior During Therapy  WFL for tasks assessed/performed       Past Medical History:  Diagnosis Date  . Arthritis    "hands" (12/20/2015)  . Basal cell carcinoma, face   . Coronary artery disease    a. 2003 - light MI per patient; s/p Cypher DES to prox LAD in [facility].  Marland Kitchen Hx of adenomatous colonic polyps   . Hyperlipemia   . Hyperlipidemia   . Hypertension   . Kidney stones 1978   "passed them"  . Melanoma of right side of neck ([place])    removed posterior neck 12/312013  . Migraine    "stopped in 2000 when I had my heart attack"  . Myocardial infarction ([place]) 2000  . Pre-diabetes    checks cbg q month  . Rotator cuff tear, right   . Wears dentures    upper-lower partial  . Wears glasses     Past Surgical History:  Procedure Laterality Date  . CARDIAC CATHETERIZATION N/A 12/20/2015   Procedure: Left Heart Cath and Coronary Angiography;  Surgeon: Belva Crome, MD;  Location: [facility];  Service: Cardiovascular;  Laterality: N/A;  . CARDIAC CATHETERIZATION N/A 12/20/2015   Procedure: Coronary Stent  Intervention;  Surgeon: Belva Crome, MD;  Location: [facility];  Service: Cardiovascular;  Laterality: N/A;  . CATARACT EXTRACTION W/ INTRAOCULAR LENS  IMPLANT, BILATERAL Bilateral 2011  . CIRCUMCISION  age 30  . COLONOSCOPY    . CORONARY ANGIOPLASTY WITH STENT PLACEMENT  2000   "1 stent"  . CORONARY ANGIOPLASTY WITH STENT PLACEMENT  12/20/2015   "1 stent"  . CYSTOSCOPY/URETEROSCOPY/HOLMIUM LASER/STENT PLACEMENT Right 08/03/2019   Procedure: CYSTOSCOPY/RETROGRADE/URETEROSCOPY//STENT PLACEMENT;  Surgeon: Ceasar Mons, MD;  Location: [facility];  Service: Urology;  Laterality: Right;  . HEMORRHOID SURGERY     "burned them off"  . MELANOMA EXCISION WITH SENTINEL LYMPH NODE BIOPSY  07/21/2012   Procedure: MELANOMA EXCISION WITH SENTINEL LYMPH NODE BIOPSY;  Surgeon: Rozetta Nunnery, MD;  Location: [facility];  Service: General;  Laterality: N/A;  melanoma excision with sentinel node biopsy   posterior right neck  . TONSILLECTOMY  1961    There were no vitals filed for this visit.  Subjective Assessment - 11/10/19 1101    Subjective  Patient reports he was a little sore yesterday but is feeling good today. He has less than 1/10 pain in  the R shoulder. Has been doing his updated HEP without difficulty. He thought he may have pushed a bit too far into discomfort but feels he has figured out how to stay in an appropriate pain free range now.    Pertinent History  Patient is a 79 y.o. male who presents to outpatient physical therapy with a referral for medical diagnosis chonic R shoulder pain, nontraumatic complete tear of R rotator cuff. This patient's chief complaints consist of R shoulder pain, stiffness, weakness, surgical precautions s/p R RTC repair 09/02/2019 leading to the following functional deficits: inability to lift or use R UE for activities such as reaching, lifting, carrying, stabilizing, pushing, pulling, grooming, ADLs, IADLs,  yardwork, farmwork, community and social activity. Relevant past medical history and comorbidities include hx of MI (2000) with stent placement x2 (2017, 2000), skin cancer, carcinoma, melanoma (removed 6 years ago), pre-diabetes, HTN, arthritis, kidney stones, cataract implants, hx of adenomatous colonic polyps. Denies lung problem, seizures, stroke.    Diagnostic tests  Pre-Op MRI report 08/17/2019: "IMPRESSION:1. Supraspinatus tendinosis with full-thickness non-retracted tearof the anterior tendon fibers in the region of the critical zone.2. Subscapularis tendinosis with partial-thickness tearing.3. Intra-articular biceps tendinosis with partial tearing.4. Glenohumeral and acromioclavicular osteoarthritis."    Currently in Pain?  Yes    Pain Score  1     Pain Location  Shoulder    Pain Orientation  Right       OBJECTIVE R RTC repair 09/02/2019 9 weeks, 6 days (11/08/2019)  FOTO = 59(11/01/2019)  R shoulder PROM(last measured 11/01/19) - flexion: 150* - abduction: 114* - ER:70* (at near 90 degrees abduction) *ERP   R shoulder AAROM  - flexion 133 - scaption 126  TREATMENT:   Therapeutic exercise:to centralize symptoms and improve ROM, strength, muscular endurance, and activity tolerance required for successful completion of functional activities.  - standing AAROM R shoulder wall slide into flexion, scaption, x 20 each. (mild end range discomfort, cuing to keep this minimal).  Circuit: -Standing rows with scapular retraction for improved postural and shoulder girdle strengthening and mobility. Required instruction for technique and cuing to retract, posteriorly tilt, and depress scapulae.2x20 with yellow/red theraband, 3secH. - standing dynamic R shoulder IR with towel roll under arm,2x20 with yellow theraband, 3secH. - standing dynamic R shoulder IR with towel roll under arm,2x20 with yellow theraband, 3secH. -StandingB shoulder extensionwith scapular  retraction for improved postural and shoulder girdle strengthening and mobility. Required instruction for technique and cuing to retract, posteriorly tilt, and depress scapulae. 2x20 with yellow thera-band, 3secH.  - standing R bicep curl, forearm supinated, 2x20 with 3# DB. - Education on HEP including handout    HOME EXERCISE PROGRAM AAccess Code: [email or web address] URL: [email or web address] Date: 11/10/2019 Prepared by: Rosita Kea  Exercises Supine Shoulder Flexion Extension AAROM with Dowel - 1 x daily - 20 reps - 5 seconds hold Supine Shoulder Circles - 1-2 x daily - 2-3 sets - 20 reps Shoulder Flexion Wall Slide with Towel - 1 x daily - 1 sets - 20 reps - 5 seconds hold Row with band/cable - 1-2 x daily - 30 reps Shoulder Internal Rotation with Resistance - 1 x daily - 1 sets - 20 reps - 3 seconds hold Shoulder External Rotation with Anchored Resistance - 1 x daily - 1 sets - 20 reps - 3 seconds hold Standing Single Arm Bicep Curls with Rotation and Dumbbell - 1 x daily - 2-3 sets - 10 reps Isometric Shoulder Abduction at Wall - 2 x daily -  1 sets - 10 reps - 5 seconds hold   PT Education - 11/10/19 1103    Education Details  Exercise purpose/form. Self management techniques.    Person(s) Educated  Patient    Methods  Explanation;Demonstration;Tactile cues;Verbal cues    Comprehension  Verbalized understanding;Returned demonstration;Verbal cues required;Tactile cues required;Need further instruction       PT Short Term Goals - 09/27/19 1951      PT SHORT TERM GOAL #1   Title  Be independent with initial home exercise program for self-management of symptoms.    Baseline  Initial HEP provided at IE (09/13/2019);    Time  2    Period  Weeks    Status  Achieved    Target Date  09/27/19        PT Long Term Goals - 10/18/19 1732      PT LONG TERM GOAL #1   Title  Be independent with a long-term home exercise program for self-management of symptoms.    Baseline   Initial HEP provided at IE (09/13/2019); currently participating in appropriate HEP (10/18/2019);    Time  12    Period  Weeks    Status  Partially Met    Target Date  12/06/19      PT LONG TERM GOAL #2   Title  Demonstrate improved FOTO score to equal or greater than 65 to demonstrate improvement in overall condition and self-reported functional ability.    Baseline  FOTO = 43 (09/13/2019); FOTO = 54 10/18/2019);    Time  12    Period  Weeks    Status  New      PT LONG TERM GOAL #3   Title  Have R shoulder AROM equal or greater than L shoulder AROM with no compensations or increase in pain in all planes except intermittent end range discomfort to allow patient to complete valued activities with less difficulty.    Baseline  see objective exam, AROM deferred due to post-op precautions (09/13/2019); able to participate in gentle AROM - see objective (10/18/2019);    Time  12    Period  Weeks    Status  Partially Met    Target Date  12/06/19      PT LONG TERM GOAL #4   Title  Improve R shoulder strength to 4+/5 for improved ability to allow patient to complete valued functional tasks such as reaching overhead and yardwork with less difficulty.    Baseline  testing deferred due to post-op precautions (09/13/2019); now able to complete AROM in limited range 2/5 (10/18/2019);    Time  12    Period  Weeks    Status  Partially Met    Target Date  12/06/19      PT LONG TERM GOAL #5   Title  Complete community, work and/or recreational activities without limitation due to current condition.    Baseline  inability to lift or use R UE for activities such as reaching, lifting, carrying, stabilizing, pushing, pulling, grooming, ADLs, IADLs, yardwork, farmwork, community and social activity (09/13/2019); able to tie shows, improved gromming, ADLs, slight reaching and light carrying (10/18/2019);    Time  12    Period  Weeks    Status  Partially Met    Target Date  12/06/19            Plan -  11/10/19 1212    Clinical Impression Statement  Patient continues to make good progress and is showing improvements in activity  tolerance and ability to self-correct posture and complete exercises with good technique. He is also demonstrating improved awareness on how to maintain pain free range of motion and how to self-adjust when needed to maintain appropriate form, comfort, load. Pt required multimodal cuing for proper technique and to facilitate improved neuromuscular control, strength, range of motion, and functional ability resulting in improved performance and form. Patient would benefit from continued management of limiting condition by skilled physical therapist to address remaining impairments and functional limitations to work towards stated goals and return to PLOF or maximal functional independence.    Personal Factors and Comorbidities  Age;Behavior Pattern;Comorbidity 3+;Education;Social Background;Time since onset of injury/illness/exacerbation;Past/Current Experience;Fitness    Comorbidities  hx of MI (2000) with stent placement x2 (2017, 2000), skin cancer, carcinoma, melanoma (removed 6 years ago), pre-diabetes, HTN, arthritis, kidney stones, cataract implants, hx of adenomatous colonic polyps. Denies lung problem, seizures, stroke    Examination-Activity Limitations  Bed Mobility;Bathing;Reach Overhead;Self Feeding;Carry;Dressing;Hygiene/Grooming;Sleep;Lift;Toileting    Examination-Participation Restrictions  Driving;Community Activity;Cleaning;Yard Work;Meal Prep;Other    Stability/Clinical Decision Making  Stable/Uncomplicated    Rehab Potential  Good    PT Frequency  2x / week    PT Duration  12 weeks    PT Treatment/Interventions  ADLs/Self Care Home Management;Aquatic Therapy;Electrical Stimulation;Cryotherapy;Moist Heat;DME Instruction;Therapeutic activities;Therapeutic exercise;Balance training;Neuromuscular re-education;Patient/family education;Manual techniques;Compression  bandaging;Scar mobilization;Dry needling;Passive range of motion;Joint Manipulations;Spinal Manipulations    PT Next Visit Plan  Exercises for AAROM, low to moderate in tensity shoulder strengthening, stability, scapular strengthening    PT Home Exercise Plan  Medbridge Access Code: [email or web address]    Consulted and Agree with Plan of Care  Patient       Patient will benefit from skilled therapeutic intervention in order to improve the following deficits and impairments:  Decreased knowledge of use of DME, Decreased skin integrity, Increased fascial restricitons, Pain, Increased muscle spasms, Decreased scar mobility, Decreased coordination, Decreased activity tolerance, Decreased endurance, Decreased range of motion, Decreased strength, Hypomobility, Impaired perceived functional ability, Impaired UE functional use, Impaired flexibility, Increased edema, Decreased safety awareness, Decreased knowledge of precautions  Visit Diagnosis: Chronic right shoulder pain  Stiffness of right shoulder, not elsewhere classified  Muscle weakness (generalized)     Problem List Patient Active Problem List   Diagnosis Date Noted  . Complete tear of right rotator cuff 09/07/2019  . Pain in right shoulder 07/27/2019  . Pre-diabetes 12/21/2015  . Abnormal stress test 12/20/2015  . Hypertension   . Coronary artery disease   . Hyperlipemia     Everlean Alstrom. Graylon Good, PT, DPT 11/10/19, 12:13 PM  [facility] [address] [place], [address] Phone: [phone]   Fax:  [phone]  Name: Kevin Stewart MRN: [id number] Date of Birth: 07-Nov-1940

## 2019-11-12 DIAGNOSIS — H02055 Trichiasis without entropian left lower eyelid: Secondary | ICD-10-CM | POA: Diagnosis not present

## 2019-11-15 ENCOUNTER — Ambulatory Visit: Payer: Medicare Other | Admitting: Physical Therapy

## 2019-11-15 ENCOUNTER — Other Ambulatory Visit: Payer: Self-pay

## 2019-11-15 ENCOUNTER — Encounter: Payer: Self-pay | Admitting: Physical Therapy

## 2019-11-15 DIAGNOSIS — E785 Hyperlipidemia, unspecified: Secondary | ICD-10-CM | POA: Diagnosis not present

## 2019-11-15 DIAGNOSIS — G8929 Other chronic pain: Secondary | ICD-10-CM | POA: Diagnosis not present

## 2019-11-15 DIAGNOSIS — M6281 Muscle weakness (generalized): Secondary | ICD-10-CM

## 2019-11-15 DIAGNOSIS — M25511 Pain in right shoulder: Secondary | ICD-10-CM | POA: Diagnosis not present

## 2019-11-15 DIAGNOSIS — M25611 Stiffness of right shoulder, not elsewhere classified: Secondary | ICD-10-CM

## 2019-11-15 DIAGNOSIS — Z9181 History of falling: Secondary | ICD-10-CM | POA: Diagnosis not present

## 2019-11-15 DIAGNOSIS — Z0000 Encounter for general adult medical examination without abnormal findings: Secondary | ICD-10-CM | POA: Diagnosis not present

## 2019-11-15 NOTE — Therapy (Signed)
[facility] [address] [place], [address] Phone: [phone]   Fax:  [phone]  Physical Therapy Treatment  Patient Details  Name: Kevin Stewart MRN: [id number] Date of Birth: 06/05/1941 Referring Provider (PT): Garald Balding, MD   Encounter Date: 11/15/2019  PT End of Session - 11/15/19 1423    Visit Number  18    Number of Visits  24    Date for PT Re-Evaluation  12/06/19    Authorization Type  [employer] medicare reporting period from 10/20/2019    Authorization Time Period  N/A    Authorization - Visit Number  8    Authorization - Number of Visits  10    Progress Note Due on Visit  20   Next FOTO due = 4/26   PT Start Time  1115    PT Stop Time  1200    PT Time Calculation (min)  45 min    Activity Tolerance  Patient tolerated treatment well;No increased pain    Behavior During Therapy  WFL for tasks assessed/performed       Past Medical History:  Diagnosis Date  . Arthritis    "hands" (12/20/2015)  . Basal cell carcinoma, face   . Coronary artery disease    a. 2003 - light MI per patient; s/p Cypher DES to prox LAD in [facility].  Marland Kitchen Hx of adenomatous colonic polyps   . Hyperlipemia   . Hyperlipidemia   . Hypertension   . Kidney stones 1978   "passed them"  . Melanoma of right side of neck ([place])    removed posterior neck [id number]/312013  . Migraine    "stopped in 2000 when I had my heart attack"  . Myocardial infarction ([place]) 2000  . Pre-diabetes    checks cbg q month  . Rotator cuff tear, right   . Wears dentures    upper-lower partial  . Wears glasses     Past Surgical History:  Procedure Laterality Date  . CARDIAC CATHETERIZATION N/A 12/20/2015   Procedure: Left Heart Cath and Coronary Angiography;  Surgeon: Belva Crome, MD;  Location: [facility];  Service: Cardiovascular;  Laterality: N/A;  . CARDIAC CATHETERIZATION N/A 12/20/2015   Procedure: Coronary Stent  Intervention;  Surgeon: Belva Crome, MD;  Location: [facility];  Service: Cardiovascular;  Laterality: N/A;  . CATARACT EXTRACTION W/ INTRAOCULAR LENS  IMPLANT, BILATERAL Bilateral 2011  . CIRCUMCISION  age 65  . COLONOSCOPY    . CORONARY ANGIOPLASTY WITH STENT PLACEMENT  2000   "1 stent"  . CORONARY ANGIOPLASTY WITH STENT PLACEMENT  12/20/2015   "1 stent"  . CYSTOSCOPY/URETEROSCOPY/HOLMIUM LASER/STENT PLACEMENT Right 08/03/2019   Procedure: CYSTOSCOPY/RETROGRADE/URETEROSCOPY//STENT PLACEMENT;  Surgeon: Ceasar Mons, MD;  Location[facility];  Service: Urology;  Laterality: Right;  . HEMORRHOID SURGERY     "burned them off"  . MELANOMA EXCISION WITH SENTINEL LYMPH NODE BIOPSY  07/21/2012   Procedure: MELANOMA EXCISION WITH SENTINEL LYMPH NODE BIOPSY;  Surgeon: Rozetta Nunnery, MD;  Location: [facility];  Service: General;  Laterality: N/A;  melanoma excision with sentinel node biopsy   posterior right neck  . TONSILLECTOMY  1961    There were no vitals filed for this visit.  Subjective Assessment - 11/15/19 1421    Subjective  Patient reports he continues to have a little pain every morning and wants to know if this is normal. States  he cannot do wall slides first in the morning but can use the stick in supine flexion or the wall after his other exercises. States he has been doing his currently assigned exercises as well as those previously prescribed. States he has no pain as the day goes on and is careful not to push into pain during exercise. Felt fine following last treatment session. Wants to make sure he is doing his HEP correctly.    Pertinent History  Patient is a 79 y.o. male who presents to outpatient physical therapy with a referral for medical diagnosis chonic R shoulder pain, nontraumatic complete tear of R rotator cuff. This patient's chief complaints consist of R shoulder pain, stiffness, weakness, surgical precautions s/p  R RTC repair 09/02/2019 leading to the following functional deficits: inability to lift or use R UE for activities such as reaching, lifting, carrying, stabilizing, pushing, pulling, grooming, ADLs, IADLs, yardwork, farmwork, community and social activity. Relevant past medical history and comorbidities include hx of MI (2000) with stent placement x2 (2017, 2000), skin cancer, carcinoma, melanoma (removed 6 years ago), pre-diabetes, HTN, arthritis, kidney stones, cataract implants, hx of adenomatous colonic polyps. Denies lung problem, seizures, stroke.    Diagnostic tests  Pre-Op MRI report 08/17/2019: "IMPRESSION:1. Supraspinatus tendinosis with full-thickness non-retracted tearof the anterior tendon fibers in the region of the critical zone.2. Subscapularis tendinosis with partial-thickness tearing.3. Intra-articular biceps tendinosis with partial tearing.4. Glenohumeral and acromioclavicular osteoarthritis."    Currently in Pain?  No/denies       OBJECTIVE R RTC repair 09/02/2019 10weeks, 4 days (11/15/2019)  FOTO = 59(11/01/2019)  R shoulder PROM(last measured 11/15/19) - flexion: 155* *ERP   R shoulder AAROM (on wall) - flexion 133 - scaption 126  TREATMENT:   Therapeutic exercise:to centralize symptoms and improve ROM, strength, muscular endurance, and activity tolerance required for successful completion of functional activities.  - standing AAROM R shoulder wall slide into flexion, scaption, x 20 each. (mild end range discomfort, cuing to keep this minimal).  Manual therapy: to reduce pain and tissue tension, improve range of motion, neuromodulation, in order to promote improved ability to complete functional activities. - PROM R shoulder flexion, abduction/scaption (with slight GHJ caudle glide with movement), IR, ER x 10 each with 5 second hold at end range with gentle OP as tolerated - R GHJ mobilizations grade III-IV, caudal, distraction, AP, 1-3 x 30 seconds each  direction.  - STM to anterior and posterior R shoulder for improved comfort and decreased tension.   Review/update of HEP:  - standing dynamic R shoulder IR with towel roll under arm,1x20 with yellow theraband, 3secH. - standing dynamic R shoulder ER with towel roll under arm,1x20 with yellow theraband, 3secH. Increased ROM -Standing R single rows with scapular retraction for improved postural and shoulder girdle strengthening and mobility. Required instruction for technique and cuing to retract, posteriorly tilt, and depress scapulae.1x20 with doubletheraband, 3secH. - standing R bicep curl, forearm supinated, 1x20 with 3# DB. - standing AROM B scaption to 90 degrees (good mechanics) x 20. Updated in HEP.  - Education on HEP including handout   HOME EXERCISE PROGRAM Access Code: [email or web address] URL: [email or web address] Date: 11/15/2019 Prepared by: Rosita Kea  Exercises Supine Shoulder Flexion Extension AAROM with Dowel - 1 x daily - 20 reps - 5 seconds hold Supine Shoulder Circles - 1-2 x daily - 2-3 sets - 20 reps Shoulder Flexion Wall Slide with Towel - 1 x daily - 1 sets - 20 reps - 5 seconds hold  Row with band/cable - 1-2 x daily - 30 reps Shoulder Internal Rotation with Resistance - 1 x daily - 1 sets - 20 reps - 3 seconds hold Shoulder External Rotation with Anchored Resistance - 1 x daily - 1 sets - 20 reps - 3 seconds hold Standing Single Arm Bicep Curls with Rotation and Dumbbell - 1 x daily - 2-3 sets - 10 reps Standing Shoulder Scaption - 1 x daily - 2-3 sets - 10 reps Supine Shoulder Flexion Extension Full Range AROM Sidelying Shoulder Abduction Palm Forward   PT Education - 11/15/19 1423    Education Details  Exercise purpose/form. Self management techniques. HEP    Person(s) Educated  Patient    Methods  Explanation;Demonstration;Tactile cues;Verbal cues;Handout    Comprehension  Verbalized understanding;Returned demonstration;Verbal cues  required;Tactile cues required;Need further instruction;Other (comment)       PT Short Term Goals - 09/27/19 1951      PT SHORT TERM GOAL #1   Title  Be independent with initial home exercise program for self-management of symptoms.    Baseline  Initial HEP provided at IE (09/13/2019);    Time  2    Period  Weeks    Status  Achieved    Target Date  09/27/19        PT Long Term Goals - 10/18/19 1732      PT LONG TERM GOAL #1   Title  Be independent with a long-term home exercise program for self-management of symptoms.    Baseline  Initial HEP provided at IE (09/13/2019); currently participating in appropriate HEP (10/18/2019);    Time  12    Period  Weeks    Status  Partially Met    Target Date  12/06/19      PT LONG TERM GOAL #2   Title  Demonstrate improved FOTO score to equal or greater than 65 to demonstrate improvement in overall condition and self-reported functional ability.    Baseline  FOTO = 43 (09/13/2019); FOTO = 54 10/18/2019);    Time  12    Period  Weeks    Status  New      PT LONG TERM GOAL #3   Title  Have R shoulder AROM equal or greater than L shoulder AROM with no compensations or increase in pain in all planes except intermittent end range discomfort to allow patient to complete valued activities with less difficulty.    Baseline  see objective exam, AROM deferred due to post-op precautions (09/13/2019); able to participate in gentle AROM - see objective (10/18/2019);    Time  12    Period  Weeks    Status  Partially Met    Target Date  12/06/19      PT LONG TERM GOAL #4   Title  Improve R shoulder strength to 4+/5 for improved ability to allow patient to complete valued functional tasks such as reaching overhead and yardwork with less difficulty.    Baseline  testing deferred due to post-op precautions (09/13/2019); now able to complete AROM in limited range 2/5 (10/18/2019);    Time  12    Period  Weeks    Status  Partially Met    Target Date  12/06/19       PT LONG TERM GOAL #5   Title  Complete community, work and/or recreational activities without limitation due to current condition.    Baseline  inability to lift or use R UE for activities such as reaching, lifting, carrying,  stabilizing, pushing, pulling, grooming, ADLs, IADLs, yardwork, farmwork, community and social activity (09/13/2019); able to tie shows, improved gromming, ADLs, slight reaching and light carrying (10/18/2019);    Time  12    Period  Weeks    Status  Partially Met    Target Date  12/06/19            Plan - 11/15/19 1428    Clinical Impression Statement  Patient tolerated treatment well and is showing good carry over and execution of HEP. Would benefit from continued intermittant technique checks for most exercises with continued supervised R shoulder ER due to sub-optimal form (but acceptable) without cuing. Continues to require re-assurance for progress and cuing to perform wall slide closer to abduction. Would benefit from further manual therapy to gradually improve R shoulder available ROM. Patient would benefit from continued management of limiting condition by skilled physical therapist to address remaining impairments and functional limitations to work towards stated goals and return to PLOF or maximal functional independence.    Personal Factors and Comorbidities  Age;Behavior Pattern;Comorbidity 3+;Education;Social Background;Time since onset of injury/illness/exacerbation;Past/Current Experience;Fitness    Comorbidities  hx of MI (2000) with stent placement x2 (2017, 2000), skin cancer, carcinoma, melanoma (removed 6 years ago), pre-diabetes, HTN, arthritis, kidney stones, cataract implants, hx of adenomatous colonic polyps. Denies lung problem, seizures, stroke    Examination-Activity Limitations  Bed Mobility;Bathing;Reach Overhead;Self Feeding;Carry;Dressing;Hygiene/Grooming;Sleep;Lift;Toileting    Examination-Participation Restrictions  Driving;Community  Activity;Cleaning;Yard Work;Meal Prep;Other    Stability/Clinical Decision Making  Stable/Uncomplicated    Rehab Potential  Good    PT Frequency  2x / week    PT Duration  12 weeks    PT Treatment/Interventions  ADLs/Self Care Home Management;Aquatic Therapy;Electrical Stimulation;Cryotherapy;Moist Heat;DME Instruction;Therapeutic activities;Therapeutic exercise;Balance training;Neuromuscular re-education;Patient/family education;Manual techniques;Compression bandaging;Scar mobilization;Dry needling;Passive range of motion;Joint Manipulations;Spinal Manipulations    PT Next Visit Plan  Exercises for AROM, low to moderate in tensity shoulder strengthening, stability, scapular strengthening    PT Home Exercise Plan  Medbridge Access Code: [email or web address]    Consulted and Agree with Plan of Care  Patient       Patient will benefit from skilled therapeutic intervention in order to improve the following deficits and impairments:  Decreased knowledge of use of DME, Decreased skin integrity, Increased fascial restricitons, Pain, Increased muscle spasms, Decreased scar mobility, Decreased coordination, Decreased activity tolerance, Decreased endurance, Decreased range of motion, Decreased strength, Hypomobility, Impaired perceived functional ability, Impaired UE functional use, Impaired flexibility, Increased edema, Decreased safety awareness, Decreased knowledge of precautions  Visit Diagnosis: Chronic right shoulder pain  Stiffness of right shoulder, not elsewhere classified  Muscle weakness (generalized)     Problem List Patient Active Problem List   Diagnosis Date Noted  . Complete tear of right rotator cuff 09/07/2019  . Pain in right shoulder 07/27/2019  . Pre-diabetes 12/21/2015  . Abnormal stress test 12/20/2015  . Hypertension   . Coronary artery disease   . Hyperlipemia     Everlean Alstrom. Graylon Good, PT, DPT 11/15/19, 2:29 PM  [facility] [address], [place], [address] Phone: [phone]   Fax:  [phone]  Name: Kevin Stewart MRN: [id number] Date of Birth: Nov 19, 1940

## 2019-11-17 ENCOUNTER — Encounter: Payer: Self-pay | Admitting: Physical Therapy

## 2019-11-17 ENCOUNTER — Other Ambulatory Visit: Payer: Self-pay

## 2019-11-17 ENCOUNTER — Ambulatory Visit: Payer: Medicare Other | Admitting: Physical Therapy

## 2019-11-17 DIAGNOSIS — M6281 Muscle weakness (generalized): Secondary | ICD-10-CM

## 2019-11-17 DIAGNOSIS — M25511 Pain in right shoulder: Secondary | ICD-10-CM | POA: Diagnosis not present

## 2019-11-17 DIAGNOSIS — M25611 Stiffness of right shoulder, not elsewhere classified: Secondary | ICD-10-CM

## 2019-11-17 DIAGNOSIS — G8929 Other chronic pain: Secondary | ICD-10-CM

## 2019-11-17 NOTE — Therapy (Signed)
[facility] [address] [place], [address] Phone: [phone]   Fax:  [phone]  Physical Therapy Treatment / Progress Note / Re-Certification Reporting period: 10/20/2019 - 11/17/2019  Patient Details  Name: FRAZIER BALFOUR MRN: [id number] Date of Birth: 06-14-1941 Referring Provider (PT): Garald Balding, MD   Encounter Date: 11/17/2019  PT End of Session - 11/17/19 1427    Visit Number  19    Number of Visits  24    Date for PT Re-Evaluation  12/06/19    Authorization Type  [employer] medicare reporting period from 10/20/2019    Authorization Time Period  N/A    Authorization - Visit Number  9    Authorization - Number of Visits  10    Progress Note Due on Visit  20   Next FOTO due = 4/26   PT Start Time  1025    PT Stop Time  1105    PT Time Calculation (min)  40 min    Activity Tolerance  Patient tolerated treatment well;No increased pain    Behavior During Therapy  WFL for tasks assessed/performed       Past Medical History:  Diagnosis Date  . Arthritis    "hands" (12/20/2015)  . Basal cell carcinoma, face   . Coronary artery disease    a. 2003 - light MI per patient; s/p Cypher DES to prox LAD in [facility].  Marland Kitchen Hx of adenomatous colonic polyps   . Hyperlipemia   . Hyperlipidemia   . Hypertension   . Kidney stones 1978   "passed them"  . Melanoma of right side of neck ([place])    removed posterior neck [id number]/312013  . Migraine    "stopped in 2000 when I had my heart attack"  . Myocardial infarction ([place]) 2000  . Pre-diabetes    checks cbg q month  . Rotator cuff tear, right   . Wears dentures    upper-lower partial  . Wears glasses     Past Surgical History:  Procedure Laterality Date  . CARDIAC CATHETERIZATION N/A 12/20/2015   Procedure: Left Heart Cath and Coronary Angiography;  Surgeon: Belva Crome, MD;  Location: [place] [facility];  Service: Cardiovascular;  Laterality: N/A;  .  CARDIAC CATHETERIZATION N/A 12/20/2015   Procedure: Coronary Stent Intervention;  Surgeon: Belva Crome, MD;  Location: [place] [facility];  Service: Cardiovascular;  Laterality: N/A;  . CATARACT EXTRACTION W/ INTRAOCULAR LENS  IMPLANT, BILATERAL Bilateral 2011  . CIRCUMCISION  age 51  . COLONOSCOPY    . CORONARY ANGIOPLASTY WITH STENT PLACEMENT  2000   "1 stent"  . CORONARY ANGIOPLASTY WITH STENT PLACEMENT  12/20/2015   "1 stent"  . CYSTOSCOPY/URETEROSCOPY/HOLMIUM LASER/STENT PLACEMENT Right 08/03/2019   Procedure: CYSTOSCOPY/RETROGRADE/URETEROSCOPY//STENT PLACEMENT;  Surgeon: Ceasar Mons, MD;  Location: [facility];  Service: Urology;  Laterality: Right;  . HEMORRHOID SURGERY     "burned them off"  . MELANOMA EXCISION WITH SENTINEL LYMPH NODE BIOPSY  07/21/2012   Procedure: MELANOMA EXCISION WITH SENTINEL LYMPH NODE BIOPSY;  Surgeon: Rozetta Nunnery, MD;  Location: [facility];  Service: General;  Laterality: N/A;  melanoma excision with sentinel node biopsy   posterior right neck  . TONSILLECTOMY  1961    There were no vitals filed for this visit.  Subjective Assessment - 11/17/19 1026    Subjective  Patient reports he is doing well this morning. He hooked  his R thumb on a tomato cage this morning around 9am and he has it bandaged. Taking a while to stop bleeding, but he is confident it will. States his shoulder felt good after the soft tissue mobilization last sesion. Continues to have a bit of soreness following PT and in the mornings but it clears up as the day goes on. Is doing HEP without difficulty. He reports he feels physical therapy is helping him and he is able to reach up higher and getting more movement in his shoulder.    Pertinent History  Patient is a 79 y.o. male who presents to outpatient physical therapy with a referral for medical diagnosis chonic R shoulder pain, nontraumatic complete tear of R rotator cuff. This patient's  chief complaints consist of R shoulder pain, stiffness, weakness, surgical precautions s/p R RTC repair 09/02/2019 leading to the following functional deficits: inability to lift or use R UE for activities such as reaching, lifting, carrying, stabilizing, pushing, pulling, grooming, ADLs, IADLs, yardwork, farmwork, community and social activity. Relevant past medical history and comorbidities include hx of MI (2000) with stent placement x2 (2017, 2000), skin cancer, carcinoma, melanoma (removed 6 years ago), pre-diabetes, HTN, arthritis, kidney stones, cataract implants, hx of adenomatous colonic polyps. Denies lung problem, seizures, stroke.    Diagnostic tests  Pre-Op MRI report 08/17/2019: "IMPRESSION:1. Supraspinatus tendinosis with full-thickness non-retracted tearof the anterior tendon fibers in the region of the critical zone.2. Subscapularis tendinosis with partial-thickness tearing.3. Intra-articular biceps tendinosis with partial tearing.4. Glenohumeral and acromioclavicular osteoarthritis."    Currently in Pain?  Yes    Pain Score  --   rates  soreness less than 1   Pain Location  Shoulder    Pain Orientation  Right    Pain Descriptors / Indicators  Sore    Pain Type  Surgical pain    Effect of Pain on Daily Activities  difficulty with push leaf blower, any use of the R arm (ADLs, IADLs, social and community activities, pushing, pulling, stabilizing, lifting, carrying, etc, occasionally walkes him from sleeping, but much better .Overall these deficits are getting better but he is not yet back to 100%.  He can now put his belt on easier, reach up to get the tooth paste, he can get his seat belt and hook it, he can put in the key and turn it for his car.        [facility] PT Assessment - 11/17/19 0001      Assessment   Medical Diagnosis  R shoulder pain, nontraumatic complete tear of R rotator cuff    Referring Provider (PT)  Garald Balding, MD    Onset Date/Surgical Date  09/02/19    Hand  Dominance  Right    Next MD Visit  09/23/2019    Prior Therapy  none for this condition prior to current episode of care      Precautions   Precautions  Other (comment)    Precaution Comments  PROM per referral    Required Braces or Orthoses  Sling   R shoulder     [place] residence    Living Arrangements  Spouse/significant other    Type of [place] to enter    Entrance Stairs-Number of Steps  7    Entrance Stairs-Rails  Right    [place]  One level    [place] - single point  ambulates carrying cane     Prior Function   Level of Independence  Independent    Vocation  Retired    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   Tremor:R UE with fine motor tasks and at end range shoulder motion   Patient wearing R sling without adductor wedge upon arrival.  Bed mobility: Supine <> sit mod I with increased time to protect R arm  Transfers: sit <> stand I  Gait: grossly WFL for household and short community ambulation. More detailed gait analysis deferred to later date as needed.   PERIPHERAL JOINT MOTION (in degrees)  Active Range of Motion (AROM) *Indicates pain 09/13/19 10/18/19 11/17/19  Joint/Motion R/L R/L R/L  Shoulder     Flexion /140 90*/ 130*/  Extension /72 48*/ 50*/  Abduction /140 50*/ 112*/  External rotation /35 55*/ 75*/  Internal rotation  /T6 L5*/ L5*/  Comments: L AROMat neutral position. AROM at R shoulder deferred due to post-op precautions. B elbow and wrist AROM WFL. Slight lack of elbow extension B (10/18/2019); elbow appears WFL, end range pain with all motions (11/17/2019);   R shoulder AAROM (on wall) - flexion 138 - scaption 135  Passive Range of Motion (PROM) *Indicates pain 09/13/19 10/18/19 11/17/19  Joint/Motion R/L R/L R/L  Shoulder     Flexion 110*/150 140*/ 152*/  Extension / / /  Abduction 83*/175 110*/ 110*/  External rotation 50/85 60*/ 35*/  Internal rotation Tissue approx/85 80*/ 40*/  Comments: RPROM, L AROMat neutral position (09/13/2019). 45 degrees scaption for ER/IR on 10/18/19. 90 degrees abduction for ER/IR on 11/17/2019.  MUSCLE PERFORMANCE (MMT):  *Indicates pain 09/13/2019 10/18/19 11/17/19  Joint/Motion R/L R/L R/L  Shoulder     Flexion /5 2*/ 4-*/5  Extension / 2*/ /  Abduction /5 2*/ 3+*/5  External rotation /4 2*/ 3+*/4  Internal rotation /4+ 2*/ 4+/5  Elbow     Flexion /5 3/ 3+/5  Extension /5 4/ 4/5  Hand     Grip WFL R >L WFL /  Comments:R shoulder and elbow MMT deferred due to post-op precautions (09/13/2019); grip deferred due to catching skin on something this morning, no concers about strength in hands, end range discomfort for all motions with * (11/17/2019);   ACCESSORY MOTION:  R GH joint hypomobile in posterior and inferior directions.   PALPATION:  STM feels good over tissues surrounding right shoulder.   EDUCATION/COGNITION: Patient is alert and oriented X 4.  Objective measurements completed on examination: See above findings.   TREATMENT: R RTC repair 09/02/2019 10weeks,6days (11/17/2019)  Therapeutic exercise:to centralize symptoms and improve ROM, strength, muscular endurance, and activity tolerance required for successful completion of functional activities.  - standing AAROM R shoulder wall slide into flexion, scaption, x 20  each. (mild end range discomfort, cuing to keep this minimal). - measurements to assess progress (see above).  - Education on diagnosis,  prognosis, POC, anatomy and physiology of current condition.   Manual therapy: to reduce pain and tissue tension, improve range of motion, neuromodulation, in order to promote improved ability to complete functional activities. - PROM R shoulder flexion, abduction/scaption (with slight GHJ caudle glide with movement), IR, ER x 10 each with 5 second hold at end range with gentle OP as tolerated - R GHJ mobilizations grade III-IV, caudal, distraction, AP, 1-3 x 30 seconds each direction.  - STM to anterior, lateral and posterior R shoulder for improved comfort and decreased tension.   HOME EXERCISE PROGRAM Access Code: [email or web address] URL: [email or web address] Date: 11/15/2019 Prepared by: Rosita Kea  Exercises Supine Shoulder Flexion Extension AAROM with [place] - 1 x daily - 20 reps - 5 seconds hold Supine Shoulder Circles - 1-2 x daily - 2-3 sets - 20 reps Shoulder Flexion Wall Slide with Towel - 1 x daily - 1 sets - 20 reps - 5 seconds hold Row with band/cable - 1-2 x daily - 30 reps Shoulder Internal Rotation with Resistance - 1 x daily - 1 sets - 20 reps - 3 seconds hold Shoulder External Rotation with Anchored Resistance - 1 x daily - 1 sets - 20 reps - 3 seconds hold Standing Single Arm Bicep Curls with Rotation and Dumbbell - 1 x daily - 2-3 sets - 10 reps Standing Shoulder Scaption - 1 x daily - 2-3 sets - 10 reps Supine Shoulder Flexion Extension Full Range AROM Sidelying Shoulder Abduction Palm Forward    PT Education - 11/17/19 1435    Education Details  Exercise purpose/form. Self management techniques. progress, POC    Person(s) Educated  Patient    Methods  Explanation;Demonstration;Tactile cues;Verbal cues    Comprehension  Verbalized understanding;Returned demonstration;Verbal cues required;Tactile cues required;Need  further instruction       PT Short Term Goals - 09/27/19 1951      PT SHORT TERM GOAL #1   Title  Be independent with initial home exercise program for self-management of symptoms.    Baseline  Initial HEP provided at IE (09/13/2019);    Time  2    Period  Weeks    Status  Achieved    Target Date  09/27/19        PT Long Term Goals - 11/17/19 1432      PT LONG TERM GOAL #1   Title  Be independent with a long-term home exercise program for self-management of symptoms.    Baseline  Initial HEP provided at IE (09/13/2019); currently participating in appropriate HEP (10/18/2019; 11/17/2019);    Time  12    Period  Weeks    Status  Partially Met    Target Date  02/09/20      PT LONG TERM GOAL #2   Title  Demonstrate improved FOTO score to equal or greater than 65 to demonstrate improvement in overall condition and self-reported functional ability.    Baseline  FOTO = 43 (09/13/2019); FOTO = 54 10/18/2019); 60 (11/17/2019);    Time  12    Period  Weeks    Status  Partially Met    Target Date  02/09/20      PT LONG TERM GOAL #3   Title  Have R shoulder AROM equal or greater than L shoulder AROM with no compensations or increase in pain in all planes except intermittent end range discomfort to allow patient to complete valued activities with less difficulty.    Baseline  see objective exam, AROM deferred  due to post-op precautions (09/13/2019); able to participate in gentle AROM - see objective (10/18/2019); significant improvement (09/19/2019);    Time  12    Period  Weeks    Status  Partially Met    Target Date  02/09/20      PT LONG TERM GOAL #4   Title  Improve R shoulder strength to 4+/5 for improved ability to allow patient to complete valued functional tasks such as reaching overhead and yardwork with less difficulty.    Baseline  testing deferred due to post-op precautions (09/13/2019); now able to complete AROM in limited range 2/5 (10/18/2019); able to tolerate moderate pressure  (11/17/2019);    Time  12    Period  Weeks    Status  Partially Met    Target Date  02/09/20      PT LONG TERM GOAL #5   Title  Complete community, work and/or recreational activities without limitation due to current condition.    Baseline  inability to lift or use R UE for activities such as reaching, lifting, carrying, stabilizing, pushing, pulling, grooming, ADLs, IADLs, yardwork, farmwork, community and social activity (09/13/2019); able to tie shows, improved gromming, ADLs, slight reaching and light carrying (10/18/2019); improving (11/17/2019);    Time  12    Period  Weeks    Status  Partially Met    Target Date  02/09/20            Plan - 11/17/19 1431    Clinical Impression Statement  Patient has attended 19 physical therapy sessions this episode of care and continues to make steady progress towards goals. Patient participates well in HEP and has demonstrated significant improvements in AROM since last progress assessment. He continues to progress in PROM as well, but continues to have stiffness in abduction, IR, and ER that is gradually improving. Patient was able to tolerate more load for MMT but was not tested at max force due to proximity of surgery. Patient reports improving ability to participate in usual activities and shows improvement of FOTO score to 60 point, demonstrating a significant improvement in self-reported function. Pt continues to present  with significant pain, stiffness, muscle performance (strength/power/endurance), coordination, motor control, muscle spasm, ROM impairments that are limiting ability to complete usual activities including things that require him to lift or use R UE for activities such as reaching, lifting, carrying, stabilizing, pushing, pulling, grooming, ADLs, IADLs, yardwork, farmwork, community and social activity without difficulty. Patient will benefit from continued skilled physical therapy intervention to address current body structure  impairments and activity limitations to improve function and work towards goals set in current POC in order to return to prior level of function or maximal functional improvement.    Personal Factors and Comorbidities  Age;Behavior Pattern;Comorbidity 3+;Education;Social Background;Time since onset of injury/illness/exacerbation;Past/Current Experience;Fitness    Comorbidities  hx of MI (2000) with stent placement x2 (2017, 2000), skin cancer, carcinoma, melanoma (removed 6 years ago), pre-diabetes, HTN, arthritis, kidney stones, cataract implants, hx of adenomatous colonic polyps. Denies lung problem, seizures, stroke    Examination-Activity Limitations  Bed Mobility;Bathing;Reach Overhead;Self Feeding;Carry;Dressing;Hygiene/Grooming;Sleep;Lift;Toileting    Examination-Participation Restrictions  Driving;Community Activity;Cleaning;Yard Work;Meal Prep;Other    Stability/Clinical Decision Making  Stable/Uncomplicated    Rehab Potential  Good    PT Frequency  2x / week    PT Duration  12 weeks    PT Treatment/Interventions  ADLs/Self Care Home Management;Aquatic Therapy;Electrical Stimulation;Cryotherapy;Moist Heat;DME Instruction;Therapeutic activities;Therapeutic exercise;Balance training;Neuromuscular re-education;Patient/family education;Manual techniques;Compression bandaging;Scar mobilization;Dry needling;Passive range of motion;Joint  Manipulations;Spinal Manipulations    PT Next Visit Plan  Exercises/manual for improved ROM, progressive shoulder girdle strengthening, stability, and functional activities as tolerated.    PT Home Exercise Plan  Medbridge Access Code: [email or web address]    Consulted and Agree with Plan of Care  Patient       Patient will benefit from skilled therapeutic intervention in order to improve the following deficits and impairments:  Decreased knowledge of use of DME, Decreased skin integrity, Increased fascial restricitons, Pain, Increased muscle spasms, Decreased scar mobility,  Decreased coordination, Decreased activity tolerance, Decreased endurance, Decreased range of motion, Decreased strength, Hypomobility, Impaired perceived functional ability, Impaired UE functional use, Impaired flexibility, Increased edema, Decreased safety awareness, Decreased knowledge of precautions  Visit Diagnosis: Chronic right shoulder pain  Stiffness of right shoulder, not elsewhere classified  Muscle weakness (generalized)     Problem List Patient Active Problem List   Diagnosis Date Noted  . Complete tear of right rotator cuff 09/07/2019  . Pain in right shoulder 07/27/2019  . Pre-diabetes 12/21/2015  . Abnormal stress test 12/20/2015  . Hypertension   . Coronary artery disease   . Hyperlipemia     Everlean Alstrom. Graylon Good, PT, DPT 11/17/19, 2:36 PM  [facility] [address] [place], [address] Phone: [phone]   Fax:  [phone]  Name: YISRAEL OBRYAN MRN: [id number] Date of Birth: 06-20-1941

## 2019-11-18 DIAGNOSIS — S61011A Laceration without foreign body of right thumb without damage to nail, initial encounter: Secondary | ICD-10-CM | POA: Diagnosis not present

## 2019-11-23 ENCOUNTER — Other Ambulatory Visit: Payer: Self-pay

## 2019-11-23 ENCOUNTER — Ambulatory Visit: Payer: Medicare Other | Admitting: Orthopaedic Surgery

## 2019-11-23 ENCOUNTER — Ambulatory Visit: Payer: Medicare Other | Attending: Orthopaedic Surgery | Admitting: Physical Therapy

## 2019-11-23 ENCOUNTER — Encounter: Payer: Self-pay | Admitting: Orthopaedic Surgery

## 2019-11-23 ENCOUNTER — Encounter: Payer: Self-pay | Admitting: Physical Therapy

## 2019-11-23 VITALS — Ht 71.0 in | Wt 165.0 lb

## 2019-11-23 DIAGNOSIS — M25511 Pain in right shoulder: Secondary | ICD-10-CM

## 2019-11-23 DIAGNOSIS — M6281 Muscle weakness (generalized): Secondary | ICD-10-CM | POA: Diagnosis not present

## 2019-11-23 DIAGNOSIS — M25611 Stiffness of right shoulder, not elsewhere classified: Secondary | ICD-10-CM | POA: Diagnosis not present

## 2019-11-23 DIAGNOSIS — G8929 Other chronic pain: Secondary | ICD-10-CM

## 2019-11-23 NOTE — Therapy (Signed)
[facility] [address], [place], [address] Phone: [phone]   Fax:  [phone]  Physical Therapy Treatment / Progress Note Reporting Period: 10/20/2019 - 11/23/2019  Patient Details  Name: Kevin Stewart MRN: [id number] Date of Birth: 1940/11/29 Referring Provider (PT): Garald Balding, MD   Encounter Date: 11/23/2019  PT End of Session - 11/23/19 1947    Visit Number  20    Number of Visits  43    Date for PT Re-Evaluation  02/09/20    Authorization Type  [employer] medicare reporting period from 10/20/2019    Authorization Time Period  N/A    Authorization - Visit Number  10    Authorization - Number of Visits  10    Progress Note Due on Visit  20   Next FOTO due = 4/26   PT Start Time  1745    PT Stop Time  1830    PT Time Calculation (min)  45 min    Activity Tolerance  Patient tolerated treatment well;No increased pain    Behavior During Therapy  WFL for tasks assessed/performed       Past Medical History:  Diagnosis Date  . Arthritis    "hands" (12/20/2015)  . Basal cell carcinoma, face   . Coronary artery disease    a. 2003 - light MI per patient; s/p Cypher DES to prox LAD in [facility].  Marland Kitchen Hx of adenomatous colonic polyps   . Hyperlipemia   . Hyperlipidemia   . Hypertension   . Kidney stones 1978   "passed them"  . Melanoma of right side of neck ([place])    removed posterior neck 12/312013  . Migraine    "stopped in 2000 when I had my heart attack"  . Myocardial infarction ([place]) 2000  . Pre-diabetes    checks cbg q month  . Rotator cuff tear, right   . Wears dentures    upper-lower partial  . Wears glasses     Past Surgical History:  Procedure Laterality Date  . CARDIAC CATHETERIZATION N/A 12/20/2015   Procedure: Left Heart Cath and Coronary Angiography;  Surgeon: Belva Crome, MD;  Location: [place] [facility];  Service: Cardiovascular;  Laterality: N/A;  . CARDIAC  CATHETERIZATION N/A 12/20/2015   Procedure: Coronary Stent Intervention;  Surgeon: Belva Crome, MD;  Location: [place] [facility];  Service: Cardiovascular;  Laterality: N/A;  . CATARACT EXTRACTION W/ INTRAOCULAR LENS  IMPLANT, BILATERAL Bilateral 2011  . CIRCUMCISION  age 26  . COLONOSCOPY    . CORONARY ANGIOPLASTY WITH STENT PLACEMENT  2000   "1 stent"  . CORONARY ANGIOPLASTY WITH STENT PLACEMENT  12/20/2015   "1 stent"  . CYSTOSCOPY/URETEROSCOPY/HOLMIUM LASER/STENT PLACEMENT Right 08/03/2019   Procedure: CYSTOSCOPY/RETROGRADE/URETEROSCOPY//STENT PLACEMENT;  Surgeon: Ceasar Mons, MD;  Location: [facility];  Service: Urology;  Laterality: Right;  . HEMORRHOID SURGERY     "burned them off"  . MELANOMA EXCISION WITH SENTINEL LYMPH NODE BIOPSY  07/21/2012   Procedure: MELANOMA EXCISION WITH SENTINEL LYMPH NODE BIOPSY;  Surgeon: Rozetta Nunnery, MD;  Location: [facility];  Service: General;  Laterality: N/A;  melanoma excision with sentinel node biopsy   posterior right neck  . TONSILLECTOMY  1961    There were no vitals filed for this visit.  Subjective Assessment - 11/23/19 1743    Subjective  Patient reports he saw his surgeon today for a follow up and  he was pleased with his progress. Would like to see him back in 6 weeks. Pt reports doctor told him the morning soreness is normal and may take up to a year to go away. Pt reports mild soreness following last treatment session. Patient is feeling more confident since his surgeon said he cannot hurt his shoulder as easliy as he thought before.    Pertinent History  Patient is a 79 y.o. male who presents to outpatient physical therapy with a referral for medical diagnosis chonic R shoulder pain, nontraumatic complete tear of R rotator cuff. This patient's chief complaints consist of R shoulder pain, stiffness, weakness, surgical precautions s/p R RTC repair 09/02/2019 leading to the following  functional deficits: inability to lift or use R UE for activities such as reaching, lifting, carrying, stabilizing, pushing, pulling, grooming, ADLs, IADLs, yardwork, farmwork, community and social activity. Relevant past medical history and comorbidities include hx of MI (2000) with stent placement x2 (2017, 2000), skin cancer, carcinoma, melanoma (removed 6 years ago), pre-diabetes, HTN, arthritis, kidney stones, cataract implants, hx of adenomatous colonic polyps. Denies lung problem, seizures, stroke.    Diagnostic tests  Pre-Op MRI report 08/17/2019: "IMPRESSION:1. Supraspinatus tendinosis with full-thickness non-retracted tearof the anterior tendon fibers in the region of the critical zone.2. Subscapularis tendinosis with partial-thickness tearing.3. Intra-articular biceps tendinosis with partial tearing.4. Glenohumeral and acromioclavicular osteoarthritis."    Currently in Pain?  No/denies      OBJECTIVE   FOTO = 60(11/17/2019)  R shoulder PROM(last measured 11/15/19) - flexion: 160* - abduction (in slight scaption): 160* *ERP   R shoulder AAROM (on wall) - flexion 140 - scaption 138 - ER: 65*    TREATMENT: R RTC repair 09/02/2019 11weeks,5days (11/23/2019)  Therapeutic exercise:to centralize symptoms and improve ROM, strength, muscular endurance, and activity tolerance required for successful completion of functional activities.  - standing AAROM R shoulder wall slide into flexion, scaption, x 20 each. (mild end range discomfort, cuing to allow a bit more of this as long as it resolves when pressure released).  Review/update of HEP:  - standing AROM B scaption to available motion with good mechanics, corrected form and updated range for HEP, used mirror briefly for biofeedback to allow pt to see proper scapular motion,  2x10-20 - standing dynamic R shoulder IR with towel roll under arm,1x20 with yellow theraband, x20 with red theraband, 3secH. - standing dynamic R  shoulder ER with towel roll under arm,1x20 with yellow theraband, increased ROM and distance from anchor, did not increase to red yet, 3secH.  -Standing R single rows with scapular retraction for improved postural and shoulder girdle strengthening and mobility. Required instruction for technique and cuing to retract, posteriorly tilt, and depress scapulae.1x20 with doubletheraband, 1x20 with double green theraband, 3secH. - standing R bicep curl, forearm supinated, 1x20with 5# DB. - Education on diagnosis, prognosis, POC, anatomy and physiology of current condition.   Manual therapy:to reduce pain and tissue tension, improve range of motion, neuromodulation, in order to promote improved ability to complete functional activities. - PROM R shoulder flexion, abduction/scaption (with slight GHJ caudle glide with movement), IR, ER x 10 each with 5 second hold at end range with gentle OP as tolerated - R GHJ mobilizations grade III-IV, caudal, distraction, AP, 1-2 x 30-60 seconds seconds each direction. - STM to anterior, lateral and posterior R shoulder for improved comfort and decreased tension.  HOME EXERCISE PROGRAM Access Code: [email or web address] URL: [email or web address] Date: 11/15/2019 Prepared by: Rosita Kea  Exercises Supine Shoulder Flexion  Extension AAROM with Dowel - 1 x daily - 20 reps - 5 seconds hold Supine Shoulder Circles - 1-2 x daily - 2-3 sets - 20 reps Shoulder Flexion Wall Slide with Towel - 1 x daily - 1 sets - 20 reps - 5 seconds hold Row with band/cable - 1-2 x daily - 30 reps Shoulder Internal Rotation with Resistance - 1 x daily - 1 sets - 20 reps - 3 seconds hold Shoulder External Rotation with Anchored Resistance - 1 x daily - 1 sets - 20 reps - 3 seconds hold Standing Single Arm Bicep Curls with Rotation and Dumbbell - 1 x daily - 2-3 sets - 10 reps Standing Shoulder Scaption - 1 x daily - 2-3 sets - 10 reps Supine Shoulder Flexion Extension Full  Range AROM Sidelying Shoulder Abduction Palm Forward   PT Education - 11/23/19 1947    Education Details  Exercise purpose/form. Self management techniques. HEP    Person(s) Educated  Patient    Methods  Explanation;Demonstration;Tactile cues;Verbal cues    Comprehension  Verbalized understanding;Returned demonstration;Verbal cues required;Tactile cues required;Need further instruction       PT Short Term Goals - 09/27/19 1951      PT SHORT TERM GOAL #1   Title  Be independent with initial home exercise program for self-management of symptoms.    Baseline  Initial HEP provided at IE (09/13/2019);    Time  2    Period  Weeks    Status  Achieved    Target Date  09/27/19        PT Long Term Goals - 11/17/19 1432      PT LONG TERM GOAL #1   Title  Be independent with a long-term home exercise program for self-management of symptoms.    Baseline  Initial HEP provided at IE (09/13/2019); currently participating in appropriate HEP (10/18/2019; 11/17/2019);    Time  12    Period  Weeks    Status  Partially Met    Target Date  02/09/20      PT LONG TERM GOAL #2   Title  Demonstrate improved FOTO score to equal or greater than 65 to demonstrate improvement in overall condition and self-reported functional ability.    Baseline  FOTO = 43 (09/13/2019); FOTO = 54 10/18/2019); 60 (11/17/2019);    Time  12    Period  Weeks    Status  Partially Met    Target Date  02/09/20      PT LONG TERM GOAL #3   Title  Have R shoulder AROM equal or greater than L shoulder AROM with no compensations or increase in pain in all planes except intermittent end range discomfort to allow patient to complete valued activities with less difficulty.    Baseline  see objective exam, AROM deferred due to post-op precautions (09/13/2019); able to participate in gentle AROM - see objective (10/18/2019); significant improvement (09/19/2019);    Time  12    Period  Weeks    Status  Partially Met    Target Date  02/09/20       PT LONG TERM GOAL #4   Title  Improve R shoulder strength to 4+/5 for improved ability to allow patient to complete valued functional tasks such as reaching overhead and yardwork with less difficulty.    Baseline  testing deferred due to post-op precautions (09/13/2019); now able to complete AROM in limited range 2/5 (10/18/2019); able to tolerate moderate pressure (11/17/2019);    Time  12    Period  Weeks    Status  Partially Met    Target Date  02/09/20      PT LONG TERM GOAL #5   Title  Complete community, work and/or recreational activities without limitation due to current condition.    Baseline  inability to lift or use R UE for activities such as reaching, lifting, carrying, stabilizing, pushing, pulling, grooming, ADLs, IADLs, yardwork, farmwork, community and social activity (09/13/2019); able to tie shows, improved gromming, ADLs, slight reaching and light carrying (10/18/2019); improving (11/17/2019);    Time  12    Period  Weeks    Status  Partially Met    Target Date  02/09/20            Plan - 11/23/19 1946    Clinical Impression Statement  Patient has attended 20 physical therapy sessions this episode of care and continues to make steady progress towards goals. Patient continues to participate well in HEP and continues to demonstrate significant improvements in AROM.  He continues to progress in PROM as well with significant improvement since last session.  He does continue to have stiffness in abduction, IR, and ER that is steadily improving. Patient was able to tolerate increasing levels of resistance during exercise and MMT. Patient reports improving ability to participate in usual activities and shows improvement of FOTO score to 60 point, demonstrating a significant improvement in self-reported function. Pt continues to present  with significant pain, stiffness, muscle performance (strength/power/endurance), coordination, motor control, muscle spasm, ROM impairments that  are limiting ability to complete usual activities including things that require him to lift or use R UE for activities such as reaching, lifting, carrying, stabilizing, pushing, pulling, grooming, ADLs, IADLs, yardwork, farmwork, community and social activity without difficulty. Patient will benefit from continued skilled physical therapy intervention to address current body structure impairments and activity limitations to improve function and work towards goals set in current POC in order to return to prior level of function or maximal functional improvement    Personal Factors and Comorbidities  Age;Behavior Pattern;Comorbidity 3+;Education;Social Background;Time since onset of injury/illness/exacerbation;Past/Current Experience;Fitness    Comorbidities  hx of MI (2000) with stent placement x2 (2017, 2000), skin cancer, carcinoma, melanoma (removed 6 years ago), pre-diabetes, HTN, arthritis, kidney stones, cataract implants, hx of adenomatous colonic polyps. Denies lung problem, seizures, stroke    Examination-Activity Limitations  Bed Mobility;Bathing;Reach Overhead;Self Feeding;Carry;Dressing;Hygiene/Grooming;Sleep;Lift;Toileting    Examination-Participation Restrictions  Driving;Community Activity;Cleaning;Yard Work;Meal Prep;Other    Stability/Clinical Decision Making  Stable/Uncomplicated    Rehab Potential  Good    PT Frequency  2x / week    PT Duration  12 weeks    PT Treatment/Interventions  ADLs/Self Care Home Management;Aquatic Therapy;Electrical Stimulation;Cryotherapy;Moist Heat;DME Instruction;Therapeutic activities;Therapeutic exercise;Balance training;Neuromuscular re-education;Patient/family education;Manual techniques;Compression bandaging;Scar mobilization;Dry needling;Passive range of motion;Joint Manipulations;Spinal Manipulations    PT Next Visit Plan  Exercises/manual for improved ROM, progressive shoulder girdle strengthening, stability, and functional activities as tolerated.     PT Home Exercise Plan  Medbridge Access Code: [email or web address]    Consulted and Agree with Plan of Care  Patient       Patient will benefit from skilled therapeutic intervention in order to improve the following deficits and impairments:  Decreased knowledge of use of DME, Decreased skin integrity, Increased fascial restricitons, Pain, Increased muscle spasms, Decreased scar mobility, Decreased coordination, Decreased activity tolerance, Decreased endurance, Decreased range of motion, Decreased strength, Hypomobility, Impaired perceived functional ability, Impaired UE functional use, Impaired flexibility, Increased edema, Decreased  safety awareness, Decreased knowledge of precautions  Visit Diagnosis: Chronic right shoulder pain  Stiffness of right shoulder, not elsewhere classified  Muscle weakness (generalized)     Problem List Patient Active Problem List   Diagnosis Date Noted  . Complete tear of right rotator cuff 09/07/2019  . Pain in right shoulder 07/27/2019  . Pre-diabetes 12/21/2015  . Abnormal stress test 12/20/2015  . Hypertension   . Coronary artery disease   . Hyperlipemia     Everlean Alstrom. Graylon Good, PT, DPT 11/23/19, 7:49 PM   [facility] [address], [place], [address] Phone: [phone]   Fax:  [phone]  Name: Kevin Stewart MRN: [id number] Date of Birth: 29-Jan-1941

## 2019-11-23 NOTE — Progress Notes (Signed)
Office Visit Note   Patient: Kevin Stewart           Date of Birth: March 21, 1941           MRN: [id number] Visit Date: 11/23/2019              Requested by: [name], NP [address] [place],   [address] PCP: [name], NP   Assessment & Plan: Visit Diagnoses:  1. Chronic right shoulder pain     11 weeks status post rotator cuff tear repair right shoulder doing quite well.  Continues to go to physical therapy for strengthening exercises.  He had considerable difficulty raising his right arm over his head before surgery but now can raise it over his head and even scratch the lower part of his back.  He is still weak and working on exercises.  We have talked about activity modification and I will see him back in 6 weeks  Follow-Up Instructions: Return in about 6 weeks (around 01/04/2020).   Orders:  No orders of the defined types were placed in this encounter.  No orders of the defined types were placed in this encounter.     Procedures: No procedures performed   Clinical Data: No additional findings.   Subjective: Chief Complaint  Patient presents with  . Right Shoulder - Follow-up    Right shoulder scope DOS 09/02/2019  Patient presents today for follow up on his right shoulder. He had a right shoulder arthroscopy with rotator cuff tear repair on 09/02/2019. He is now 11 weeks out from surgery. He is in physical therapy twice weekly. He does not take anything for pain. He does have soreness in the morning upon getting up, but that improves after moving around.   HPI  Review of Systems  Constitutional: Negative for fatigue.  HENT: Negative for ear pain.   Eyes: Negative for pain.  Respiratory: Negative for shortness of breath.   Cardiovascular: Negative for leg swelling.  Gastrointestinal: Negative for constipation and diarrhea.  Endocrine: Negative for cold intolerance and heat intolerance.  Genitourinary: Negative for difficulty urinating.   Musculoskeletal: Negative for joint swelling.  Skin: Negative for rash.  Allergic/Immunologic: Negative for food allergies.  Neurological: Negative for weakness.  Hematological: Bruises/bleeds easily.  Psychiatric/Behavioral: Negative for sleep disturbance.     Objective: Vital Signs: Ht 5\' 11"  (1.803 m)   Wt 165 lb (74.8 kg)   BMI 23.01 kg/m   Physical Exam  Ortho Exam right shoulder with negative impingement.  No crepitation.  Incisions of healed nicely.  Able to place his arm fully overhead actively and even internally rotate without any problem.  Still has some weakness.  Negative empty can testing.  Good grip and release  Specialty Comments:  No specialty comments available.  Imaging: No results found.   PMFS History: Patient Active Problem List   Diagnosis Date Noted  . Complete tear of right rotator cuff 09/07/2019  . Pain in right shoulder 07/27/2019  . Pre-diabetes 12/21/2015  . Abnormal stress test 12/20/2015  . Hypertension   . Coronary artery disease   . Hyperlipemia    Past Medical History:  Diagnosis Date  . Arthritis    "hands" (12/20/2015)  . Basal cell carcinoma, face   . Coronary artery disease    a. 2003 - light MI per patient; s/p Cypher DES to prox LAD in [facility].  Marland Kitchen Hx of adenomatous colonic polyps   . Hyperlipemia   . Hyperlipidemia   .  Hypertension   . Kidney stones 1978   "passed them"  . Melanoma of right side of neck ([place])    removed posterior neck 12/312013  . Migraine    "stopped in 2000 when I had my heart attack"  . Myocardial infarction ([place]) 2000  . Pre-diabetes    checks cbg q month  . Rotator cuff tear, right   . Wears dentures    upper-lower partial  . Wears glasses     Family History  Problem Relation Age of Onset  . Heart attack Mother 24  . Cancer - Other Father 74       pancreatic  . Heart attack Brother 45  . Cancer - Colon Brother 80  . Cancer - Other Sister 41    Past Surgical History:  Procedure  Laterality Date  . CARDIAC CATHETERIZATION N/A 12/20/2015   Procedure: Left Heart Cath and Coronary Angiography;  Surgeon: Belva Crome, MD;  Location: [place] [facility];  Service: Cardiovascular;  Laterality: N/A;  . CARDIAC CATHETERIZATION N/A 12/20/2015   Procedure: Coronary Stent Intervention;  Surgeon: Belva Crome, MD;  Location: [place] [facility];  Service: Cardiovascular;  Laterality: N/A;  . CATARACT EXTRACTION W/ INTRAOCULAR LENS  IMPLANT, BILATERAL Bilateral 2011  . CIRCUMCISION  age 42  . COLONOSCOPY    . CORONARY ANGIOPLASTY WITH STENT PLACEMENT  2000   "1 stent"  . CORONARY ANGIOPLASTY WITH STENT PLACEMENT  12/20/2015   "1 stent"  . CYSTOSCOPY/URETEROSCOPY/HOLMIUM LASER/STENT PLACEMENT Right 08/03/2019   Procedure: CYSTOSCOPY/RETROGRADE/URETEROSCOPY//STENT PLACEMENT;  Surgeon: Ceasar Mons, MD;  Location: [facility];  Service: Urology;  Laterality: Right;  . HEMORRHOID SURGERY     "burned them off"  . MELANOMA EXCISION WITH SENTINEL LYMPH NODE BIOPSY  07/21/2012   Procedure: MELANOMA EXCISION WITH SENTINEL LYMPH NODE BIOPSY;  Surgeon: Rozetta Nunnery, MD;  Location: [place];  Service: General;  Laterality: N/A;  melanoma excision with sentinel node biopsy   posterior right neck  . TONSILLECTOMY  1961   Social History   Occupational History  . 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 Smoking status: Former Smoker    Packs/day: 2.00    Years: 10.00    Pack years: 20.00    Types: Cigarettes    Quit date: 07/10/1973    Years since quitting: 46.4  . Smokeless tobacco: Former Systems developer    Types: Chew  . Tobacco comment: "chewed a little; stopped in 06/1973"  Substance and Sexual Activity  . Alcohol use: Yes    Comment: 12/20/2015 'quit in ~ 2012-2013"  . Drug use: No  . Sexual activity: Never

## 2019-11-25 ENCOUNTER — Ambulatory Visit: Payer: Medicare Other | Admitting: Physical Therapy

## 2019-11-25 ENCOUNTER — Other Ambulatory Visit: Payer: Self-pay

## 2019-11-25 ENCOUNTER — Encounter: Payer: Self-pay | Admitting: Physical Therapy

## 2019-11-25 DIAGNOSIS — M25511 Pain in right shoulder: Secondary | ICD-10-CM | POA: Diagnosis not present

## 2019-11-25 DIAGNOSIS — M6281 Muscle weakness (generalized): Secondary | ICD-10-CM

## 2019-11-25 DIAGNOSIS — G8929 Other chronic pain: Secondary | ICD-10-CM

## 2019-11-25 DIAGNOSIS — M25611 Stiffness of right shoulder, not elsewhere classified: Secondary | ICD-10-CM

## 2019-11-25 NOTE — Therapy (Signed)
[facility] [address], [place], [address] Phone: [phone]   Fax:  [phone]  Physical Therapy Treatment  Patient Details  Name: Kevin Stewart MRN: [id number] Date of Birth: Jul 24, 1940 Referring Provider (PT): Garald Balding, MD   Encounter Date: 11/25/2019  PT End of Session - 11/25/19 1357    Visit Number  21    Number of Visits  43    Date for PT Re-Evaluation  02/09/20    Authorization Type  [employer] medicare reporting period from 11/25/2019    Authorization Time Period  N/A    Authorization - Visit Number  1    Authorization - Number of Visits  10    Progress Note Due on Visit  30   Next FOTO due = 4/26   PT Start Time  1350    PT Stop Time  1430    PT Time Calculation (min)  40 min    Activity Tolerance  Patient tolerated treatment well;No increased pain    Behavior During Therapy  WFL for tasks assessed/performed       Past Medical History:  Diagnosis Date  . Arthritis    "hands" (12/20/2015)  . Basal cell carcinoma, face   . Coronary artery disease    a. 2003 - light MI per patient; s/p Cypher DES to prox LAD in [facility].  Marland Kitchen Hx of adenomatous colonic polyps   . Hyperlipemia   . Hyperlipidemia   . Hypertension   . Kidney stones 1978   "passed them"  . Melanoma of right side of neck ([place])    removed posterior neck [id number]/312013  . Migraine    "stopped in 2000 when I had my heart attack"  . Myocardial infarction ([place]) 2000  . Pre-diabetes    checks cbg q month  . Rotator cuff tear, right   . Wears dentures    upper-lower partial  . Wears glasses     Past Surgical History:  Procedure Laterality Date  . CARDIAC CATHETERIZATION N/A 12/20/2015   Procedure: Left Heart Cath and Coronary Angiography;  Surgeon: Belva Crome, MD;  Location: [facility];  Service: Cardiovascular;  Laterality: N/A;  . CARDIAC CATHETERIZATION N/A 12/20/2015   Procedure: Coronary Stent  Intervention;  Surgeon: Belva Crome, MD;  Location: [facility];  Service: Cardiovascular;  Laterality: N/A;  . CATARACT EXTRACTION W/ INTRAOCULAR LENS  IMPLANT, BILATERAL Bilateral 2011  . CIRCUMCISION  age 44  . COLONOSCOPY    . CORONARY ANGIOPLASTY WITH STENT PLACEMENT  2000   "1 stent"  . CORONARY ANGIOPLASTY WITH STENT PLACEMENT  12/20/2015   "1 stent"  . CYSTOSCOPY/URETEROSCOPY/HOLMIUM LASER/STENT PLACEMENT Right 08/03/2019   Procedure: CYSTOSCOPY/RETROGRADE/URETEROSCOPY//STENT PLACEMENT;  Surgeon: Ceasar Mons, MD;  Location: [facility];  Service: Urology;  Laterality: Right;  . HEMORRHOID SURGERY     "burned them off"  . MELANOMA EXCISION WITH SENTINEL LYMPH NODE BIOPSY  07/21/2012   Procedure: MELANOMA EXCISION WITH SENTINEL LYMPH NODE BIOPSY;  Surgeon: Rozetta Nunnery, MD;  Location: [facility];  Service: General;  Laterality: N/A;  melanoma excision with sentinel node biopsy   posterior right neck  . TONSILLECTOMY  1961    There were no vitals filed for this visit.  Subjective Assessment - 11/25/19 1356    Subjective  Patient reports he is a bit sore today from looking over his right shoulder while brush-hogging. States his shoulder is  doing well and he did not have any inordinate pain or soreness following last treatment session. Continues to participate in HEP as prescribed.    Pertinent History  Patient is a 79 y.o. male who presents to outpatient physical therapy with a referral for medical diagnosis chonic R shoulder pain, nontraumatic complete tear of R rotator cuff. This patient's chief complaints consist of R shoulder pain, stiffness, weakness, surgical precautions s/p R RTC repair 09/02/2019 leading to the following functional deficits: inability to lift or use R UE for activities such as reaching, lifting, carrying, stabilizing, pushing, pulling, grooming, ADLs, IADLs, yardwork, farmwork, community and social activity.  Relevant past medical history and comorbidities include hx of MI (2000) with stent placement x2 (2017, 2000), skin cancer, carcinoma, melanoma (removed 6 years ago), pre-diabetes, HTN, arthritis, kidney stones, cataract implants, hx of adenomatous colonic polyps. Denies lung problem, seizures, stroke.    Diagnostic tests  Pre-Op MRI report 08/17/2019: "IMPRESSION:1. Supraspinatus tendinosis with full-thickness non-retracted tearof the anterior tendon fibers in the region of the critical zone.2. Subscapularis tendinosis with partial-thickness tearing.3. Intra-articular biceps tendinosis with partial tearing.4. Glenohumeral and acromioclavicular osteoarthritis."    Currently in Pain?  No/denies       OBJECTIVE   FOTO = 60(11/17/2019)  R shoulder PROM(last measured 11/25/19) - flexion: 160*  R shoulder AAROM(on wall) - flexion 140 - scaption 138 - ER: 65*  R shoulder AROM:  - FIR: L4    TREATMENT: R RTC repair 09/02/2019 11weeks,5days (11/23/2019)  Therapeutic exercise:to centralize symptoms and improve ROM, strength, muscular endurance, and activity tolerance required for successful completion of functional activities.  - standing AAROM R shoulder wall slide into flexion, scaption, x 20 each. (mild end range discomfort, cuing to allow a bit more of this as long as it resolves when pressure released). - standing dynamic R shoulder IR with towel roll under arm,2x20 with red theraband, 1secH. - standing dynamic R shoulderERwith towel roll under arm,1x20 with red theraband, 2x20 with yellow theraband, focused on improving ROM and form, 1secH. - standing AROM B scaption to available motion with good mechanics, corrected form. Pt reports painful arc, so decreased range to pain free with 1# DBs. 2x20 -Education on diagnosis, prognosis, POC, anatomy and physiology of current condition.  Manual therapy:to reduce pain and tissue tension, improve range of motion,  neuromodulation, in order to promote improved ability to complete functional activities. - PROM R shoulder flexion, abduction/scaption (with slight GHJ caudle glide with movement),  x 10 each with 5 second hold at end range with gentle OP as tolerated - STM to anterior, lateraland posterior R shoulder for improved comfort and decreased tension.  HOME EXERCISE PROGRAM Access Code: [email or web address] URL: [email or web address] Date: 11/15/2019 Prepared by: Rosita Kea  Exercises Supine Shoulder Flexion Extension AAROM with Dowel - 1 x daily - 20 reps - 5 seconds hold Supine Shoulder Circles - 1-2 x daily - 2-3 sets - 20 reps Shoulder Flexion Wall Slide with Towel - 1 x daily - 1 sets - 20 reps - 5 seconds hold Row with band/cable - 1-2 x daily - 30 reps Shoulder Internal Rotation with Resistance - 1 x daily - 1 sets - 20 reps - 3 seconds hold Shoulder External Rotation with Anchored Resistance - 1 x daily - 1 sets - 20 reps - 3 seconds hold Standing Single Arm Bicep Curls with Rotation and Dumbbell - 1 x daily - 2-3 sets - 10 reps Standing Shoulder Scaption - 1 x daily - 2-3 sets -  10 reps Supine Shoulder Flexion Extension Full Range AROM Sidelying Shoulder Abduction Palm Forward    PT Education - 11/25/19 1357    Education Details  Exercise purpose/form. Self management techniques.    Person(s) Educated  Patient    Methods  Explanation;Demonstration;Tactile cues;Verbal cues    Comprehension  Verbalized understanding;Returned demonstration;Verbal cues required;Tactile cues required;Need further instruction       PT Short Term Goals - 09/27/19 1951      PT SHORT TERM GOAL #1   Title  Be independent with initial home exercise program for self-management of symptoms.    Baseline  Initial HEP provided at IE (09/13/2019);    Time  2    Period  Weeks    Status  Achieved    Target Date  09/27/19        PT Long Term Goals - 11/17/19 1432      PT LONG TERM GOAL #1    Title  Be independent with a long-term home exercise program for self-management of symptoms.    Baseline  Initial HEP provided at IE (09/13/2019); currently participating in appropriate HEP (10/18/2019; 11/17/2019);    Time  12    Period  Weeks    Status  Partially Met    Target Date  02/09/20      PT LONG TERM GOAL #2   Title  Demonstrate improved FOTO score to equal or greater than 65 to demonstrate improvement in overall condition and self-reported functional ability.    Baseline  FOTO = 43 (09/13/2019); FOTO = 54 10/18/2019); 60 (11/17/2019);    Time  12    Period  Weeks    Status  Partially Met    Target Date  02/09/20      PT LONG TERM GOAL #3   Title  Have R shoulder AROM equal or greater than L shoulder AROM with no compensations or increase in pain in all planes except intermittent end range discomfort to allow patient to complete valued activities with less difficulty.    Baseline  see objective exam, AROM deferred due to post-op precautions (09/13/2019); able to participate in gentle AROM - see objective (10/18/2019); significant improvement (09/19/2019);    Time  12    Period  Weeks    Status  Partially Met    Target Date  02/09/20      PT LONG TERM GOAL #4   Title  Improve R shoulder strength to 4+/5 for improved ability to allow patient to complete valued functional tasks such as reaching overhead and yardwork with less difficulty.    Baseline  testing deferred due to post-op precautions (09/13/2019); now able to complete AROM in limited range 2/5 (10/18/2019); able to tolerate moderate pressure (11/17/2019);    Time  12    Period  Weeks    Status  Partially Met    Target Date  02/09/20      PT LONG TERM GOAL #5   Title  Complete community, work and/or recreational activities without limitation due to current condition.    Baseline  inability to lift or use R UE for activities such as reaching, lifting, carrying, stabilizing, pushing, pulling, grooming, ADLs, IADLs, yardwork,  farmwork, community and social activity (09/13/2019); able to tie shows, improved gromming, ADLs, slight reaching and light carrying (10/18/2019); improving (11/17/2019);    Time  12    Period  Weeks    Status  Partially Met    Target Date  02/09/20  Plan - 11/25/19 1659    Clinical Impression Statement  Patient tolerated treatment well and continues to make progress towards goals. Is eager to progress shoulder ER but shows too much compensation. Focused part of session on helping pt identify and correct compensation. Patient would benefit from continued management of limiting condition by skilled physical therapist to address remaining impairments and functional limitations to work towards stated goals and return to PLOF or maximal functional independence    Personal Factors and Comorbidities  Age;Behavior Pattern;Comorbidity 3+;Education;Social Background;Time since onset of injury/illness/exacerbation;Past/Current Experience;Fitness    Comorbidities  hx of MI (2000) with stent placement x2 (2017, 2000), skin cancer, carcinoma, melanoma (removed 6 years ago), pre-diabetes, HTN, arthritis, kidney stones, cataract implants, hx of adenomatous colonic polyps. Denies lung problem, seizures, stroke    Examination-Activity Limitations  Bed Mobility;Bathing;Reach Overhead;Self Feeding;Carry;Dressing;Hygiene/Grooming;Sleep;Lift;Toileting    Examination-Participation Restrictions  Driving;Community Activity;Cleaning;Yard Work;Meal Prep;Other    Stability/Clinical Decision Making  Stable/Uncomplicated    Rehab Potential  Good    PT Frequency  2x / week    PT Duration  12 weeks    PT Treatment/Interventions  ADLs/Self Care Home Management;Aquatic Therapy;Electrical Stimulation;Cryotherapy;Moist Heat;DME Instruction;Therapeutic activities;Therapeutic exercise;Balance training;Neuromuscular re-education;Patient/family education;Manual techniques;Compression bandaging;Scar mobilization;Dry  needling;Passive range of motion;Joint Manipulations;Spinal Manipulations    PT Next Visit Plan  Exercises/manual for improved ROM, progressive shoulder girdle strengthening, stability, and functional activities as tolerated.    PT Home Exercise Plan  Medbridge Access Code: [email or web address]    Consulted and Agree with Plan of Care  Patient       Patient will benefit from skilled therapeutic intervention in order to improve the following deficits and impairments:  Decreased knowledge of use of DME, Decreased skin integrity, Increased fascial restricitons, Pain, Increased muscle spasms, Decreased scar mobility, Decreased coordination, Decreased activity tolerance, Decreased endurance, Decreased range of motion, Decreased strength, Hypomobility, Impaired perceived functional ability, Impaired UE functional use, Impaired flexibility, Increased edema, Decreased safety awareness, Decreased knowledge of precautions  Visit Diagnosis: Chronic right shoulder pain  Stiffness of right shoulder, not elsewhere classified  Muscle weakness (generalized)     Problem List Patient Active Problem List   Diagnosis Date Noted  . Complete tear of right rotator cuff 09/07/2019  . Pain in right shoulder 07/27/2019  . Pre-diabetes 12/21/2015  . Abnormal stress test 12/20/2015  . Hypertension   . Coronary artery disease   . Hyperlipemia     Everlean Alstrom. Graylon Good, PT, DPT 11/25/19, 5:00 PM  [facility] [address], [place], [address] Phone: [phone]   Fax:  [phone]  Name: Kevin Stewart MRN: [id number] Date of Birth: October 20, 1940

## 2019-11-30 ENCOUNTER — Encounter: Payer: Self-pay | Admitting: Physical Therapy

## 2019-11-30 ENCOUNTER — Other Ambulatory Visit: Payer: Self-pay

## 2019-11-30 ENCOUNTER — Ambulatory Visit: Payer: Medicare Other | Admitting: Physical Therapy

## 2019-11-30 DIAGNOSIS — M25611 Stiffness of right shoulder, not elsewhere classified: Secondary | ICD-10-CM

## 2019-11-30 DIAGNOSIS — M6281 Muscle weakness (generalized): Secondary | ICD-10-CM

## 2019-11-30 DIAGNOSIS — M25511 Pain in right shoulder: Secondary | ICD-10-CM | POA: Diagnosis not present

## 2019-11-30 DIAGNOSIS — G8929 Other chronic pain: Secondary | ICD-10-CM | POA: Diagnosis not present

## 2019-11-30 NOTE — Therapy (Signed)
[facility] [address] [place], [address] Phone: [phone]   Fax:  [phone]  Physical Therapy Treatment  Patient Details  Name: Kevin Stewart MRN: [id number] Date of Birth: September 04, 1940 Referring Provider (PT): Garald Balding, MD   Encounter Date: 11/30/2019  PT End of Session - 11/30/19 1626    Visit Number  22    Number of Visits  43    Date for PT Re-Evaluation  02/09/20    Authorization Type  [employer] medicare reporting period from 11/25/2019    Authorization Time Period  N/A    Authorization - Visit Number  2    Authorization - Number of Visits  10    Progress Note Due on Visit  30   Next FOTO due = 4/26   PT Start Time  0950    PT Stop Time  1030    PT Time Calculation (min)  40 min    Activity Tolerance  Patient tolerated treatment well;No increased pain    Behavior During Therapy  WFL for tasks assessed/performed       Past Medical History:  Diagnosis Date  . Arthritis    "hands" (12/20/2015)  . Basal cell carcinoma, face   . Coronary artery disease    a. 2003 - light MI per patient; s/p Cypher DES to prox LAD in [facility].  Marland Kitchen Hx of adenomatous colonic polyps   . Hyperlipemia   . Hyperlipidemia   . Hypertension   . Kidney stones 1978   "passed them"  . Melanoma of right side of neck ([place])    removed posterior neck [id number]/312013  . Migraine    "stopped in 2000 when I had my heart attack"  . Myocardial infarction ([place]) 2000  . Pre-diabetes    checks cbg q month  . Rotator cuff tear, right   . Wears dentures    upper-lower partial  . Wears glasses     Past Surgical History:  Procedure Laterality Date  . CARDIAC CATHETERIZATION N/A 12/20/2015   Procedure: Left Heart Cath and Coronary Angiography;  Surgeon: Belva Crome, MD;  Location: [facility];  Service: Cardiovascular;  Laterality: N/A;  . CARDIAC CATHETERIZATION N/A 12/20/2015   Procedure: Coronary Stent  Intervention;  Surgeon: Belva Crome, MD;  Location: [facility];  Service: Cardiovascular;  Laterality: N/A;  . CATARACT EXTRACTION W/ INTRAOCULAR LENS  IMPLANT, BILATERAL Bilateral 2011  . CIRCUMCISION  age 70  . COLONOSCOPY    . CORONARY ANGIOPLASTY WITH STENT PLACEMENT  2000   "1 stent"  . CORONARY ANGIOPLASTY WITH STENT PLACEMENT  12/20/2015   "1 stent"  . CYSTOSCOPY/URETEROSCOPY/HOLMIUM LASER/STENT PLACEMENT Right 08/03/2019   Procedure: CYSTOSCOPY/RETROGRADE/URETEROSCOPY//STENT PLACEMENT;  Surgeon: Ceasar Mons, MD;  Location: [facility];  Service: Urology;  Laterality: Right;  . HEMORRHOID SURGERY     "burned them off"  . MELANOMA EXCISION WITH SENTINEL LYMPH NODE BIOPSY  07/21/2012   Procedure: MELANOMA EXCISION WITH SENTINEL LYMPH NODE BIOPSY;  Surgeon: Rozetta Nunnery, MD;  Location: [facility];  Service: General;  Laterality: N/A;  melanoma excision with sentinel node biopsy   posterior right neck  . TONSILLECTOMY  1961    There were no vitals filed for this visit.  Subjective Assessment - 11/30/19 1618    Subjective  Patient reports his shoulder is a bit sore but not painful. He was helping his wife plant flowers this weekend  including some careful shovel work that did not hurt while he was performing it but he did feel a bit more sore later and the next morning. Has some questions about exercise progressions he can do at home with the R shoulder that he has been doing with the left shoulder.    Pertinent History  Patient is a 79 y.o. male who presents to outpatient physical therapy with a referral for medical diagnosis chonic R shoulder pain, nontraumatic complete tear of R rotator cuff. This patient's chief complaints consist of R shoulder pain, stiffness, weakness, surgical precautions s/p R RTC repair 09/02/2019 leading to the following functional deficits: inability to lift or use R UE for activities such as reaching,  lifting, carrying, stabilizing, pushing, pulling, grooming, ADLs, IADLs, yardwork, farmwork, community and social activity. Relevant past medical history and comorbidities include hx of MI (2000) with stent placement x2 (2017, 2000), skin cancer, carcinoma, melanoma (removed 6 years ago), pre-diabetes, HTN, arthritis, kidney stones, cataract implants, hx of adenomatous colonic polyps. Denies lung problem, seizures, stroke.    Diagnostic tests  Pre-Op MRI report 08/17/2019: "IMPRESSION:1. Supraspinatus tendinosis with full-thickness non-retracted tearof the anterior tendon fibers in the region of the critical zone.2. Subscapularis tendinosis with partial-thickness tearing.3. Intra-articular biceps tendinosis with partial tearing.4. Glenohumeral and acromioclavicular osteoarthritis."    Patient Stated Goals  get better    Currently in Pain?  No/denies       OBJECTIVE  FOTO =60(11/17/2019)  R shoulder PROM(last measured 11/30/19) - flexion: 160* - abduction: 145 (very slight scaption plane) - ER: 63 (at 90 degrees abduction)  R shoulder AAROM(on wall) - flexion 145 - scaption 138   R shoulder AROM:  - Flexion: 143 - FER: T3 - FIR: L3   TREATMENT: R RTC repair 09/02/2019 12 weeks,5days (11/30/2019)  Therapeutic exercise:to centralize symptoms and improve ROM, strength, muscular endurance, and activity tolerance required for successful completion of functional activities. - standing AAROM R shoulder wall slide into flexion, scaption, x 20 each. (mild end range discomfort, cuing toallow a bit more of this as long as it resolves when pressure released).  - standing B scaption toavailable motion with good mechanics, corrected form. Discussed painful arc and addition of light weights. Patient practiced x 10-20 with no weight.  - standing R shoulder abduction (lateral raise) in pain limited range to just under 90 degrees x 7 before pt discontinued and stated it was getting  increasingly sore.  - standing overhead alternating lift offs from high Y position into end range scaption, x 10 each side attempting to stay in pain free range. Instructed to use these at home to start strengthening overhead range pain free. - sidelying R shoulder abduction x 10, now able to complete and more comfortable than in standing.  - standing R shoulder IR AAROM stretch behind back with towel, x10 with 5 second hold. Instructions to add to HEP.  -Education on diagnosis, prognosis, POC, anatomy and physiology of current condition.Possibility of going to 1x a week soon.   Manual therapy:to reduce pain and tissue tension, improve range of motion, neuromodulation, in order to promote improved ability to complete functional activities. - PROM R shoulder flexion, abduction/scaption (with slight GHJ caudle glide with movement),  x 10-20 each with 5 second hold at end range with gentle OP as tolerated. ER and IR x 3 reps.  - STM to anterior, lateraland posterior R shoulder for improved comfort and decreased tension.  HOME EXERCISE PROGRAM Access Code: [email or web address] URL: [email or web address] Date: 11/15/2019  Prepared by: Rosita Kea  Exercises Supine Shoulder Flexion Extension AAROM with Dowel - 1 x daily - 20 reps - 5 seconds hold Supine Shoulder Circles - 1-2 x daily - 2-3 sets - 20 reps Shoulder Flexion Wall Slide with Towel - 1 x daily - 1 sets - 20 reps - 5 seconds hold Row with band/cable - 1-2 x daily - 30 reps Shoulder Internal Rotation with Resistance - 1 x daily - 1 sets - 20 reps - 3 seconds hold Shoulder External Rotation with Anchored Resistance - 1 x daily - 1 sets - 20 reps - 3 seconds hold Standing Single Arm Bicep Curls with Rotation and Dumbbell - 1 x daily - 2-3 sets - 10 reps Standing Shoulder Scaption - 1 x daily - 2-3 sets - 10 reps Supine Shoulder Flexion Extension Full Range AROM Sidelying Shoulder Abduction Palm Forward    PT Education -  11/30/19 1626    Education Details  Exercise purpose/form. Self management techniques. updated HEP    Person(s) Educated  Patient    Methods  Explanation;Demonstration;Tactile cues;Verbal cues;Handout    Comprehension  Verbalized understanding;Returned demonstration;Verbal cues required;Tactile cues required;Need further instruction       PT Short Term Goals - 09/27/19 1951      PT SHORT TERM GOAL #1   Title  Be independent with initial home exercise program for self-management of symptoms.    Baseline  Initial HEP provided at IE (09/13/2019);    Time  2    Period  Weeks    Status  Achieved    Target Date  09/27/19        PT Long Term Goals - 11/17/19 1432      PT LONG TERM GOAL #1   Title  Be independent with a long-term home exercise program for self-management of symptoms.    Baseline  Initial HEP provided at IE (09/13/2019); currently participating in appropriate HEP (10/18/2019; 11/17/2019);    Time  12    Period  Weeks    Status  Partially Met    Target Date  02/09/20      PT LONG TERM GOAL #2   Title  Demonstrate improved FOTO score to equal or greater than 65 to demonstrate improvement in overall condition and self-reported functional ability.    Baseline  FOTO = 43 (09/13/2019); FOTO = 54 10/18/2019); 60 (11/17/2019);    Time  12    Period  Weeks    Status  Partially Met    Target Date  02/09/20      PT LONG TERM GOAL #3   Title  Have R shoulder AROM equal or greater than L shoulder AROM with no compensations or increase in pain in all planes except intermittent end range discomfort to allow patient to complete valued activities with less difficulty.    Baseline  see objective exam, AROM deferred due to post-op precautions (09/13/2019); able to participate in gentle AROM - see objective (10/18/2019); significant improvement (09/19/2019);    Time  12    Period  Weeks    Status  Partially Met    Target Date  02/09/20      PT LONG TERM GOAL #4   Title  Improve R shoulder  strength to 4+/5 for improved ability to allow patient to complete valued functional tasks such as reaching overhead and yardwork with less difficulty.    Baseline  testing deferred due to post-op precautions (09/13/2019); now able to complete AROM in limited range 2/5 (  10/18/2019); able to tolerate moderate pressure (11/17/2019);    Time  12    Period  Weeks    Status  Partially Met    Target Date  02/09/20      PT LONG TERM GOAL #5   Title  Complete community, work and/or recreational activities without limitation due to current condition.    Baseline  inability to lift or use R UE for activities such as reaching, lifting, carrying, stabilizing, pushing, pulling, grooming, ADLs, IADLs, yardwork, farmwork, community and social activity (09/13/2019); able to tie shows, improved gromming, ADLs, slight reaching and light carrying (10/18/2019); improving (11/17/2019);    Time  12    Period  Weeks    Status  Partially Met    Target Date  02/09/20            Plan - 11/30/19 1625    Clinical Impression Statement  Patient tolerated treatment well and continues to make good progress towards goals. He continues to demonstrate increasing ROM and strength although he still has deficits in both as well as pain and has not yet returned to PLOF. He demonstrates good motivation and good ability to complete exercises properly with guidance and intermittent cuing. Patient would benefit from continued management of limiting condition by skilled physical therapist to address remaining impairments and functional limitations to work towards stated goals and return to PLOF or maximal functional independence.    Personal Factors and Comorbidities  Age;Behavior Pattern;Comorbidity 3+;Education;Social Background;Time since onset of injury/illness/exacerbation;Past/Current Experience;Fitness    Comorbidities  hx of MI (2000) with stent placement x2 (2017, 2000), skin cancer, carcinoma, melanoma (removed 6 years ago),  pre-diabetes, HTN, arthritis, kidney stones, cataract implants, hx of adenomatous colonic polyps. Denies lung problem, seizures, stroke    Examination-Activity Limitations  Bed Mobility;Bathing;Reach Overhead;Self Feeding;Carry;Dressing;Hygiene/Grooming;Sleep;Lift;Toileting    Examination-Participation Restrictions  Driving;Community Activity;Cleaning;Yard Work;Meal Prep;Other    Stability/Clinical Decision Making  Stable/Uncomplicated    Rehab Potential  Good    PT Frequency  2x / week    PT Duration  12 weeks    PT Treatment/Interventions  ADLs/Self Care Home Management;Aquatic Therapy;Electrical Stimulation;Cryotherapy;Moist Heat;DME Instruction;Therapeutic activities;Therapeutic exercise;Balance training;Neuromuscular re-education;Patient/family education;Manual techniques;Compression bandaging;Scar mobilization;Dry needling;Passive range of motion;Joint Manipulations;Spinal Manipulations    PT Next Visit Plan  Exercises/manual for improved ROM, progressive shoulder girdle strengthening, stability, and functional activities as tolerated.    PT Home Exercise Plan  Medbridge Access Code: [email or web address]    Consulted and Agree with Plan of Care  Patient       Patient will benefit from skilled therapeutic intervention in order to improve the following deficits and impairments:  Decreased knowledge of use of DME, Decreased skin integrity, Increased fascial restricitons, Pain, Increased muscle spasms, Decreased scar mobility, Decreased coordination, Decreased activity tolerance, Decreased endurance, Decreased range of motion, Decreased strength, Hypomobility, Impaired perceived functional ability, Impaired UE functional use, Impaired flexibility, Increased edema, Decreased safety awareness, Decreased knowledge of precautions  Visit Diagnosis: Chronic right shoulder pain  Stiffness of right shoulder, not elsewhere classified  Muscle weakness (generalized)     Problem List Patient Active Problem List    Diagnosis Date Noted  . Complete tear of right rotator cuff 09/07/2019  . Pain in right shoulder 07/27/2019  . Pre-diabetes 12/21/2015  . Abnormal stress test 12/20/2015  . Hypertension   . Coronary artery disease   . Hyperlipemia     Everlean Alstrom. Graylon Good, PT, DPT 11/30/19, 4:32 PM  [facility] [address]  [place], [place], [address] Phone: [phone]   Fax:  [phone]  Name: LLOYDE LUDLAM MRN: [id number] Date of Birth: June 25, 1941

## 2019-12-02 ENCOUNTER — Other Ambulatory Visit: Payer: Self-pay

## 2019-12-02 ENCOUNTER — Ambulatory Visit: Payer: Medicare Other | Admitting: Physical Therapy

## 2019-12-02 DIAGNOSIS — M25511 Pain in right shoulder: Secondary | ICD-10-CM

## 2019-12-02 DIAGNOSIS — M25611 Stiffness of right shoulder, not elsewhere classified: Secondary | ICD-10-CM

## 2019-12-02 DIAGNOSIS — G8929 Other chronic pain: Secondary | ICD-10-CM | POA: Diagnosis not present

## 2019-12-02 DIAGNOSIS — M6281 Muscle weakness (generalized): Secondary | ICD-10-CM

## 2019-12-02 NOTE — Therapy (Signed)
[facility] [address], [place], [address] Phone: [phone]   Fax:  [phone]  Physical Therapy Treatment  Patient Details  Name: Kevin Stewart MRN: [id number] Date of Birth: [birth year] Referring Provider (PT): Garald Balding, MD   Encounter Date: 12/02/2019  PT End of Session - 12/02/19 1058    Visit Number  23    Number of Visits  43    Date for PT Re-Evaluation  02/09/20    Authorization Type  [employer] medicare reporting period from 11/25/2019    Authorization Time Period  N/A    Authorization - Visit Number  3    Authorization - Number of Visits  10    Progress Note Due on Visit  30   Next FOTO due = 4/26   PT Start Time  0950    PT Stop Time  1030    PT Time Calculation (min)  40 min    Activity Tolerance  Patient tolerated treatment well;No increased pain    Behavior During Therapy  WFL for tasks assessed/performed       Past Medical History:  Diagnosis Date  . Arthritis    "hands" (12/20/2015)  . Basal cell carcinoma, face   . Coronary artery disease    a. 2003 - light MI per patient; s/p Cypher DES to prox LAD in [facility].  Marland Kitchen Hx of adenomatous colonic polyps   . Hyperlipemia   . Hyperlipidemia   . Hypertension   . Kidney stones 1978   "passed them"  . Melanoma of right side of neck ([place])    removed posterior neck [id number]/312013  . Migraine    "stopped in 2000 when I had my heart attack"  . Myocardial infarction ([place]) 2000  . Pre-diabetes    checks cbg q month  . Rotator cuff tear, right   . Wears dentures    upper-lower partial  . Wears glasses     Past Surgical History:  Procedure Laterality Date  . CARDIAC CATHETERIZATION N/A 12/20/2015   Procedure: Left Heart Cath and Coronary Angiography;  Surgeon: Belva Crome, MD;  Location: [facility];  Service: Cardiovascular;  Laterality: N/A;  . CARDIAC CATHETERIZATION N/A 12/20/2015   Procedure: Coronary Stent  Intervention;  Surgeon: Belva Crome, MD;  Location: [facility];  Service: Cardiovascular;  Laterality: N/A;  . CATARACT EXTRACTION W/ INTRAOCULAR LENS  IMPLANT, BILATERAL Bilateral 2011  . CIRCUMCISION  age 77  . COLONOSCOPY    . CORONARY ANGIOPLASTY WITH STENT PLACEMENT  2000   "1 stent"  . CORONARY ANGIOPLASTY WITH STENT PLACEMENT  12/20/2015   "1 stent"  . CYSTOSCOPY/URETEROSCOPY/HOLMIUM LASER/STENT PLACEMENT Right 08/03/2019   Procedure: CYSTOSCOPY/RETROGRADE/URETEROSCOPY//STENT PLACEMENT;  Surgeon: Ceasar Mons, MD;  Location: [facility];  Service: Urology;  Laterality: Right;  . HEMORRHOID SURGERY     "burned them off"  . MELANOMA EXCISION WITH SENTINEL LYMPH NODE BIOPSY  07/21/2012   Procedure: MELANOMA EXCISION WITH SENTINEL LYMPH NODE BIOPSY;  Surgeon: Rozetta Nunnery, MD;  Location: [facility];  Service: General;  Laterality: N/A;  melanoma excision with sentinel node biopsy   posterior right neck  . TONSILLECTOMY  1961    There were no vitals filed for this visit.  Subjective Assessment - 12/02/19 1059    Subjective  Patient reports some incrased soreness after he thinks he stretched into IR a little too hard and may have done  too many scaption and abduction exercises. States he is just a bit sore at rest but that pain goes up to 6/10 when he raises his R arm overhead.    Pertinent History  Patient is a 79 y.o. male who presents to outpatient physical therapy with a referral for medical diagnosis chonic R shoulder pain, nontraumatic complete tear of R rotator cuff. This patient's chief complaints consist of R shoulder pain, stiffness, weakness, surgical precautions s/p R RTC repair 09/02/2019 leading to the following functional deficits: inability to lift or use R UE for activities such as reaching, lifting, carrying, stabilizing, pushing, pulling, grooming, ADLs, IADLs, yardwork, farmwork, community and social activity. Relevant  past medical history and comorbidities include hx of MI (2000) with stent placement x2 (2017, 2000), skin cancer, carcinoma, melanoma (removed 6 years ago), pre-diabetes, HTN, arthritis, kidney stones, cataract implants, hx of adenomatous colonic polyps. Denies lung problem, seizures, stroke.    Diagnostic tests  Pre-Op MRI report 08/17/2019: "IMPRESSION:1. Supraspinatus tendinosis with full-thickness non-retracted tearof the anterior tendon fibers in the region of the critical zone.2. Subscapularis tendinosis with partial-thickness tearing.3. Intra-articular biceps tendinosis with partial tearing.4. Glenohumeral and acromioclavicular osteoarthritis."    Patient Stated Goals  get better    Currently in Pain?  Yes    Pain Score  --   "sore" at rest and up to 6/10 overhead        OBJECTIVE  FOTO =60(11/17/2019)  R shoulder PROM(last measured5/13/21) - flexion: 160*  R shoulder AAROM(on wall) - flexion 136 - scaption 142  R shoulder AROM:  - FER: T1 - FIR: L3   TREATMENT: R RTC repair 09/02/2019 13 weeks,  (12/02/2019)  Therapeutic exercise:to centralize symptoms and improve ROM, strength, muscular endurance, and activity tolerance required for successful completion of functional activities. - standing AAROM R shoulder wall slide into flexion, scaption, x 20 each. - standing dynamic R shoulder IR with towel roll under arm, red theraband2x20, with green theraband 1x18, 1secH. - standing dynamic R shoulderERwith towel roll under arm,2x20 with red theraband,focused on improving ROM and form,1secH. - R single arm row x 20 with 10# cable. Also attempted 15# cable for 5 reps but was uncomfortable so discontinued.  - standing R biceps curl, 2x15 with 6# DB. - education about discontinuing scaption abduction exercises and IR stretch until next session due to elevated soreness and pain with those activities. Discussed updates in resistance and refinement of technique for  other exercises.    Manual therapy:to reduce pain and tissue tension, improve range of motion, neuromodulation, in order to promote improved ability to complete functional activities. - PROM R shoulder flexion, abduction/scaption (with slight GHJ caudle glide with movement), x 10-20 each with 5 second hold at end range with gentle OP as tolerated. ER and IR x 5-10 reps.  - STM to anterior, lateraland posterior R shoulder for improved comfort and decreased tension.  HOME EXERCISE PROGRAM Access Code: [email or web address] URL: [email or web address] Date: 11/15/2019 Prepared by: Rosita Kea  Exercises Supine Shoulder Flexion Extension AAROM with Dowel - 1 x daily - 20 reps - 5 seconds hold Supine Shoulder Circles - 1-2 x daily - 2-3 sets - 20 reps Shoulder Flexion Wall Slide with Towel - 1 x daily - 1 sets - 20 reps - 5 seconds hold Row with band/cable - 1-2 x daily - 30 reps Shoulder Internal Rotation with Resistance - 1 x daily - 1 sets - 20 reps - 3 seconds hold Shoulder External Rotation with Anchored Resistance - 1  x daily - 1 sets - 20 reps - 3 seconds hold Standing Single Arm Bicep Curls with Rotation and Dumbbell - 1 x daily - 2-3 sets - 10 reps Standing Shoulder Scaption - 1 x daily - 2-3 sets - 10 reps Supine Shoulder Flexion Extension Full Range AROM Sidelying Shoulder Abduction Palm Forward    PT Education - 12/02/19 1058    Education Details  Exercise purpose/form. Self management techniques.  HEP    Person(s) Educated  Patient    Methods  Explanation;Demonstration;Tactile cues;Verbal cues    Comprehension  Verbalized understanding;Returned demonstration;Verbal cues required;Tactile cues required;Need further instruction       PT Short Term Goals - 09/27/19 1951      PT SHORT TERM GOAL #1   Title  Be independent with initial home exercise program for self-management of symptoms.    Baseline  Initial HEP provided at IE (09/13/2019);    Time  2    Period   Weeks    Status  Achieved    Target Date  09/27/19        PT Long Term Goals - 11/17/19 1432      PT LONG TERM GOAL #1   Title  Be independent with a long-term home exercise program for self-management of symptoms.    Baseline  Initial HEP provided at IE (09/13/2019); currently participating in appropriate HEP (10/18/2019; 11/17/2019);    Time  12    Period  Weeks    Status  Partially Met    Target Date  02/09/20      PT LONG TERM GOAL #2   Title  Demonstrate improved FOTO score to equal or greater than 65 to demonstrate improvement in overall condition and self-reported functional ability.    Baseline  FOTO = 43 (09/13/2019); FOTO = 54 10/18/2019); 60 (11/17/2019);    Time  12    Period  Weeks    Status  Partially Met    Target Date  02/09/20      PT LONG TERM GOAL #3   Title  Have R shoulder AROM equal or greater than L shoulder AROM with no compensations or increase in pain in all planes except intermittent end range discomfort to allow patient to complete valued activities with less difficulty.    Baseline  see objective exam, AROM deferred due to post-op precautions (09/13/2019); able to participate in gentle AROM - see objective (10/18/2019); significant improvement (09/19/2019);    Time  12    Period  Weeks    Status  Partially Met    Target Date  02/09/20      PT LONG TERM GOAL #4   Title  Improve R shoulder strength to 4+/5 for improved ability to allow patient to complete valued functional tasks such as reaching overhead and yardwork with less difficulty.    Baseline  testing deferred due to post-op precautions (09/13/2019); now able to complete AROM in limited range 2/5 (10/18/2019); able to tolerate moderate pressure (11/17/2019);    Time  12    Period  Weeks    Status  Partially Met    Target Date  02/09/20      PT LONG TERM GOAL #5   Title  Complete community, work and/or recreational activities without limitation due to current condition.    Baseline  inability to lift  or use R UE for activities such as reaching, lifting, carrying, stabilizing, pushing, pulling, grooming, ADLs, IADLs, yardwork, farmwork, community and social activity (09/13/2019); able to tie  shows, improved gromming, ADLs, slight reaching and light carrying (10/18/2019); improving (11/17/2019);    Time  12    Period  Weeks    Status  Partially Met    Target Date  02/09/20            Plan - 12/02/19 1057    Clinical Impression Statement  Patient with elevated soreness and slightly reduced AROM today that appears to be related to being a bit overzealous with some of his new exercises at home. Educated and he agreed to discontinue those until next session to allow soreness to calm down with the plan to reintroduce them with less vigor. Reported relief from [facility] and tolerated manual stretching and selected exercises well with improved form and ability to increase resistance in some with cuing. Patient does not feel ready to go to one time a week in the clinic yet, so plan to continue at a frequency of 2x per week until patient feels more confident and shows improved ability to complete HEP without provoking unwanted increased symptoms.  Patient would benefit from continued management of limiting condition by skilled physical therapist to address remaining impairments and functional limitations to work towards stated goals and return to PLOF or maximal functional independence.    Personal Factors and Comorbidities  Age;Behavior Pattern;Comorbidity 3+;Education;Social Background;Time since onset of injury/illness/exacerbation;Past/Current Experience;Fitness    Comorbidities  hx of MI (2000) with stent placement x2 (2017, 2000), skin cancer, carcinoma, melanoma (removed 6 years ago), pre-diabetes, HTN, arthritis, kidney stones, cataract implants, hx of adenomatous colonic polyps. Denies lung problem, seizures, stroke    Examination-Activity Limitations  Bed Mobility;Bathing;Reach Overhead;Self  Feeding;Carry;Dressing;Hygiene/Grooming;Sleep;Lift;Toileting    Examination-Participation Restrictions  Driving;Community Activity;Cleaning;Yard Work;Meal Prep;Other    Stability/Clinical Decision Making  Stable/Uncomplicated    Rehab Potential  Good    PT Frequency  2x / week    PT Duration  12 weeks    PT Treatment/Interventions  ADLs/Self Care Home Management;Aquatic Therapy;Electrical Stimulation;Cryotherapy;Moist Heat;DME Instruction;Therapeutic activities;Therapeutic exercise;Balance training;Neuromuscular re-education;Patient/family education;Manual techniques;Compression bandaging;Scar mobilization;Dry needling;Passive range of motion;Joint Manipulations;Spinal Manipulations    PT Next Visit Plan  Exercises/manual for improved ROM, progressive shoulder girdle strengthening, stability, and functional activities as tolerated.    PT Home Exercise Plan  Medbridge Access Code: [email or web address]    Consulted and Agree with Plan of Care  Patient       Patient will benefit from skilled therapeutic intervention in order to improve the following deficits and impairments:  Decreased knowledge of use of DME, Decreased skin integrity, Increased fascial restricitons, Pain, Increased muscle spasms, Decreased scar mobility, Decreased coordination, Decreased activity tolerance, Decreased endurance, Decreased range of motion, Decreased strength, Hypomobility, Impaired perceived functional ability, Impaired UE functional use, Impaired flexibility, Increased edema, Decreased safety awareness, Decreased knowledge of precautions  Visit Diagnosis: Chronic right shoulder pain  Stiffness of right shoulder, not elsewhere classified  Muscle weakness (generalized)     Problem List Patient Active Problem List   Diagnosis Date Noted  . Complete tear of right rotator cuff 09/07/2019  . Pain in right shoulder 07/27/2019  . Pre-diabetes 12/21/2015  . Abnormal stress test 12/20/2015  . Hypertension   . Coronary artery  disease   . Hyperlipemia     Everlean Alstrom. Graylon Good, PT, DPT 12/02/19, 11:00 AM  [facility] [address], [place], [address] Phone: [phone]   Fax:  [phone]  Name: Kevin Stewart MRN: [id number] Date of Birth: [birth year]

## 2019-12-07 ENCOUNTER — Encounter: Payer: Self-pay | Admitting: Physical Therapy

## 2019-12-07 ENCOUNTER — Other Ambulatory Visit: Payer: Self-pay

## 2019-12-07 ENCOUNTER — Ambulatory Visit: Payer: Medicare Other | Admitting: Physical Therapy

## 2019-12-07 DIAGNOSIS — G8929 Other chronic pain: Secondary | ICD-10-CM

## 2019-12-07 DIAGNOSIS — M6281 Muscle weakness (generalized): Secondary | ICD-10-CM | POA: Diagnosis not present

## 2019-12-07 DIAGNOSIS — M25611 Stiffness of right shoulder, not elsewhere classified: Secondary | ICD-10-CM | POA: Diagnosis not present

## 2019-12-07 DIAGNOSIS — M25511 Pain in right shoulder: Secondary | ICD-10-CM | POA: Diagnosis not present

## 2019-12-07 NOTE — Therapy (Signed)
[facility] [address], [place], [address] Phone: [phone]   Fax:  [phone]  Physical Therapy Treatment  Patient Details  Name: Kevin Stewart MRN: [id number] Date of Birth: [birth year] Referring Provider (PT): Garald Balding, MD   Encounter Date: 12/07/2019  PT End of Session - 12/07/19 1458    Visit Number  24    Number of Visits  43    Date for PT Re-Evaluation  02/09/20    Authorization Type  [employer] medicare reporting period from 11/25/2019    Authorization Time Period  N/A    Authorization - Visit Number  4    Authorization - Number of Visits  10    Progress Note Due on Visit  30   Next FOTO due = 4/26   PT Start Time  1435    PT Stop Time  1515    PT Time Calculation (min)  40 min    Activity Tolerance  Patient tolerated treatment well;No increased pain    Behavior During Therapy  WFL for tasks assessed/performed       Past Medical History:  Diagnosis Date  . Arthritis    "hands" (12/20/2015)  . Basal cell carcinoma, face   . Coronary artery disease    a. 2003 - light MI per patient; s/p Cypher DES to prox LAD in [facility].  Marland Kitchen Hx of adenomatous colonic polyps   . Hyperlipemia   . Hyperlipidemia   . Hypertension   . Kidney stones 1978   "passed them"  . Melanoma of right side of neck ([place])    removed posterior neck 12/312013  . Migraine    "stopped in 2000 when I had my heart attack"  . Myocardial infarction ([place]) 2000  . Pre-diabetes    checks cbg q month  . Rotator cuff tear, right   . Wears dentures    upper-lower partial  . Wears glasses     Past Surgical History:  Procedure Laterality Date  . CARDIAC CATHETERIZATION N/A 12/20/2015   Procedure: Left Heart Cath and Coronary Angiography;  Surgeon: Belva Crome, MD;  Location: [facility];  Service: Cardiovascular;  Laterality: N/A;  . CARDIAC CATHETERIZATION N/A 12/20/2015   Procedure: Coronary Stent  Intervention;  Surgeon: Belva Crome, MD;  Location: [facility];  Service: Cardiovascular;  Laterality: N/A;  . CATARACT EXTRACTION W/ INTRAOCULAR LENS  IMPLANT, BILATERAL Bilateral 2011  . CIRCUMCISION  age 52  . COLONOSCOPY    . CORONARY ANGIOPLASTY WITH STENT PLACEMENT  2000   "1 stent"  . CORONARY ANGIOPLASTY WITH STENT PLACEMENT  12/20/2015   "1 stent"  . CYSTOSCOPY/URETEROSCOPY/HOLMIUM LASER/STENT PLACEMENT Right 08/03/2019   Procedure: CYSTOSCOPY/RETROGRADE/URETEROSCOPY//STENT PLACEMENT;  Surgeon: Ceasar Mons, MD;  Location: [facility];  Service: Urology;  Laterality: Right;  . HEMORRHOID SURGERY     "burned them off"  . MELANOMA EXCISION WITH SENTINEL LYMPH NODE BIOPSY  07/21/2012   Procedure: MELANOMA EXCISION WITH SENTINEL LYMPH NODE BIOPSY;  Surgeon: Rozetta Nunnery, MD;  Location: [facility];  Service: General;  Laterality: N/A;  melanoma excision with sentinel node biopsy   posterior right neck  . TONSILLECTOMY  1961    There were no vitals filed for this visit.  Subjective Assessment - 12/07/19 1454    Subjective  Patient reports continued soreness in the morning but no pain upon arrival. Has backed off on scaption exercise but added  it back in as tolerated. Reports he did some light hoeing beans and it was a bit sore but no lasting pain.    Pertinent History  Patient is a 79 y.o. male who presents to outpatient physical therapy with a referral for medical diagnosis chonic R shoulder pain, nontraumatic complete tear of R rotator cuff. This patient's chief complaints consist of R shoulder pain, stiffness, weakness, surgical precautions s/p R RTC repair 09/02/2019 leading to the following functional deficits: inability to lift or use R UE for activities such as reaching, lifting, carrying, stabilizing, pushing, pulling, grooming, ADLs, IADLs, yardwork, farmwork, community and social activity. Relevant past medical history and  comorbidities include hx of MI (2000) with stent placement x2 (2017, 2000), skin cancer, carcinoma, melanoma (removed 6 years ago), pre-diabetes, HTN, arthritis, kidney stones, cataract implants, hx of adenomatous colonic polyps. Denies lung problem, seizures, stroke.    Diagnostic tests  Pre-Op MRI report 08/17/2019: "IMPRESSION:1. Supraspinatus tendinosis with full-thickness non-retracted tearof the anterior tendon fibers in the region of the critical zone.2. Subscapularis tendinosis with partial-thickness tearing.3. Intra-articular biceps tendinosis with partial tearing.4. Glenohumeral and acromioclavicular osteoarthritis."    Patient Stated Goals  get better    Currently in Pain?  No/denies       OBJECTIVE  FOTO =60(11/17/2019)  R shoulder PROM(last measured5/13/21) - flexion: 160*  R shoulder AAROM(on wall) - flexion 140 - scaption 140  R shoulder AROM: - FER: T2  - FIR: L3   TREATMENT: R RTC repair 09/02/2019 13weeks, 5 days  (12/07/2019)  Therapeutic exercise:to centralize symptoms and improve ROM, strength, muscular endurance, and activity tolerance required for successful completion of functional activities. - standing AAROM R shoulder wall slide into flexion, scaption, x 20 each.  Circuit:  - standing scaption to comfortable height, no weight. 3x10. Slight discomfort that gets no worse.  - standing dynamic R shoulder IR with towel roll under arm, green theraband3x20. 1secH. - standing dynamic R shoulderERwith towel roll under arm,3x20 withredtheraband,focused on improving ROM, 1secH.  - education about discontinuing scaption abduction exercises and IR stretch until next session due to elevated soreness and pain with those activities. Discussed updates in resistance and refinement of technique for other exercises.    Manual therapy:to reduce pain and tissue tension, improve range of motion, neuromodulation, in order to promote improved ability  to complete functional activities. - PROM R shoulder flexion, abduction/scaption (with slight GHJ caudle glide with movement), x 10 each with 5 second hold at end range with gentle OP as tolerated. ER and IR x 10 reps. - STM to anterior, lateraland posterior R shoulder for improved comfort and decreased tension. - supine joint L GHJ mob grade III-IV caudal in neutral and 90 degrees abduction, AP in neutral.   HOME EXERCISE PROGRAM Access Code: [email or web address] URL: [email or web address] Date: 11/15/2019 Prepared by: Rosita Kea  Exercises Supine Shoulder Flexion Extension AAROM with Dowel - 1 x daily - 20 reps - 5 seconds hold Supine Shoulder Circles - 1-2 x daily - 2-3 sets - 20 reps Shoulder Flexion Wall Slide with Towel - 1 x daily - 1 sets - 20 reps - 5 seconds hold Row with band/cable - 1-2 x daily - 30 reps Shoulder Internal Rotation with Resistance - 1 x daily - 1 sets - 20 reps - 3 seconds hold Shoulder External Rotation with Anchored Resistance - 1 x daily - 1 sets - 20 reps - 3 seconds hold Standing Single Arm Bicep Curls with Rotation and Dumbbell - 1 x daily -  2-3 sets - 10 reps Standing Shoulder Scaption - 1 x daily - 2-3 sets - 10 reps Supine Shoulder Flexion Extension Full Range AROM Sidelying Shoulder Abduction Palm Forward    PT Education - 12/07/19 1457    Education Details  Exercise purpose/form. Self management techniques.    Person(s) Educated  Patient    Methods  Explanation;Demonstration;Tactile cues;Verbal cues    Comprehension  Verbalized understanding;Returned demonstration;Verbal cues required;Tactile cues required;Need further instruction       PT Short Term Goals - 09/27/19 1951      PT SHORT TERM GOAL #1   Title  Be independent with initial home exercise program for self-management of symptoms.    Baseline  Initial HEP provided at IE (09/13/2019);    Time  2    Period  Weeks    Status  Achieved    Target Date  09/27/19        PT  Long Term Goals - 11/17/19 1432      PT LONG TERM GOAL #1   Title  Be independent with a long-term home exercise program for self-management of symptoms.    Baseline  Initial HEP provided at IE (09/13/2019); currently participating in appropriate HEP (10/18/2019; 11/17/2019);    Time  12    Period  Weeks    Status  Partially Met    Target Date  02/09/20      PT LONG TERM GOAL #2   Title  Demonstrate improved FOTO score to equal or greater than 65 to demonstrate improvement in overall condition and self-reported functional ability.    Baseline  FOTO = 43 (09/13/2019); FOTO = 54 10/18/2019); 60 (11/17/2019);    Time  12    Period  Weeks    Status  Partially Met    Target Date  02/09/20      PT LONG TERM GOAL #3   Title  Have R shoulder AROM equal or greater than L shoulder AROM with no compensations or increase in pain in all planes except intermittent end range discomfort to allow patient to complete valued activities with less difficulty.    Baseline  see objective exam, AROM deferred due to post-op precautions (09/13/2019); able to participate in gentle AROM - see objective (10/18/2019); significant improvement (09/19/2019);    Time  12    Period  Weeks    Status  Partially Met    Target Date  02/09/20      PT LONG TERM GOAL #4   Title  Improve R shoulder strength to 4+/5 for improved ability to allow patient to complete valued functional tasks such as reaching overhead and yardwork with less difficulty.    Baseline  testing deferred due to post-op precautions (09/13/2019); now able to complete AROM in limited range 2/5 (10/18/2019); able to tolerate moderate pressure (11/17/2019);    Time  12    Period  Weeks    Status  Partially Met    Target Date  02/09/20      PT LONG TERM GOAL #5   Title  Complete community, work and/or recreational activities without limitation due to current condition.    Baseline  inability to lift or use R UE for activities such as reaching, lifting, carrying,  stabilizing, pushing, pulling, grooming, ADLs, IADLs, yardwork, farmwork, community and social activity (09/13/2019); able to tie shows, improved gromming, ADLs, slight reaching and light carrying (10/18/2019); improving (11/17/2019);    Time  12    Period  Weeks    Status  Partially Met    Target Date  02/09/20            Plan - 12/07/19 1521    Clinical Impression Statement  Patient tolerated treatment well overall and continued with RTC and shoulder girdle strengthening as tolerated. Pt with slight discomfort to start with and feels better as he continues. Cuing to improve mechanics and ROM. Improved AROM this session. Patient would benefit from continued management of limiting condition by skilled physical therapist to address remaining impairments and functional limitations to work towards stated goals and return to PLOF or maximal functional independence.    Personal Factors and Comorbidities  Age;Behavior Pattern;Comorbidity 3+;Education;Social Background;Time since onset of injury/illness/exacerbation;Past/Current Experience;Fitness    Comorbidities  hx of MI (2000) with stent placement x2 (2017, 2000), skin cancer, carcinoma, melanoma (removed 6 years ago), pre-diabetes, HTN, arthritis, kidney stones, cataract implants, hx of adenomatous colonic polyps. Denies lung problem, seizures, stroke    Examination-Activity Limitations  Bed Mobility;Bathing;Reach Overhead;Self Feeding;Carry;Dressing;Hygiene/Grooming;Sleep;Lift;Toileting    Examination-Participation Restrictions  Driving;Community Activity;Cleaning;Yard Work;Meal Prep;Other    Stability/Clinical Decision Making  Stable/Uncomplicated    Rehab Potential  Good    PT Frequency  2x / week    PT Duration  12 weeks    PT Treatment/Interventions  ADLs/Self Care Home Management;Aquatic Therapy;Electrical Stimulation;Cryotherapy;Moist Heat;DME Instruction;Therapeutic activities;Therapeutic exercise;Balance training;Neuromuscular  re-education;Patient/family education;Manual techniques;Compression bandaging;Scar mobilization;Dry needling;Passive range of motion;Joint Manipulations;Spinal Manipulations    PT Next Visit Plan  Exercises/manual for improved ROM, progressive shoulder girdle strengthening, stability, and functional activities as tolerated.    PT Home Exercise Plan  Medbridge Access Code: [email or web address]    Consulted and Agree with Plan of Care  Patient       Patient will benefit from skilled therapeutic intervention in order to improve the following deficits and impairments:  Decreased knowledge of use of DME, Decreased skin integrity, Increased fascial restricitons, Pain, Increased muscle spasms, Decreased scar mobility, Decreased coordination, Decreased activity tolerance, Decreased endurance, Decreased range of motion, Decreased strength, Hypomobility, Impaired perceived functional ability, Impaired UE functional use, Impaired flexibility, Increased edema, Decreased safety awareness, Decreased knowledge of precautions  Visit Diagnosis: Chronic right shoulder pain  Stiffness of right shoulder, not elsewhere classified  Muscle weakness (generalized)     Problem List Patient Active Problem List   Diagnosis Date Noted  . Complete tear of right rotator cuff 09/07/2019  . Pain in right shoulder 07/27/2019  . Pre-diabetes 12/21/2015  . Abnormal stress test 12/20/2015  . Hypertension   . Coronary artery disease   . Hyperlipemia     Everlean Alstrom. Graylon Good, PT, DPT 12/07/19, 3:21 PM  [facility] [address] [place], [address] Phone: [phone]   Fax:  [phone]  Name: Kevin Stewart MRN: [id number] Date of Birth: 1940/08/16

## 2019-12-09 ENCOUNTER — Ambulatory Visit: Payer: Medicare Other | Admitting: Physical Therapy

## 2019-12-09 ENCOUNTER — Other Ambulatory Visit: Payer: Self-pay

## 2019-12-09 ENCOUNTER — Encounter: Payer: Self-pay | Admitting: Physical Therapy

## 2019-12-09 DIAGNOSIS — G8929 Other chronic pain: Secondary | ICD-10-CM | POA: Diagnosis not present

## 2019-12-09 DIAGNOSIS — M25511 Pain in right shoulder: Secondary | ICD-10-CM | POA: Diagnosis not present

## 2019-12-09 DIAGNOSIS — M6281 Muscle weakness (generalized): Secondary | ICD-10-CM

## 2019-12-09 DIAGNOSIS — M25611 Stiffness of right shoulder, not elsewhere classified: Secondary | ICD-10-CM | POA: Diagnosis not present

## 2019-12-09 NOTE — Therapy (Signed)
[facility] [address], [place], [address] Phone: [phone]   Fax:  [phone]  Physical Therapy Treatment  Patient Details  Name: Kevin Stewart MRN: [id number] Date of Birth: 06-23-1941 Referring Provider (PT): Garald Balding, MD   Encounter Date: 12/09/2019  PT End of Session - 12/09/19 0956    Visit Number  25    Number of Visits  43    Date for PT Re-Evaluation  02/09/20    Authorization Type  [employer] medicare reporting period from 11/25/2019    Authorization Time Period  N/A    Authorization - Visit Number  5    Authorization - Number of Visits  10    Progress Note Due on Visit  30   Next FOTO due = 4/26   PT Start Time  0950    PT Stop Time  1030    PT Time Calculation (min)  40 min    Activity Tolerance  Patient tolerated treatment well;No increased pain    Behavior During Therapy  WFL for tasks assessed/performed       Past Medical History:  Diagnosis Date  . Arthritis    "hands" (12/20/2015)  . Basal cell carcinoma, face   . Coronary artery disease    a. 2003 - light MI per patient; s/p Cypher DES to prox LAD in [facility].  Marland Kitchen Hx of adenomatous colonic polyps   . Hyperlipemia   . Hyperlipidemia   . Hypertension   . Kidney stones 1978   "passed them"  . Melanoma of right side of neck ([place])    removed posterior neck 12/312013  . Migraine    "stopped in 2000 when I had my heart attack"  . Myocardial infarction ([place]) 2000  . Pre-diabetes    checks cbg q month  . Rotator cuff tear, right   . Wears dentures    upper-lower partial  . Wears glasses     Past Surgical History:  Procedure Laterality Date  . CARDIAC CATHETERIZATION N/A 12/20/2015   Procedure: Left Heart Cath and Coronary Angiography;  Surgeon: Belva Crome, MD;  Location: [facility];  Service: Cardiovascular;  Laterality: N/A;  . CARDIAC CATHETERIZATION N/A 12/20/2015   Procedure: Coronary Stent  Intervention;  Surgeon: Belva Crome, MD;  Location: [facility];  Service: Cardiovascular;  Laterality: N/A;  . CATARACT EXTRACTION W/ INTRAOCULAR LENS  IMPLANT, BILATERAL Bilateral 2011  . CIRCUMCISION  age 50  . COLONOSCOPY    . CORONARY ANGIOPLASTY WITH STENT PLACEMENT  2000   "1 stent"  . CORONARY ANGIOPLASTY WITH STENT PLACEMENT  12/20/2015   "1 stent"  . CYSTOSCOPY/URETEROSCOPY/HOLMIUM LASER/STENT PLACEMENT Right 08/03/2019   Procedure: CYSTOSCOPY/RETROGRADE/URETEROSCOPY//STENT PLACEMENT;  Surgeon: Ceasar Mons, MD;  Location: [facility];  Service: Urology;  Laterality: Right;  . HEMORRHOID SURGERY     "burned them off"  . MELANOMA EXCISION WITH SENTINEL LYMPH NODE BIOPSY  07/21/2012   Procedure: MELANOMA EXCISION WITH SENTINEL LYMPH NODE BIOPSY;  Surgeon: Rozetta Nunnery, MD;  Location: [facility];  Service: General;  Laterality: N/A;  melanoma excision with sentinel node biopsy   posterior right neck  . TONSILLECTOMY  1961    There were no vitals filed for this visit.  Subjective Assessment - 12/09/19 0954    Subjective  Patient reports he is feeling well today. Has a bit of soreness in the right shoulder but no numeric rating  for pain. States something changed yesterday in a good way and his shoulder feels more "normal." No excessive pain or soreness following last treatment session.    Pertinent History  Patient is a 79 y.o. male who presents to outpatient physical therapy with a referral for medical diagnosis chonic R shoulder pain, nontraumatic complete tear of R rotator cuff. This patient's chief complaints consist of R shoulder pain, stiffness, weakness, surgical precautions s/p R RTC repair 09/02/2019 leading to the following functional deficits: inability to lift or use R UE for activities such as reaching, lifting, carrying, stabilizing, pushing, pulling, grooming, ADLs, IADLs, yardwork, farmwork, community and social  activity. Relevant past medical history and comorbidities include hx of MI (2000) with stent placement x2 (2017, 2000), skin cancer, carcinoma, melanoma (removed 6 years ago), pre-diabetes, HTN, arthritis, kidney stones, cataract implants, hx of adenomatous colonic polyps. Denies lung problem, seizures, stroke.    Diagnostic tests  Pre-Op MRI report 08/17/2019: "IMPRESSION:1. Supraspinatus tendinosis with full-thickness non-retracted tearof the anterior tendon fibers in the region of the critical zone.2. Subscapularis tendinosis with partial-thickness tearing.3. Intra-articular biceps tendinosis with partial tearing.4. Glenohumeral and acromioclavicular osteoarthritis."    Patient Stated Goals  get better    Currently in Pain?  No/denies       OBJECTIVE  FOTO =60(11/17/2019)  R shoulder PROM(last measured5/13/21) - flexion: 160*  R shoulder AAROM(on wall) - flexion 140 - scaption 150  R shoulder AROM: - FER: T2  - FIR: L3   TREATMENT: R RTC repair 09/02/2019 14weeks(12/09/2019)  Therapeutic exercise:to centralize symptoms and improve ROM, strength, muscular endurance, and activity tolerance required for successful completion of functional activities. - standing AAROM R shoulder wall slide into flexion, scaption, x 20 each.  Circuit:  - standing scaption to comfortable height, no weight. 1x10, 2x20. Slight discomfort that gets no worse.  - standing dynamic R shoulder IR with towel roll under arm,green theraband3x20.1secH. - standing dynamic R shoulderERwith towel roll under arm,3x20 withredtheraband,focused on improving ROM, 1secH.  - seated R shoulder ER with bolster under elbow at 65 degrees scaption, 2x10 with 2# DB  - education about discontinuing scaption abduction exercises and IR stretch until next session due to elevated soreness and pain with those activities. Discussed updates in resistance and refinement of technique for other  exercises.  Manual therapy:to reduce pain and tissue tension, improve range of motion, neuromodulation, in order to promote improved ability to complete functional activities. - PROM R shoulder flexion x 10 each with 5 second hold at end range with gentle OP as tolerated.  - STM to anterior, lateraland posterior R shoulder for improved comfort and decreased tension.   HOME EXERCISE PROGRAM Access Code: [email or web address] URL: [email or web address] Date: 11/15/2019 Prepared by: Rosita Kea  Exercises Supine Shoulder Flexion Extension AAROM with Dowel - 1 x daily - 20 reps - 5 seconds hold Supine Shoulder Circles - 1-2 x daily - 2-3 sets - 20 reps Shoulder Flexion Wall Slide with Towel - 1 x daily - 1 sets - 20 reps - 5 seconds hold Row with band/cable - 1-2 x daily - 30 reps Shoulder Internal Rotation with Resistance - 1 x daily - 1 sets - 20 reps - 3 seconds hold Shoulder External Rotation with Anchored Resistance - 1 x daily - 1 sets - 20 reps - 3 seconds hold Standing Single Arm Bicep Curls with Rotation and Dumbbell - 1 x daily - 2-3 sets - 10 reps Standing Shoulder Scaption - 1 x daily - 2-3 sets -  10 reps Supine Shoulder Flexion Extension Full Range AROM Sidelying Shoulder Abduction Palm Forward    PT Education - 12/09/19 0955    Education Details  Exercise purpose/form. Self management techniques    Person(s) Educated  Patient    Methods  Explanation;Demonstration;Tactile cues;Verbal cues    Comprehension  Verbalized understanding;Returned demonstration;Verbal cues required;Tactile cues required;Need further instruction       PT Short Term Goals - 09/27/19 1951      PT SHORT TERM GOAL #1   Title  Be independent with initial home exercise program for self-management of symptoms.    Baseline  Initial HEP provided at IE (09/13/2019);    Time  2    Period  Weeks    Status  Achieved    Target Date  09/27/19        PT Long Term Goals - 11/17/19 1432       PT LONG TERM GOAL #1   Title  Be independent with a long-term home exercise program for self-management of symptoms.    Baseline  Initial HEP provided at IE (09/13/2019); currently participating in appropriate HEP (10/18/2019; 11/17/2019);    Time  12    Period  Weeks    Status  Partially Met    Target Date  02/09/20      PT LONG TERM GOAL #2   Title  Demonstrate improved FOTO score to equal or greater than 65 to demonstrate improvement in overall condition and self-reported functional ability.    Baseline  FOTO = 43 (09/13/2019); FOTO = 54 10/18/2019); 60 (11/17/2019);    Time  12    Period  Weeks    Status  Partially Met    Target Date  02/09/20      PT LONG TERM GOAL #3   Title  Have R shoulder AROM equal or greater than L shoulder AROM with no compensations or increase in pain in all planes except intermittent end range discomfort to allow patient to complete valued activities with less difficulty.    Baseline  see objective exam, AROM deferred due to post-op precautions (09/13/2019); able to participate in gentle AROM - see objective (10/18/2019); significant improvement (09/19/2019);    Time  12    Period  Weeks    Status  Partially Met    Target Date  02/09/20      PT LONG TERM GOAL #4   Title  Improve R shoulder strength to 4+/5 for improved ability to allow patient to complete valued functional tasks such as reaching overhead and yardwork with less difficulty.    Baseline  testing deferred due to post-op precautions (09/13/2019); now able to complete AROM in limited range 2/5 (10/18/2019); able to tolerate moderate pressure (11/17/2019);    Time  12    Period  Weeks    Status  Partially Met    Target Date  02/09/20      PT LONG TERM GOAL #5   Title  Complete community, work and/or recreational activities without limitation due to current condition.    Baseline  inability to lift or use R UE for activities such as reaching, lifting, carrying, stabilizing, pushing, pulling, grooming,  ADLs, IADLs, yardwork, farmwork, community and social activity (09/13/2019); able to tie shows, improved gromming, ADLs, slight reaching and light carrying (10/18/2019); improving (11/17/2019);    Time  12    Period  Weeks    Status  Partially Met    Target Date  02/09/20  Plan - 12/09/19 1005    Clinical Impression Statement  Patient tolerated treatment well and continues to make steady progress towards goals. Able to progress to R GHJ ER in partially elevated scaption today with support. Continues to require cuing to improve form and full ROM during rotator cuff exercises. Patient would benefit from continued management of limiting condition by skilled physical therapist to address remaining impairments and functional limitations to work towards stated goals and return to PLOF or maximal functional independence.    Personal Factors and Comorbidities  Age;Behavior Pattern;Comorbidity 3+;Education;Social Background;Time since onset of injury/illness/exacerbation;Past/Current Experience;Fitness    Comorbidities  hx of MI (2000) with stent placement x2 (2017, 2000), skin cancer, carcinoma, melanoma (removed 6 years ago), pre-diabetes, HTN, arthritis, kidney stones, cataract implants, hx of adenomatous colonic polyps. Denies lung problem, seizures, stroke    Examination-Activity Limitations  Bed Mobility;Bathing;Reach Overhead;Self Feeding;Carry;Dressing;Hygiene/Grooming;Sleep;Lift;Toileting    Examination-Participation Restrictions  Driving;Community Activity;Cleaning;Yard Work;Meal Prep;Other    Stability/Clinical Decision Making  Stable/Uncomplicated    Rehab Potential  Good    PT Frequency  2x / week    PT Duration  12 weeks    PT Treatment/Interventions  ADLs/Self Care Home Management;Aquatic Therapy;Electrical Stimulation;Cryotherapy;Moist Heat;DME Instruction;Therapeutic activities;Therapeutic exercise;Balance training;Neuromuscular re-education;Patient/family education;Manual  techniques;Compression bandaging;Scar mobilization;Dry needling;Passive range of motion;Joint Manipulations;Spinal Manipulations    PT Next Visit Plan  Exercises/manual for improved ROM, progressive shoulder girdle strengthening, stability, and functional activities as tolerated.    PT Home Exercise Plan  Medbridge Access Code: [email or web address]    Consulted and Agree with Plan of Care  Patient       Patient will benefit from skilled therapeutic intervention in order to improve the following deficits and impairments:  Decreased knowledge of use of DME, Decreased skin integrity, Increased fascial restricitons, Pain, Increased muscle spasms, Decreased scar mobility, Decreased coordination, Decreased activity tolerance, Decreased endurance, Decreased range of motion, Decreased strength, Hypomobility, Impaired perceived functional ability, Impaired UE functional use, Impaired flexibility, Increased edema, Decreased safety awareness, Decreased knowledge of precautions  Visit Diagnosis: Chronic right shoulder pain  Stiffness of right shoulder, not elsewhere classified  Muscle weakness (generalized)     Problem List Patient Active Problem List   Diagnosis Date Noted  . Complete tear of right rotator cuff 09/07/2019  . Pain in right shoulder 07/27/2019  . Pre-diabetes 12/21/2015  . Abnormal stress test 12/20/2015  . Hypertension   . Coronary artery disease   . Hyperlipemia     Everlean Alstrom. Graylon Good, PT, DPT 12/09/19, 10:25 AM  [facility] [address], [place], [address] Phone: [phone]   Fax:  [phone]  Name: Kevin Stewart MRN: [id number] Date of Birth: 1940/09/07

## 2019-12-13 ENCOUNTER — Other Ambulatory Visit: Payer: Self-pay

## 2019-12-13 ENCOUNTER — Encounter: Payer: Self-pay | Admitting: Physical Therapy

## 2019-12-13 ENCOUNTER — Ambulatory Visit: Payer: Medicare Other | Admitting: Physical Therapy

## 2019-12-13 DIAGNOSIS — G8929 Other chronic pain: Secondary | ICD-10-CM | POA: Diagnosis not present

## 2019-12-13 DIAGNOSIS — M25611 Stiffness of right shoulder, not elsewhere classified: Secondary | ICD-10-CM | POA: Diagnosis not present

## 2019-12-13 DIAGNOSIS — M6281 Muscle weakness (generalized): Secondary | ICD-10-CM | POA: Diagnosis not present

## 2019-12-13 DIAGNOSIS — M25511 Pain in right shoulder: Secondary | ICD-10-CM | POA: Diagnosis not present

## 2019-12-13 NOTE — Therapy (Addendum)
[facility] [address], [place], [address] Phone: [phone]   Fax:  [phone]  Physical Therapy Treatment  Patient Details  Name: TREAVOR BLOMQUIST MRN: [id number] Date of Birth: 1941-02-01 Referring Provider (PT): Garald Balding, MD   Encounter Date: 12/13/2019  PT End of Session - 12/13/19 0955    Visit Number  26    Number of Visits  43    Date for PT Re-Evaluation  02/09/20    Authorization Type  [employer] medicare reporting period from 11/25/2019    Authorization Time Period  N/A    Authorization - Visit Number  6    Authorization - Number of Visits  10    Progress Note Due on Visit  30   Next FOTO due = 4/26   PT Start Time  0950    PT Stop Time  1030    PT Time Calculation (min)  40 min    Activity Tolerance  Patient tolerated treatment well;No increased pain    Behavior During Therapy  WFL for tasks assessed/performed       Past Medical History:  Diagnosis Date  . Arthritis    "hands" (12/20/2015)  . Basal cell carcinoma, face   . Coronary artery disease    a. 2003 - light MI per patient; s/p Cypher DES to prox LAD in [facility].  Marland Kitchen Hx of adenomatous colonic polyps   . Hyperlipemia   . Hyperlipidemia   . Hypertension   . Kidney stones 1978   "passed them"  . Melanoma of right side of neck ([place])    removed posterior neck 12/312013  . Migraine    "stopped in 2000 when I had my heart attack"  . Myocardial infarction ([place]) 2000  . Pre-diabetes    checks cbg q month  . Rotator cuff tear, right   . Wears dentures    upper-lower partial  . Wears glasses     Past Surgical History:  Procedure Laterality Date  . CARDIAC CATHETERIZATION N/A 12/20/2015   Procedure: Left Heart Cath and Coronary Angiography;  Surgeon: Belva Crome, MD;  Location: [facility];  Service: Cardiovascular;  Laterality: N/A;  . CARDIAC CATHETERIZATION N/A 12/20/2015   Procedure: Coronary Stent  Intervention;  Surgeon: Belva Crome, MD;  Location: [facility];  Service: Cardiovascular;  Laterality: N/A;  . CATARACT EXTRACTION W/ INTRAOCULAR LENS  IMPLANT, BILATERAL Bilateral 2011  . CIRCUMCISION  age 52  . COLONOSCOPY    . CORONARY ANGIOPLASTY WITH STENT PLACEMENT  2000   "1 stent"  . CORONARY ANGIOPLASTY WITH STENT PLACEMENT  12/20/2015   "1 stent"  . CYSTOSCOPY/URETEROSCOPY/HOLMIUM LASER/STENT PLACEMENT Right 08/03/2019   Procedure: CYSTOSCOPY/RETROGRADE/URETEROSCOPY//STENT PLACEMENT;  Surgeon: Ceasar Mons, MD;  Location: [facility];  Service: Urology;  Laterality: Right;  . HEMORRHOID SURGERY     "burned them off"  . MELANOMA EXCISION WITH SENTINEL LYMPH NODE BIOPSY  07/21/2012   Procedure: MELANOMA EXCISION WITH SENTINEL LYMPH NODE BIOPSY;  Surgeon: Rozetta Nunnery, MD;  Location: [facility];  Service: General;  Laterality: N/A;  melanoma excision with sentinel node biopsy   posterior right neck  . TONSILLECTOMY  1961    There were no vitals filed for this visit.  Subjective Assessment - 12/13/19 0954    Subjective  Patient states he had a good weekend and is feeling well. He reports he stretched IR a little more aggressively  last night and is a bit sore from that.    Pertinent History  Patient is a 79 y.o. male who presents to outpatient physical therapy with a referral for medical diagnosis chonic R shoulder pain, nontraumatic complete tear of R rotator cuff. This patient's chief complaints consist of R shoulder pain, stiffness, weakness, surgical precautions s/p R RTC repair 09/02/2019 leading to the following functional deficits: inability to lift or use R UE for activities such as reaching, lifting, carrying, stabilizing, pushing, pulling, grooming, ADLs, IADLs, yardwork, farmwork, community and social activity. Relevant past medical history and comorbidities include hx of MI (2000) with stent placement x2 (2017, 2000),  skin cancer, carcinoma, melanoma (removed 6 years ago), pre-diabetes, HTN, arthritis, kidney stones, cataract implants, hx of adenomatous colonic polyps. Denies lung problem, seizures, stroke.    Diagnostic tests  Pre-Op MRI report 08/17/2019: "IMPRESSION:1. Supraspinatus tendinosis with full-thickness non-retracted tearof the anterior tendon fibers in the region of the critical zone.2. Subscapularis tendinosis with partial-thickness tearing.3. Intra-articular biceps tendinosis with partial tearing.4. Glenohumeral and acromioclavicular osteoarthritis."    Patient Stated Goals  get better    Currently in Pain?  Yes    Pain Score  1     Pain Location  Shoulder    Pain Orientation  Right    Pain Descriptors / Indicators  Aching       OBJECTIVE  FOTO =60(11/17/2019)  R shoulder PROM(last measured5/13/21) - flexion: 160*  R shoulder AAROM(on wall) - flexion 148 - scaption 141  R shoulder AROM: - Flexion: 140 - Abduction: 135 - FER: T3 - FIR: L2   TREATMENT: R RTC repair 09/02/2019 78weeks, 4 days(12/13/2019)  Manual therapy:to reduce pain and tissue tension, improve range of motion, neuromodulation, in order to promote improved ability to complete functional activities. - PROM R shoulder flexion. Abduction/scaption, IR and ER x 10 each with 5 second hold at end range with gentle OP as tolerated.  - STM to anterior, lateraland posterior R shoulder for improved comfort and decreased tension.  Therapeutic exercise:to centralize symptoms and improve ROM, strength, muscular endurance, and activity tolerance required for successful completion of functional activities. - standing AAROM R shoulder wall slide into flexion, scaption, x 20 each. - standing dynamic R shoulder IR with towel roll under arm,greentheraband1x20, red theraband 1x20.1secH. x3 with blue theraband (good form, no pain) - standing dynamic R shoulderERwith towel roll under arm,1x20  withredtheraband,1x20, 1x5 green theraband, focused on improving ROM,1secH.  - Education on HEP including handout   - education about discontinuing scaption abduction exercises and IR stretch until next session due to elevated soreness and pain with those activities. Discussed updates in resistance and refinement of technique for other exercises.     HOME EXERCISE PROGRAM Access Code: [email or web address] URL: [email or web address] Date: 11/15/2019 Prepared by: Rosita Kea  Exercises Supine Shoulder Flexion Extension AAROM with Dowel - 1 x daily - 20 reps - 5 seconds hold Supine Shoulder Circles - 1-2 x daily - 2-3 sets - 20 reps Shoulder Flexion Wall Slide with Towel - 1 x daily - 1 sets - 20 reps - 5 seconds hold Row with band/cable - 1-2 x daily - 30 reps Shoulder Internal Rotation with Resistance - 1 x daily - 1 sets - 20 reps - 3 seconds hold Shoulder External Rotation with Anchored Resistance - 1 x daily - 1 sets - 20 reps - 3 seconds hold Standing Single Arm Bicep Curls with Rotation and Dumbbell - 1 x daily - 2-3 sets - 10 reps  Standing Shoulder Scaption - 1 x daily - 2-3 sets - 10 reps Supine Shoulder Flexion Extension Full Range AROM Sidelying Shoulder Abduction Palm Forward    PT Education - 12/13/19 0955    Education Details  Exercise purpose/form. Self management techniques    Person(s) Educated  Patient    Methods  Explanation;Demonstration;Tactile cues;Verbal cues    Comprehension  Verbalized understanding;Returned demonstration;Verbal cues required;Tactile cues required       PT Short Term Goals - 09/27/19 1951      PT SHORT TERM GOAL #1   Title  Be independent with initial home exercise program for self-management of symptoms.    Baseline  Initial HEP provided at IE (09/13/2019);    Time  2    Period  Weeks    Status  Achieved    Target Date  09/27/19        PT Long Term Goals - 11/17/19 1432      PT LONG TERM GOAL #1   Title  Be  independent with a long-term home exercise program for self-management of symptoms.    Baseline  Initial HEP provided at IE (09/13/2019); currently participating in appropriate HEP (10/18/2019; 11/17/2019);    Time  12    Period  Weeks    Status  Partially Met    Target Date  02/09/20      PT LONG TERM GOAL #2   Title  Demonstrate improved FOTO score to equal or greater than 65 to demonstrate improvement in overall condition and self-reported functional ability.    Baseline  FOTO = 43 (09/13/2019); FOTO = 54 10/18/2019); 60 (11/17/2019);    Time  12    Period  Weeks    Status  Partially Met    Target Date  02/09/20      PT LONG TERM GOAL #3   Title  Have R shoulder AROM equal or greater than L shoulder AROM with no compensations or increase in pain in all planes except intermittent end range discomfort to allow patient to complete valued activities with less difficulty.    Baseline  see objective exam, AROM deferred due to post-op precautions (09/13/2019); able to participate in gentle AROM - see objective (10/18/2019); significant improvement (09/19/2019);    Time  12    Period  Weeks    Status  Partially Met    Target Date  02/09/20      PT LONG TERM GOAL #4   Title  Improve R shoulder strength to 4+/5 for improved ability to allow patient to complete valued functional tasks such as reaching overhead and yardwork with less difficulty.    Baseline  testing deferred due to post-op precautions (09/13/2019); now able to complete AROM in limited range 2/5 (10/18/2019); able to tolerate moderate pressure (11/17/2019);    Time  12    Period  Weeks    Status  Partially Met    Target Date  02/09/20      PT LONG TERM GOAL #5   Title  Complete community, work and/or recreational activities without limitation due to current condition.    Baseline  inability to lift or use R UE for activities such as reaching, lifting, carrying, stabilizing, pushing, pulling, grooming, ADLs, IADLs, yardwork, farmwork,  community and social activity (09/13/2019); able to tie shows, improved gromming, ADLs, slight reaching and light carrying (10/18/2019); improving (11/17/2019);    Time  12    Period  Weeks    Status  Partially Met    Target  Date  02/09/20            Plan - 12/13/19 1040    Clinical Impression Statement  Patient tolerated treatment well overall and continues to make excellent progress towards goals. Was able to progress to higher resistance bands on RTC exercises. Patient would benefit from continued management of limiting condition by skilled physical therapist to address remaining impairments and functional limitations to work towards stated goals and return to PLOF or maximal functional independence.    Personal Factors and Comorbidities  Age;Behavior Pattern;Comorbidity 3+;Education;Social Background;Time since onset of injury/illness/exacerbation;Past/Current Experience;Fitness    Comorbidities  hx of MI (2000) with stent placement x2 (2017, 2000), skin cancer, carcinoma, melanoma (removed 6 years ago), pre-diabetes, HTN, arthritis, kidney stones, cataract implants, hx of adenomatous colonic polyps. Denies lung problem, seizures, stroke    Examination-Activity Limitations  Bed Mobility;Bathing;Reach Overhead;Self Feeding;Carry;Dressing;Hygiene/Grooming;Sleep;Lift;Toileting    Examination-Participation Restrictions  Driving;Community Activity;Cleaning;Yard Work;Meal Prep;Other    Stability/Clinical Decision Making  Stable/Uncomplicated    Rehab Potential  Good    PT Frequency  2x / week    PT Duration  12 weeks    PT Treatment/Interventions  ADLs/Self Care Home Management;Aquatic Therapy;Electrical Stimulation;Cryotherapy;Moist Heat;DME Instruction;Therapeutic activities;Therapeutic exercise;Balance training;Neuromuscular re-education;Patient/family education;Manual techniques;Compression bandaging;Scar mobilization;Dry needling;Passive range of motion;Joint Manipulations;Spinal Manipulations     PT Next Visit Plan  Exercises/manual for improved ROM, progressive shoulder girdle strengthening, stability, and functional activities as tolerated.    PT Home Exercise Plan  Medbridge Access Code: [email or web address]    Consulted and Agree with Plan of Care  Patient       Patient will benefit from skilled therapeutic intervention in order to improve the following deficits and impairments:  Decreased knowledge of use of DME, Decreased skin integrity, Increased fascial restricitons, Pain, Increased muscle spasms, Decreased scar mobility, Decreased coordination, Decreased activity tolerance, Decreased endurance, Decreased range of motion, Decreased strength, Hypomobility, Impaired perceived functional ability, Impaired UE functional use, Impaired flexibility, Increased edema, Decreased safety awareness, Decreased knowledge of precautions  Visit Diagnosis: Chronic right shoulder pain  Stiffness of right shoulder, not elsewhere classified  Muscle weakness (generalized)     Problem List Patient Active Problem List   Diagnosis Date Noted  . Complete tear of right rotator cuff 09/07/2019  . Pain in right shoulder 07/27/2019  . Pre-diabetes 12/21/2015  . Abnormal stress test 12/20/2015  . Hypertension   . Coronary artery disease   . Hyperlipemia     Everlean Alstrom. Graylon Good, PT, DPT 12/13/19, 10:41 AM  [facility] [address], [place], [address] Phone: [phone]   Fax:  [phone]  Name: CRESPIN FORSTROM MRN: [id number] Date of Birth: 12-19-1940

## 2019-12-14 ENCOUNTER — Encounter: Payer: Medicare Other | Admitting: Physical Therapy

## 2019-12-15 ENCOUNTER — Encounter: Payer: Self-pay | Admitting: Physical Therapy

## 2019-12-15 ENCOUNTER — Ambulatory Visit: Payer: Medicare Other | Admitting: Physical Therapy

## 2019-12-15 ENCOUNTER — Other Ambulatory Visit: Payer: Self-pay

## 2019-12-15 DIAGNOSIS — M25611 Stiffness of right shoulder, not elsewhere classified: Secondary | ICD-10-CM | POA: Diagnosis not present

## 2019-12-15 DIAGNOSIS — M25511 Pain in right shoulder: Secondary | ICD-10-CM | POA: Diagnosis not present

## 2019-12-15 DIAGNOSIS — M6281 Muscle weakness (generalized): Secondary | ICD-10-CM | POA: Diagnosis not present

## 2019-12-15 DIAGNOSIS — G8929 Other chronic pain: Secondary | ICD-10-CM | POA: Diagnosis not present

## 2019-12-15 NOTE — Therapy (Signed)
[facility] [address], [place], [address] Phone: [phone]   Fax:  [phone]  Physical Therapy Treatment  Patient Details  Name: Kevin Stewart MRN: [id number] Date of Birth: 12-23-1940 Referring Provider (PT): Garald Balding, MD   Encounter Date: 12/15/2019  PT End of Session - 12/15/19 1820    Visit Number  27    Number of Visits  43    Date for PT Re-Evaluation  02/09/20    Authorization Type  [employer] medicare reporting period from 11/25/2019    Authorization Time Period  N/A    Authorization - Visit Number  7    Authorization - Number of Visits  10    Progress Note Due on Visit  30   Next FOTO due = 4/26   PT Start Time  1435    PT Stop Time  1515    PT Time Calculation (min)  40 min    Activity Tolerance  Patient tolerated treatment well;No increased pain    Behavior During Therapy  WFL for tasks assessed/performed       Past Medical History:  Diagnosis Date  . Arthritis    "hands" (12/20/2015)  . Basal cell carcinoma, face   . Coronary artery disease    a. 2003 - light MI per patient; s/p Cypher DES to prox LAD in [facility].  Marland Kitchen Hx of adenomatous colonic polyps   . Hyperlipemia   . Hyperlipidemia   . Hypertension   . Kidney stones 1978   "passed them"  . Melanoma of right side of neck ([place])    removed posterior neck 12/312013  . Migraine    "stopped in 2000 when I had my heart attack"  . Myocardial infarction ([place]) 2000  . Pre-diabetes    checks cbg q month  . Rotator cuff tear, right   . Wears dentures    upper-lower partial  . Wears glasses     Past Surgical History:  Procedure Laterality Date  . CARDIAC CATHETERIZATION N/A 12/20/2015   Procedure: Left Heart Cath and Coronary Angiography;  Surgeon: Belva Crome, MD;  Location: [facility];  Service: Cardiovascular;  Laterality: N/A;  . CARDIAC CATHETERIZATION N/A 12/20/2015   Procedure: Coronary Stent  Intervention;  Surgeon: Belva Crome, MD;  Location: [facility];  Service: Cardiovascular;  Laterality: N/A;  . CATARACT EXTRACTION W/ INTRAOCULAR LENS  IMPLANT, BILATERAL Bilateral 2011  . CIRCUMCISION  age 54  . COLONOSCOPY    . CORONARY ANGIOPLASTY WITH STENT PLACEMENT  2000   "1 stent"  . CORONARY ANGIOPLASTY WITH STENT PLACEMENT  12/20/2015   "1 stent"  . CYSTOSCOPY/URETEROSCOPY/HOLMIUM LASER/STENT PLACEMENT Right 08/03/2019   Procedure: CYSTOSCOPY/RETROGRADE/URETEROSCOPY//STENT PLACEMENT;  Surgeon: Ceasar Mons, MD;  Location[facility];  Service: Urology;  Laterality: Right;  . HEMORRHOID SURGERY     "burned them off"  . MELANOMA EXCISION WITH SENTINEL LYMPH NODE BIOPSY  07/21/2012   Procedure: MELANOMA EXCISION WITH SENTINEL LYMPH NODE BIOPSY;  Surgeon: Rozetta Nunnery, MD;  Location: [facility];  Service: General;  Laterality: N/A;  melanoma excision with sentinel node biopsy   posterior right neck  . TONSILLECTOMY  1961    There were no vitals filed for this visit.  Subjective Assessment - 12/15/19 1818    Subjective  Patient reports he is feeling well. thinks he may have overdone it a bit with the shoulder ER exercise and  was a bit sore following his exercises today. Continues to feel most limited by dififculty reaching behind his back. States he was a bit sore the morning after his last treatment session.    Pertinent History  Patient is a 79 y.o. male who presents to outpatient physical therapy with a referral for medical diagnosis chonic R shoulder pain, nontraumatic complete tear of R rotator cuff. This patient's chief complaints consist of R shoulder pain, stiffness, weakness, surgical precautions s/p R RTC repair 09/02/2019 leading to the following functional deficits: inability to lift or use R UE for activities such as reaching, lifting, carrying, stabilizing, pushing, pulling, grooming, ADLs, IADLs, yardwork, farmwork,  community and social activity. Relevant past medical history and comorbidities include hx of MI (2000) with stent placement x2 (2017, 2000), skin cancer, carcinoma, melanoma (removed 6 years ago), pre-diabetes, HTN, arthritis, kidney stones, cataract implants, hx of adenomatous colonic polyps. Denies lung problem, seizures, stroke.    Diagnostic tests  Pre-Op MRI report 08/17/2019: "IMPRESSION:1. Supraspinatus tendinosis with full-thickness non-retracted tearof the anterior tendon fibers in the region of the critical zone.2. Subscapularis tendinosis with partial-thickness tearing.3. Intra-articular biceps tendinosis with partial tearing.4. Glenohumeral and acromioclavicular osteoarthritis."    Patient Stated Goals  get better    Currently in Pain?  No/denies       OBJECTIVE  FOTO =60(11/17/2019)  R shoulder PROM(last measured5/13/21) - flexion: 160*  R shoulder AAROM(on wall) - flexion 148 - scaption 141  R shoulder AROM: - Flexion: 140 - Abduction: 135 - FER: T3 - FIR: L2   TREATMENT: R RTC repair 09/02/2019 14weeks, 4 days(12/13/2019)  Manual therapy:to reduce pain and tissue tension, improve range of motion, neuromodulation, in order to promote improved ability to complete functional activities. - PROM R shoulder flexion. Abduction/scaption, Extension, IR and ER x 10 each with 5 second hold at end range with gentle OP as tolerated.  - STM to anterior, lateraland posterior R shoulder for improved comfort and decreased tension.  Therapeutic exercise:to centralize symptoms and improve ROM, strength, muscular endurance, and activity tolerance required for successful completion of functional activities. - standing AAROM R shoulder wall slide into flexion, scaption, x 20 each. - seated R GHJ ER with elbow resting on bolster at about 70 degrees scaption, 1x20 with 2# DB, 2x20 with 3#DB. Strong fatigue last 3 reps.  - suitcase carry with slight shoulder abduction,  5# weight, 3x100 feet.   - Education on HEP including handout   Pt required multimodal cuing for proper technique and to facilitate improved neuromuscular control, strength, range of motion, and functional ability resulting in improved performance and form.   HOME EXERCISE PROGRAM Access Code: [email or web address] URL: [email or web address] Date: 11/15/2019 Prepared by: Rosita Kea  Exercises Supine Shoulder Flexion Extension AAROM with Dowel - 1 x daily - 20 reps - 5 seconds hold Supine Shoulder Circles - 1-2 x daily - 2-3 sets - 20 reps Shoulder Flexion Wall Slide with Towel - 1 x daily - 1 sets - 20 reps - 5 seconds hold Row with band/cable - 1-2 x daily - 30 reps Shoulder Internal Rotation with Resistance - 1 x daily - 2 sets - 20 reps - 3 seconds hold Shoulder External Rotation with Anchored Resistance - 1 x daily - 2 sets - 20 reps - 3 seconds hold Standing Single Arm Bicep Curls with Rotation and Dumbbell - 1 x daily - 2-3 sets - 10 reps Standing Shoulder Scaption - 1 x daily - 2-3 sets - 10 reps Seated Shoulder  External Rotation in Abduction Supported with Dumbbell - 1 x daily - 2 sets - 20 reps Kettlebell Suitcase Carry - 1-2 x daily - 3 reps - 1 minute walk time    PT Education - 12/15/19 1820    Education Details  Exercise purpose/form. Self management techniques. HEP    Person(s) Educated  Patient    Methods  Explanation;Demonstration;Tactile cues;Verbal cues;Handout    Comprehension  Verbalized understanding;Returned demonstration;Verbal cues required;Tactile cues required;Need further instruction       PT Short Term Goals - 09/27/19 1951      PT SHORT TERM GOAL #1   Title  Be independent with initial home exercise program for self-management of symptoms.    Baseline  Initial HEP provided at IE (09/13/2019);    Time  2    Period  Weeks    Status  Achieved    Target Date  09/27/19        PT Long Term Goals - 11/17/19 1432      PT LONG TERM GOAL #1    Title  Be independent with a long-term home exercise program for self-management of symptoms.    Baseline  Initial HEP provided at IE (09/13/2019); currently participating in appropriate HEP (10/18/2019; 11/17/2019);    Time  12    Period  Weeks    Status  Partially Met    Target Date  02/09/20      PT LONG TERM GOAL #2   Title  Demonstrate improved FOTO score to equal or greater than 65 to demonstrate improvement in overall condition and self-reported functional ability.    Baseline  FOTO = 43 (09/13/2019); FOTO = 54 10/18/2019); 60 (11/17/2019);    Time  12    Period  Weeks    Status  Partially Met    Target Date  02/09/20      PT LONG TERM GOAL #3   Title  Have R shoulder AROM equal or greater than L shoulder AROM with no compensations or increase in pain in all planes except intermittent end range discomfort to allow patient to complete valued activities with less difficulty.    Baseline  see objective exam, AROM deferred due to post-op precautions (09/13/2019); able to participate in gentle AROM - see objective (10/18/2019); significant improvement (09/19/2019);    Time  12    Period  Weeks    Status  Partially Met    Target Date  02/09/20      PT LONG TERM GOAL #4   Title  Improve R shoulder strength to 4+/5 for improved ability to allow patient to complete valued functional tasks such as reaching overhead and yardwork with less difficulty.    Baseline  testing deferred due to post-op precautions (09/13/2019); now able to complete AROM in limited range 2/5 (10/18/2019); able to tolerate moderate pressure (11/17/2019);    Time  12    Period  Weeks    Status  Partially Met    Target Date  02/09/20      PT LONG TERM GOAL #5   Title  Complete community, work and/or recreational activities without limitation due to current condition.    Baseline  inability to lift or use R UE for activities such as reaching, lifting, carrying, stabilizing, pushing, pulling, grooming, ADLs, IADLs, yardwork,  farmwork, community and social activity (09/13/2019); able to tie shows, improved gromming, ADLs, slight reaching and light carrying (10/18/2019); improving (11/17/2019);    Time  12    Period  Weeks  Status  Partially Met    Target Date  02/09/20            Plan - 12/15/19 1823    Clinical Impression Statement  Patient tolerated treatment well and continues to make good progress towards goals. Was able to progress to elevated ER and farmer's carry today. No loss of AROM this session suggests that patient's soreness is appropriate. Has not returned to PLOF. Patient would benefit from continued management of limiting condition by skilled physical therapist to address remaining impairments and functional limitations to work towards stated goals and return to PLOF or maximal functional independence.    Personal Factors and Comorbidities  Age;Behavior Pattern;Comorbidity 3+;Education;Social Background;Time since onset of injury/illness/exacerbation;Past/Current Experience;Fitness    Comorbidities  hx of MI (2000) with stent placement x2 (2017, 2000), skin cancer, carcinoma, melanoma (removed 6 years ago), pre-diabetes, HTN, arthritis, kidney stones, cataract implants, hx of adenomatous colonic polyps. Denies lung problem, seizures, stroke    Examination-Activity Limitations  Bed Mobility;Bathing;Reach Overhead;Self Feeding;Carry;Dressing;Hygiene/Grooming;Sleep;Lift;Toileting    Examination-Participation Restrictions  Driving;Community Activity;Cleaning;Yard Work;Meal Prep;Other    Stability/Clinical Decision Making  Stable/Uncomplicated    Rehab Potential  Good    PT Frequency  2x / week    PT Duration  12 weeks    PT Treatment/Interventions  ADLs/Self Care Home Management;Aquatic Therapy;Electrical Stimulation;Cryotherapy;Moist Heat;DME Instruction;Therapeutic activities;Therapeutic exercise;Balance training;Neuromuscular re-education;Patient/family education;Manual techniques;Compression  bandaging;Scar mobilization;Dry needling;Passive range of motion;Joint Manipulations;Spinal Manipulations    PT Next Visit Plan  Exercises/manual for improved ROM, progressive shoulder girdle strengthening, stability, and functional activities as tolerated.    PT Home Exercise Plan  Medbridge Access Code: [email or web address]    Consulted and Agree with Plan of Care  Patient       Patient will benefit from skilled therapeutic intervention in order to improve the following deficits and impairments:  Decreased knowledge of use of DME, Decreased skin integrity, Increased fascial restricitons, Pain, Increased muscle spasms, Decreased scar mobility, Decreased coordination, Decreased activity tolerance, Decreased endurance, Decreased range of motion, Decreased strength, Hypomobility, Impaired perceived functional ability, Impaired UE functional use, Impaired flexibility, Increased edema, Decreased safety awareness, Decreased knowledge of precautions  Visit Diagnosis: Chronic right shoulder pain  Stiffness of right shoulder, not elsewhere classified  Muscle weakness (generalized)     Problem List Patient Active Problem List   Diagnosis Date Noted  . Complete tear of right rotator cuff 09/07/2019  . Pain in right shoulder 07/27/2019  . Pre-diabetes 12/21/2015  . Abnormal stress test 12/20/2015  . Hypertension   . Coronary artery disease   . Hyperlipemia     Everlean Alstrom. Graylon Good, PT, DPT 12/15/19, 6:23 PM  [facility] [address], [place], [address] Phone: [phone]   Fax:  [phone]  Name: TORY SEPTER MRN: [id number] Date of Birth: 07/01/1941

## 2019-12-21 ENCOUNTER — Encounter: Payer: Medicare Other | Admitting: Physical Therapy

## 2019-12-23 ENCOUNTER — Ambulatory Visit: Payer: Medicare Other | Attending: Orthopaedic Surgery | Admitting: Physical Therapy

## 2019-12-23 ENCOUNTER — Other Ambulatory Visit: Payer: Self-pay

## 2019-12-23 ENCOUNTER — Encounter: Payer: Self-pay | Admitting: Physical Therapy

## 2019-12-23 DIAGNOSIS — G8929 Other chronic pain: Secondary | ICD-10-CM | POA: Diagnosis not present

## 2019-12-23 DIAGNOSIS — M25611 Stiffness of right shoulder, not elsewhere classified: Secondary | ICD-10-CM | POA: Diagnosis not present

## 2019-12-23 DIAGNOSIS — M6281 Muscle weakness (generalized): Secondary | ICD-10-CM | POA: Insufficient documentation

## 2019-12-23 DIAGNOSIS — M25511 Pain in right shoulder: Secondary | ICD-10-CM | POA: Insufficient documentation

## 2019-12-23 NOTE — Therapy (Signed)
[facility] [address], [place], [address] Phone: [phone]   Fax:  [phone]  Physical Therapy Treatment  Patient Details  Name: Kevin Stewart MRN: [id number] Date of Birth: 15-Aug-1940 Referring Provider (PT): Garald Balding, MD   Encounter Date: 12/23/2019  PT End of Session - 12/23/19 1732    Visit Number  28    Number of Visits  43    Date for PT Re-Evaluation  02/09/20    Authorization Type  [employer] medicare reporting period from 11/25/2019    Authorization Time Period  N/A    Authorization - Visit Number  8    Authorization - Number of Visits  10    Progress Note Due on Visit  30   Next FOTO due = 4/26   PT Start Time  1650    PT Stop Time  1730    PT Time Calculation (min)  40 min    Activity Tolerance  Patient tolerated treatment well;No increased pain    Behavior During Therapy  WFL for tasks assessed/performed       Past Medical History:  Diagnosis Date  . Arthritis    "hands" (12/20/2015)  . Basal cell carcinoma, face   . Coronary artery disease    a. 2003 - light MI per patient; s/p Cypher DES to prox LAD in [facility].  Marland Kitchen Hx of adenomatous colonic polyps   . Hyperlipemia   . Hyperlipidemia   . Hypertension   . Kidney stones 1978   "passed them"  . Melanoma of right side of neck ([place])    removed posterior neck 12/312013  . Migraine    "stopped in 2000 when I had my heart attack"  . Myocardial infarction ([place]) 2000  . Pre-diabetes    checks cbg q month  . Rotator cuff tear, right   . Wears dentures    upper-lower partial  . Wears glasses     Past Surgical History:  Procedure Laterality Date  . CARDIAC CATHETERIZATION N/A 12/20/2015   Procedure: Left Heart Cath and Coronary Angiography;  Surgeon: Belva Crome, MD;  Location: [facility];  Service: Cardiovascular;  Laterality: N/A;  . CARDIAC CATHETERIZATION N/A 12/20/2015   Procedure: Coronary Stent  Intervention;  Surgeon: Belva Crome, MD;  Location: [facility];  Service: Cardiovascular;  Laterality: N/A;  . CATARACT EXTRACTION W/ INTRAOCULAR LENS  IMPLANT, BILATERAL Bilateral 2011  . CIRCUMCISION  age 25  . COLONOSCOPY    . CORONARY ANGIOPLASTY WITH STENT PLACEMENT  2000   "1 stent"  . CORONARY ANGIOPLASTY WITH STENT PLACEMENT  12/20/2015   "1 stent"  . CYSTOSCOPY/URETEROSCOPY/HOLMIUM LASER/STENT PLACEMENT Right 08/03/2019   Procedure: CYSTOSCOPY/RETROGRADE/URETEROSCOPY//STENT PLACEMENT;  Surgeon: Ceasar Mons, MD;  Location: [facility];  Service: Urology;  Laterality: Right;  . HEMORRHOID SURGERY     "burned them off"  . MELANOMA EXCISION WITH SENTINEL LYMPH NODE BIOPSY  07/21/2012   Procedure: MELANOMA EXCISION WITH SENTINEL LYMPH NODE BIOPSY;  Surgeon: Rozetta Nunnery, MD;  Location: [facility];  Service: General;  Laterality: N/A;  melanoma excision with sentinel node biopsy   posterior right neck  . TONSILLECTOMY  1961    There were no vitals filed for this visit.  Subjective Assessment - 12/23/19 1731    Subjective  Patient reports he is feeling well. He is sleeping better on his right side and was able to [facility] for  two hours since last session. Has been doing his HEP regularly.    Pertinent History  Patient is a 79 y.o. male who presents to outpatient physical therapy with a referral for medical diagnosis chonic R shoulder pain, nontraumatic complete tear of R rotator cuff. This patient's chief complaints consist of R shoulder pain, stiffness, weakness, surgical precautions s/p R RTC repair 09/02/2019 leading to the following functional deficits: inability to lift or use R UE for activities such as reaching, lifting, carrying, stabilizing, pushing, pulling, grooming, ADLs, IADLs, yardwork, farmwork, community and social activity. Relevant past medical history and comorbidities include hx of MI (2000) with stent placement x2  (2017, 2000), skin cancer, carcinoma, melanoma (removed 6 years ago), pre-diabetes, HTN, arthritis, kidney stones, cataract implants, hx of adenomatous colonic polyps. Denies lung problem, seizures, stroke.    Diagnostic tests  Pre-Op MRI report 08/17/2019: "IMPRESSION:1. Supraspinatus tendinosis with full-thickness non-retracted tearof the anterior tendon fibers in the region of the critical zone.2. Subscapularis tendinosis with partial-thickness tearing.3. Intra-articular biceps tendinosis with partial tearing.4. Glenohumeral and acromioclavicular osteoarthritis."    Patient Stated Goals  get better    Currently in Pain?  No/denies      OBJECTIVE  FOTO =60(11/17/2019)  R shoulder PROM(last measured6/3/21) - flexion: 160*  R shoulder AAROM(on wall) - flexion 146 - scaption146  R shoulder AROM: -Flexion: 140 - Abduction: 132 -FER: T4 - FIR: L2   TREATMENT: R RTC repair 09/02/2019 16weeks(12/23/2019)  Manual therapy:to reduce pain and tissue tension, improve range of motion, neuromodulation, in order to promote improved ability to complete functional activities. - PROM R shoulder flexion. Abduction/scaption, IR and ERx 10-20 each with 5 second hold at end range with gentle OP as tolerated.  - STM to anterior, lateraland posterior R shoulder and upper trap for improved comfort and decreased tension.  Therapeutic exercise:to centralize symptoms and improve ROM, strength, muscular endurance, and activity tolerance required for successful completion of functional activities. - standing AAROM R shoulder wall slide into flexion, scaption, x 20 each. - seated R GHJ ER with elbow resting on bolster at about 70 degrees scaption 3x20 with 4#DB. Strong fatigue last 3 reps.  - suitcase carry with slight shoulder abduction, 5# weight, 2x100 feet.   Pt required multimodal cuing for proper technique and to facilitate improved neuromuscular control, strength, range of  motion, and functional ability resulting in improved performance and form.   HOME EXERCISE PROGRAM Access Code: [email or web address] URL: [email or web address] Date: 11/15/2019 Prepared by: Rosita Kea  Exercises Supine Shoulder Flexion Extension AAROM with Dowel - 1 x daily - 20 reps - 5 seconds hold Supine Shoulder Circles - 1-2 x daily - 2-3 sets - 20 reps Shoulder Flexion Wall Slide with Towel - 1 x daily - 1 sets - 20 reps - 5 seconds hold Row with band/cable - 1-2 x daily - 30 reps Shoulder Internal Rotation with Resistance - 1 x daily - 2 sets - 20 reps - 3 seconds hold Shoulder External Rotation with Anchored Resistance - 1 x daily - 2 sets - 20 reps - 3 seconds hold Standing Single Arm Bicep Curls with Rotation and Dumbbell - 1 x daily - 2-3 sets - 10 reps Standing Shoulder Scaption - 1 x daily - 2-3 sets - 10 reps Seated Shoulder External Rotation in Abduction Supported with Dumbbell - 1 x daily - 2 sets - 20 reps Kettlebell Suitcase Carry - 1-2 x daily - 3 reps - 1 minute walk time   PT Education - 12/23/19  [place]    Education Details  Exercise purpose/form. Self management techniques.    Person(s) Educated  Patient    Methods  Explanation;Demonstration;Tactile cues;Verbal cues    Comprehension  Verbalized understanding;Returned demonstration;Verbal cues required;Tactile cues required       PT Short Term Goals - 09/27/19 1951      PT SHORT TERM GOAL #1   Title  Be independent with initial home exercise program for self-management of symptoms.    Baseline  Initial HEP provided at IE (09/13/2019);    Time  2    Period  Weeks    Status  Achieved    Target Date  09/27/19        PT Long Term Goals - 11/17/19 1432      PT LONG TERM GOAL #1   Title  Be independent with a long-term home exercise program for self-management of symptoms.    Baseline  Initial HEP provided at IE (09/13/2019); currently participating in appropriate HEP (10/18/2019; 11/17/2019);    Time   12    Period  Weeks    Status  Partially Met    Target Date  02/09/20      PT LONG TERM GOAL #2   Title  Demonstrate improved FOTO score to equal or greater than 65 to demonstrate improvement in overall condition and self-reported functional ability.    Baseline  FOTO = 43 (09/13/2019); FOTO = 54 10/18/2019); 60 (11/17/2019);    Time  12    Period  Weeks    Status  Partially Met    Target Date  02/09/20      PT LONG TERM GOAL #3   Title  Have R shoulder AROM equal or greater than L shoulder AROM with no compensations or increase in pain in all planes except intermittent end range discomfort to allow patient to complete valued activities with less difficulty.    Baseline  see objective exam, AROM deferred due to post-op precautions (09/13/2019); able to participate in gentle AROM - see objective (10/18/2019); significant improvement (09/19/2019);    Time  12    Period  Weeks    Status  Partially Met    Target Date  02/09/20      PT LONG TERM GOAL #4   Title  Improve R shoulder strength to 4+/5 for improved ability to allow patient to complete valued functional tasks such as reaching overhead and yardwork with less difficulty.    Baseline  testing deferred due to post-op precautions (09/13/2019); now able to complete AROM in limited range 2/5 (10/18/2019); able to tolerate moderate pressure (11/17/2019);    Time  12    Period  Weeks    Status  Partially Met    Target Date  02/09/20      PT LONG TERM GOAL #5   Title  Complete community, work and/or recreational activities without limitation due to current condition.    Baseline  inability to lift or use R UE for activities such as reaching, lifting, carrying, stabilizing, pushing, pulling, grooming, ADLs, IADLs, yardwork, farmwork, community and social activity (09/13/2019); able to tie shows, improved gromming, ADLs, slight reaching and light carrying (10/18/2019); improving (11/17/2019);    Time  12    Period  Weeks    Status  Partially Met     Target Date  02/09/20            Plan - 12/23/19 1730    Clinical Impression Statement  Patient tolerated treatment well and was able  to increase weight and refine technique for HEP. He did need some cuing to correct improper form for two newest exercises. Demonstrated improved understanding and form by end of session. Patient would benefit from continued management of limiting condition by skilled physical therapist to address remaining impairments and functional limitations to work towards stated goals and return to PLOF or maximal functional independence.    Personal Factors and Comorbidities  Age;Behavior Pattern;Comorbidity 3+;Education;Social Background;Time since onset of injury/illness/exacerbation;Past/Current Experience;Fitness    Comorbidities  hx of MI (2000) with stent placement x2 (2017, 2000), skin cancer, carcinoma, melanoma (removed 6 years ago), pre-diabetes, HTN, arthritis, kidney stones, cataract implants, hx of adenomatous colonic polyps. Denies lung problem, seizures, stroke    Examination-Activity Limitations  Bed Mobility;Bathing;Reach Overhead;Self Feeding;Carry;Dressing;Hygiene/Grooming;Sleep;Lift;Toileting    Examination-Participation Restrictions  Driving;Community Activity;Cleaning;Yard Work;Meal Prep;Other    Stability/Clinical Decision Making  Stable/Uncomplicated    Rehab Potential  Good    PT Frequency  2x / week    PT Duration  12 weeks    PT Treatment/Interventions  ADLs/Self Care Home Management;Aquatic Therapy;Electrical Stimulation;Cryotherapy;Moist Heat;DME Instruction;Therapeutic activities;Therapeutic exercise;Balance training;Neuromuscular re-education;Patient/family education;Manual techniques;Compression bandaging;Scar mobilization;Dry needling;Passive range of motion;Joint Manipulations;Spinal Manipulations    PT Next Visit Plan  Exercises/manual for improved ROM, progressive shoulder girdle strengthening, stability, and functional activities as  tolerated.    PT Home Exercise Plan  Medbridge Access Code: [email or web address]    Consulted and Agree with Plan of Care  Patient       Patient will benefit from skilled therapeutic intervention in order to improve the following deficits and impairments:  Decreased knowledge of use of DME, Decreased skin integrity, Increased fascial restricitons, Pain, Increased muscle spasms, Decreased scar mobility, Decreased coordination, Decreased activity tolerance, Decreased endurance, Decreased range of motion, Decreased strength, Hypomobility, Impaired perceived functional ability, Impaired UE functional use, Impaired flexibility, Increased edema, Decreased safety awareness, Decreased knowledge of precautions  Visit Diagnosis: Chronic right shoulder pain  Stiffness of right shoulder, not elsewhere classified  Muscle weakness (generalized)     Problem List Patient Active Problem List   Diagnosis Date Noted  . Complete tear of right rotator cuff 09/07/2019  . Pain in right shoulder 07/27/2019  . Pre-diabetes 12/21/2015  . Abnormal stress test 12/20/2015  . Hypertension   . Coronary artery disease   . Hyperlipemia     Everlean Alstrom. Graylon Good, PT, DPT 12/23/19, 5:33 PM  [facility] [address] [place], [address] Phone: [phone]   Fax:  [phone]  Name: Kevin Stewart MRN: [id number] Date of Birth: Sep 30, 1940

## 2019-12-28 ENCOUNTER — Ambulatory Visit: Payer: Medicare Other | Admitting: Physical Therapy

## 2019-12-28 ENCOUNTER — Other Ambulatory Visit: Payer: Self-pay

## 2019-12-28 DIAGNOSIS — M25511 Pain in right shoulder: Secondary | ICD-10-CM | POA: Diagnosis not present

## 2019-12-28 DIAGNOSIS — M25611 Stiffness of right shoulder, not elsewhere classified: Secondary | ICD-10-CM | POA: Diagnosis not present

## 2019-12-28 DIAGNOSIS — M6281 Muscle weakness (generalized): Secondary | ICD-10-CM

## 2019-12-28 DIAGNOSIS — G8929 Other chronic pain: Secondary | ICD-10-CM | POA: Diagnosis not present

## 2019-12-28 NOTE — Therapy (Signed)
[facility] [address], [place], [address] Phone: [phone]   Fax:  [phone]  Physical Therapy Treatment / Progress Note Reporting Period: 11/25/2019 - 12/28/2019  Patient Details  Name: Kevin Stewart MRN: [id number] Date of Birth: 01/11/1941 Referring Provider (PT): Garald Balding, MD   Encounter Date: 12/28/2019  PT End of Session - 12/29/19 1138    Visit Number  29    Number of Visits  43    Date for PT Re-Evaluation  02/09/20    Authorization Type  [employer] medicare reporting period from 11/25/2019    Authorization Time Period  N/A    Authorization - Visit Number  9    Authorization - Number of Visits  10    Progress Note Due on Visit  30   Next FOTO due = 4/26   PT Start Time  1120    PT Stop Time  1200    PT Time Calculation (min)  40 min    Activity Tolerance  Patient tolerated treatment well;No increased pain    Behavior During Therapy  WFL for tasks assessed/performed       Past Medical History:  Diagnosis Date  . Arthritis    "hands" (12/20/2015)  . Basal cell carcinoma, face   . Coronary artery disease    a. 2003 - light MI per patient; s/p Cypher DES to prox LAD in [facility].  Marland Kitchen Hx of adenomatous colonic polyps   . Hyperlipemia   . Hyperlipidemia   . Hypertension   . Kidney stones 1978   "passed them"  . Melanoma of right side of neck ([place])    removed posterior neck 12/312013  . Migraine    "stopped in 2000 when I had my heart attack"  . Myocardial infarction ([place]) 2000  . Pre-diabetes    checks cbg q month  . Rotator cuff tear, right   . Wears dentures    upper-lower partial  . Wears glasses     Past Surgical History:  Procedure Laterality Date  . CARDIAC CATHETERIZATION N/A 12/20/2015   Procedure: Left Heart Cath and Coronary Angiography;  Surgeon: Belva Crome, MD;  Location: [facility];  Service: Cardiovascular;  Laterality: N/A;  . CARDIAC CATHETERIZATION  N/A 12/20/2015   Procedure: Coronary Stent Intervention;  Surgeon: Belva Crome, MD;  Location: [facility];  Service: Cardiovascular;  Laterality: N/A;  . CATARACT EXTRACTION W/ INTRAOCULAR LENS  IMPLANT, BILATERAL Bilateral 2011  . CIRCUMCISION  age 45  . COLONOSCOPY    . CORONARY ANGIOPLASTY WITH STENT PLACEMENT  2000   "1 stent"  . CORONARY ANGIOPLASTY WITH STENT PLACEMENT  12/20/2015   "1 stent"  . CYSTOSCOPY/URETEROSCOPY/HOLMIUM LASER/STENT PLACEMENT Right 08/03/2019   Procedure: CYSTOSCOPY/RETROGRADE/URETEROSCOPY//STENT PLACEMENT;  Surgeon: Ceasar Mons, MD;  Location: [facility];  Service: Urology;  Laterality: Right;  . HEMORRHOID SURGERY     "burned them off"  . MELANOMA EXCISION WITH SENTINEL LYMPH NODE BIOPSY  07/21/2012   Procedure: MELANOMA EXCISION WITH SENTINEL LYMPH NODE BIOPSY;  Surgeon: Rozetta Nunnery, MD;  Location: [facility];  Service: General;  Laterality: N/A;  melanoma excision with sentinel node biopsy   posterior right neck  . TONSILLECTOMY  1961   Subjective Assessment - 12/28/19 1143    Subjective  Patieint has avoided shoveling, painting overhead, picking up heavy things (more than 20#). He has specificially really avoided picking up or pulling  on heavy things including his exercise machine.  He has started pulling himself up to the tracktor some, has picked up a 10LB DB at his side, hoeing, pruining tree (with modified motion).    Pertinent History  Patient is a 79 y.o. male who presents to outpatient physical therapy with a referral for medical diagnosis chonic R shoulder pain, nontraumatic complete tear of R rotator cuff. This patient's chief complaints consist of R shoulder pain, stiffness, weakness, surgical precautions s/p R RTC repair 09/02/2019 leading to the following functional deficits: inability to lift or use R UE for activities such as reaching, lifting, carrying, stabilizing, pushing, pulling,  grooming, ADLs, IADLs, yardwork, farmwork, community and social activity. Relevant past medical history and comorbidities include hx of MI (2000) with stent placement x2 (2017, 2000), skin cancer, carcinoma, melanoma (removed 6 years ago), pre-diabetes, HTN, arthritis, kidney stones, cataract implants, hx of adenomatous colonic polyps. Denies lung problem, seizures, stroke.    Diagnostic tests  Pre-Op MRI report 08/17/2019: "IMPRESSION:1. Supraspinatus tendinosis with full-thickness non-retracted tearof the anterior tendon fibers in the region of the critical zone.2. Subscapularis tendinosis with partial-thickness tearing.3. Intra-articular biceps tendinosis with partial tearing.4. Glenohumeral and acromioclavicular osteoarthritis."    Patient Stated Goals  get better    Currently in Pain?  No/denies       There were no vitals filed for this visit.   OBJECTIVE  FOTO =60(11/17/2019)  RANGE OF MOTION  R shoulder PROM(last measured6/8/21) - flexion: 160*  R shoulder AAROM(on wall) - flexion 152 - scaption152  R shoulder AROM: -Flexion: 140 - Abduction: 140 (slight scaption) -FER: T4 - FIR: L2  STRENGTH:  Shoulder  - Flexion: R = 4+/5, L = 5/5.  - Abduction: R = 4/5, L = 5/5. - External rotation: R = 4+/5, L = 4/5. - Internal rotation: R = 4+/5, L = 5/5. Elbow - Flexion: R = 4+/5, L = 5/5. - Extension: R = 5/5, L = 5/5. Grip strength (in pounds, average of three measures).  - R: (54+60+59)/3 = 58 - L: (83+88+92)/ 3 = 88  TREATMENT: R RTC repair 09/02/2019 16weeks, 5 days(12/28/2019)  Manual therapy:to reduce pain and tissue tension, improve range of motion, neuromodulation, in order to promote improved ability to complete functional activities. - PROM R shoulder flexion. Abduction/scaption,IR and ERx 10-20 each with 5 second hold at end range with gentle OP as tolerated.  - STM to anterior, lateraland posterior R shoulder and upper trap for improved  comfort and decreased tension.  Therapeutic exercise:to centralize symptoms and improve ROM, strength, muscular endurance, and activity tolerance required for successful completion of functional activities. - standing AAROM R shoulder wall slide into flexion, scaption, x 20 each. - bicep curl with increasing resistance: x 10 at 6#, x10 at 10#, x3 at 15# (very difficult with a bit of "stinging") - floor to waist box lift: x1 at 20 lbs, x2 at 30#.  - waist to chest and slight push out box lift x 1 at 20# (difficult but able).  - carried 20@ kettelbell and reached and grabbed it with R UE in slightly abducted position.  - B scaption with 3# DB x 10. - discussion of questions to ask physician at upcoming appointment and discharge planning. Education on safely progressing activity.   Pt required multimodal cuing for proper technique and to facilitate improved neuromuscular control, strength, range of motion, and functional ability resulting in improved performance and form.   HOME EXERCISE PROGRAM Access Code: [email or web address] URL: [email or web address] Date:  11/15/2019 Prepared by: Rosita Kea  Exercises Supine Shoulder Flexion Extension AAROM with Dowel - 1 x daily - 20 reps - 5 seconds hold Supine Shoulder Circles - 1-2 x daily - 2-3 sets - 20 reps Shoulder Flexion Wall Slide with Towel - 1 x daily - 1 sets - 20 reps - 5 seconds hold Row with band/cable - 1-2 x daily - 30 reps Shoulder Internal Rotation with Resistance - 1 x daily - 2 sets - 20 reps - 3 seconds hold Shoulder External Rotation with Anchored Resistance - 1 x daily - 2 sets - 20 reps - 3 seconds hold Standing Single Arm Bicep Curls with Rotation and Dumbbell - 1 x daily - 2-3 sets - 10 reps Standing Shoulder Scaption - 1 x daily - 2-3 sets - 10 reps Seated Shoulder External Rotation in Abduction Supported with Dumbbell - 1 x daily - 2 sets - 20 reps Kettlebell Suitcase Carry - 1-2 x daily - 3 reps - 1 minute  walk time   PT Education - 12/29/19 1137    Education Details  Exercise purpose/form. Self management techniques. POC, progress. safe progression of activities.    Person(s) Educated  Patient    Methods  Explanation;Demonstration;Tactile cues;Verbal cues    Comprehension  Verbalized understanding;Returned demonstration;Verbal cues required;Tactile cues required;Need further instruction       PT Short Term Goals - 09/27/19 1951      PT SHORT TERM GOAL #1   Title  Be independent with initial home exercise program for self-management of symptoms.    Baseline  Initial HEP provided at IE (09/13/2019);    Time  2    Period  Weeks    Status  Achieved    Target Date  09/27/19        PT Long Term Goals - 12/29/19 1139      PT LONG TERM GOAL #1   Title  Be independent with a long-term home exercise program for self-management of symptoms.    Baseline  Initial HEP provided at IE (09/13/2019); currently participating in appropriate HEP (10/18/2019; 11/17/2019); currently participating in appropriat HEP, demonstrates/reports good self-management for gradually increasing load/activity (12/28/2019);    Time  12    Period  Weeks    Status  Partially Met    Target Date  02/09/20      PT LONG TERM GOAL #2   Title  Demonstrate improved FOTO score to equal or greater than 65 to demonstrate improvement in overall condition and self-reported functional ability.    Baseline  FOTO = 43 (09/13/2019); FOTO = 54 10/18/2019); 60 (11/17/2019);    Time  12    Period  Weeks    Status  Partially Met    Target Date  02/09/20      PT LONG TERM GOAL #3   Title  Have R shoulder AROM equal or greater than L shoulder AROM with no compensations or increase in pain in all planes except intermittent end range discomfort to allow patient to complete valued activities with less difficulty.    Baseline  see objective exam, AROM deferred due to post-op precautions (09/13/2019); able to participate in gentle AROM - see  objective (10/18/2019); significant improvement (09/19/2019); very close - see objective exam - limitation in IR still (12/28/2019);    Time  12    Period  Weeks    Status  Partially Met    Target Date  02/09/20      PT LONG TERM GOAL #4  Title  Improve R shoulder strength to 4+/5 for improved ability to allow patient to complete valued functional tasks such as reaching overhead and yardwork with less difficulty.    Baseline  testing deferred due to post-op precautions (09/13/2019); now able to complete AROM in limited range 2/5 (10/18/2019); able to tolerate moderate pressure (11/17/2019); nearly met - 4/5 in R shoulder abduction  (12/28/2019);    Time  12    Period  Weeks    Status  Partially Met    Target Date  02/09/20      PT LONG TERM GOAL #5   Title  Complete community, work and/or recreational activities without limitation due to current condition.    Baseline  inability to lift or use R UE for activities such as reaching, lifting, carrying, stabilizing, pushing, pulling, grooming, ADLs, IADLs, yardwork, farmwork, community and social activity (09/13/2019); able to tie shows, improved gromming, ADLs, slight reaching and light carrying (10/18/2019); improving (11/17/2019); greatly improved - has not yet returned to painting overhead, shoveling, picking up heavy things, using his exercise machine, pulling himself up on a tractor (12/28/2019);    Time  12    Period  Weeks    Status  Partially Met    Target Date  02/09/20            Plan - 12/29/19 1154    Clinical Impression Statement  Patient has attended 29 physical therapy sessions and has continued to make steady progress towards goals. In the last two weeks, patient has successfully transitioned to once a week sessions to advance HEP and address concerns. He has demonstrated several instances of good judgement in independently progressing exercises and activity level and demonstrates good form in clinic when demonstrating exercises. He has  not yet returned to some of his more difficult usual activities and is apprehensive about lifting and benefits from guidance on readiness to go back to more strenuous activities. Patient appears ready to progress steadily towards these activities but has been recommended to discuss this with surgeon at upcoming follow up visit. Patient will benefit from further PT sessions once he has updated restrictions from surgeon to allow him to practice in a controlled environment to improve confidence in his abilities to select appropriate activities prior to discharge.  Patient would benefit from continued management of limiting condition by skilled physical therapist to address remaining impairments and functional limitations to work towards stated goals and return to PLOF or maximal functional independence.    Personal Factors and Comorbidities  Age;Behavior Pattern;Comorbidity 3+;Education;Social Background;Time since onset of injury/illness/exacerbation;Past/Current Experience;Fitness    Comorbidities  hx of MI (2000) with stent placement x2 (2017, 2000), skin cancer, carcinoma, melanoma (removed 6 years ago), pre-diabetes, HTN, arthritis, kidney stones, cataract implants, hx of adenomatous colonic polyps. Denies lung problem, seizures, stroke    Examination-Activity Limitations  Bed Mobility;Bathing;Reach Overhead;Self Feeding;Carry;Dressing;Hygiene/Grooming;Sleep;Lift;Toileting    Examination-Participation Restrictions  Driving;Community Activity;Cleaning;Yard Work;Meal Prep;Other    Stability/Clinical Decision Making  Stable/Uncomplicated    Rehab Potential  Good    PT Frequency  2x / week    PT Duration  12 weeks    PT Treatment/Interventions  ADLs/Self Care Home Management;Aquatic Therapy;Electrical Stimulation;Cryotherapy;Moist Heat;DME Instruction;Therapeutic activities;Therapeutic exercise;Balance training;Neuromuscular re-education;Patient/family education;Manual techniques;Compression bandaging;Scar  mobilization;Dry needling;Passive range of motion;Joint Manipulations;Spinal Manipulations    PT Next Visit Plan  Exercises/manual for improved ROM, progressive shoulder girdle strengthening, stability, and functional activities as tolerated.    PT Home Exercise Plan  Medbridge Access Code: [email or web address]    CHYIFOYDX  and Agree with Plan of Care  Patient       Patient will benefit from skilled therapeutic intervention in order to improve the following deficits and impairments:  Decreased knowledge of use of DME, Decreased skin integrity, Increased fascial restricitons, Pain, Increased muscle spasms, Decreased scar mobility, Decreased coordination, Decreased activity tolerance, Decreased endurance, Decreased range of motion, Decreased strength, Hypomobility, Impaired perceived functional ability, Impaired UE functional use, Impaired flexibility, Increased edema, Decreased safety awareness, Decreased knowledge of precautions  Visit Diagnosis: Chronic right shoulder pain  Stiffness of right shoulder, not elsewhere classified  Muscle weakness (generalized)     Problem List Patient Active Problem List   Diagnosis Date Noted  . Complete tear of right rotator cuff 09/07/2019  . Pain in right shoulder 07/27/2019  . Pre-diabetes 12/21/2015  . Abnormal stress test 12/20/2015  . Hypertension   . Coronary artery disease   . Hyperlipemia     Everlean Alstrom. Graylon Good, PT, DPT 12/29/19, 11:57 AM  [facility] [address], [place], [address] Phone: [phone]   Fax:  [phone]  Name: Kevin Stewart MRN: [id number] Date of Birth: 1940-09-25

## 2019-12-29 ENCOUNTER — Encounter: Payer: Self-pay | Admitting: Physical Therapy

## 2020-01-04 ENCOUNTER — Encounter: Payer: Medicare Other | Admitting: Physical Therapy

## 2020-01-04 ENCOUNTER — Telehealth: Payer: Self-pay | Admitting: Cardiology

## 2020-01-04 NOTE — Telephone Encounter (Signed)
    Pt is asking about meds, he doesn't know the name he said he will callback to give the name of the meds

## 2020-01-06 ENCOUNTER — Encounter: Payer: Self-pay | Admitting: Orthopaedic Surgery

## 2020-01-06 ENCOUNTER — Encounter: Payer: Medicare Other | Admitting: Physical Therapy

## 2020-01-06 ENCOUNTER — Other Ambulatory Visit: Payer: Self-pay

## 2020-01-06 ENCOUNTER — Ambulatory Visit: Payer: Medicare Other | Admitting: Orthopaedic Surgery

## 2020-01-06 VITALS — Ht 71.0 in | Wt 165.0 lb

## 2020-01-06 DIAGNOSIS — M75121 Complete rotator cuff tear or rupture of right shoulder, not specified as traumatic: Secondary | ICD-10-CM

## 2020-01-06 NOTE — Progress Notes (Signed)
Office Visit Note   Patient: Kevin Stewart           Date of Birth: January 02, 1941           MRN: [id number] Visit Date: 01/06/2020              Requested by: [name], NP [address] [place],   [address] PCP: [name], NP   Assessment & Plan: Visit Diagnoses:  1. Nontraumatic complete tear of right rotator cuff     Plan: Mr. Voght is just over 4 months status post rotator cuff tear repair of his right shoulder involving both supra and infraspinatus tendons as well as a biceps tenodesis.  He is doing very well and does not have any the pain that he had preoperatively.  He has several sessions of therapy remaining but does have a home exercise program.  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  He is not having any trouble sleeping.  He is very happy with his results.  I have encouraged him to be careful and return to see me as needed.  He continue working on strengthening exercises.  I have discussed the potential of recurrent tear  Follow-Up Instructions: Return if symptoms worsen or fail to improve.   Orders:  No orders of the defined types were placed in this encounter.  No orders of the defined types were placed in this encounter.     Procedures: No procedures performed   Clinical Data: No additional findings.   Subjective: Chief Complaint  Patient presents with  . Right Shoulder - Follow-up    Right shoulder scope DOS 09-02-2019  Patient presents today for follow up on his right shoulder. He had a rotator cuff tear repair on 09-02-2019. He is now 4 months out from surgery. He states that he is doing well. He is still in therapy but only has two more visits scheduled.  HPI  Review of Systems   Objective: Vital Signs: Ht 5\' 11"  (1.803 m)   Wt 165 lb (74.8 kg)   BMI 23.01 kg/m   Physical Exam Constitutional:      Appearance: He is well-developed.  Eyes:     Pupils: Pupils are equal, round, and reactive to light.    Pulmonary:     Effort: Pulmonary effort is normal.  Skin:    General: Skin is warm and dry.  Neurological:     Mental Status: He is alert and oriented to person, place, and time.  Psychiatric:        Behavior: Behavior normal.     Ortho Exam able to place right arm fully overhead without any discomfort or circuitous motion.  Able to abduct beyond 90 degrees.  Good strength.  Incisions of healed without problem.  Biceps in good position without any pain along its tendon.  No evidence of adhesive capsulitis and negative impingement  Specialty Comments:  No specialty comments available.  Imaging: No results found.   PMFS History: Patient Active Problem List   Diagnosis Date Noted  . Complete tear of right rotator cuff 09/07/2019  . Pain in right shoulder 07/27/2019  . Pre-diabetes 12/21/2015  . Abnormal stress test 12/20/2015  . Hypertension   . Coronary artery disease   . Hyperlipemia    Past Medical History:  Diagnosis Date  . Arthritis    "hands" (12/20/2015)  . Basal cell carcinoma, face   . Coronary artery disease  a. 2003 - light MI per patient; s/p Cypher DES to prox LAD in [facility]  Marland Kitchen Hx of adenomatous colonic polyps   . Hyperlipemia   . Hyperlipidemia   . Hypertension   . Kidney stones 1978   "passed them"  . Melanoma of right side of neck ([place])    removed posterior neck 12/312013  . Migraine    "stopped in 2000 when I had my heart attack"  . Myocardial infarction ([place]) 2000  . Pre-diabetes    checks cbg q month  . Rotator cuff tear, right   . Wears dentures    upper-lower partial  . Wears glasses     Family History  Problem Relation Age of Onset  . Heart attack Mother 9  . Cancer - Other Father 24       pancreatic  . Heart attack Brother 37  . Cancer - Colon Brother 70  . Cancer - Other Sister 18    Past Surgical History:  Procedure Laterality Date  . CARDIAC CATHETERIZATION N/A 12/20/2015   Procedure: Left Heart Cath and Coronary  Angiography;  Surgeon: Belva Crome, MD;  Location: [facility];  Service: Cardiovascular;  Laterality: N/A;  . CARDIAC CATHETERIZATION N/A 12/20/2015   Procedure: Coronary Stent Intervention;  Surgeon: Belva Crome, MD;  Location: [facility];  Service: Cardiovascular;  Laterality: N/A;  . CATARACT EXTRACTION W/ INTRAOCULAR LENS  IMPLANT, BILATERAL Bilateral 2011  . CIRCUMCISION  age 24  . COLONOSCOPY    . CORONARY ANGIOPLASTY WITH STENT PLACEMENT  2000   "1 stent"  . CORONARY ANGIOPLASTY WITH STENT PLACEMENT  12/20/2015   "1 stent"  . CYSTOSCOPY/URETEROSCOPY/HOLMIUM LASER/STENT PLACEMENT Right 08/03/2019   Procedure: CYSTOSCOPY/RETROGRADE/URETEROSCOPY//STENT PLACEMENT;  Surgeon: Ceasar Mons, MD;  Location: [facility];  Service: Urology;  Laterality: Right;  . HEMORRHOID SURGERY     "burned them off"  . MELANOMA EXCISION WITH SENTINEL LYMPH NODE BIOPSY  07/21/2012   Procedure: MELANOMA EXCISION WITH SENTINEL LYMPH NODE BIOPSY;  Surgeon: Rozetta Nunnery, MD;  Location: [facility];  Service: General;  Laterality: N/A;  melanoma excision with sentinel node biopsy   posterior right neck  . TONSILLECTOMY  1961   Social History   Occupational History  . 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 Smoking status: Former Smoker    Packs/day: 2.00    Years: 10.00    Pack years: 20.00    Types: Cigarettes    Quit date: 07/10/1973    Years since quitting: 46.5  . Smokeless tobacco: Former Systems developer    Types: Chew  . Tobacco comment: "chewed a little; stopped in 06/1973"  Vaping Use  . Vaping Use: Never used  Substance and Sexual Activity  . Alcohol use: Yes    Comment: 12/20/2015 'quit in ~ 2012-2013"  . Drug use: No  . Sexual activity: Never

## 2020-01-11 ENCOUNTER — Other Ambulatory Visit: Payer: Self-pay

## 2020-01-11 ENCOUNTER — Ambulatory Visit: Payer: Medicare Other | Admitting: Physical Therapy

## 2020-01-11 ENCOUNTER — Encounter: Payer: Self-pay | Admitting: Physical Therapy

## 2020-01-11 DIAGNOSIS — M25611 Stiffness of right shoulder, not elsewhere classified: Secondary | ICD-10-CM

## 2020-01-11 DIAGNOSIS — G8929 Other chronic pain: Secondary | ICD-10-CM | POA: Diagnosis not present

## 2020-01-11 DIAGNOSIS — M25511 Pain in right shoulder: Secondary | ICD-10-CM

## 2020-01-11 DIAGNOSIS — M6281 Muscle weakness (generalized): Secondary | ICD-10-CM | POA: Diagnosis not present

## 2020-01-11 NOTE — Therapy (Signed)
[facility] [address], [place], [address] Phone: [phone]   Fax:  [phone]  Physical Therapy Treatment / Progress Note Reporting Period: 11/25/2019 - 01/11/2020  Patient Details  Name: Kevin Stewart MRN: [id number] Date of Birth: 05/03/1941 Referring Provider (PT): Garald Balding, MD   Encounter Date: 01/11/2020   PT End of Session - 01/11/20 1128    Visit Number 30    Number of Visits 43    Date for PT Re-Evaluation 02/09/20    Authorization Type [employer] medicare reporting period from 11/25/2019    Authorization Time Period N/A    Authorization - Visit Number 10    Authorization - Number of Visits 10    Progress Note Due on Visit 30   Next FOTO due = 4/26   PT Start Time 1120    PT Stop Time 1200    PT Time Calculation (min) 40 min    Activity Tolerance Patient tolerated treatment well;No increased pain    Behavior During Therapy WFL for tasks assessed/performed           Past Medical History:  Diagnosis Date  . Arthritis    "hands" (12/20/2015)  . Basal cell carcinoma, face   . Coronary artery disease    a. 2003 - light MI per patient; s/p Cypher DES to prox LAD in [facility].  Marland Kitchen Hx of adenomatous colonic polyps   . Hyperlipemia   . Hyperlipidemia   . Hypertension   . Kidney stones 1978   "passed them"  . Melanoma of right side of neck ([place])    removed posterior neck 12/312013  . Migraine    "stopped in 2000 when I had my heart attack"  . Myocardial infarction ([place]) 2000  . Pre-diabetes    checks cbg q month  . Rotator cuff tear, right   . Wears dentures    upper-lower partial  . Wears glasses     Past Surgical History:  Procedure Laterality Date  . CARDIAC CATHETERIZATION N/A 12/20/2015   Procedure: Left Heart Cath and Coronary Angiography;  Surgeon: Belva Crome, MD;  Location: [facility];  Service: Cardiovascular;  Laterality: N/A;  . CARDIAC CATHETERIZATION N/A  12/20/2015   Procedure: Coronary Stent Intervention;  Surgeon: Belva Crome, MD;  Location: [facility];  Service: Cardiovascular;  Laterality: N/A;  . CATARACT EXTRACTION W/ INTRAOCULAR LENS  IMPLANT, BILATERAL Bilateral 2011  . CIRCUMCISION  age 13  . COLONOSCOPY    . CORONARY ANGIOPLASTY WITH STENT PLACEMENT  2000   "1 stent"  . CORONARY ANGIOPLASTY WITH STENT PLACEMENT  12/20/2015   "1 stent"  . CYSTOSCOPY/URETEROSCOPY/HOLMIUM LASER/STENT PLACEMENT Right 08/03/2019   Procedure: CYSTOSCOPY/RETROGRADE/URETEROSCOPY//STENT PLACEMENT;  Surgeon: Ceasar Mons, MD;  Location: [facility];  Service: Urology;  Laterality: Right;  . HEMORRHOID SURGERY     "burned them off"  . MELANOMA EXCISION WITH SENTINEL LYMPH NODE BIOPSY  07/21/2012   Procedure: MELANOMA EXCISION WITH SENTINEL LYMPH NODE BIOPSY;  Surgeon: Rozetta Nunnery, MD;  Location: [facility];  Service: General;  Laterality: N/A;  melanoma excision with sentinel node biopsy   posterior right neck  . TONSILLECTOMY  1961    There were no vitals filed for this visit.   Subjective Assessment - 01/11/20 1129    Subjective Patient reports he is doing well. He did some hoeing in his garden and it makes his shoulder a bit  sore. 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 States his surgeon turned him loose unless he needs more assistance. Gave him a 25# lifting limit but said to be careful with it and keep it close to the body. He feels the IR and ER exercise is getting a bit easy.    Pertinent History Patient is a 79 y.o. male who presents to outpatient physical therapy with a referral for medical diagnosis chonic R shoulder pain, nontraumatic complete tear of R rotator cuff. This patient's chief complaints consist of R shoulder pain, stiffness, weakness, surgical precautions s/p R RTC repair 09/02/2019  leading to the following functional deficits: inability to lift or use R UE for activities such as reaching, lifting, carrying, stabilizing, pushing, pulling, grooming, ADLs, IADLs, yardwork, farmwork, community and social activity. Relevant past medical history and comorbidities include hx of MI (2000) with stent placement x2 (2017, 2000), skin cancer, carcinoma, melanoma (removed 6 years ago), pre-diabetes, HTN, arthritis, kidney stones, cataract implants, hx of adenomatous colonic polyps. Denies lung problem, seizures, stroke.    Diagnostic tests Pre-Op MRI report 08/17/2019: "IMPRESSION:1. Supraspinatus tendinosis with full-thickness non-retracted tearof the anterior tendon fibers in the region of the critical zone.2. Subscapularis tendinosis with partial-thickness tearing.3. Intra-articular biceps tendinosis with partial tearing.4. Glenohumeral and acromioclavicular osteoarthritis."    Patient Stated Goals get better    Currently in Pain? No/denies   soreness only          OBJECTIVE  FOTO =60(11/17/2019)  RANGE OF MOTION  R shoulder PROM(last measured6/8/21) - flexion: 160* - abduction: 180* - ER: 90* - IR: 40* (isolated).   R shoulder AAROM(on wall) - flexion 153 - scaption152  R shoulder AROM: -Flexion: 145 - Abduction: 146 (slight scaption) -FER: T4-5 - FIR: L1   STRENGTH (last measured 12/28/2019):  Shoulder            Flexion: R = 4+/5, L = 5/5.   Abduction: R = 4/5, L = 5/5.  External rotation: R = 4+/5, L = 4/5.  Internal rotation: R = 4+/5, L = 5/5. Elbow  Flexion: R = 4+/5, L = 5/5.  Extension: R = 5/5, L = 5/5. Grip strength (in pounds, average of three measures).   R: (54+60+59)/3 = 58  L: (83+88+92)/ 3 = 88  TREATMENT: R RTC repair 09/02/2019 18weeks, 5 days(01/11/2020)  Manual therapy:to reduce pain and tissue tension, improve range of motion, neuromodulation, in order to promote improved ability to complete functional  activities. - PROM R shoulder flexion. Abduction/scaption,IR and ERx 5-10 each with 5 second hold at end range with gentle OP as tolerated.  - STM to anterior, lateraland posterior R shoulderand upper trapfor improved comfort and decreased tension.  Therapeutic exercise:to centralize symptoms and improve ROM, strength, muscular endurance, and activity tolerance required for successful completion of functional activities. - standing AAROM R shoulder wall slide into flexion, scaption, x 20 each. Updated to include using top of door to get more motion.  - measurements to assess progress (see above).  - banded R shoulder ER at 80 degrees abduction x 10 with green theraband then x 10 with red theraband. Struggles with form.  - seated R shoulder ER against 5# DB with elbow supported on elevated plinth to about 85 degrees in scaption, 2x20 with cuing for improved form.  - R shoulder overhead press x 5 with 3# DB, bothersome.  -  waiter's cary (overhead carry), 3x20 seconds with cuing on how to move weight up and down from that position. Well tolerated.  - trials of various abduction holds for R shoulder at 2-5# weight per patient preference while educating patient on appropriateness of the exercises he wanted to perform.  - Education on HEP including handout and updates to progress exercises.  - education on progress and upcoming visit schedule to prepare for discharge to independent management.    Pt required multimodal cuing for proper technique and to facilitate improved neuromuscular control, strength, range of motion, and functional ability resulting in improved performance and form.   HOME EXERCISE PROGRAM Access Code: [email or web address] URL: [email or web address] Date: 01/11/2020 Prepared by: Rosita Kea  Exercises Supine Shoulder Flexion Extension AAROM with Dowel - 1 x daily - 20 reps - 5 seconds hold Supine Shoulder Circles - 1-2 x daily - 2-3 sets - 20 reps Shoulder  Flexion Wall Slide with Towel - 1 x daily - 1 sets - 20 reps - 5 seconds hold Row with band/cable - 1-2 x daily - 30 reps Shoulder Internal Rotation with Resistance - 1 x daily - 2 sets - 20 reps - 3 seconds hold Shoulder External Rotation with Anchored Resistance - 1 x daily - 2 sets - 20 reps - 3 seconds hold Standing Single Arm Bicep Curls with Rotation and Dumbbell - 1 x daily - 2-3 sets - 10 reps Standing Shoulder Scaption - 1 x daily - 2-3 sets - 10 reps Seated Shoulder External Rotation in Abduction Supported with Dumbbell - 1 x daily - 2 sets - 20 reps Kettlebell Suitcase Carry - 1-2 x daily - 3 reps - 1 minute walk time Seated Shoulder External Rotation in Abduction Supported with Dumbbell - 1 x daily - 2 sets - 20 reps Suitcase and Waiter's Carry with Kettlebells - 1-2 x daily - 3 reps - 20 seconds hold - 20 seconds walk time    PT Short Term Goals - 09/27/19 1951      PT SHORT TERM GOAL #1   Title Be independent with initial home exercise program for self-management of symptoms.    Baseline Initial HEP provided at IE (09/13/2019);    Time 2    Period Weeks    Status Achieved    Target Date 09/27/19             PT Long Term Goals - 01/11/20 1314      PT LONG TERM GOAL #1   Title Be independent with a long-term home exercise program for self-management of symptoms.    Baseline Initial HEP provided at IE (09/13/2019); currently participating in appropriate HEP (10/18/2019; 11/17/2019); currently participating in appropriat HEP, demonstrates/reports good self-management for gradually increasing load/activity (12/28/2019);    Time 12    Period Weeks    Status Partially Met    Target Date --   TARGET DATE FOR ALL LONG TERM GOALS: 02/09/2020     PT LONG TERM GOAL #2   Title Demonstrate improved FOTO score to equal or greater than 65 to demonstrate improvement in overall condition and self-reported functional ability.    Baseline FOTO = 43 (09/13/2019); FOTO = 54 10/18/2019); 60  (11/17/2019);    Time 12    Period Weeks    Status Partially Met      PT LONG TERM GOAL #3   Title Have R shoulder AROM equal or greater than L shoulder AROM with no compensations or increase in pain in all planes  except intermittent end range discomfort to allow patient to complete valued activities with less difficulty.    Baseline see objective exam, AROM deferred due to post-op precautions (09/13/2019); able to participate in gentle AROM - see objective (10/18/2019); significant improvement (09/19/2019); very close - see objective exam - limitation in IR still (12/28/2019); continues to improve (01/11/2020);    Time 12    Period Weeks    Status Partially Met      PT LONG TERM GOAL #4   Title Improve R shoulder strength to 4+/5 for improved ability to allow patient to complete valued functional tasks such as reaching overhead and yardwork with less difficulty.    Baseline testing deferred due to post-op precautions (09/13/2019); now able to complete AROM in limited range 2/5 (10/18/2019); able to tolerate moderate pressure (11/17/2019); nearly met - 4/5 in R shoulder abduction  (12/28/2019);    Time 12    Period Weeks    Status Partially Met      PT LONG TERM GOAL #5   Title Complete community, work and/or recreational activities without limitation due to current condition.    Baseline inability to lift or use R UE for activities such as reaching, lifting, carrying, stabilizing, pushing, pulling, grooming, ADLs, IADLs, yardwork, farmwork, community and social activity (09/13/2019); able to tie shows, improved gromming, ADLs, slight reaching and light carrying (10/18/2019); improving (11/17/2019); greatly improved - has not yet returned to painting overhead, shoveling, picking up heavy things, using his exercise machine, pulling himself up on a tractor (12/28/2019);    Time 12    Period Weeks    Status Partially Met                 Plan - 01/11/20 1318    Clinical Impression Statement Patient  has attended 30 physical therapy treatment sessions this episode of care and continues to make steady progress towards goals. Patient has been released by his surgeon for gradual and careful return to previous activities as tolerated. Patient has not yet returned to some of his more difficult usual activities but is progressing to light overhead strengthening. Patient would benefit from continued management of limiting condition by skilled physical therapist to address remaining impairments and functional limitations to work towards stated goals and return to PLOF or maximal functional independence    Personal Factors and Comorbidities Age;Behavior Pattern;Comorbidity 3+;Education;Social Background;Time since onset of injury/illness/exacerbation;Past/Current Experience;Fitness    Comorbidities hx of MI (2000) with stent placement x2 (2017, 2000), skin cancer, carcinoma, melanoma (removed 6 years ago), pre-diabetes, HTN, arthritis, kidney stones, cataract implants, hx of adenomatous colonic polyps. Denies lung problem, seizures, stroke    Examination-Activity Limitations Bed Mobility;Bathing;Reach Overhead;Self Feeding;Carry;Dressing;Hygiene/Grooming;Sleep;Lift;Toileting    Examination-Participation Restrictions Driving;Community Activity;Cleaning;Yard Work;Meal Prep;Other    Stability/Clinical Decision Making Stable/Uncomplicated    Rehab Potential Good    PT Frequency 2x / week    PT Duration 12 weeks    PT Treatment/Interventions ADLs/Self Care Home Management;Aquatic Therapy;Electrical Stimulation;Cryotherapy;Moist Heat;DME Instruction;Therapeutic activities;Therapeutic exercise;Balance training;Neuromuscular re-education;Patient/family education;Manual techniques;Compression bandaging;Scar mobilization;Dry needling;Passive range of motion;Joint Manipulations;Spinal Manipulations    PT Next Visit Plan Exercises/manual for improved ROM, progressive shoulder girdle strengthening, stability, and functional  activities as tolerated.    PT Home Exercise Plan Medbridge Access Code: [email or web address]    Consulted and Agree with Plan of Care Patient           Patient will benefit from skilled therapeutic intervention in order to improve the following deficits and impairments:  Decreased knowledge of use of  DME, Decreased skin integrity, Increased fascial restricitons, Pain, Increased muscle spasms, Decreased scar mobility, Decreased coordination, Decreased activity tolerance, Decreased endurance, Decreased range of motion, Decreased strength, Hypomobility, Impaired perceived functional ability, Impaired UE functional use, Impaired flexibility, Increased edema, Decreased safety awareness, Decreased knowledge of precautions  Visit Diagnosis: Chronic right shoulder pain  Stiffness of right shoulder, not elsewhere classified  Muscle weakness (generalized)     Problem List Patient Active Problem List   Diagnosis Date Noted  . Complete tear of right rotator cuff 09/07/2019  . Pain in right shoulder 07/27/2019  . Pre-diabetes 12/21/2015  . Abnormal stress test 12/20/2015  . Hypertension   . Coronary artery disease   . Hyperlipemia     Everlean Alstrom. Graylon Good, PT, DPT 01/11/20, 1:19 PM  [facility] [address], [place], [address] Phone: [phone]   Fax:  [phone]  Name: Kevin Stewart MRN: [id number] Date of Birth: 1941-04-18

## 2020-01-13 ENCOUNTER — Encounter: Payer: Medicare Other | Admitting: Physical Therapy

## 2020-01-18 ENCOUNTER — Encounter: Payer: Self-pay | Admitting: Physical Therapy

## 2020-01-18 ENCOUNTER — Ambulatory Visit: Payer: Medicare Other | Admitting: Physical Therapy

## 2020-01-18 ENCOUNTER — Other Ambulatory Visit: Payer: Self-pay

## 2020-01-18 DIAGNOSIS — M25511 Pain in right shoulder: Secondary | ICD-10-CM

## 2020-01-18 DIAGNOSIS — M6281 Muscle weakness (generalized): Secondary | ICD-10-CM | POA: Diagnosis not present

## 2020-01-18 DIAGNOSIS — M25611 Stiffness of right shoulder, not elsewhere classified: Secondary | ICD-10-CM | POA: Diagnosis not present

## 2020-01-18 DIAGNOSIS — G8929 Other chronic pain: Secondary | ICD-10-CM | POA: Diagnosis not present

## 2020-01-18 NOTE — Therapy (Addendum)
[facility] [address], [place], [address] Phone: [phone]   Fax:  [phone]  Physical Therapy Treatment / Discharge Summary Reporting Period: 09/13/2019 - 01/18/2020  Patient Details  Name: Kevin Stewart MRN: [id number] Date of Birth: 1941/02/21 Referring Provider (PT): Garald Balding, MD   Encounter Date: 01/18/2020   PT End of Session - 01/18/20 1305    Visit Number 31    Number of Visits 43    Date for PT Re-Evaluation 02/09/20    Authorization Type [employer] medicare reporting period from 01/18/2020    Authorization Time Period N/A    Authorization - Visit Number 1    Authorization - Number of Visits 10    Progress Note Due on Visit 40   26   PT Start Time 0950    PT Stop Time 1030    PT Time Calculation (min) 40 min    Activity Tolerance Patient tolerated treatment well;No increased pain    Behavior During Therapy WFL for tasks assessed/performed           Past Medical History:  Diagnosis Date  . Arthritis    "hands" (12/20/2015)  . Basal cell carcinoma, face   . Coronary artery disease    a. 2003 - light MI per patient; s/p Cypher DES to prox LAD in [facility].  Marland Kitchen Hx of adenomatous colonic polyps   . Hyperlipemia   . Hyperlipidemia   . Hypertension   . Kidney stones 1978   "passed them"  . Melanoma of right side of neck ([place])    removed posterior neck 12/312013  . Migraine    "stopped in 2000 when I had my heart attack"  . Myocardial infarction ([place]) 2000  . Pre-diabetes    checks cbg q month  . Rotator cuff tear, right   . Wears dentures    upper-lower partial  . Wears glasses     Past Surgical History:  Procedure Laterality Date  . CARDIAC CATHETERIZATION N/A 12/20/2015   Procedure: Left Heart Cath and Coronary Angiography;  Surgeon: Belva Crome, MD;  Location: [place] [facility];  Service: Cardiovascular;  Laterality: N/A;  . CARDIAC CATHETERIZATION N/A 12/20/2015    Procedure: Coronary Stent Intervention;  Surgeon: Belva Crome, MD;  Location: [place] [facility];  Service: Cardiovascular;  Laterality: N/A;  . CATARACT EXTRACTION W/ INTRAOCULAR LENS  IMPLANT, BILATERAL Bilateral 2011  . CIRCUMCISION  age 45  . COLONOSCOPY    . CORONARY ANGIOPLASTY WITH STENT PLACEMENT  2000   "1 stent"  . CORONARY ANGIOPLASTY WITH STENT PLACEMENT  12/20/2015   "1 stent"  . CYSTOSCOPY/URETEROSCOPY/HOLMIUM LASER/STENT PLACEMENT Right 08/03/2019   Procedure: CYSTOSCOPY/RETROGRADE/URETEROSCOPY//STENT PLACEMENT;  Surgeon: Ceasar Mons, MD;  Location: [facility];  Service: Urology;  Laterality: Right;  . HEMORRHOID SURGERY     "burned them off"  . MELANOMA EXCISION WITH SENTINEL LYMPH NODE BIOPSY  07/21/2012   Procedure: MELANOMA EXCISION WITH SENTINEL LYMPH NODE BIOPSY;  Surgeon: Rozetta Nunnery, MD;  Location: [facility];  Service: General;  Laterality: N/A;  melanoma excision with sentinel node biopsy   posterior right neck  . TONSILLECTOMY  1961    There were no vitals filed for this visit.   Subjective Assessment - 01/18/20 0950    Subjective Patient reports he is feeling well. He states he stretched his R shoulder into IR pretty hard yesterday and made it sore, but it  is better today. States no pain, just soreness. States lifting and exercises are going well at home. He did some work on his exercise machine and it went okay. Was able to use the weed-eater for 30 min then took a break and went back to it without difficulty. By end of session patient reports he thinks he feels ready for discharge and to continue his progress independently.    Pertinent History Patient is a 79 y.o. male who presents to outpatient physical therapy with a referral for medical diagnosis chonic R shoulder pain, nontraumatic complete tear of R rotator cuff. This patient's chief complaints consist of R shoulder pain, stiffness, weakness, surgical  precautions s/p R RTC repair 09/02/2019 leading to the following functional deficits: inability to lift or use R UE for activities such as reaching, lifting, carrying, stabilizing, pushing, pulling, grooming, ADLs, IADLs, yardwork, farmwork, community and social activity. Relevant past medical history and comorbidities include hx of MI (2000) with stent placement x2 (2017, 2000), skin cancer, carcinoma, melanoma (removed 6 years ago), pre-diabetes, HTN, arthritis, kidney stones, cataract implants, hx of adenomatous colonic polyps. Denies lung problem, seizures, stroke.    Diagnostic tests Pre-Op MRI report 08/17/2019: "IMPRESSION:1. Supraspinatus tendinosis with full-thickness non-retracted tearof the anterior tendon fibers in the region of the critical zone.2. Subscapularis tendinosis with partial-thickness tearing.3. Intra-articular biceps tendinosis with partial tearing.4. Glenohumeral and acromioclavicular osteoarthritis."    Patient Stated Goals get better    Currently in Pain? No/denies             OBJECTIVE  FOTO =73(01/18/2020)  RANGE OF MOTION  R shoulder PROM(last measured6/8/21) - flexion: 160* - abduction: 180* - ER: 90* - IR: 40* (isolated).   R shoulder AAROM(on wall) - flexion 153 - scaption152  R shoulder AROM: -Flexion: 150 - Abduction:146 (slight scaption) -FER: T5 - FIR: L1   STRENGTH (last measured 12/28/2019):  Shoulder  Flexion: R =4+/5, L =5/5.   Abduction: R =4/5, L =5/5.  External rotation: R =4+/5, L =4/5.  Internal rotation: R =4+/5, L =5/5. Elbow  Flexion: R =4+/5, L =5/5.  Extension: R =5/5, L =5/5. Grip strength (in pounds, average of three measures).   R: (54+60+59)/3 =58  L: (83+88+92)/ 3 =88  TREATMENT: R RTC repair 09/02/2019 19weeks, 4 days(01/17/2020)   Therapeutic exercise:to centralize symptoms and improve ROM, strength, muscular endurance, and activity tolerance required for  successful completion of functional activities. - standing AAROM R shoulder door stretch in flexion on wall on doorway, 2x 30-60 seconds.  - measurements to assess progress (see above).  - B scaption 3x10 at 3# DB - R bicep curl, 3x10 at 7# DB - banded R shoulder ER at 80 degrees abduction, yellow theraband, x10 plus additional reps for form.  - banded B shoulder row to ER at 90 degrees abduction, yellow theraband, x 10 plus additional reps to learn form.  - banded B shoulder row to ER at 90 degrees abduction to overhead press, yellow theraband, x 10 plus additional reps to learn form.  - standing B shoulder IR at 80 degrees abduction with red yellow theraband. Too easy resistance and difficulty with form. Moved to R side only with red then green theraband. Total of ~ 30 reps. - B shoulder flexion with isometric horizontal abduction against yellow theraband loop, x10 with constant pressure. 2x10 with small bounces to improve shoulder stability.   - Education on HEP including handout and updates to progress exercises. Provided additional bands.    Pt required multimodal  cuing for proper technique and to facilitate improved neuromuscular control, strength, range of motion, and functional ability resulting in improved performance and form.   HOME EXERCISE PROGRAM Access Code: [email or web address] URL: [email or web address] Date: 01/11/2020 Prepared by: Rosita Kea  Exercises Supine Shoulder Flexion Extension AAROM with Dowel - 1 x daily - 20 reps - 5 seconds hold Supine Shoulder Circles - 1-2 x daily - 2-3 sets - 20 reps Shoulder Flexion Wall Slide with Towel - 1 x daily - 1 sets - 20 reps - 5 seconds hold Row with band/cable - 1-2 x daily - 30 reps Shoulder Internal Rotation with Resistance - 1 x daily - 2 sets - 20 reps - 3 seconds hold Shoulder External Rotation with Anchored Resistance - 1 x daily - 2 sets - 20 reps - 3 seconds hold Standing Single Arm Bicep Curls with Rotation  and Dumbbell - 1 x daily - 2-3 sets - 10 reps Standing Shoulder Scaption - 1 x daily - 2-3 sets - 10 reps Seated Shoulder External Rotation in Abduction Supported with Dumbbell - 1 x daily - 2 sets - 20 reps Kettlebell Suitcase Carry - 1-2 x daily - 3 reps - 1 minute walk time Seated Shoulder External Rotation in Abduction Supported with Dumbbell - 1 x daily - 2 sets - 20 reps Suitcase and Waiter's Carry with Kettlebells - 1-2 x daily - 3 reps - 20 seconds hold - 20 seconds walk time       PT Education - 01/18/20 0953    Education Details Exercise purpose/form. Self management techniques. HEP. Discharge reccomendations.    Person(s) Educated Patient    Methods Explanation;Demonstration;Tactile cues;Verbal cues    Comprehension Verbalized understanding;Returned demonstration;Verbal cues required            PT Short Term Goals - 09/27/19 1951      PT SHORT TERM GOAL #1   Title Be independent with initial home exercise program for self-management of symptoms.    Baseline Initial HEP provided at IE (09/13/2019);    Time 2    Period Weeks    Status Achieved    Target Date 09/27/19             PT Long Term Goals - 01/18/20 1609      PT LONG TERM GOAL #1   Title Be independent with a long-term home exercise program for self-management of symptoms.    Baseline Initial HEP provided at IE (09/13/2019); currently participating in appropriate HEP (10/18/2019; 11/17/2019); currently participating in appropriat HEP, demonstrates/reports good self-management for gradually increasing load/activity (12/28/2019);    Time 12    Period Weeks    Status Achieved    Target Date --   TARGET DATE FOR ALL LONG TERM GOALS: 02/09/2020     PT LONG TERM GOAL #2   Title Demonstrate improved FOTO score to equal or greater than 65 to demonstrate improvement in overall condition and self-reported functional ability.    Baseline FOTO = 43 (09/13/2019); FOTO = 54 10/18/2019); 60 (11/17/2019); 73 (01/18/2020);     Time 12    Period Weeks    Status Achieved      PT LONG TERM GOAL #3   Title Have R shoulder AROM equal or greater than L shoulder AROM with no compensations or increase in pain in all planes except intermittent end range discomfort to allow patient to complete valued activities with less difficulty.    Baseline see objective exam, AROM deferred due to post-op precautions (09/13/2019);  able to participate in gentle AROM - see objective (10/18/2019); significant improvement (09/19/2019); very close - see objective exam - limitation in IR still (12/28/2019); continues to improve (01/11/2020); nearly met (01/18/2020);    Time 12    Period Weeks    Status Partially Met      PT LONG TERM GOAL #4   Title Improve R shoulder strength to 4+/5 for improved ability to allow patient to complete valued functional tasks such as reaching overhead and yardwork with less difficulty.    Baseline testing deferred due to post-op precautions (09/13/2019); now able to complete AROM in limited range 2/5 (10/18/2019); able to tolerate moderate pressure (11/17/2019); nearly met - 4/5 in R shoulder abduction  (12/28/2019);    Time 12    Period Weeks    Status Partially Met      PT LONG TERM GOAL #5   Title Complete community, work and/or recreational activities without limitation due to current condition.    Baseline inability to lift or use R UE for activities such as reaching, lifting, carrying, stabilizing, pushing, pulling, grooming, ADLs, IADLs, yardwork, farmwork, community and social activity (09/13/2019); able to tie shows, improved gromming, ADLs, slight reaching and light carrying (10/18/2019); improving (11/17/2019); greatly improved - has not yet returned to painting overhead, shoveling, picking up heavy things, using his exercise machine, pulling himself up on a tractor (12/28/2019); patient feels confident in his progress and ability to gradually progress to full activity (01/18/2020);    Time 12    Period Weeks    Status  Partially Met                 Plan - 01/18/20 1750    Clinical Impression Statement Patient attended 31 physical therapy sessions this episode of care and made steady progress towards his goals. He has met or nearly met all goals and appears ready for discharge to independent long term HEP. Patient's FOTO score has improved to 73, which exceeds predicted improvement in self-reported function. Patient has been advised to contact the office if he fails to continue improving or starts to regress. Patient is now discharged from PT due to improvement in condition.    Personal Factors and Comorbidities Age;Behavior Pattern;Comorbidity 3+;Education;Social Background;Time since onset of injury/illness/exacerbation;Past/Current Experience;Fitness    Comorbidities hx of MI (2000) with stent placement x2 (2017, 2000), skin cancer, carcinoma, melanoma (removed 6 years ago), pre-diabetes, HTN, arthritis, kidney stones, cataract implants, hx of adenomatous colonic polyps. Denies lung problem, seizures, stroke    Examination-Activity Limitations Bed Mobility;Bathing;Reach Overhead;Self Feeding;Carry;Dressing;Hygiene/Grooming;Sleep;Lift;Toileting    Examination-Participation Restrictions Driving;Community Activity;Cleaning;Yard Work;Meal Prep;Other    Stability/Clinical Decision Making Stable/Uncomplicated    Rehab Potential Good    PT Frequency 2x / week    PT Duration 12 weeks    PT Treatment/Interventions ADLs/Self Care Home Management;Aquatic Therapy;Electrical Stimulation;Cryotherapy;Moist Heat;DME Instruction;Therapeutic activities;Therapeutic exercise;Balance training;Neuromuscular re-education;Patient/family education;Manual techniques;Compression bandaging;Scar mobilization;Dry needling;Passive range of motion;Joint Manipulations;Spinal Manipulations    PT Next Visit Plan Patient is now discharged from PT to independent [place] Access Code: [email or web address]    Consulted and  Agree with Plan of Care Patient           Patient will benefit from skilled therapeutic intervention in order to improve the following deficits and impairments:  Decreased knowledge of use of DME, Decreased skin integrity, Increased fascial restricitons, Pain, Increased muscle spasms, Decreased scar mobility, Decreased coordination, Decreased activity tolerance, Decreased endurance, Decreased range of motion, Decreased strength, Hypomobility,  Impaired perceived functional ability, Impaired UE functional use, Impaired flexibility, Increased edema, Decreased safety awareness, Decreased knowledge of precautions  Visit Diagnosis: Chronic right shoulder pain  Stiffness of right shoulder, not elsewhere classified  Muscle weakness (generalized)     Problem List Patient Active Problem List   Diagnosis Date Noted  . Complete tear of right rotator cuff 09/07/2019  . Pain in right shoulder 07/27/2019  . Pre-diabetes 12/21/2015  . Abnormal stress test 12/20/2015  . Hypertension   . Coronary artery disease   . Hyperlipemia    Everlean Alstrom. Graylon Good, PT, DPT 01/18/20, 5:52 PM  [facility] [address], [place], [address] Phone: [phone]   Fax:  [phone]  Name: Kevin Stewart MRN: [id number] Date of Birth: 15-Jul-1941

## 2020-01-20 ENCOUNTER — Encounter: Payer: Medicare Other | Admitting: Physical Therapy

## 2020-01-25 ENCOUNTER — Encounter: Payer: Medicare Other | Admitting: Physical Therapy

## 2020-01-27 ENCOUNTER — Encounter: Payer: Medicare Other | Admitting: Physical Therapy

## 2020-01-27 DIAGNOSIS — I2510 Atherosclerotic heart disease of native coronary artery without angina pectoris: Secondary | ICD-10-CM | POA: Diagnosis not present

## 2020-01-27 DIAGNOSIS — I10 Essential (primary) hypertension: Secondary | ICD-10-CM | POA: Diagnosis not present

## 2020-01-27 DIAGNOSIS — I252 Old myocardial infarction: Secondary | ICD-10-CM | POA: Diagnosis not present

## 2020-01-27 DIAGNOSIS — E785 Hyperlipidemia, unspecified: Secondary | ICD-10-CM | POA: Diagnosis not present

## 2020-01-27 DIAGNOSIS — R3911 Hesitancy of micturition: Secondary | ICD-10-CM | POA: Diagnosis not present

## 2020-01-27 DIAGNOSIS — I25118 Atherosclerotic heart disease of native coronary artery with other forms of angina pectoris: Secondary | ICD-10-CM | POA: Diagnosis not present

## 2020-02-01 ENCOUNTER — Encounter: Payer: Medicare Other | Admitting: Physical Therapy

## 2020-02-03 ENCOUNTER — Encounter: Payer: Medicare Other | Admitting: Physical Therapy

## 2020-03-01 DIAGNOSIS — D1801 Hemangioma of skin and subcutaneous tissue: Secondary | ICD-10-CM | POA: Diagnosis not present

## 2020-03-01 DIAGNOSIS — L570 Actinic keratosis: Secondary | ICD-10-CM | POA: Diagnosis not present

## 2020-03-01 DIAGNOSIS — L905 Scar conditions and fibrosis of skin: Secondary | ICD-10-CM | POA: Diagnosis not present

## 2020-03-01 DIAGNOSIS — L819 Disorder of pigmentation, unspecified: Secondary | ICD-10-CM | POA: Diagnosis not present

## 2020-03-01 DIAGNOSIS — L814 Other melanin hyperpigmentation: Secondary | ICD-10-CM | POA: Diagnosis not present

## 2020-04-06 ENCOUNTER — Telehealth: Payer: Self-pay | Admitting: Cardiology

## 2020-04-06 NOTE — Telephone Encounter (Signed)
Patient states that he was exposed to covid on September 8th. He has an appt with Dr. [name] on Monday the 20th which will be 13 days. He states he has no symptoms and is fully vaccinated. Is he still able to come to his appt?

## 2020-04-06 NOTE — Telephone Encounter (Signed)
Spoke to patient advised protocol states after being exposed to covid you need to quarantine 14 days.Appointment wit Dr.Jordan rescheduled to 10/1 at 11:40 am.

## 2020-04-10 ENCOUNTER — Ambulatory Visit: Payer: Medicare Other | Admitting: Cardiology

## 2020-04-14 ENCOUNTER — Other Ambulatory Visit: Payer: Self-pay

## 2020-04-14 ENCOUNTER — Encounter: Payer: Self-pay | Admitting: Cardiology

## 2020-04-14 ENCOUNTER — Ambulatory Visit: Payer: Medicare Other | Admitting: Cardiology

## 2020-04-14 VITALS — BP 164/70 | HR 75 | Ht 71.0 in | Wt 177.0 lb

## 2020-04-14 DIAGNOSIS — I25118 Atherosclerotic heart disease of native coronary artery with other forms of angina pectoris: Secondary | ICD-10-CM

## 2020-04-14 DIAGNOSIS — I10 Essential (primary) hypertension: Secondary | ICD-10-CM

## 2020-04-14 DIAGNOSIS — E7800 Pure hypercholesterolemia, unspecified: Secondary | ICD-10-CM | POA: Diagnosis not present

## 2020-04-14 NOTE — Progress Notes (Signed)
Cardiology Office Note   Date:  04/14/2020   ID:  Kevin Stewart, DOB 1941-02-15, MRN [id number]  PCP:  [name], NP  Cardiologist:   Hawken Bielby [name], MD   Chief Complaint  Patient presents with  . Coronary Artery Disease      History of Present Illness: Kevin Stewart is a 79 y.o. male who is seen for follow up CAD.  He presented in 2003 with a "light" heart attack and had stenting of the proximal LAD with a 3.5 x 18 mm Cypher stent in [facility]. He has been on chronic ASA and Plavix.  He has risk factors of HTN, hypercholesterolemia, and family history of CAD. In the spring of 2017 he presented with exertional angina.  He had an ETT that was abnormal and underwent cardiac cath on 12/20/15 demonstrating a 99% proximal LAD stenosis proximal to the prior stent. This was treated with DES. He had a follow up  ETT in June 2018 which showed a marked improvement. Zetia was added for hypercholesterolemia. He did not tolerate this well and wanted to switch to Vytorin which he has tolerated in the past. When he took the 10/40 mg he continued to have symptoms and stopped it altogether. He was then on Crestor 5 mg daily.   On follow up today he is doing well. He denies any chest pain or dyspnea. He walks a lot on [place] property.   No palpitations. He is now retired. He did have a kidney stone and rotator cuff repair earlier this year.  Reports BP at home 361-443 systolic.    Past Medical History:  Diagnosis Date  . Arthritis    "hands" (12/20/2015)  . Basal cell carcinoma, face   . Coronary artery disease    a. 2003 - light MI per patient; s/p Cypher DES to prox LAD in [facility].  Marland Kitchen Hx of adenomatous colonic polyps   . Hyperlipemia   . Hyperlipidemia   . Hypertension   . Kidney stones 1978   "passed them"  . Melanoma of right side of neck ([place])    removed posterior neck 12/312013  . Migraine    "stopped in 2000 when I had my heart attack"  . Myocardial infarction ([place])  2000  . Pre-diabetes    checks cbg q month  . Rotator cuff tear, right   . Wears dentures    upper-lower partial  . Wears glasses     Past Surgical History:  Procedure Laterality Date  . CARDIAC CATHETERIZATION N/A 12/20/2015   Procedure: Left Heart Cath and Coronary Angiography;  Surgeon: Belva Crome, MD;  Location: [place] [facility];  Service: Cardiovascular;  Laterality: N/A;  . CARDIAC CATHETERIZATION N/A 12/20/2015   Procedure: Coronary Stent Intervention;  Surgeon: Belva Crome, MD;  Location: [place] [facility];  Service: Cardiovascular;  Laterality: N/A;  . CATARACT EXTRACTION W/ INTRAOCULAR LENS  IMPLANT, BILATERAL Bilateral 2011  . CIRCUMCISION  age 80  . COLONOSCOPY    . CORONARY ANGIOPLASTY WITH STENT PLACEMENT  2000   "1 stent"  . CORONARY ANGIOPLASTY WITH STENT PLACEMENT  12/20/2015   "1 stent"  . CYSTOSCOPY/URETEROSCOPY/HOLMIUM LASER/STENT PLACEMENT Right 08/03/2019   Procedure: CYSTOSCOPY/RETROGRADE/URETEROSCOPY//STENT PLACEMENT;  Surgeon: Ceasar Mons, MD;  [facility];  Service: Urology;  Laterality: Right;  . HEMORRHOID SURGERY     "burned them off"  . MELANOMA EXCISION WITH SENTINEL LYMPH NODE BIOPSY  07/21/2012  Procedure: MELANOMA EXCISION WITH SENTINEL LYMPH NODE BIOPSY;  Surgeon: Rozetta Nunnery, MD;  Location: [place];  Service: General;  Laterality: N/A;  melanoma excision with sentinel node biopsy   posterior right neck  . TONSILLECTOMY  1961     Current Outpatient Medications  Medication Sig Dispense Refill  . aspirin 81 MG tablet Take 81 mg by mouth daily.    . carvedilol (COREG) 12.5 MG tablet Take 12.5 mg by mouth at bedtime.  180 tablet 3  . clopidogrel (PLAVIX) 75 MG tablet Take 75 mg by mouth at bedtime.     Marland Kitchen co-enzyme Q-10 50 MG capsule Take 50 mg by mouth daily.    . Enalapril-Hydrochlorothiazide 5-12.5 MG tablet Take 1 tablet by mouth daily.    . nitroGLYCERIN (NITROSTAT) 0.4 MG  SL tablet Place 1 tablet (0.4 mg total) under the tongue every 5 (five) minutes as needed for chest pain (up to 3 doses). 25 tablet 3  . rosuvastatin (CRESTOR) 5 MG tablet Take 1 tablet (5 mg total) by mouth daily. (Patient taking differently: Take 5 mg by mouth at bedtime. ) 90 tablet 3   No current facility-administered medications for this visit.    Allergies:   Shrimp [shellfish allergy]    Social History:  The patient  reports that he quit smoking about 46 years ago. His smoking use included cigarettes. He has a 20.00 pack-year smoking history. He has quit using smokeless tobacco.  His smokeless tobacco use included chew. He reports current alcohol use. He reports that he does not use drugs.   Family History:  The patient's family history includes Cancer - Colon (age of onset: 67) in his brother; Cancer - Other (age of onset: 63) in his father; Cancer - Other (age of onset: 92) in his sister; Heart attack (age of onset: 7) in his mother; Heart attack (age of onset: 39) in his brother.    ROS:  Please see the history of present illness.     All other systems are reviewed and negative.    PHYSICAL EXAM: VS:  BP (!) 164/70   Pulse 75   Ht '5\' 11"'  (1.803 m)   Wt 177 lb (80.3 kg)   SpO2 100%   BMI 24.69 kg/m  , BMI Body mass index is 24.69 kg/m.   EKG:  EKG is not ordered today. Ecg 08/03/19 showed NSR with old anterior infarct. I have personally reviewed and interpreted this study.     Lab Results  Component Value Date   WBC 12.0 (H) 07/15/2019   HGB 14.6 08/03/2019   HCT 43.0 08/03/2019   PLT 206 07/15/2019   GLUCOSE 118 (H) 08/03/2019   CHOL 196 04/15/2019   TRIG 61 04/15/2019   HDL 69 04/15/2019   LDLCALC 116 (H) 04/15/2019   ALT 23 07/15/2019   AST 23 07/15/2019   NA 138 08/03/2019   K 3.9 08/03/2019   CL 100 08/03/2019   CREATININE 0.90 08/03/2019   BUN 17 08/03/2019   CO2 26 07/15/2019   INR 1.00 12/19/2015    Labs dated 06/06/16: cholesterol 181,  triglycerides 48, HDL 62, LDL 109. A1c 5.5%. CMET normal. Dated 06/26/17: cholesterol 250, triglycerides 60, HDL 59, LDL 179. CMET normal.  Dated 12/24/17: cholesterol 172, triglycerides 55, HDL 60, LDL 101. Glucose 111. Otherwise CMET normal.  Dated 07/29/19: cholesterol 196, triglycerides 88, HDL 60, LDL 120. Glucose 124 otherwise CBC and CMET normal.   Wt Readings from Last 3 Encounters:  04/14/20 177 lb (80.3 kg)  01/06/20 165 lb (74.8 kg)  11/23/19 165 lb (74.8 kg)      Other studies Reviewed: Additional studies/ records that were reviewed today include: Cardiac cath 12/20/15: Review of the above records demonstrates:  Conclusion   1. Prox RCA to Dist RCA lesion, 30% stenosed. 2. Mid LAD lesion, 20% stenosed. The lesion was previously treated with a stent (unknown type). 3. Prox LAD to Mid LAD lesion, 90% stenosed. Post intervention, there is a 10% residual stenosis.    90% stenosis proximal to the previously placed LAD stent within the heavily calcified segment.  Dominant right coronary which wraps around the left ventricular apex. Minimal luminal irregularities are noted.  Patent circumflex and left main.  Successful percutaneous intervention with [place] Ball[place] angioplasty of the proximal stenosis followed by drug-eluting stent implantation reducing the 90% stenosis to 10% with TIMI grade 3 flow. Postdilatation was to 3.75 mm at 16 atm.  Normal left ventricular systolic function with normal end-diastolic pressure.  Recommendations:   Continue Plavix and aspirin  Discharge tomorrow if no complications overnight.    ETT 12/25/16: Study Highlights     Blood pressure demonstrated a hypertensive response to exercise.  Horizontal ST segment depression ST segment depression of 1 mm was noted during stress in the II, III, aVF, V5 and V6 leads, and returning to baseline after less than 1 minute of recovery.   1. Excellent exercise tolerance.  2. Hypertensive BP response.    3. Equivocal changes on exercise ECG: about 1 mm horizontal ST depression in inferior leads and V5,V6 though difficult to tell for sure due to baseline artifact.  ST segments returned to baseline quickly in recovery.   Overall low risk study with excellent exercise tolerance but not normal.      ASSESSMENT AND PLAN:  1.  Coronary artery disease- native vessel with stable angina class 1 symptoms. Remote stenting of the LAD in 2003 with a Cypher stent. S/p stenting of the proximal LAD in May 2017 with DES.  Now on DAPT with ASA and Plavix. On beta blocker and ACEi. Plan to continue Plavix indefinitely with first generation DES. Follow up ETT in June 2018 was low risk and much improved from prior.   2. Hypercholesterolemia. Intolerant of Vytorin and lipitor. Able to tolerate Crestor at 5 mg daily but does have some aches. Unable to afford Repatha.     3. HTN controlled based on home readings.    Current medicines are reviewed at length with the patient today.  The patient does not have concerns regarding medicines.  The following changes have been made:  no change  Labs/ tests ordered today include:   No orders of the defined types were placed in this encounter.    Disposition:   Follow up in one year  Signed, Alexes Lamarque [name], MD  04/14/2020 10:04 AM    [facility] [address], [place], [place], [address] Phone [phone], Fax [phone]

## 2020-04-19 ENCOUNTER — Telehealth: Payer: Self-pay | Admitting: Cardiology

## 2020-04-19 NOTE — Telephone Encounter (Signed)
Spoke with patient about scheduling follow up visit with [name] from recall list, patient stated he has come in contact with some who was positive and was told to call back some time next year to get appointment scheduled

## 2020-04-21 ENCOUNTER — Ambulatory Visit: Payer: Medicare Other | Admitting: Cardiology

## 2020-04-28 ENCOUNTER — Telehealth: Payer: Self-pay | Admitting: *Deleted

## 2020-04-28 NOTE — Telephone Encounter (Signed)
   Primary Cardiologist: Peter [name], MD  Chart reviewed as part of pre-operative protocol coverage.   The patient was doing well on cardiac stand point when last seen by Dr. [name] 04/14/20. Per office note "1.  Coronary artery disease- native vessel with stable angina class 1 symptoms. Remote stenting of the LAD in 2003 with a Cypher stent. S/p stenting of the proximal LAD in May 2017 with DES.  Now on DAPT with ASA and Plavix. On beta blocker and ACEi. Plan to continue Plavix indefinitely with first generation DES. Follow up ETT in June 2018 was low risk and much improved from prior"  Dr. [name], please give your recommendations regarding DAPT. Please forward your response to [facility] PREOP.   Thank you     Leanor Kail, PA 04/28/2020, 3:11 PM

## 2020-04-28 NOTE — Telephone Encounter (Signed)
° °  [facility] Pre-operative Risk Assessment    Request for surgical clearance:  1. What type of surgery is being performed?  PROSTATE BIOPSY   2. When is this surgery scheduled?  05/30/20   3. What type of clearance is required (medical clearance vs. Pharmacy clearance to hold med vs. Both)?  BOTH  4. Are there any medications that need to be held prior to surgery and how long? 5 DAYS  ON BOTH ASA AND PLAVIX   5. Practice name and name of physician performing surgery?  [facility] DR CHRISTOPHER WINTER   6. What is the office phone number?  [phone]   7.   What is the office fax number?  [phone]  8.   Anesthesia type (None, local, MAC, general) ?  CHOICE   Devra Dopp 04/28/2020, 11:38 AM  _________________________________________________________________   (provider comments below)

## 2020-04-29 NOTE — Telephone Encounter (Signed)
May hold ASA and Plavix for 5 days for prostate bx  Alexandar Weisenberger [name] MD, [facility]

## 2020-05-01 NOTE — Telephone Encounter (Signed)
   Primary Cardiologist: Peter [name], MD  Chart reviewed as part of pre-operative protocol coverage. Given past medical history and time since last visit, based on ACC/AHA guidelines, Kevin Stewart would be at acceptable risk for the planned procedure without further cardiovascular testing.   His Plavix and aspirin may be held for 5 days prior to his procedure.  Please resume as soon as hemostasis is achieved.  I will route this recommendation to the requesting party via Epic fax function and remove from pre-op pool.  Please call with questions.   [name]. Zoey Gilkeson NP-C    05/01/2020, 8:57 AM [place] [address] Office [phone] Fax [phone]

## 2020-05-02 NOTE — Telephone Encounter (Signed)
Faxed to requesting providers office via [place] fax function-2nd fax

## 2020-05-03 DIAGNOSIS — L570 Actinic keratosis: Secondary | ICD-10-CM | POA: Diagnosis not present

## 2020-06-21 HISTORY — PX: PROSTATE BIOPSY: SHX241

## 2020-06-25 DIAGNOSIS — Z20822 Contact with and (suspected) exposure to covid-19: Secondary | ICD-10-CM | POA: Diagnosis not present

## 2020-06-26 ENCOUNTER — Telehealth: Payer: Self-pay | Admitting: Radiation Oncology

## 2020-06-26 ENCOUNTER — Encounter: Payer: Self-pay | Admitting: Radiation Oncology

## 2020-06-26 NOTE — Progress Notes (Signed)
GU Location of Tumor / Histology: prostatic adenocarcinoma  If Prostate Cancer, Gleason Score is (3 + 4) and PSA is (4.65). Prostate volume: 62.69 grams.   Biopsies of prostate (if applicable) revealed:   Past/Anticipated interventions by urology, if any: prostate biopsy, referral to Dr. Tammi Klippel for consideration of radiotherapy  Past/Anticipated interventions by medical oncology, if any: no  Weight changes, if any: denies  Bowel/Bladder complaints, if any: Endorses taking tamsulosin. IPSS 6. SHIM 1. Denies dysuria, hematuria, urinary leakage or incontinence. Denies any bowel complaints.   Nausea/Vomiting, if any: denies  Pain issues, if any:  Reports generalized joint pain related to effects of COVID 19  SAFETY ISSUES:  Prior radiation? denies  Pacemaker/ICD? denies  Possible current pregnancy? no, male patient  Is the patient on methotrexate? denies  Current Complaints / other details:  79 year old male. Married with 2 children. Res[facility] in [facility].

## 2020-06-26 NOTE — Telephone Encounter (Signed)
Received voicemail message from patient requesting return call. Phoned patient back to inquire.   Noted patient has a telephone consultation tomorrow starting at 1430.   Patient verbalizes his concern that he or his wife may receive a call about "that infusion to treat their covid during his appointment or his cough may interfere with his appointment."   Explained to the patient these are things we can work around and its best to keep the appointment if possible. Patient verbalized understanding.

## 2020-06-27 ENCOUNTER — Other Ambulatory Visit: Payer: Self-pay

## 2020-06-27 ENCOUNTER — Other Ambulatory Visit: Payer: Self-pay | Admitting: Urology

## 2020-06-27 ENCOUNTER — Ambulatory Visit
Admission: RE | Admit: 2020-06-27 | Discharge: 2020-06-27 | Disposition: A | Discharge status: Home / Self Care | Payer: Medicare Other | Source: Ambulatory Visit | Attending: Radiation Oncology | Admitting: Radiation Oncology

## 2020-06-27 ENCOUNTER — Encounter: Payer: Self-pay | Admitting: Radiation Oncology

## 2020-06-27 VITALS — Ht 71.0 in | Wt 171.0 lb

## 2020-06-27 DIAGNOSIS — C61 Malignant neoplasm of prostate: Secondary | ICD-10-CM

## 2020-06-27 HISTORY — DX: Malignant neoplasm of prostate: C61

## 2020-06-27 MED ORDER — FINASTERIDE 5 MG PO TABS: 5.0000 mg | ORAL_TABLET | Freq: Every day | ORAL | 2 refills | Status: DC

## 2020-06-27 NOTE — Progress Notes (Signed)
Radiation Oncology         [phone] ________________________________  Initial Outpatient Consultation - Conducted via Telephone due to current COVID-19 concerns for limiting patient exposure  Name: Kevin Stewart MRN: [id number]  Date: 06/27/2020  DOB: 01-24-1941  CC:Lam, Rudi Rummage, NP  Davis Gourd*   REFERRING PHYSICIAN: Davis Gourd*  DIAGNOSIS: 79 y.o. gentleman with Stage T1c adenocarcinoma of the prostate with Gleason score of 3+4, and PSA of 4.65.    ICD-10-CM   1. Malignant neoplasm of prostate ([place])  C61     HISTORY OF PRESENT ILLNESS: Kevin Stewart is a 79 y.o. male with a diagnosis of prostate cancer. He is an established urology patient previously followed by Dr. Lovena Neighbours for a history of kidney stones. He was initially noted to have an elevated PSA of 4.4 in 01/2020 and remained elevated at 5 when repeated in 02/2020.  Therefore, he was referred back to Dr. Lovena Neighbours on 04/27/2020,  digital rectal examination was performed at that time revealing no nodules. Repeat PSA from that day remained elevated at 4.65. The patient proceeded to transrectal ultrasound with 12 biopsies of the prostate on 05/30/2020.  The prostate volume measured 62.69 cc.  Out of 12 core biopsies, 6 were positive, all on the left.  The maximum Gleason score was 3+4, and this was seen in the left mid. Additionally, Gleason 3+3 was seen in the remaining five left sided cores.  The patient reviewed the biopsy results with his urologist and he has kindly been referred today for discussion of potential radiation treatment options.   PREVIOUS RADIATION THERAPY: No  PAST MEDICAL HISTORY:  Past Medical History:  Diagnosis Date  . Arthritis    "hands" (12/20/2015)  . Basal cell carcinoma, face   . Coronary artery disease    a. 2003 - light MI per patient; s/p Cypher DES to prox LAD in [facility].  Marland Kitchen Hx of adenomatous colonic polyps   . Hyperlipemia   . Hyperlipidemia   . Hypertension    . Kidney stones 1978   "passed them"  . Melanoma of right side of neck ([place])    removed posterior neck 12/312013  . Migraine    "stopped in 2000 when I had my heart attack"  . Myocardial infarction ([place]) 2000  . Pre-diabetes    checks cbg q month  . Prostate cancer ([place])   . Rotator cuff tear, right   . Wears dentures    upper-lower partial  . Wears glasses       PAST SURGICAL HISTORY: Past Surgical History:  Procedure Laterality Date  . CARDIAC CATHETERIZATION N/A 12/20/2015   Procedure: Left Heart Cath and Coronary Angiography;  Surgeon: Belva Crome, MD;  Location: [place] [facility];  Service: Cardiovascular;  Laterality: N/A;  . CARDIAC CATHETERIZATION N/A 12/20/2015   Procedure: Coronary Stent Intervention;  Surgeon: Belva Crome, MD;  Location: [place] [facility];  Service: Cardiovascular;  Laterality: N/A;  . CATARACT EXTRACTION W/ INTRAOCULAR LENS  IMPLANT, BILATERAL Bilateral 2011  . CIRCUMCISION  age 31  . COLONOSCOPY    . CORONARY ANGIOPLASTY WITH STENT PLACEMENT  2000   "1 stent"  . CORONARY ANGIOPLASTY WITH STENT PLACEMENT  12/20/2015   "1 stent"  . CYSTOSCOPY/URETEROSCOPY/HOLMIUM LASER/STENT PLACEMENT Right 08/03/2019   Procedure: CYSTOSCOPY/RETROGRADE/URETEROSCOPY//STENT PLACEMENT;  Surgeon: Ceasar Mons, MD;  Location: [facility];  Service: Urology;  Laterality: Right;  . HEMORRHOID SURGERY     "burned them off"  .  MELANOMA EXCISION WITH SENTINEL LYMPH NODE BIOPSY  07/21/2012   Procedure: MELANOMA EXCISION WITH SENTINEL LYMPH NODE BIOPSY;  Surgeon: Rozetta Nunnery, MD;  Location: [place];  Service: General;  Laterality: N/A;  melanoma excision with sentinel node biopsy   posterior right neck  . TONSILLECTOMY  1961    FAMILY HISTORY:  Family History  Problem Relation Age of Onset  . Heart attack Mother 60  . Cancer - Other Father 56       pancreatic  . Pancreatic cancer Father   . Heart attack Brother 58   . Cancer - Colon Brother 39  . Cancer - Other Sister 67  . Pancreatic cancer Sister   . Prostate cancer Neg Hx   . Breast cancer Neg Hx     SOCIAL HISTORY:  Social History   Socioeconomic History  . Marital status: Married    Spouse name: Not on file  . Number of children: 2  . Years of education: Not on file  . Highest education level: Not on file  Occupational History  . Not on file  Tobacco Use  . Smoking status: Former Smoker    Packs/day: 2.00    Years: 10.00    Pack years: 20.00    Types: Cigarettes    Quit date: 07/10/1973    Years since quitting: 47.0  . Smokeless tobacco: Former Systems developer    Types: Chew  . Tobacco comment: "chewed a little; stopped in 06/1973"  Vaping Use  . Vaping Use: Never used  Substance and Sexual Activity  . Alcohol use: Yes    Comment: 12/20/2015 'quit in ~ 2012-2013"  . Drug use: No  . Sexual activity: Not Currently  Other Topics Concern  . Not on file  Social History Narrative  . Not on file   Social Determinants of Health   Financial Resource Strain: Not on file  Food Insecurity: Not on file  Transportation Needs: Not on file  Physical Activity: Not on file  Stress: Not on file  Social Connections: Not on file  Intimate Partner Violence: Not on file    ALLERGIES: Shrimp [shellfish allergy]  MEDICATIONS:  Current Outpatient Medications  Medication Sig Dispense Refill  . aspirin 81 MG tablet Take 81 mg by mouth daily.    . clopidogrel (PLAVIX) 75 MG tablet Take 75 mg by mouth at bedtime.     Marland Kitchen co-enzyme Q-10 50 MG capsule Take 50 mg by mouth daily.    . Enalapril-Hydrochlorothiazide 5-12.5 MG tablet Take 1 tablet by mouth daily.    . tamsulosin (FLOMAX) 0.4 MG CAPS capsule Take 0.4 mg by mouth daily.    . finasteride (PROSCAR) 5 MG tablet Take 1 tablet (5 mg total) by mouth daily. 30 tablet 2  . nitroGLYCERIN (NITROSTAT) 0.4 MG SL tablet Place 1 tablet (0.4 mg total) under the tongue every 5 (five) minutes as needed for  chest pain (up to 3 doses). (Patient not taking: Reported on 06/27/2020) 25 tablet 3   No current facility-administered medications for this encounter.    REVIEW OF SYSTEMS:  On review of systems, the patient reports that he is doing well overall. He denies any chest pain, shortness of breath, cough, fevers, chills, night sweats, unintended weight changes. He denies any bowel disturbances, and denies abdominal pain, nausea or vomiting. He denies any new musculoskeletal or joint aches or pains. His IPSS was 6, indicating mild urinary symptoms. He endorses taking tamsulosin. His SHIM was 1, indicating he has  severe erectile dysfunction. A complete review of systems is obtained and is otherwise negative.    PHYSICAL EXAM:  Wt Readings from Last 3 Encounters:  06/27/20 171 lb (77.6 kg)  04/14/20 177 lb (80.3 kg)  01/06/20 165 lb (74.8 kg)   Temp Readings from Last 3 Encounters:  08/03/19 97.8 F (36.6 C)  07/15/19 97.6 F (36.4 C) (Oral)  04/08/19 (!) 97.3 F (36.3 C)   BP Readings from Last 3 Encounters:  04/14/20 (!) 164/70  08/03/19 (!) 159/83  07/15/19 (!) 147/74   Pulse Readings from Last 3 Encounters:  04/14/20 75  08/03/19 65  07/15/19 (!) 112   Pain Assessment Pain Score: 2  Pain Frequency: Constant Pain Loc: Generalized (generalized joint pain related to effects of covid 19)/10  Physical exam not performed in light of telephone encounter.   KPS = 100  100 - Normal; no complaints; no evidence of disease. 90   - Able to carry on normal activity; minor signs or symptoms of disease. 80   - Normal activity with effort; some signs or symptoms of disease. 65   - Cares for self; unable to carry on normal activity or to do active work. 60   - Requires occasional assistance, but is able to care for most of his personal needs. 50   - Requires considerable assistance and frequent medical care. 70   - Disabled; requires special care and assistance. 19   - Severely disabled;  hospital admission is indicated although death not imminent. 43   - Very sick; hospital admission necessary; active supportive treatment necessary. 10   - Moribund; fatal processes progressing rapidly. 0     - Dead  Karnofsky DA, Abelmann Dodge, Craver LS and Burchenal [facility] [phone]) The use of the nitrogen mustards in the palliative treatment of carcinoma: with particular reference to bronchogenic carcinoma Cancer 1 634-56  LABORATORY DATA:  Lab Results  Component Value Date   WBC 12.0 (H) 07/15/2019   HGB 14.6 08/03/2019   HCT 43.0 08/03/2019   MCV 88.8 07/15/2019   PLT 206 07/15/2019   Lab Results  Component Value Date   NA 138 08/03/2019   K 3.9 08/03/2019   CL 100 08/03/2019   CO2 26 07/15/2019   Lab Results  Component Value Date   ALT 23 07/15/2019   AST 23 07/15/2019   ALKPHOS 37 (L) 07/15/2019   BILITOT 0.7 07/15/2019     RADIOGRAPHY: No results found.    IMPRESSION/PLAN: This visit was conducted via Telephone to spare the patient unnecessary potential exposure in the healthcare setting during the current COVID-19 pandemic. 1. 79 y.o. gentleman with Stage T1c adenocarcinoma of the prostate with Gleason Score of 3+4, and PSA of 4.65. We discussed the patient's workup and outlined the nature of prostate cancer in this setting. The patient's T stage, Gleason's score, and PSA put him into the favorable intermediate risk group. Accordingly, he is eligible for a variety of potential treatment options including brachytherapy, 5.5 weeks of external radiation, or prostatectomy. We discussed the available radiation techniques, and focused on the details and logistics of delivery. We discussed and outlined the risks, benefits, short and long-term effects associated with radiotherapy and compared and contrasted these with prostatectomy. We discussed the role of SpaceOAR in reducing the rectal toxicity associated with radiotherapy. He appears to have a good understanding of his disease and our  treatment recommendations which are of curative intent.  He was encouraged to ask questions that were answered to  his stated satisfaction.  At the end of the conversation, the patient is interested in moving forward with brachytherapy and use of SpaceOAR gel to reduce rectal toxicity from radiotherapy.  We will share our discussion with Dr. Lovena Neighbours and move forward with scheduling his CT [facility] planning appointment in the near future.  The patient will be contacted by Romie Jumper in our office who will be working closely with him to coordinate OR scheduling and pre and post procedure appointments.  We will contact the pharmaceutical rep to ensure that [place] is available at the time of procedure.  We enjoyed meeting him today and look forward to continuing to participate in his care.  Given current concerns for patient exposure during the COVID-19 pandemic, this encounter was conducted via telephone. The patient was notified in advance and was offered a MyChart meeting to allow for face to face communication but unfortunately reported that he did not have the appropriate resources/technology to support such a visit and instead preferred to proceed with telephone consult. The patient has given verbal consent for this type of encounter. The time spent during this encounter was 60 minutes. The attendants for this meeting include Tyler Pita MD, Ashlyn Bruning PA-C, [place], and patient, Kevin Stewart. During the encounter, Tyler Pita MD, Ashlyn Bruning PA-C, and scribe, Wilburn Mylar were located at [facility].  Patient, Kevin Stewart was located at home.    Nicholos Johns, PA-C    Tyler Pita, MD  [facility] Direct Dial: [phone]  Fax: [phone] [email or web address]  Skype  LinkedIn  This document serves as a record of services personally performed by Tyler Pita, MD and Freeman Caldron, PA-C. It was created on their behalf by Wilburn Mylar, a trained medical scribe. The creation of this record is based on the scribe's personal observations and the provider's statements to them. This document has been checked and approved by the attending provider.

## 2020-06-28 DIAGNOSIS — U071 COVID-19: Secondary | ICD-10-CM

## 2020-06-28 HISTORY — DX: COVID-19: U07.1

## 2020-06-30 ENCOUNTER — Telehealth: Payer: Self-pay | Admitting: *Deleted

## 2020-06-30 NOTE — Telephone Encounter (Signed)
CALLED PATIENT TO ASK QUESTIONS, SPOKE WITH PATIENT 

## 2020-07-11 ENCOUNTER — Telehealth: Payer: Self-pay | Admitting: Radiation Oncology

## 2020-07-11 NOTE — Telephone Encounter (Signed)
Received voicemail message from patient requesting a return call. Phoned patient back to inquire. No answer. Left voicemail message explaining that I am returning his call and if his need persist to callback to [phone] (my direct number). Also, reminded patient of pre seed simulation and follow up appointment with Ashlyn Bruning, PA-C on 08/10/20 starting at 0900.

## 2020-07-19 ENCOUNTER — Other Ambulatory Visit: Payer: Self-pay | Admitting: Urology

## 2020-07-19 ENCOUNTER — Telehealth: Payer: Self-pay | Admitting: Cardiology

## 2020-07-19 DIAGNOSIS — C61 Malignant neoplasm of prostate: Secondary | ICD-10-CM

## 2020-07-19 NOTE — Telephone Encounter (Signed)
   [facility] Pre-operative Risk Assessment    HEARTCARE STAFF: - Please ensure there is not already an duplicate clearance open for this procedure. - Under Visit Info/Reason for Call, type in Other and utilize the format Clearance MM/DD/YY or Clearance TBD. Do not use dashes or single digits. - If request is for dental extraction, please clarify the # of teeth to be extracted.  Request for surgical clearance:  1. What type of surgery is being performed? Radioactive Seed Implant into the prostate with Space OAR Instillation   2. When is this surgery scheduled? 09/19/20  3. What type of clearance is required (medical clearance vs. Pharmacy clearance to hold med vs. Both)? Both  4. Are there any medications that need to be held prior to surgery and how long? Aspirin and Plavix 5 days prior   5. Practice name and name of physician performing surgery? Dr. Ellison Hughs, [facility]  6. What is the office phone number? [phone]   7.   What is the office fax number? [phone]  8.   Anesthesia type (None, local, MAC, general) ? General    Johnna Acosta 07/19/2020, 4:18 PM  _________________________________________________________________   (provider comments below)

## 2020-07-19 NOTE — Telephone Encounter (Signed)
   Primary Cardiologist: Peter Swaziland, MD  Chart reviewed as part of pre-operative protocol coverage. Patient was contacted 07/19/2020 in reference to pre-operative risk assessment for pending surgery as outlined below.  Kevin Stewart was last seen on 04/14/20 by Dr. Swaziland.  Since that day, Kevin Stewart has done well.  Therefore, based on ACC/AHA guidelines, the patient would be at acceptable risk for the planned procedure without further cardiovascular testing.   Per previous recommendation by Dr. Swaziland, he may hold Aspirin and Plavix 5 days prior to procedure. Resume as soon as felt safe from surgeon's perspective.   Kevin Stewart does share with me that both he and his wife had COVID though feels he is back to his baseline with only an occasional cough. Denies chest pain, pressure, tightness, dyspnea on exertion. They had two doses of MRNA vaccine but had not yet had their booster at time of infection.   The patient was advised that if he develops new symptoms prior to surgery to contact our office to arrange for a follow-up visit, and he verbalized understanding.  I will route this recommendation to the requesting party via Epic fax function and remove from pre-op pool. Please call with questions.  Kevin Sorrow, NP 07/19/2020, 4:34 PM

## 2020-07-25 ENCOUNTER — Telehealth: Payer: Self-pay | Admitting: *Deleted

## 2020-07-25 NOTE — Telephone Encounter (Signed)
CALLED PATIENT TO INFORM OF PRE-SEED APPTS. AND IMPLANT, SPOKE WITH PATIENT AND HE IS AWARE OF THESE APPTS. 

## 2020-07-27 DIAGNOSIS — I10 Essential (primary) hypertension: Secondary | ICD-10-CM | POA: Diagnosis not present

## 2020-07-27 DIAGNOSIS — I252 Old myocardial infarction: Secondary | ICD-10-CM | POA: Diagnosis not present

## 2020-07-27 DIAGNOSIS — R3911 Hesitancy of micturition: Secondary | ICD-10-CM | POA: Diagnosis not present

## 2020-07-27 DIAGNOSIS — E785 Hyperlipidemia, unspecified: Secondary | ICD-10-CM | POA: Diagnosis not present

## 2020-07-27 DIAGNOSIS — I2510 Atherosclerotic heart disease of native coronary artery without angina pectoris: Secondary | ICD-10-CM | POA: Diagnosis not present

## 2020-07-27 DIAGNOSIS — J309 Allergic rhinitis, unspecified: Secondary | ICD-10-CM | POA: Diagnosis not present

## 2020-07-27 DIAGNOSIS — I25118 Atherosclerotic heart disease of native coronary artery with other forms of angina pectoris: Secondary | ICD-10-CM | POA: Diagnosis not present

## 2020-07-27 DIAGNOSIS — Z6823 Body mass index (BMI) 23.0-23.9, adult: Secondary | ICD-10-CM | POA: Diagnosis not present

## 2020-07-27 DIAGNOSIS — Z8616 Personal history of COVID-19: Secondary | ICD-10-CM | POA: Diagnosis not present

## 2020-08-07 ENCOUNTER — Telehealth: Payer: Self-pay | Admitting: *Deleted

## 2020-08-07 NOTE — Telephone Encounter (Signed)
Returned patient's phone call, spoke with patient 

## 2020-08-09 ENCOUNTER — Telehealth: Payer: Self-pay | Admitting: *Deleted

## 2020-08-09 NOTE — Telephone Encounter (Signed)
Called patient to remind of pre-seed appts. for 08-10-20, spoke with patient and he is aware of these appts. 

## 2020-08-10 ENCOUNTER — Other Ambulatory Visit: Payer: Self-pay

## 2020-08-10 ENCOUNTER — Ambulatory Visit
Admission: RE | Admit: 2020-08-10 | Discharge: 2020-08-10 | Disposition: A | Discharge status: Home / Self Care | Payer: Medicare Other | Source: Ambulatory Visit | Attending: Radiation Oncology | Admitting: Radiation Oncology

## 2020-08-10 ENCOUNTER — Encounter (HOSPITAL_COMMUNITY)
Admission: RE | Admit: 2020-08-10 | Discharge: 2020-08-10 | Disposition: A | Discharge status: Home / Self Care | Payer: Medicare Other | Source: Ambulatory Visit | Attending: Urology | Admitting: Urology

## 2020-08-10 ENCOUNTER — Ambulatory Visit (HOSPITAL_COMMUNITY)
Admission: RE | Admit: 2020-08-10 | Discharge: 2020-08-10 | Disposition: A | Discharge status: Home / Self Care | Payer: Medicare Other | Source: Ambulatory Visit | Attending: Urology | Admitting: Urology

## 2020-08-10 ENCOUNTER — Ambulatory Visit
Admission: RE | Admit: 2020-08-10 | Discharge: 2020-08-10 | Disposition: A | Discharge status: Home / Self Care | Payer: Medicare Other | Source: Ambulatory Visit | Attending: Urology | Admitting: Urology

## 2020-08-10 DIAGNOSIS — C61 Malignant neoplasm of prostate: Secondary | ICD-10-CM | POA: Diagnosis present

## 2020-08-10 DIAGNOSIS — Z01818 Encounter for other preprocedural examination: Secondary | ICD-10-CM | POA: Diagnosis not present

## 2020-08-10 NOTE — Progress Notes (Signed)
°  Radiation Oncology         [phone] ________________________________  Name: HARSHAL SIRMON MRN: [id number]  Date: 08/10/2020  DOB: Dec 11, 1940  SIMULATION AND TREATMENT PLANNING NOTE PUBIC ARCH STUDY  CC:Lam, Rudi Rummage, NP  Davis Gourd*  DIAGNOSIS:  80 y.o. gentleman with Stage T1c adenocarcinoma of the prostate with Gleason score of 3+4, and PSA of 4.65.  Oncology History   No history exists.      ICD-10-CM   1. Malignant neoplasm of prostate ([place])  C61     COMPLEX SIMULATION:  The patient presented today for evaluation for possible prostate seed implant. He was brought to the radiation planning suite and placed supine on the CT couch. A 3-dimensional image study set was obtained in upload to the planning computer. There, on each axial slice, I contoured the prostate gland. Then, using three-dimensional radiation planning tools I reconstructed the prostate in view of the structures from the transperineal needle pathway to assess for possible pubic arch interference. In doing so, I did not appreciate any pubic arch interference. Also, the patient's prostate volume was estimated based on the drawn structure. The volume was 57 cc.  Given the pubic arch appearance and prostate volume, patient remains a good candidate to proceed with prostate seed implant. Today, he freely provided informed written consent to proceed.    PLAN: The patient will undergo prostate seed implant.   ________________________________  Sheral Apley. Tammi Klippel, M.D.

## 2020-08-30 DIAGNOSIS — L819 Disorder of pigmentation, unspecified: Secondary | ICD-10-CM | POA: Diagnosis not present

## 2020-08-30 DIAGNOSIS — Z85828 Personal history of other malignant neoplasm of skin: Secondary | ICD-10-CM | POA: Diagnosis not present

## 2020-08-30 DIAGNOSIS — L218 Other seborrheic dermatitis: Secondary | ICD-10-CM | POA: Diagnosis not present

## 2020-08-30 DIAGNOSIS — L814 Other melanin hyperpigmentation: Secondary | ICD-10-CM | POA: Diagnosis not present

## 2020-08-30 DIAGNOSIS — D229 Melanocytic nevi, unspecified: Secondary | ICD-10-CM | POA: Diagnosis not present

## 2020-08-30 DIAGNOSIS — L821 Other seborrheic keratosis: Secondary | ICD-10-CM | POA: Diagnosis not present

## 2020-08-30 DIAGNOSIS — L905 Scar conditions and fibrosis of skin: Secondary | ICD-10-CM | POA: Diagnosis not present

## 2020-08-30 DIAGNOSIS — L570 Actinic keratosis: Secondary | ICD-10-CM | POA: Diagnosis not present

## 2020-09-11 ENCOUNTER — Telehealth: Payer: Self-pay | Admitting: *Deleted

## 2020-09-11 NOTE — Telephone Encounter (Signed)
CALLED PATIENT TO REMIND OF LABS AND COVID TESTING FOR 09-15-20, SPOKE WITH PATIENT AND HE IS AWARE OF THESE APPTS.

## 2020-09-13 ENCOUNTER — Other Ambulatory Visit: Payer: Self-pay

## 2020-09-13 ENCOUNTER — Encounter (HOSPITAL_BASED_OUTPATIENT_CLINIC_OR_DEPARTMENT_OTHER): Payer: Self-pay | Admitting: Urology

## 2020-09-13 NOTE — Progress Notes (Addendum)
Spoke w/ via phone for pre-op interview---pt and wife judy Lab needs dos---none-               Lab results------has lab appt 09-15-2020 900 am for cbc cmp pt ptt,  ekg 08-10-2020 epic, chest xray 08-10-2020 epic COVID test ------positive covid 06-28-2020 results chart and care everywhere Arrive at -------1015 09-19-2020 NPO after MN NO Solid Food.  Clear liquids from MN until---915 am then npo Medications to take morning of surgery -----finasteride, tamsulosin Diabetic medication -----n/a Patient Special Instructions -----fleets enema am of surgery Pre-Op special Istructions -----none Patient verbalized understanding of instructions that were given at this phone interview. Patient denies shortness of breath, chest pain, fever, cough at this phone interview.  [facility] dr [name] 04-14-2020 epic cardiac cath 12-20-2015 epiuc Exercise tolerance test 12-25-2016 epic,   cardiac clearance note and note to stop to stop aspirin 81 mg and plavix caitlin walker np 07-19-2020 chart/epic, last dose of 81 mg aspirin and plavix 09-12-2020 per pt

## 2020-09-15 ENCOUNTER — Encounter (HOSPITAL_COMMUNITY)
Admission: RE | Admit: 2020-09-15 | Discharge: 2020-09-15 | Disposition: A | Discharge status: Home / Self Care | Payer: Medicare Other | Source: Ambulatory Visit | Attending: Urology | Admitting: Urology

## 2020-09-15 ENCOUNTER — Other Ambulatory Visit (HOSPITAL_COMMUNITY): Payer: Medicare Other

## 2020-09-15 ENCOUNTER — Other Ambulatory Visit: Payer: Self-pay

## 2020-09-15 DIAGNOSIS — Z01812 Encounter for preprocedural laboratory examination: Secondary | ICD-10-CM | POA: Diagnosis not present

## 2020-09-15 DIAGNOSIS — Z20822 Contact with and (suspected) exposure to covid-19: Secondary | ICD-10-CM | POA: Insufficient documentation

## 2020-09-15 LAB — COMPREHENSIVE METABOLIC PANEL
ALT: 19 U/L (ref 0–44)
AST: 20 U/L (ref 15–41)
Albumin: 4.4 g/dL (ref 3.5–5.0)
Alkaline Phosphatase: 42 U/L (ref 38–126)
Anion gap: 7 (ref 5–15)
BUN: 15 mg/dL (ref 8–23)
CO2: 28 mmol/L (ref 22–32)
Calcium: 9.7 mg/dL (ref 8.9–10.3)
Chloride: 102 mmol/L (ref 98–111)
Creatinine, Ser: 0.83 mg/dL (ref 0.61–1.24)
GFR, Estimated: >60 mL/min (ref >60)
Glucose, Bld: 109 mg/dL — ABNORMAL HIGH (ref 70–99)
Potassium: 4.1 mmol/L (ref 3.5–5.1)
Sodium: 137 mmol/L (ref 135–145)
Total Bilirubin: 0.6 mg/dL (ref 0.3–1.2)
Total Protein: 7.1 g/dL (ref 6.5–8.1)

## 2020-09-15 LAB — APTT: aPTT: 27 seconds (ref 24–36)

## 2020-09-15 LAB — CBC
HCT: 42.7 % (ref 39.0–52.0)
Hemoglobin: 14.6 g/dL (ref 13.0–17.0)
MCH: 30.5 pg (ref 26.0–34.0)
MCHC: 34.2 g/dL (ref 30.0–36.0)
MCV: 89.3 fL (ref 80.0–100.0)
Platelets: 213 10*3/uL (ref 150–400)
RBC: 4.78 MIL/uL (ref 4.22–5.81)
RDW: 13.4 % (ref 11.5–15.5)
WBC: 6 10*3/uL (ref 4.0–10.5)
nRBC: 0 % (ref 0.0–0.2)

## 2020-09-15 LAB — PROTIME-INR
INR: 1.1 (ref 0.8–1.2)
Prothrombin Time: 13.7 seconds (ref 11.4–15.2)

## 2020-09-18 ENCOUNTER — Telehealth: Payer: Self-pay | Admitting: *Deleted

## 2020-09-18 NOTE — Telephone Encounter (Signed)
CALLED PATIENT TO REMIND OF PROCEDURE FOR 09-19-20, SPOKE WITH PATIENT AND HE IS AWARE OF THIS PROCEDURE

## 2020-09-19 ENCOUNTER — Ambulatory Visit (HOSPITAL_BASED_OUTPATIENT_CLINIC_OR_DEPARTMENT_OTHER): Payer: Medicare Other | Admitting: Anesthesiology

## 2020-09-19 ENCOUNTER — Other Ambulatory Visit: Payer: Self-pay

## 2020-09-19 ENCOUNTER — Ambulatory Visit (HOSPITAL_BASED_OUTPATIENT_CLINIC_OR_DEPARTMENT_OTHER)
Admission: RE | Admit: 2020-09-19 | Discharge: 2020-09-19 | Disposition: A | Discharge status: Home / Self Care | Payer: Medicare Other | Source: Other Acute Inpatient Hospital | Attending: Urology | Admitting: Urology

## 2020-09-19 ENCOUNTER — Encounter (HOSPITAL_BASED_OUTPATIENT_CLINIC_OR_DEPARTMENT_OTHER): Payer: Self-pay | Admitting: Urology

## 2020-09-19 ENCOUNTER — Ambulatory Visit (HOSPITAL_COMMUNITY): Payer: Medicare Other

## 2020-09-19 ENCOUNTER — Encounter (HOSPITAL_BASED_OUTPATIENT_CLINIC_OR_DEPARTMENT_OTHER)
Admission: RE | Disposition: A | Discharge status: Home / Self Care | Payer: Self-pay | Source: Other Acute Inpatient Hospital | Attending: Urology

## 2020-09-19 DIAGNOSIS — Z87891 Personal history of nicotine dependence: Secondary | ICD-10-CM | POA: Insufficient documentation

## 2020-09-19 DIAGNOSIS — Z8719 Personal history of other diseases of the digestive system: Secondary | ICD-10-CM | POA: Insufficient documentation

## 2020-09-19 DIAGNOSIS — Z8249 Family history of ischemic heart disease and other diseases of the circulatory system: Secondary | ICD-10-CM | POA: Diagnosis not present

## 2020-09-19 DIAGNOSIS — I10 Essential (primary) hypertension: Secondary | ICD-10-CM | POA: Insufficient documentation

## 2020-09-19 DIAGNOSIS — I2510 Atherosclerotic heart disease of native coronary artery without angina pectoris: Secondary | ICD-10-CM | POA: Diagnosis not present

## 2020-09-19 DIAGNOSIS — R7303 Prediabetes: Secondary | ICD-10-CM | POA: Diagnosis not present

## 2020-09-19 DIAGNOSIS — I252 Old myocardial infarction: Secondary | ICD-10-CM | POA: Diagnosis not present

## 2020-09-19 DIAGNOSIS — Z808 Family history of malignant neoplasm of other organs or systems: Secondary | ICD-10-CM | POA: Diagnosis not present

## 2020-09-19 DIAGNOSIS — Z86010 Personal history of colonic polyps: Secondary | ICD-10-CM | POA: Insufficient documentation

## 2020-09-19 DIAGNOSIS — E785 Hyperlipidemia, unspecified: Secondary | ICD-10-CM | POA: Insufficient documentation

## 2020-09-19 DIAGNOSIS — C61 Malignant neoplasm of prostate: Secondary | ICD-10-CM | POA: Diagnosis present

## 2020-09-19 DIAGNOSIS — Z8616 Personal history of COVID-19: Secondary | ICD-10-CM | POA: Diagnosis not present

## 2020-09-19 DIAGNOSIS — Z800 Family history of malignant neoplasm of digestive organs: Secondary | ICD-10-CM | POA: Diagnosis not present

## 2020-09-19 DIAGNOSIS — Z955 Presence of coronary angioplasty implant and graft: Secondary | ICD-10-CM | POA: Insufficient documentation

## 2020-09-19 DIAGNOSIS — Z85828 Personal history of other malignant neoplasm of skin: Secondary | ICD-10-CM | POA: Diagnosis not present

## 2020-09-19 HISTORY — PX: CYSTOSCOPY: SHX5120

## 2020-09-19 HISTORY — PX: RADIOACTIVE SEED IMPLANT: SHX5150

## 2020-09-19 HISTORY — PX: SPACE OAR INSTILLATION: SHX6769

## 2020-09-19 HISTORY — DX: Personal history of other diseases of the nervous system and sense organs: Z86.69

## 2020-09-19 HISTORY — DX: Personal history of urinary calculi: Z87.442

## 2020-09-19 SURGERY — INSERTION, RADIATION SOURCE, PROSTATE
Anesthesia: General | Site: Rectum

## 2020-09-19 MED ORDER — DEXAMETHASONE SODIUM PHOSPHATE 10 MG/ML IJ SOLN
INTRAMUSCULAR | Status: AC
  Filled 2020-09-19: qty 1

## 2020-09-19 MED ORDER — DOCUSATE SODIUM 100 MG PO CAPS: 100.0000 mg | ORAL_CAPSULE | Freq: Every day | ORAL | 0 refills | Status: AC | PRN

## 2020-09-19 MED ORDER — FENTANYL CITRATE (PF) 100 MCG/2ML IJ SOLN
INTRAMUSCULAR | Status: AC
  Filled 2020-09-19: qty 2

## 2020-09-19 MED ORDER — OXYCODONE HCL 5 MG PO TABS: 5.0000 mg | ORAL_TABLET | Freq: Once | ORAL | Status: DC | PRN

## 2020-09-19 MED ORDER — ACETAMINOPHEN 500 MG PO TABS
ORAL_TABLET | ORAL | Status: AC
  Filled 2020-09-19: qty 2

## 2020-09-19 MED ORDER — AMISULPRIDE (ANTIEMETIC) 5 MG/2ML IV SOLN: 10.0000 mg | Freq: Once | INTRAVENOUS | Status: DC | PRN

## 2020-09-19 MED ORDER — LACTATED RINGERS IV SOLN: INTRAVENOUS | Status: DC

## 2020-09-19 MED ORDER — IOHEXOL 300 MG/ML  SOLN
INTRAMUSCULAR | Status: DC | PRN
  Administered 2020-09-19: 7 mL

## 2020-09-19 MED ORDER — PHENYLEPHRINE 40 MCG/ML (10ML) SYRINGE FOR IV PUSH (FOR BLOOD PRESSURE SUPPORT)
PREFILLED_SYRINGE | INTRAVENOUS | Status: AC
  Filled 2020-09-19: qty 10

## 2020-09-19 MED ORDER — PROPOFOL 10 MG/ML IV BOLUS
INTRAVENOUS | Status: DC | PRN
  Administered 2020-09-19: 150 mg via INTRAVENOUS
  Administered 2020-09-19: 20 mg via INTRAVENOUS

## 2020-09-19 MED ORDER — WHITE PETROLATUM EX OINT
TOPICAL_OINTMENT | CUTANEOUS | Status: AC
  Filled 2020-09-19: qty 5

## 2020-09-19 MED ORDER — ACETAMINOPHEN 325 MG PO TABS
ORAL_TABLET | ORAL | Status: DC | PRN
  Administered 2020-09-19: 1000 mg via ORAL

## 2020-09-19 MED ORDER — CIPROFLOXACIN IN D5W 400 MG/200ML IV SOLN
400.0000 mg | INTRAVENOUS | Status: AC
  Administered 2020-09-19: 400 mg via INTRAVENOUS

## 2020-09-19 MED ORDER — PROPOFOL 10 MG/ML IV BOLUS
INTRAVENOUS | Status: AC
  Filled 2020-09-19: qty 20

## 2020-09-19 MED ORDER — OXYCODONE-ACETAMINOPHEN 5-325 MG PO TABS: 1.0000 | ORAL_TABLET | ORAL | 0 refills | Status: AC | PRN

## 2020-09-19 MED ORDER — ONDANSETRON HCL 4 MG/2ML IJ SOLN
INTRAMUSCULAR | Status: AC
  Filled 2020-09-19: qty 2

## 2020-09-19 MED ORDER — FENTANYL CITRATE (PF) 100 MCG/2ML IJ SOLN: 25.0000 ug | INTRAMUSCULAR | Status: DC | PRN

## 2020-09-19 MED ORDER — ACETAMINOPHEN 10 MG/ML IV SOLN: 1000.0000 mg | Freq: Once | INTRAVENOUS | Status: DC | PRN

## 2020-09-19 MED ORDER — ACETAMINOPHEN 160 MG/5ML PO SOLN: 325.0000 mg | Freq: Once | ORAL | Status: DC | PRN

## 2020-09-19 MED ORDER — SODIUM CHLORIDE (PF) 0.9 % IJ SOLN
INTRAMUSCULAR | Status: DC | PRN
  Administered 2020-09-19: 10 mL

## 2020-09-19 MED ORDER — OXYCODONE HCL 5 MG/5ML PO SOLN: 5.0000 mg | Freq: Once | ORAL | Status: DC | PRN

## 2020-09-19 MED ORDER — PHENYLEPHRINE HCL (PRESSORS) 10 MG/ML IV SOLN
INTRAVENOUS | Status: AC
  Filled 2020-09-19: qty 1

## 2020-09-19 MED ORDER — PHENYLEPHRINE 40 MCG/ML (10ML) SYRINGE FOR IV PUSH (FOR BLOOD PRESSURE SUPPORT)
PREFILLED_SYRINGE | INTRAVENOUS | Status: DC | PRN
  Administered 2020-09-19 (×2): 120 ug via INTRAVENOUS
  Administered 2020-09-19: 160 ug via INTRAVENOUS

## 2020-09-19 MED ORDER — SODIUM CHLORIDE 0.9 % IV SOLN
INTRAVENOUS | Status: AC | PRN
  Administered 2020-09-19: 1000 mL

## 2020-09-19 MED ORDER — ONDANSETRON HCL 4 MG/2ML IJ SOLN
INTRAMUSCULAR | Status: DC | PRN
  Administered 2020-09-19: 4 mg via INTRAVENOUS

## 2020-09-19 MED ORDER — FENTANYL CITRATE (PF) 100 MCG/2ML IJ SOLN
INTRAMUSCULAR | Status: DC | PRN
  Administered 2020-09-19 (×2): 50 ug via INTRAVENOUS
  Administered 2020-09-19: 25 ug via INTRAVENOUS

## 2020-09-19 MED ORDER — DEXAMETHASONE SODIUM PHOSPHATE 10 MG/ML IJ SOLN
INTRAMUSCULAR | Status: DC | PRN
  Administered 2020-09-19: 10 mg via INTRAVENOUS

## 2020-09-19 MED ORDER — LIDOCAINE 2% (20 MG/ML) 5 ML SYRINGE
INTRAMUSCULAR | Status: DC | PRN
  Administered 2020-09-19: 40 mg via INTRAVENOUS

## 2020-09-19 MED ORDER — LIDOCAINE HCL (PF) 2 % IJ SOLN
INTRAMUSCULAR | Status: AC
  Filled 2020-09-19: qty 5

## 2020-09-19 MED ORDER — CIPROFLOXACIN IN D5W 400 MG/200ML IV SOLN
INTRAVENOUS | Status: AC
  Filled 2020-09-19: qty 200

## 2020-09-19 MED ORDER — ACETAMINOPHEN 325 MG PO TABS: 325.0000 mg | ORAL_TABLET | Freq: Once | ORAL | Status: DC | PRN

## 2020-09-19 MED ORDER — CEPHALEXIN 500 MG PO CAPS: 500.0000 mg | ORAL_CAPSULE | Freq: Two times a day (BID) | ORAL | 0 refills | Status: AC

## 2020-09-19 MED ORDER — PHENYLEPHRINE HCL-NACL 10-0.9 MG/250ML-% IV SOLN
INTRAVENOUS | Status: DC | PRN
  Administered 2020-09-19: 50 ug/min via INTRAVENOUS

## 2020-09-19 MED ORDER — MEPERIDINE HCL 25 MG/ML IJ SOLN: 6.2500 mg | INTRAMUSCULAR | Status: DC | PRN

## 2020-09-19 SURGICAL SUPPLY — 37 items
BAG URINE DRAIN 2000ML AR STRL (UROLOGICAL SUPPLIES) ×4 IMPLANT
BLADE CLIPPER SENSICLIP SURGIC (BLADE) ×4 IMPLANT
CATH FOLEY 2WAY SLVR  5CC 16FR (CATHETERS) ×1
CATH FOLEY 2WAY SLVR 5CC 16FR (CATHETERS) ×3 IMPLANT
CATH ROBINSON RED A/P 16FR (CATHETERS) IMPLANT
CATH ROBINSON RED A/P 20FR (CATHETERS) ×4 IMPLANT
CLOTH BEACON ORANGE TIMEOUT ST (SAFETY) ×4 IMPLANT
CNTNR URN SCR LID CUP LEK RST (MISCELLANEOUS) ×6 IMPLANT
CONT SPEC 4OZ STRL OR WHT (MISCELLANEOUS) ×2
COVER BACK TABLE 60X90IN (DRAPES) ×4 IMPLANT
COVER MAYO STAND STRL (DRAPES) ×4 IMPLANT
DRAPE C-ARM 35X43 STRL (DRAPES) ×4 IMPLANT
DRSG TEGADERM 4X4.75 (GAUZE/BANDAGES/DRESSINGS) ×4 IMPLANT
DRSG TEGADERM 8X12 (GAUZE/BANDAGES/DRESSINGS) ×4 IMPLANT
GAUZE SPONGE 4X4 12PLY STRL (GAUZE/BANDAGES/DRESSINGS) ×4 IMPLANT
GLOVE SURG ENC MOIS LTX SZ6 (GLOVE) IMPLANT
GLOVE SURG ENC MOIS LTX SZ6.5 (GLOVE) IMPLANT
GLOVE SURG ENC MOIS LTX SZ7 (GLOVE) ×4 IMPLANT
GLOVE SURG ENC MOIS LTX SZ8 (GLOVE) ×4 IMPLANT
GLOVE SURG ORTHO LTX SZ8.5 (GLOVE) IMPLANT
GLOVE SURG POLYISO LF SZ6.5 (GLOVE) IMPLANT
GLOVE SURG UNDER POLY LF SZ7.5 (GLOVE) ×8 IMPLANT
GOWN STRL REUS W/TWL LRG LVL3 (GOWN DISPOSABLE) ×4 IMPLANT
HOLDER FOLEY CATH W/STRAP (MISCELLANEOUS) IMPLANT
I-SEED AGX100 ×204 IMPLANT
IMPL SPACEOAR VUE SYSTEM (Spacer) ×3 IMPLANT
IMPLANT SPACEOAR VUE SYSTEM (Spacer) ×4 IMPLANT
IV NS 1000ML (IV SOLUTION) ×1
IV NS 1000ML BAXH (IV SOLUTION) ×3 IMPLANT
KIT TURNOVER CYSTO (KITS) ×4 IMPLANT
MARKER SKIN DUAL TIP RULER LAB (MISCELLANEOUS) ×4 IMPLANT
PACK CYSTO (CUSTOM PROCEDURE TRAY) ×4 IMPLANT
SUT BONE WAX W31G (SUTURE) IMPLANT
SYR 10ML LL (SYRINGE) ×4 IMPLANT
TOWEL OR 17X26 10 PK STRL BLUE (TOWEL DISPOSABLE) ×4 IMPLANT
UNDERPAD 30X36 HEAVY ABSORB (UNDERPADS AND DIAPERS) ×8 IMPLANT
WATER STERILE IRR 500ML POUR (IV SOLUTION) ×4 IMPLANT

## 2020-09-19 NOTE — Anesthesia Procedure Notes (Deleted)
Procedure Name: LMA Insertion Date/Time: 09/19/2020 12:36 PM Performed by: Mechele Claude, CRNA Pre-anesthesia Checklist: Patient identified, Emergency Drugs available, Suction available and Patient being monitored Patient Re-evaluated:Patient Re-evaluated prior to induction Oxygen Delivery Method: Circle system utilized Preoxygenation: Pre-oxygenation with 100% oxygen Induction Type: IV induction Ventilation: Mask ventilation without difficulty LMA: LMA inserted LMA Size: 4.0 Number of attempts: 1 Airway Equipment and Method: Bite block Placement Confirmation: positive ETCO2 Tube secured with: Tape Dental Injury: Teeth and Oropharynx as per pre-operative assessment

## 2020-09-19 NOTE — Anesthesia Procedure Notes (Signed)
Procedure Name: LMA Insertion Date/Time: 09/19/2020 12:37 PM Performed by: Mechele Claude, CRNA Pre-anesthesia Checklist: Patient identified, Emergency Drugs available, Suction available and Patient being monitored Patient Re-evaluated:Patient Re-evaluated prior to induction Oxygen Delivery Method: Circle system utilized Preoxygenation: Pre-oxygenation with 100% oxygen Induction Type: IV induction Ventilation: Mask ventilation without difficulty LMA: LMA inserted LMA Size: 4.0 Number of attempts: 1 Airway Equipment and Method: Bite block Placement Confirmation: positive ETCO2 Tube secured with: Tape Dental Injury: Teeth and Oropharynx as per pre-operative assessment

## 2020-09-19 NOTE — H&P (Signed)
Urology Preoperative H&P   Chief Complaint: Prostate cancer  History of Present Illness: Kevin Stewart is a 80 y.o. male with a history of T1c, Gleason 3+4 prostate cancer and kidney stones.   Last PSA- 4.65 (04/2020), 5.0 (02/2020), 4.4 (01/2020). No family hx of prostate cancer.  Biopsy Date: 05/26/20  TNM stage: T1c  Gleason score: Gleason 3+4  Left: One core with grade 2 and five cores with grade 1 prostate cancer  Right: NED  Prostate volume: 62.7 cm^3   He is doing well, no complaints. Denies fevers. He is here for brachytherapy.   Past Medical History:  Diagnosis Date  . Arthritis    "hands" (12/20/2015)  . Basal cell carcinoma, face   . Coronary artery disease    a. 2003 - light MI per patient; s/p Cypher DES to prox LAD in [facility].  Marland Kitchen COVID 06/28/2020   cough, headache fever x 2 weeks all symptoms resolved, took monoclonal antibodies infusion  . History of kidney stones 1975, 2021   passed them 1975,  2021 sx done  . History of migraine    none since mi in 2000  . Hx of adenomatous colonic polyps   . Hyperlipemia   . Hyperlipidemia   . Hypertension   . Melanoma of right side of neck ([place])    removed posterior neck [id number]/[id number]  . Myocardial infarction ([place]) 2000  . Pre-diabetes    checks cbg q month  . Prostate cancer ([place])   . Wears dentures    upper-lower partial  . Wears glasses     Past Surgical History:  Procedure Laterality Date  . CARDIAC CATHETERIZATION N/A 12/20/2015   Procedure: Left Heart Cath and Coronary Angiography;  Surgeon: Belva Crome, MD;  Location: [place] [facility];  Service: Cardiovascular;  Laterality: N/A;  . CARDIAC CATHETERIZATION N/A 12/20/2015   Procedure: Coronary Stent Intervention;  Surgeon: Belva Crome, MD;  Location: [place] [facility];  Service: Cardiovascular;  Laterality: N/A;  . CATARACT EXTRACTION W/ INTRAOCULAR LENS  IMPLANT, BILATERAL Bilateral 2011  . CIRCUMCISION  age 22  . COLONOSCOPY  2012  . CORONARY  ANGIOPLASTY WITH STENT PLACEMENT  2000   "1 stent"  . CORONARY ANGIOPLASTY WITH STENT PLACEMENT  12/20/2015   "1 stent"  . CYSTOSCOPY/URETEROSCOPY/HOLMIUM LASER/STENT PLACEMENT Right 08/03/2019   Procedure: CYSTOSCOPY/RETROGRADE/URETEROSCOPY//STENT PLACEMENT;  Surgeon: Ceasar Mons, MD;  Location: [facility];  Service: Urology;  Laterality: Right;  . HEMORRHOID SURGERY     "burned them off"  . MELANOMA EXCISION WITH SENTINEL LYMPH NODE BIOPSY  07/21/2012   Procedure: MELANOMA EXCISION WITH SENTINEL LYMPH NODE BIOPSY;  Surgeon: Rozetta Nunnery, MD;  Location: [facility];  Service: General;  Laterality: N/A;  melanoma excision with sentinel node biopsy   posterior right neck  . PROSTATE BIOPSY  06/2020  . rotator cuff surgery  08/2019   [facility]  . TONSILLECTOMY  1961    Allergies:  Allergies  Allergen Reactions  . Shrimp [Shellfish Allergy] Nausea And Vomiting    Family History  Problem Relation Age of Onset  . Heart attack Mother 86  . Cancer - Other Father 84       pancreatic  . Pancreatic cancer Father   . Heart attack Brother 68  . Cancer - Colon Brother 68  . Cancer - Other Sister 19  . Pancreatic cancer Sister   . Prostate cancer Neg Hx   . Breast cancer Neg  Hx     Social History:  reports that he quit smoking about 47 years ago. His smoking use included cigarettes. He has a 20.00 pack-year smoking history. He has quit using smokeless tobacco.  His smokeless tobacco use included chew. He reports previous alcohol use. He reports that he does not use drugs.  ROS: A complete review of systems was performed.  All systems are negative except for pertinent findings as noted.  Physical Exam:  Vital signs in last 24 hours: Temp:  [97.8 F (36.6 C)] 97.8 F (36.6 C) (03/01 1017) Pulse Rate:  [74] 74 (03/01 1017) Resp:  [12] 12 (03/01 1017) BP: (151)/(76) 151/76 (03/01 1017) SpO2:  [99 %] 99 % (03/01  1017) Weight:  [76.1 kg] 76.1 kg (03/01 1017) Constitutional:  Alert and oriented, No acute distress Cardiovascular: Regular rate and rhythm Respiratory: Normal respiratory effort, Lungs clear bilaterally GI: Abdomen is soft, nontender, nondistended, no abdominal masses GU: No CVA tenderness Lymphatic: No lymphadenopathy Neurologic: Grossly intact, no focal deficits Psychiatric: Normal mood and affect  Laboratory Data:  No results for input(s): WBC, HGB, HCT, PLT in the last 72 hours.  No results for input(s): NA, K, CL, GLUCOSE, BUN, CALCIUM, CREATININE in the last 72 hours.  Invalid input(s): CO3   No results found for this or any previous visit (from the past 24 hour(s)). No results found for this or any previous visit (from the past 240 hour(s)).  Renal Function: Recent Labs    09/15/20 0920  CREATININE 0.83   Estimated Creatinine Clearance: 78.1 mL/min (by C-G formula based on SCr of 0.83 mg/dL).  Radiologic Imaging: No results found.  I independently reviewed the above imaging studies.  Assessment and Plan Kevin Stewart is a 80 y.o. male with prostate cancer here for brachytherapy and Space OAR.    Brachytherapy/space OAR consent- The patient was counseled about the natural history of prostate cancer and the standard treatment options that are available for prostate cancer. It was explained to him how his age and life expectancy, clinical stage, Gleason score, and PSA affect his prognosis, the decision to proceed with additional staging studies, as well as how that information influences recommended treatment strategies. We discussed the roles for active surveillance, radiation therapy, surgical therapy, androgen deprivation, as well as ablative therapy options for the treatment of prostate cancer as appropriate to his individual cancer situation. We discussed the risks and benefits of these options with regard to their impact on cancer control and also in terms of  potential adverse events, complications, and impact on quality of life particularly related to urinary and sexual function. The patient was encouraged to ask questions throughout the discussion today and all questions were answered to his stated satisfaction. In addition, the patient was provided with and/or directed to appropriate resources and literature for further education about prostate cancer and treatment options.   The patient has decided to proceed with brachytherapy and SpaceOAR placement as primary treatment of his intermediate risk prostate cancer.  The risks, benefits and alternatives of the aforementioned procedures was discussed in detail.  Risks include, bur are not limited to worsening LUTS, erectile dysfunction, rectal irritation, urethral stricture formation, fistula formation, cancer recurrence, MI, CVA, PE, DVT and the inherent risk of general anesthesia.  He voices understanding and wishes to proceed.    Matt R. Staisha Winiarski MD 09/19/2020, 10:21 AM  [facility] Pager: [phone]

## 2020-09-19 NOTE — Discharge Instructions (Signed)
Activity:  You are encouraged to ambulate frequently (about every hour during waking hours) to help prevent blood clots from forming in your legs or lungs.     Diet: You should advance your diet as instructed by your physician.  It will be normal to have some bloating, nausea, and abdominal discomfort intermittently.   Prescriptions:  You will be provided a prescription for pain medication to take as needed.  If your pain is not severe enough to require the prescription pain medication, you may take extra strength Tylenol instead which will have less side effects.  You should also take a prescribed stool softener to avoid straining with bowel movements as the prescription pain medication may constipate you.   What to call us about: You should call the office [phone]) if you develop fever > 101 or develop persistent vomiting. Activity:  You are encouraged to ambulate frequently (about every hour during waking hours) to help prevent blood clots from forming in your legs or lungs.      Brachytherapy for Prostate Cancer, Care After This sheet gives you information about how to care for yourself after your procedure. Your health care provider may also give you more specific instructions. If you have problems or questions, contact your health care provider. What can I expect after the procedure? After the procedure, it is common to have:  Urinary symptoms. These may include: ? Trouble passing urine. ? Blood in the urine or semen. ? Frequent feeling of an urgent need to urinate.  Constipation, nausea, or bloating and gas.  Bruising, swelling, and tenderness of the area beneath the scrotum (perineum).  Tiredness (fatigue).  Burning or pain in the rectum.  Problems getting or keeping an erection (erectile dysfunction). Follow these instructions at home: Eating and drinking  Drink enough fluid to keep your urine pale yellow.  Eat a healthy, balanced diet. This includes lean  proteins, whole grains, and plenty of fruits and vegetables.  If you drink alcohol: ? Limit how much you have to 0-2 drinks a day. ? Be aware of how much alcohol is in your drink. In the [place] one drink equals one 12 oz bottle of beer (355 mL), one 5 oz glass of wine (148 mL), or one 1 oz glass of hard liquor (44 mL).   Managing pain, stiffness, and swelling  If directed, put ice on the affected area. To do this: ? Put ice in a plastic bag. ? Place a towel between your skin and the bag. ? Leave the ice on for 20 minutes, 2-3 times a day.  Try not to sit directly on the perineum. A soft cushion can help with discomfort.   Activity  If you were given a sedative during the procedure, it can affect you for several hours. Do not drive or operate machinery until your health care provider says that it is safe.  Do not lift anything that is heavier than 10 lb (4.5 kg), or the limit that you are told, until your health care provider says that it is safe.  Rest as told by your health care provider.  Return to your normal activities as told by your health care provider. Most people can return to normal activities a few days or weeks after the procedure. Ask your health care provider what activities are safe for you.   Treatment area care Check your treatment area every day for signs of infection. Check for:  Redness, swelling, or pain.  Fluid or blood.  Warmth.  Pus or a bad smell. Managing constipation Your procedure may cause constipation. To prevent or treat constipation, you may need to:  Take over-the-counter or prescription medicines.  Eat foods that are high in fiber, such as beans, whole grains, and fresh fruits and vegetables.  Limit foods that are high in fat and processed sugars, such as fried or sweet foods. General instructions  Take over-the-counter and prescription medicines only as told by your health care provider.  Do not take baths, swim, or use a hot tub until  your health care provider approves. Shower and wash the perineum gently.  Do not have sex for one week after the treatment, or until your health care provider approves.  Do not use any products that contain nicotine or tobacco, such as cigarettes, e-cigarettes, and chewing tobacco. If you need help quitting, ask your health care provider.  If you have permanent, low-dose brachytherapy implants: ? Limit close contact with children and pregnant women for 2 months or as told by your health care provider. This is important because of the radiation that is still active in the prostate. ? You may set off radioactive sensors, such as at airport screenings. Ask your health care provider for a document that explains your treatment. ? You may be told to use a condom during sex for the first 2 months after low-dose brachytherapy.  Keep all follow-up visits as told by your health care provider. This is important. You may still need additional treatment. Contact a health care provider if you:  Have a fever or chills.  Have any of these signs of infection in the treatment area: ? Redness, swelling, or pain. ? Fluid or blood. ? Warmth. ? Pus or a bad smell.  Have no bowel movements for 3-4 days after the procedure.  Have diarrhea for 3-4 days after the procedure.  Develop any new symptoms, such as problems with urinating or erectile dysfunction.  Have pain in your abdomen.  Have more blood in your urine.  Have swelling or pain in your legs. Get help right away if:  You cannot urinate.  You have a lot of bleeding from your rectum.  You have unusual drainage coming from your rectum.  You have severe pain in the treated area that does not go away with pain medicine.  You have severe nausea or vomiting.  You have difficulty breathing. Summary  Talk with your health care provider about your risk of brachytherapy side effects, such as erectile dysfunction or urinary problems.  If you  have permanent, low-dose brachytherapy implants, limit close contact with children and pregnant women for 2 months or as told by your health care provider. This is important because of the radiation that is still active in the prostate.  You may be told to use a condom during sex for the first 2 months after low-dose brachytherapy.  Keep all follow-up visits as told by your health care provider. This is important. You may need additional treatment. This information is not intended to replace advice given to you by your health care provider. Make sure you discuss any questions you have with your health care provider. Document Revised: 05/10/2019 Document Reviewed: 05/10/2019 Elsevier Patient Education  2021 [place].   Hydrogel Spacer Implantation for Prostate Cancer, Care After This sheet gives you information about how to care for yourself after your procedure. Your health care provider may also give you more specific instructions. If you have problems or questions, contact your health care provider. What can  I expect after the procedure? After the procedure, it is common to have:  A feeling of fullness in your rectum for a few days.  Pink-colored urine for a short time. This should clear as you increase your fluid intake. Follow these instructions at home: Medicines  Take over-the-counter and prescription medicines only as told by your health care provider.  If you were prescribed an antibiotic medicine, take it as told by your health care provider. Do not stop taking the antibiotic even if you start to feel better. Activity  Do not drive for 24 hours if you were given a sedative during your procedure.  Return to your normal activities as told by your health care provider. Ask your health care provider what activities are safe for you.  Do not lift anything that is heavier than 10 lb (4.5 kg), or the limit that you are told, until your health care provider says that it is safe.    Eating and drinking  Drink enough fluid to keep your urine pale yellow.  Eat foods that are high in fiber, such as beans, whole grains, and fresh fruits and vegetables.  Limit foods that are high in fat and processed sugars, such as fried or sweet foods.   General instructions  Do not place anything into your rectum for 3 months.  Keep all follow-up visits as told by your health care provider. This is important. ? You will need to return for an MRI test before starting your radiation treatment. Contact a health care provider if you have:  A fever.  Chills.  Rectal pain.  Pain when you urinate.  Trouble passing urine.  Blood in your urine or stool. Get help right away if you have:  Shortness of breath or problems breathing. Summary  After the procedure, it is normal to have a feeling of fullness in your rectum for a few days.  Take over-the-counter and prescription medicines only as told by your health care provider.  Return to your normal activities as told by your health care provider.  Contact your health care provider if you have chills, a fever, pain, or blood in your urine or stool. This information is not intended to replace advice given to you by your health care provider. Make sure you discuss any questions you have with your health care provider. Document Revised: 01/08/2018 Document Reviewed: 01/08/2018 Elsevier Patient Education  2021 [place] Instructions  Activity: Get plenty of rest for the remainder of the day. A responsible adult should stay with you for 24 hours following the procedure.  For the next 24 hours, DO NOT: -Drive a car -Paediatric nurse -Drink alcoholic beverages -Take any medication unless instructed by your physician -Make any legal decisions or sign important papers.  Meals: Start with liquid foods such as gelatin or soup. Progress to regular foods as tolerated. Avoid greasy, spicy, heavy foods.  If nausea and/or vomiting occur, drink only clear liquids until the nausea and/or vomiting subsides. Call your physician if vomiting continues.  Special Instructions/Symptoms: Your throat may feel dry or sore from the anesthesia or the breathing tube placed in your throat during surgery. If this causes discomfort, gargle with warm salt water. The discomfort should disappear within 24 hours.  If you had a scopolamine patch placed behind your ear for the management of post- operative nausea and/or vomiting:  1. The medication in the patch is effective for 72 hours, after which it should be removed.  Wrap  patch in a tissue and discard in the trash. Wash hands thoroughly with soap and water. 2. You may remove the patch earlier than 72 hours if you experience unpleasant side effects which may include dry mouth, dizziness or visual disturbances. 3. Avoid touching the patch. Wash your hands with soap and water after contact with the patch.

## 2020-09-19 NOTE — Progress Notes (Signed)
  Radiation Oncology         [phone] ________________________________  Name: Kevin Stewart MRN: [id number]  Date: 09/19/2020  DOB: October 16, 1940       Prostate Seed Implant  CC:Lam, Rudi Rummage, NP  No ref. provider found  DIAGNOSIS:    80 y.o. gentleman with Stage T1c adenocarcinoma of the prostate with Gleason score of 3+4, and PSA of 4.65.  Oncology History  Malignant neoplasm of prostate ([place])  05/30/2020 Cancer Staging   Staging form: Prostate, AJCC 8th Edition - Clinical stage from 05/30/2020: Stage IIB (cT1c, cN0, cM0, PSA: 4.7, Grade Group: 2) - Signed by Freeman Caldron, PA-C on 08/10/2020   08/10/2020 Initial Diagnosis   Malignant neoplasm of prostate ([place])    PROCEDURE: Insertion of radioactive I-125 seeds into the prostate gland.  RADIATION DOSE: 145 Gy, definitive therapy.  TECHNIQUE: Kevin Stewart was brought to the operating room with the urologist. He was placed in the dorsolithotomy position. He was catheterized and a rectal tube was inserted. The perineum was shaved, prepped and draped. The ultrasound probe was then introduced into the rectum to see the prostate gland.  TREATMENT DEVICE: A needle grid was attached to the ultrasound probe stand and anchor needles were placed.  3D PLANNING: The prostate was imaged in 3D using a sagittal sweep of the prostate probe. These images were transferred to the planning computer. There, the prostate, urethra and rectum were defined on each axial reconstructed image. Then, the software created an optimized 3D plan and a few seed positions were adjusted. The quality of the plan was reviewed using [facility] information for the target and the following two organs at risk:  Urethra and Rectum.  Then the accepted plan was printed and handed off to the radiation therapist.  Under my supervision, the custom loading of the seeds and spacers was carried out and loaded into sealed vicryl sleeves.  These pre-loaded needles were then placed into the  needle holder.Marland Kitchen  PROSTATE VOLUME STUDY:  Using transrectal ultrasound the volume of the prostate was verified to be 43.7 cc.  SPECIAL TREATMENT PROCEDURE/SUPERVISION AND HANDLING: The pre-loaded needles were then delivered under sagittal guidance. A total of 17 needles were used to deposit 51 seeds in the prostate gland. The individual seed activity was 0.638 mCi.  SpaceOAR:  Yes  COMPLEX SIMULATION: At the end of the procedure, an anterior radiograph of the pelvis was obtained to document seed positioning and count. Cystoscopy was performed to check the urethra and bladder.  MICRODOSIMETRY: At the end of the procedure, the patient was emitting 0.12 mR/hr at 1 meter. Accordingly, he was considered safe for hospital discharge.  PLAN: The patient will return to the radiation oncology clinic for post implant CT dosimetry in three weeks.   ________________________________  Sheral Apley Tammi Klippel, M.D.

## 2020-09-19 NOTE — Transfer of Care (Signed)
Immediate Anesthesia Transfer of Care Note  Patient: Kevin Stewart  Procedure(s) Performed: RADIOACTIVE SEED IMPLANT/BRACHYTHERAPY IMPLANT (N/A Prostate) SPACE OAR INSTILLATION (N/A Rectum) CYSTOSCOPY FLEXIBLE (Bladder)  Patient Location: PACU  Anesthesia Type:General  Level of Consciousness: drowsy, patient cooperative and responds to stimulation  Airway & Oxygen Therapy: Patient Spontanous Breathing  Post-op Assessment: Report given to RN and Post -op Vital signs reviewed and stable  Post vital signs: Reviewed and stable  Last Vitals:  Vitals Value Taken Time  BP 125/66 09/19/20 1350  Temp 36.9 C 09/19/20 1350  Pulse 63 09/19/20 1352  Resp 9 09/19/20 1352  SpO2 96 % 09/19/20 1352  Vitals shown include unvalidated device data.  Last Pain:  Vitals:   09/19/20 1017  TempSrc: Oral  PainSc: 0-No pain         Complications: No complications documented.

## 2020-09-19 NOTE — Anesthesia Preprocedure Evaluation (Addendum)
Anesthesia Evaluation  Patient identified by MRN, date of birth, ID band Patient awake    Reviewed: Allergy & Precautions, NPO status , Patient's Chart, lab work & pertinent test results  Airway Mallampati: I  TM Distance: >3 FB Neck ROM: Full    Dental  (+) Edentulous Upper, Partial Lower, Dental Advisory Given   Pulmonary former smoker,    breath sounds clear to auscultation (-) decreased breath sounds      Cardiovascular hypertension, + CAD and + Cardiac Stents   Rhythm:Regular Rate:Normal     Neuro/Psych negative neurological ROS  negative psych ROS   GI/Hepatic negative GI ROS, Neg liver ROS,   Endo/Other  negative endocrine ROS  Renal/GU negative Renal ROS     Musculoskeletal  (+) Arthritis ,   Abdominal Normal abdominal exam  (+)   Peds  Hematology negative hematology ROS (+)   Anesthesia Other Findings - HLD  Reproductive/Obstetrics                            Anesthesia Physical Anesthesia Plan  ASA: III  Anesthesia Plan: General   Post-op Pain Management:    Induction: Intravenous  PONV Risk Score and Plan: 3 and Ondansetron, Dexamethasone and Amisulpride  Airway Management Planned: Oral ETT and LMA  Additional Equipment: None  Intra-op Plan:   Post-operative Plan: Extubation in OR  Informed Consent: I have reviewed the patients History and Physical, chart, labs and discussed the procedure including the risks, benefits and alternatives for the proposed anesthesia with the patient or authorized representative who has indicated his/her understanding and acceptance.     Dental advisory given  Plan Discussed with: CRNA  Anesthesia Plan Comments:        Anesthesia Quick Evaluation

## 2020-09-19 NOTE — Anesthesia Postprocedure Evaluation (Signed)
Anesthesia Post Note  Patient: Kevin Stewart  Procedure(s) Performed: RADIOACTIVE SEED IMPLANT/BRACHYTHERAPY IMPLANT (N/A Prostate) SPACE OAR INSTILLATION (N/A Rectum) CYSTOSCOPY FLEXIBLE (Bladder)     Patient location during evaluation: PACU Anesthesia Type: General Level of consciousness: awake and alert Pain management: pain level controlled Vital Signs Assessment: post-procedure vital signs reviewed and stable Respiratory status: spontaneous breathing, nonlabored ventilation, respiratory function stable and patient connected to nasal cannula oxygen Cardiovascular status: blood pressure returned to baseline and stable Postop Assessment: no apparent nausea or vomiting Anesthetic complications: no   No complications documented.  Last Vitals:  Vitals:   09/19/20 1415 09/19/20 1430  BP: 140/70 (!) 151/77  Pulse: 70 67  Resp: 10 10  Temp:    SpO2: 98% 98%    Last Pain:  Vitals:   09/19/20 1430  TempSrc:   PainSc: 0-No pain                 Effie Berkshire

## 2020-09-19 NOTE — Op Note (Signed)
PATIENT:  Kevin Stewart  PRE-OPERATIVE DIAGNOSIS:  Adenocarcinoma of the prostate  POST-OPERATIVE DIAGNOSIS:  Same  PROCEDURE:  1. I-125 radioactive seed implantation 2. Cystoscopy  3. Placement of SpaceOAR  SURGEON:  Rexene Alberts, MD  Radiation oncologist: Tyler Pita, MD  ANESTHESIA:  General  EBL:  Minimal  DRAINS: None  INDICATION: Kevin Stewart  Description of procedure: After informed consent the patient was brought to the major OR, placed on the table and administered general anesthesia. He was then moved to the modified lithotomy position with his perineum perpendicular to the floor. His perineum and genitalia were then sterilely prepped. An official timeout was then performed. A 16 French Foley catheter was then placed in the bladder and filled with dilute contrast, a rectal tube was placed in the rectum and the transrectal ultrasound probe was placed in the rectum and affixed to the stand. He was then sterilely draped.  Real time ultrasonography was used along with the seed planning software. This was used to develop the seed plan including the number of needles as well as number of seeds required for complete and adequate coverage. Real-time ultrasonography was then used along with the previously developed plan and the Nucletron device to implant a total of 51 seeds using 17 needles. This proceeded without difficulty or complication.   I then proceeded with placement of SpaceOAR by introducing a needle with the bevel angled inferiorly approximately 2 cm superior to the anus. This was angled downward and under direct ultrasound was placed within the space between the prostatic capsule and rectum. This was confirmed with a small amount of sterile saline injected and this was performed under direct ultrasound. I then attached the SpaceOAR to the needle and injected this in the space between the prostate and rectum with good placement noted.  A Foley catheter was then  removed as well as the transrectal ultrasound probe and rectal probe. Flexible cystoscopy was then performed using the 16 French flexible scope which revealed a normal urethra throughout its length down to the sphincter which appeared intact. The prostatic urethra revealed bilobar hypertrophy but no evidence of obstruction, seeds, spacers or lesions. The bladder was then entered and fully and systematically inspected. The ureteral orifices were noted to be of normal configuration and position. The mucosa revealed no evidence of tumors. There were also no stones identified within the bladder. I noted no seeds or spacers on the floor of the bladder and retroflexion of the scope revealed no seeds protruding from the base of the prostate.  The cystoscope was then removed and the patient was awakened and taken to recovery room in stable and satisfactory condition. He tolerated procedure well and there were no intraoperative complications.  [facility]  Pager: [phone]

## 2020-09-20 ENCOUNTER — Encounter (HOSPITAL_BASED_OUTPATIENT_CLINIC_OR_DEPARTMENT_OTHER): Payer: Self-pay | Admitting: Urology

## 2020-09-28 ENCOUNTER — Telehealth: Payer: Self-pay | Admitting: Radiation Oncology

## 2020-09-28 NOTE — Telephone Encounter (Signed)
Received voicemail message from patient requesting a return call. Patient explained he has "some questions about his seeds." Phoned patient back promptly. Noted patient had radioactive seed implant on 09/19/2020. Patient questions if dingy urine is normal. Patient denies hematuria. Explained he is most likely passing old blood from where the seeds were implanted in his prostate and may for a little longer than those patients who aren't on blood thinners. Encouraged patient to increase water intake and flush system. Then, patient questions if rectal discomfort is normal. Explained it is and should improve in the coming weeks. Encouraged patient to contact this RN should his symptoms become worse instead of better in the coming weeks. Patient verbalized understanding and appreciation for the call back. Patient states, "I feel better about things after speaking with you."

## 2020-10-18 ENCOUNTER — Telehealth: Payer: Self-pay | Admitting: *Deleted

## 2020-10-18 NOTE — Progress Notes (Signed)
Radiation Oncology         [phone] ________________________________  Name: ODESSA MORREN MRN: [id number]  Date: 10/19/2020  DOB: [birth year]  Post-Seed Follow-Up Visit Note  CC: [name], NP  [facility]*  Diagnosis:   80 y.o. gentleman with Stage T1c adenocarcinoma of the prostate with Gleason score of 3+4, and PSA of 4.65.    ICD-10-CM   1. Malignant neoplasm of prostate (HCC)  C61     Interval Since Last Radiation:  4 weeks 09/19/20:  Insertion of radioactive I-125 seeds into the prostate gland; 145 Gy, definitive therapy with placement of SpaceOAR VUE gel.  Narrative:  The patient returns today for routine follow-up.  He is complaining of increased urinary frequency and urinary hesitation symptoms. He filled out a questionnaire regarding urinary function today providing and overall IPSS score of 23 characterizing his symptoms as severe but improving.  His pre-implant score was 6. He reports a weaker flow of stream first thing in the morning but this improves as the day goes on. He also notes increased urgency and feelings of incomplete bladder emptying that is gradually improving and better later in the day. He specifically denies dysuria, gross hematuria, straining to void. Or incontinence. He reports a healthy appetite and is maintaining his weight. He denies any abdominal pain or bowel symptoms.  ALLERGIES:  is allergic to shrimp [shellfish allergy].  Meds: Current Outpatient Medications  Medication Sig Dispense Refill  . aspirin 81 MG tablet Take 81 mg by mouth daily.    . carvedilol (COREG) 12.5 MG tablet Take 12.5 mg by mouth at bedtime.    . clopidogrel (PLAVIX) 75 MG tablet Take 75 mg by mouth at bedtime.     Marland Kitchen co-enzyme Q-10 50 MG capsule Take 50 mg by mouth daily.    Marland Kitchen docusate sodium (COLACE) 100 MG capsule Take 1 capsule (100 mg total) by mouth daily as needed. 30 capsule 0  . Enalapril-Hydrochlorothiazide 5-12.5 MG tablet Take 1 tablet by mouth  daily.    . finasteride (PROSCAR) 5 MG tablet Take 1 tablet (5 mg total) by mouth daily. 30 tablet 2  . nitroGLYCERIN (NITROSTAT) 0.4 MG SL tablet Place 1 tablet (0.4 mg total) under the tongue every 5 (five) minutes as needed for chest pain (up to 3 doses). (Patient not taking: Reported on 06/27/2020) 25 tablet 3  . rosuvastatin (CRESTOR) 10 MG tablet Take 10 mg by mouth daily.    . tamsulosin (FLOMAX) 0.4 MG CAPS capsule Take 0.4 mg by mouth daily.     No current facility-administered medications for this encounter.    Physical Findings: In general this is a well appearing Caucasian male in no acute distress. He's alert and oriented x4 and appropriate throughout the examination. Cardiopulmonary assessment is negative for acute distress and he exhibits normal effort.   Lab Findings: Lab Results  Component Value Date   WBC 6.0 09/15/2020   HGB 14.6 09/15/2020   HCT 42.7 09/15/2020   MCV 89.3 09/15/2020   PLT 213 09/15/2020    Radiographic Findings:  Patient underwent CT imaging in our clinic for post implant dosimetry. The CT will be reviewed by Dr. Tammi Klippel to confirm there is an adequate distribution of radioactive seeds throughout the prostate gland and ensure that there are no seeds in or near the rectum.  We suspect the final radiation plan and dosimetry will show appropriate coverage of the prostate gland. He understands that we will call and inform him of  any unexpected findings on further review of his imaging and dosimetry.  Impression/Plan: 80 y.o. gentleman with Stage T1c adenocarcinoma of the prostate with Gleason score of 3+4, and PSA of 4.65. The patient is recovering from the effects of radiation. His urinary symptoms should gradually improve over the next 4-6 months. We talked about this today. He is encouraged by his improvement already and is otherwise pleased with his outcome. We also talked about long-term follow-up for prostate cancer following seed implant. He  understands that ongoing PSA determinations and digital rectal exams will help perform surveillance to rule out disease recurrence. He saw Jiles Crocker on 10/05/20 and is scheduled for labs on 12/04/20 and will see Dr. Sheryn Bison that following week. He understands what to expect with his PSA measures. Patient was also educated today about some of the long-term effects from radiation including a small risk for rectal bleeding and possibly erectile dysfunction. We talked about some of the general management approaches to these potential complications. However, I did encourage the patient to contact our office or return at any point if he has questions or concerns related to his previous radiation and prostate cancer.    Nicholos Johns, PA-C

## 2020-10-18 NOTE — Telephone Encounter (Signed)
CALLED PATIENT TO REMIND OF POST SEED APPTS. FOR 10-19-20, SPOKE WITH PATIENT AND HE IS AWARE OF THESE APPTS.

## 2020-10-18 NOTE — Progress Notes (Signed)
  Radiation Oncology         [phone] ________________________________  Name: Kevin Stewart MRN: [id number]  Date: 10/19/2020  DOB: [birth year]  COMPLEX SIMULATION NOTE  NARRATIVE:  The patient was brought to the [place] suite today following prostate seed implantation approximately one month ago.  Identity was confirmed.  All relevant records and images related to the planned course of therapy were reviewed.  Then, the patient was set-up supine.  CT images were obtained.  The CT images were loaded into the planning software.  Then the prostate and rectum were contoured.  Treatment planning then occurred.  The implanted iodine 125 seeds were identified by the physics staff for projection of radiation distribution  I have requested : 3D Simulation  I have requested a DVH of the following structures: Prostate and rectum.    ________________________________  Sheral Apley Tammi Klippel, M.D.

## 2020-10-18 NOTE — Progress Notes (Addendum)
Patient is here today for a follow-up post seed appointment. Patient reports: Dysuria none Hematuria sometime  Nocturia x3-4  Leakage  none Urgency Sometime Emptying bladder sometime Stream  States that his stream varies Push or strain to start stream no Bowels none Next appointment with urologist  April 8,2022 IPPS 23 Vitals:   10/19/20 1312  BP: 134/69  Pulse: 69  Resp: 20  Temp: 97.8 F (36.6 C)  SpO2: 98%  Weight: 78.7 kg  Height: 5\' 11"  (1.803 m)

## 2020-10-19 ENCOUNTER — Other Ambulatory Visit: Payer: Self-pay

## 2020-10-19 ENCOUNTER — Ambulatory Visit
Admission: RE | Admit: 2020-10-19 | Discharge: 2020-10-19 | Disposition: A | Discharge status: Home / Self Care | Payer: Medicare Other | Source: Ambulatory Visit | Attending: Urology | Admitting: Urology

## 2020-10-19 ENCOUNTER — Ambulatory Visit
Admission: RE | Admit: 2020-10-19 | Discharge: 2020-10-19 | Disposition: A | Discharge status: Home / Self Care | Payer: Medicare Other | Source: Ambulatory Visit | Attending: Radiation Oncology | Admitting: Radiation Oncology

## 2020-10-19 ENCOUNTER — Encounter: Payer: Self-pay | Admitting: Urology

## 2020-10-19 VITALS — BP 134/69 | HR 69 | Temp 97.8°F | Resp 20 | Ht 71.0 in | Wt 173.4 lb

## 2020-10-19 DIAGNOSIS — Z7982 Long term (current) use of aspirin: Secondary | ICD-10-CM | POA: Diagnosis not present

## 2020-10-19 DIAGNOSIS — R339 Retention of urine, unspecified: Secondary | ICD-10-CM | POA: Insufficient documentation

## 2020-10-19 DIAGNOSIS — Z79899 Other long term (current) drug therapy: Secondary | ICD-10-CM | POA: Diagnosis not present

## 2020-10-19 DIAGNOSIS — Z923 Personal history of irradiation: Secondary | ICD-10-CM | POA: Insufficient documentation

## 2020-10-19 DIAGNOSIS — C61 Malignant neoplasm of prostate: Secondary | ICD-10-CM | POA: Insufficient documentation

## 2020-10-25 DIAGNOSIS — L570 Actinic keratosis: Secondary | ICD-10-CM | POA: Diagnosis not present

## 2020-10-25 DIAGNOSIS — Z85828 Personal history of other malignant neoplasm of skin: Secondary | ICD-10-CM | POA: Diagnosis not present

## 2020-10-25 DIAGNOSIS — D485 Neoplasm of uncertain behavior of skin: Secondary | ICD-10-CM | POA: Diagnosis not present

## 2020-10-25 DIAGNOSIS — L905 Scar conditions and fibrosis of skin: Secondary | ICD-10-CM | POA: Diagnosis not present

## 2020-10-25 DIAGNOSIS — C44319 Basal cell carcinoma of skin of other parts of face: Secondary | ICD-10-CM | POA: Diagnosis not present

## 2020-10-25 DIAGNOSIS — C44311 Basal cell carcinoma of skin of nose: Secondary | ICD-10-CM | POA: Diagnosis not present

## 2020-10-26 ENCOUNTER — Ambulatory Visit
Admission: RE | Admit: 2020-10-26 | Discharge: 2020-10-26 | Disposition: A | Discharge status: Home / Self Care | Payer: Medicare Other | Source: Ambulatory Visit | Attending: Radiation Oncology | Admitting: Radiation Oncology

## 2020-10-26 ENCOUNTER — Encounter: Payer: Self-pay | Admitting: Radiation Oncology

## 2020-10-26 ENCOUNTER — Telehealth: Payer: Self-pay | Admitting: *Deleted

## 2020-10-26 DIAGNOSIS — C61 Malignant neoplasm of prostate: Secondary | ICD-10-CM | POA: Diagnosis present

## 2020-10-26 NOTE — Telephone Encounter (Signed)
Returned patient's phone call, spoke with patient 

## 2020-11-02 ENCOUNTER — Telehealth: Payer: Self-pay | Admitting: Cardiology

## 2020-11-02 NOTE — Telephone Encounter (Signed)
Returned call to [facility] to discuss-calling to make aware that they do not require patients to hold ASA or Plavix prior to most procedures in their office.   She states patient has multiple upcoming procedures.  1st is scheduled 4/21 (Mohs procedure), will be working on the bridge of his nose and in front of right ear.  2nd is 5/11.  They are removing skin cancer/abnormalities.    She states this is done in the office under local anesthesia and they do cauterize + suture after.   Although they do no require him to hold his medication, patient is requesting to hold this and wanted permission to do so.      Routed to MD to review if patient ok to hold per patient preference/request.

## 2020-11-02 NOTE — Telephone Encounter (Signed)
Dr office was calling be patient want them to call to say that the patient need to stop the aspirin 81 MG tablet clopidogrel (PLAVIX) 75 MG tablet but dr office states that they dont recommend that he stops taking them. The patient feels that he lets him bleed more then he should. Please advise

## 2020-11-02 NOTE — Telephone Encounter (Signed)
He could hold Plavix for the procedure but should remain on ASA  Mita Vallo [name] MD, [facility]

## 2020-11-09 DIAGNOSIS — D485 Neoplasm of uncertain behavior of skin: Secondary | ICD-10-CM | POA: Diagnosis not present

## 2020-11-09 DIAGNOSIS — C44311 Basal cell carcinoma of skin of nose: Secondary | ICD-10-CM | POA: Diagnosis not present

## 2020-11-09 DIAGNOSIS — D0439 Carcinoma in situ of skin of other parts of face: Secondary | ICD-10-CM | POA: Diagnosis not present

## 2020-11-09 NOTE — Telephone Encounter (Signed)
Attempt to call [facility] to make aware-unable to reach.  Attempt to call patient to make aware-left detailed message with recommendations (ok per DPR)

## 2020-11-10 NOTE — Telephone Encounter (Signed)
Spoke to patient Dr.Jordan's advice given. Returned call to [place] with Dr.Mario's office left message to call back.

## 2020-11-16 DIAGNOSIS — Z9181 History of falling: Secondary | ICD-10-CM | POA: Diagnosis not present

## 2020-11-16 DIAGNOSIS — E785 Hyperlipidemia, unspecified: Secondary | ICD-10-CM | POA: Diagnosis not present

## 2020-11-16 DIAGNOSIS — Z0000 Encounter for general adult medical examination without abnormal findings: Secondary | ICD-10-CM | POA: Diagnosis not present

## 2020-11-24 DIAGNOSIS — C44229 Squamous cell carcinoma of skin of left ear and external auricular canal: Secondary | ICD-10-CM | POA: Diagnosis not present

## 2020-11-24 DIAGNOSIS — D485 Neoplasm of uncertain behavior of skin: Secondary | ICD-10-CM | POA: Diagnosis not present

## 2020-11-24 DIAGNOSIS — C44311 Basal cell carcinoma of skin of nose: Secondary | ICD-10-CM | POA: Diagnosis not present

## 2020-11-27 NOTE — Progress Notes (Signed)
  Radiation Oncology         [phone] ________________________________  Name: Kevin Stewart MRN: [id number]  Date: 10/26/2020  DOB: 09/09/1940  3D Planning Note   Prostate Brachytherapy Post-Implant Dosimetry  Diagnosis: 80 y.o. gentleman with Stage T1c adenocarcinoma of the prostate with Gleason score of 3+4, and PSA of 4.65.  Narrative: On a previous date, Kevin Stewart returned following prostate seed implantation for post implant planning. He underwent CT scan complex simulation to delineate the three-dimensional structures of the pelvis and demonstrate the radiation distribution.  Since that time, the seed localization, and complex isodose planning with dose volume histograms have now been completed.  Results:   Prostate Coverage - The dose of radiation delivered to the 90% or more of the prostate gland (D90) was 100.66% of the prescription dose. This exceeds our goal of greater than 90%. Rectal Sparing - The volume of rectal tissue receiving the prescription dose or higher was 0.0 cc. This falls under our thresholds tolerance of 1.0 cc.  Impression: The prostate seed implant appears to show adequate target coverage and appropriate rectal sparing.  Plan:  The patient will continue to follow with urology for ongoing PSA determinations. I would anticipate a high likelihood for local tumor control with minimal risk for rectal morbidity.  ________________________________  Sheral Apley Tammi Klippel, M.D.

## 2020-11-29 DIAGNOSIS — C4441 Basal cell carcinoma of skin of scalp and neck: Secondary | ICD-10-CM | POA: Diagnosis not present

## 2020-11-29 DIAGNOSIS — D485 Neoplasm of uncertain behavior of skin: Secondary | ICD-10-CM | POA: Diagnosis not present

## 2020-12-25 DIAGNOSIS — R3915 Urgency of urination: Secondary | ICD-10-CM | POA: Diagnosis not present

## 2020-12-26 DIAGNOSIS — C4441 Basal cell carcinoma of skin of scalp and neck: Secondary | ICD-10-CM | POA: Diagnosis not present

## 2021-01-03 DIAGNOSIS — L03113 Cellulitis of right upper limb: Secondary | ICD-10-CM | POA: Diagnosis not present

## 2021-01-10 DIAGNOSIS — C44229 Squamous cell carcinoma of skin of left ear and external auricular canal: Secondary | ICD-10-CM | POA: Diagnosis not present

## 2021-01-21 DIAGNOSIS — J069 Acute upper respiratory infection, unspecified: Secondary | ICD-10-CM | POA: Diagnosis not present

## 2021-03-01 DIAGNOSIS — I10 Essential (primary) hypertension: Secondary | ICD-10-CM | POA: Diagnosis not present

## 2021-03-01 DIAGNOSIS — R3911 Hesitancy of micturition: Secondary | ICD-10-CM | POA: Diagnosis not present

## 2021-03-01 DIAGNOSIS — I252 Old myocardial infarction: Secondary | ICD-10-CM | POA: Diagnosis not present

## 2021-03-01 DIAGNOSIS — E785 Hyperlipidemia, unspecified: Secondary | ICD-10-CM | POA: Diagnosis not present

## 2021-03-01 DIAGNOSIS — I2510 Atherosclerotic heart disease of native coronary artery without angina pectoris: Secondary | ICD-10-CM | POA: Diagnosis not present

## 2021-03-01 DIAGNOSIS — Z923 Personal history of irradiation: Secondary | ICD-10-CM | POA: Diagnosis not present

## 2021-03-01 DIAGNOSIS — J309 Allergic rhinitis, unspecified: Secondary | ICD-10-CM | POA: Diagnosis not present

## 2021-03-01 DIAGNOSIS — I25118 Atherosclerotic heart disease of native coronary artery with other forms of angina pectoris: Secondary | ICD-10-CM | POA: Diagnosis not present

## 2021-03-01 DIAGNOSIS — Z6824 Body mass index (BMI) 24.0-24.9, adult: Secondary | ICD-10-CM | POA: Diagnosis not present

## 2021-03-02 DIAGNOSIS — C44311 Basal cell carcinoma of skin of nose: Secondary | ICD-10-CM | POA: Diagnosis not present

## 2021-03-19 DIAGNOSIS — H02055 Trichiasis without entropian left lower eyelid: Secondary | ICD-10-CM | POA: Diagnosis not present

## 2021-03-22 IMAGING — MR MR SHOULDER*R* W/O CM
4 of 5 series · 28 of 40 positions shown · non-contrast
Comparison: X-ray 07/27/2019

CLINICAL DATA: Right shoulder pain and decreased range of motion
for 1 year

EXAM:
MRI OF THE RIGHT SHOULDER WITHOUT CONTRAST
TECHNIQUE: Multiplanar, multisequence MR imaging of the shoulder was performed.
No intravenous contrast was administered.

[Series 4: PD fat-sat · axial · 4.0mm · 0.25mm/px · z∈[-3,+76]mm · 8 of 19 slices shown (1 of 2)]
[im 1/19]
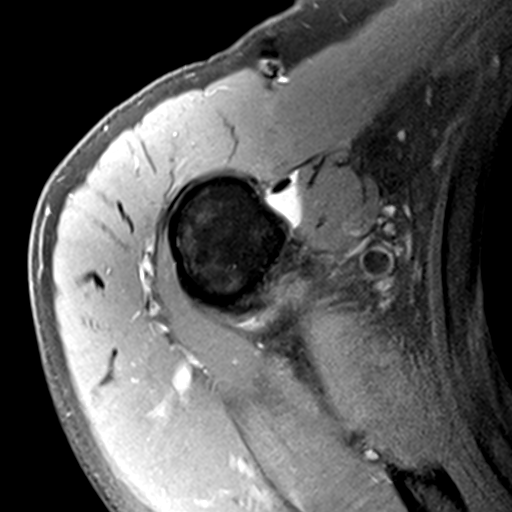
[im 3/19]
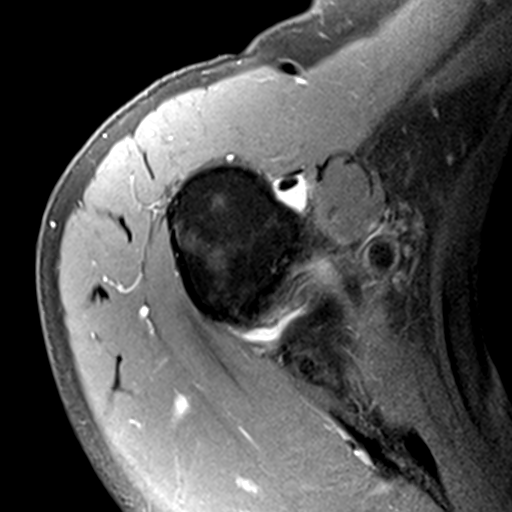
[im 7/19]
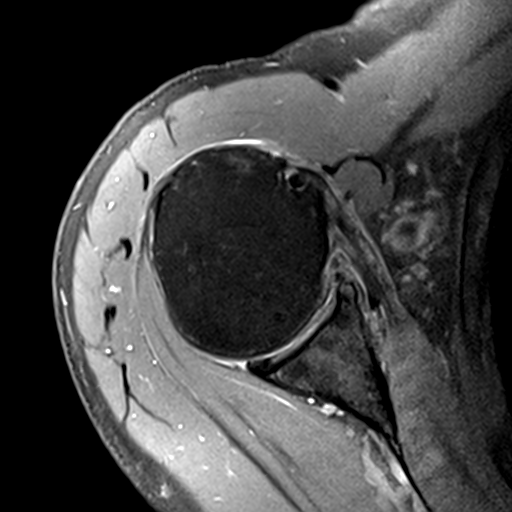
[im 9/19]
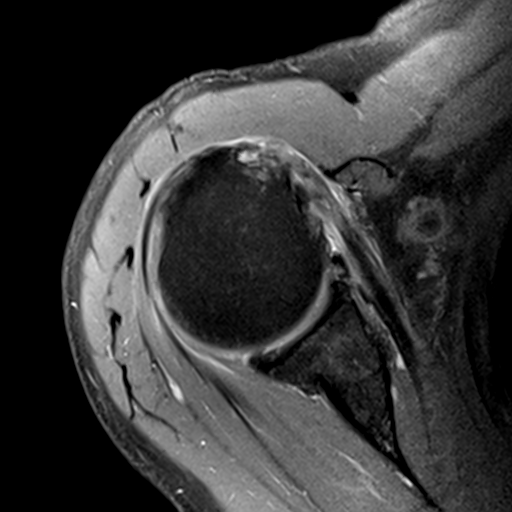
[im 11/19]
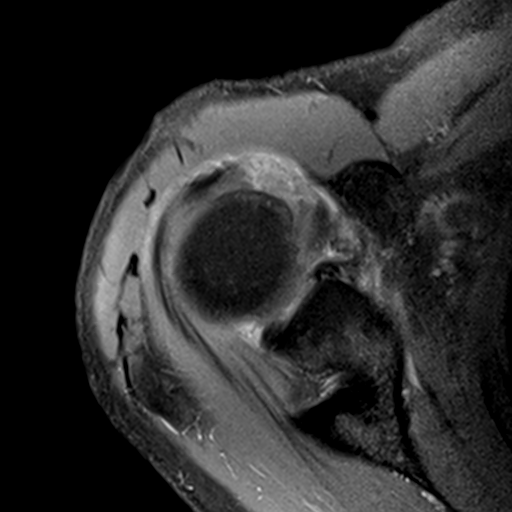
[im 13/19]
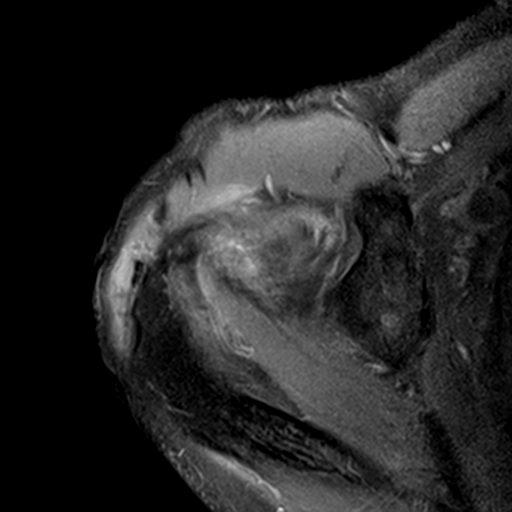
[im 17/19]
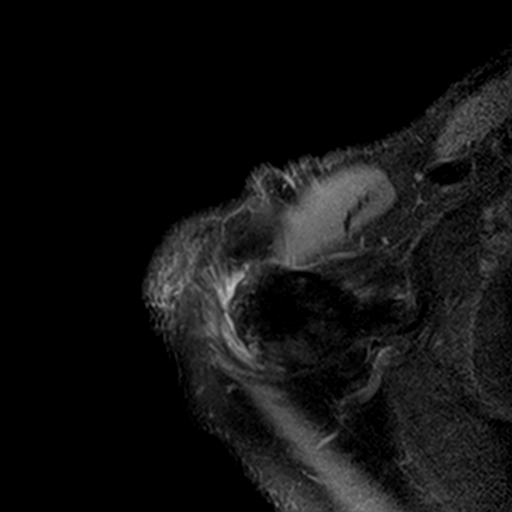
[im 19/19]
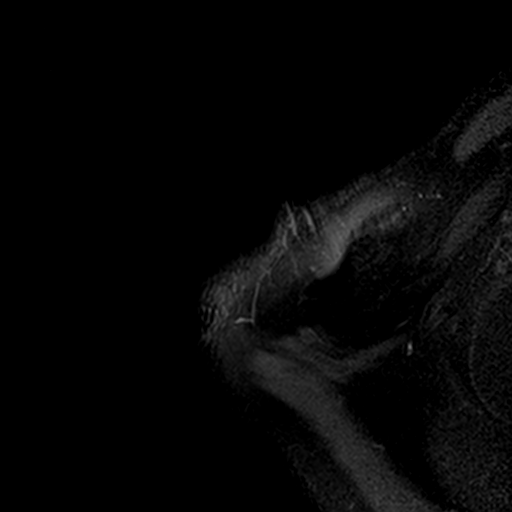

[Series 5: T2 fat-sat · oblique · 4.0mm · 0.55mm/px · 7 of 15 slices shown (1 of 2)]
[im 1/15]
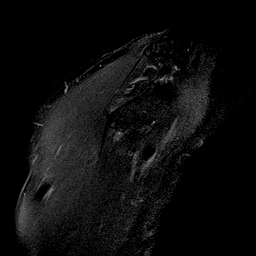
[im 3/15]
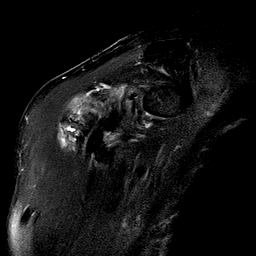
[im 5/15]
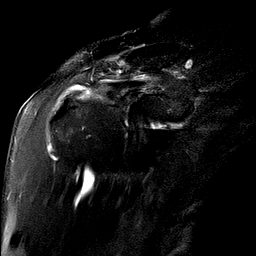
[im 8/15]
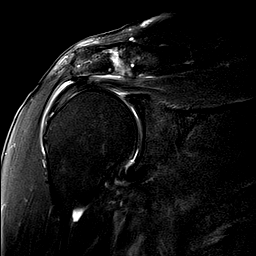
[im 10/15]
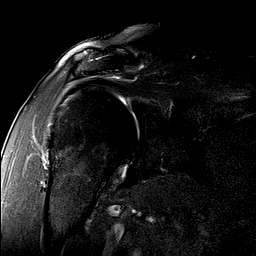
[im 12/15]
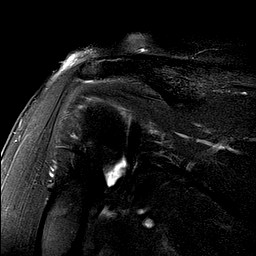
[im 15/15]
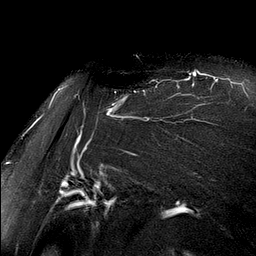

[Series 6: PD fat-sat · oblique · 4.0mm · 0.27mm/px · 7 of 15 slices shown (2 of 2)]
[im 1/15]
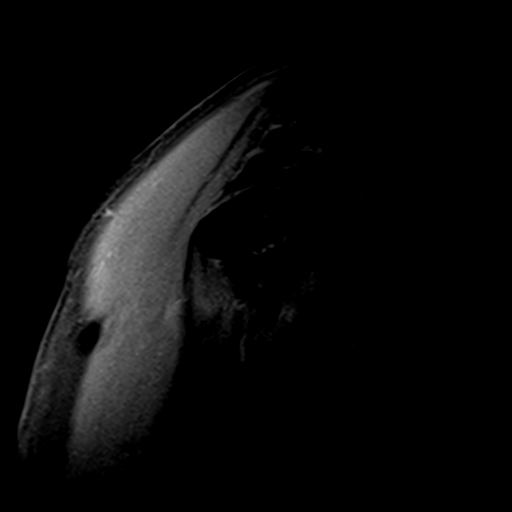
[im 3/15]
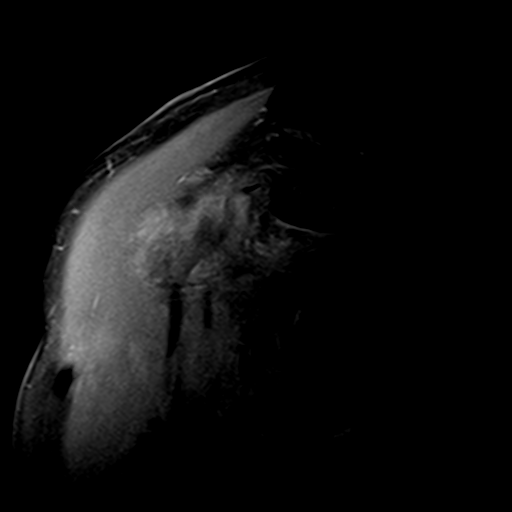
[im 5/15]
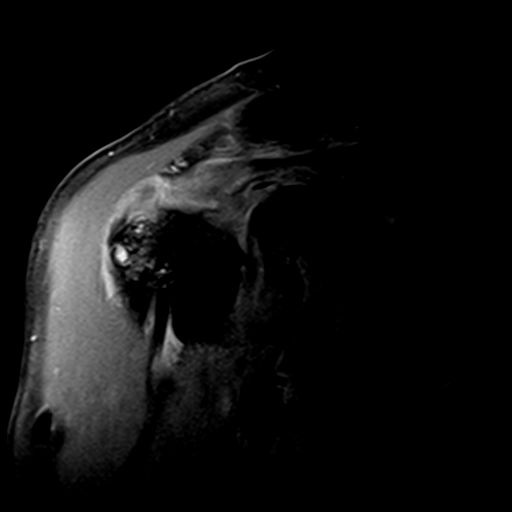
[im 8/15]
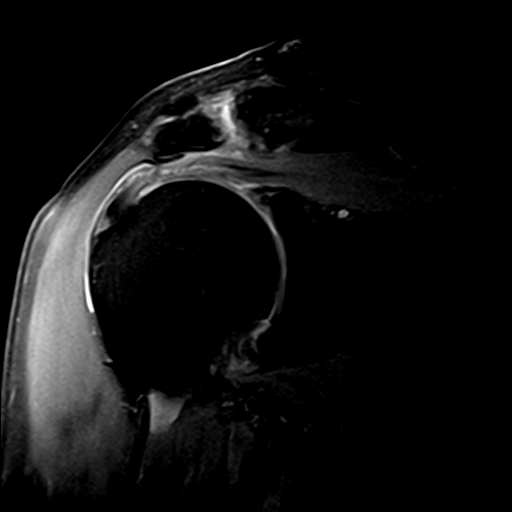
[im 10/15]
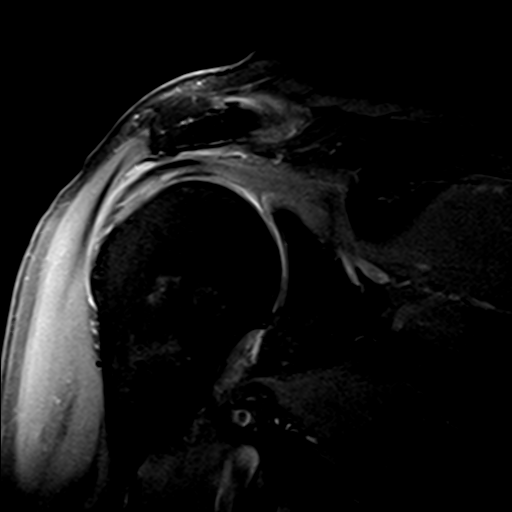
[im 12/15]
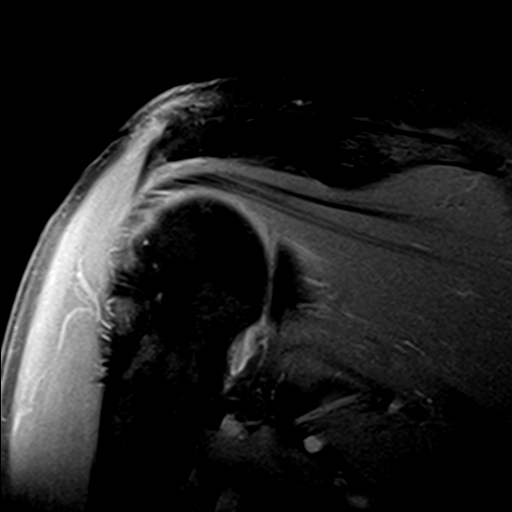
[im 15/15]
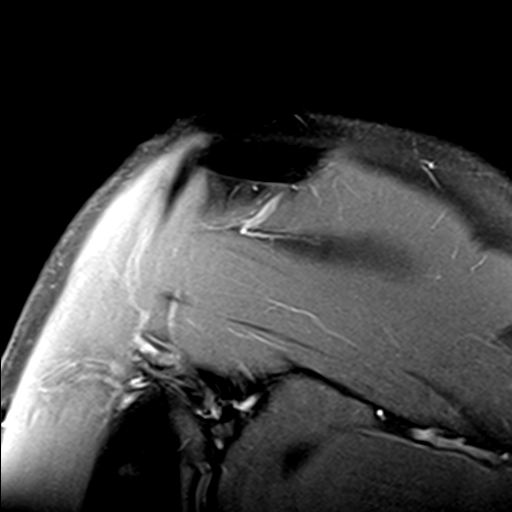

[Series 7: T2 fat-sat · oblique · 4.0mm · 0.55mm/px · 6 of 17 slices shown (2 of 2)]
[im 1/17]
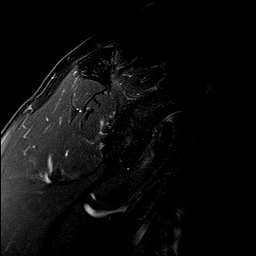
[im 3/17]
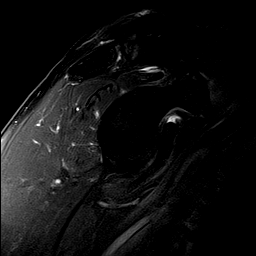
[im 5/17]
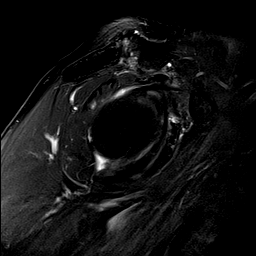
[im 7/17]
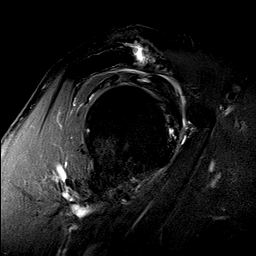
[im 10/17]
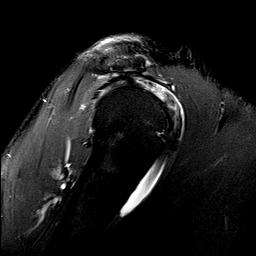
[im 14/17]
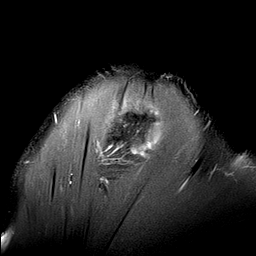

[28 of 40 positions shown; findings below may reference images not displayed]

FINDINGS: Rotator cuff: Supraspinatus tendinosis with full-thickness non
retracted tear of the anterior tendon fibers in the region of the
critical zone (series 5, images 10-12; series 7, image 10).
Infraspinatus tendon is intact. Subscapularis tendinosis with
partial-thickness fraying of the more superior aspect of the distal
tendon. Teres minor intact.

Muscles: No atrophy or abnormal signal of the muscles of the rotator
cuff.

Biceps long head: Intra-articular biceps tendon is ill-defined and
intermediate in signal likely reflecting a combination of tendinosis
with partial tearing. Mild volume of fluid within the
extra-articular tendon sheath.

Acromioclavicular Joint: Mild arthropathy of the acromioclavicular
joint. Small volume subacromial-subdeltoid bursal fluid.

Glenohumeral Joint: Mild chondral thinning and surface irregularity
without a discrete full-thickness chondral defect. Trace effusion.

Labrum: Degeneration with probable tearing of the anteroinferior
labrum.

Bones:  No marrow abnormality, fracture or dislocation.

Other: None.
IMPRESSION: 1. Supraspinatus tendinosis with full-thickness non-retracted tear
of the anterior tendon fibers in the region of the critical zone.
2. Subscapularis tendinosis with partial-thickness tearing.
3. Intra-articular biceps tendinosis with partial tearing.
4. Glenohumeral and acromioclavicular osteoarthritis.

## 2021-03-27 DIAGNOSIS — Z6824 Body mass index (BMI) 24.0-24.9, adult: Secondary | ICD-10-CM | POA: Diagnosis not present

## 2021-03-27 DIAGNOSIS — M79641 Pain in right hand: Secondary | ICD-10-CM | POA: Diagnosis not present

## 2021-03-30 DIAGNOSIS — Z85828 Personal history of other malignant neoplasm of skin: Secondary | ICD-10-CM | POA: Diagnosis not present

## 2021-03-30 DIAGNOSIS — Z08 Encounter for follow-up examination after completed treatment for malignant neoplasm: Secondary | ICD-10-CM | POA: Diagnosis not present

## 2021-03-30 DIAGNOSIS — L570 Actinic keratosis: Secondary | ICD-10-CM | POA: Diagnosis not present

## 2021-03-30 DIAGNOSIS — L905 Scar conditions and fibrosis of skin: Secondary | ICD-10-CM | POA: Diagnosis not present

## 2021-03-30 DIAGNOSIS — C4492 Squamous cell carcinoma of skin, unspecified: Secondary | ICD-10-CM | POA: Diagnosis not present

## 2021-03-30 DIAGNOSIS — C4491 Basal cell carcinoma of skin, unspecified: Secondary | ICD-10-CM | POA: Diagnosis not present

## 2021-06-10 NOTE — Progress Notes (Signed)
Cardiology Office Note   Date:  06/12/2021   ID:  Kevin Stewart, DOB January 02, 1941, MRN [id number]  PCP:  Kevin Pali, NP  Cardiologist:   Beryle Bagsby [name], MD   Chief Complaint  Patient presents with   Coronary Artery Disease       History of Present Illness: Kevin Stewart is a 80 y.o. male who is seen for follow up CAD.  He presented in 2003 with a "light" heart attack and had stenting of the proximal LAD with a 3.5 x 18 mm Cypher stent in [facility]. He has been on chronic ASA and Plavix.  He has risk factors of HTN, hypercholesterolemia, and family history of CAD. In the spring of 2017 he presented with exertional angina.  He had an ETT that was abnormal and underwent cardiac cath on 12/20/15 demonstrating a 99% proximal LAD stenosis proximal to the prior stent. This was treated with DES. He had a follow up  ETT in June 2018 which showed a marked improvement. Zetia was added for hypercholesterolemia. He did not tolerate this well and wanted to switch to Vytorin which he has tolerated in the past. When he took the 10/40 mg he continued to have symptoms and stopped it altogether. He was then on Crestor 5 mg daily.   Reports he had Covid last Dec. In March 2022 he underwent radioactive seed implant for prostate CA. Also had Moh's skin surgery in June.   On follow up today he is doing well. He has rare chest discomfort. Some dyspnea when he first starts walking uphill but this resolves. Notes some fatigue.  He walks a lot on his [place] property.   No palpitations.   Past Medical History:  Diagnosis Date   Arthritis    "hands" (12/20/2015)   Basal cell carcinoma, face    Coronary artery disease    a. 2003 - light MI per patient; s/p Cypher DES to prox LAD in [facility].   COVID 06/28/2020   cough, headache fever x 2 weeks all symptoms resolved, took monoclonal antibodies infusion   History of kidney stones 1975, 2021   passed them 1975,  2021 sx done   History of migraine     none since mi in 2000   Hx of adenomatous colonic polyps    Hyperlipemia    Hyperlipidemia    Hypertension    Melanoma of right side of neck ([place])    removed posterior neck [id number]/312013   Myocardial infarction ([place]) 2000   Pre-diabetes    checks cbg q month   Prostate cancer ([place])    Wears dentures    upper-lower partial   Wears glasses     Past Surgical History:  Procedure Laterality Date   CARDIAC CATHETERIZATION N/A 12/20/2015   Procedure: Left Heart Cath and Coronary Angiography;  Surgeon: Belva Crome, MD;  Location: [place] [facility];  Service: Cardiovascular;  Laterality: N/A;   CARDIAC CATHETERIZATION N/A 12/20/2015   Procedure: Coronary Stent Intervention;  Surgeon: Belva Crome, MD;  Location: [place] [facility];  Service: Cardiovascular;  Laterality: N/A;   CATARACT EXTRACTION W/ INTRAOCULAR LENS  IMPLANT, BILATERAL Bilateral 2011   CIRCUMCISION  age 48   COLONOSCOPY  2012   CORONARY ANGIOPLASTY WITH STENT PLACEMENT  2000   "1 stent"   CORONARY ANGIOPLASTY WITH STENT PLACEMENT  12/20/2015   "1 stent"   CYSTOSCOPY  09/19/2020   Procedure: CYSTOSCOPY FLEXIBLE;  Surgeon: Janith Lima,  MD;  Location: [place];  Service: Urology;;  no seeds found in bladder   CYSTOSCOPY/URETEROSCOPY/HOLMIUM LASER/STENT PLACEMENT Right 08/03/2019   Procedure: CYSTOSCOPY/RETROGRADE/URETEROSCOPY//STENT PLACEMENT;  Surgeon: Ceasar Mons, MD;  Location: [facility];  Service: Urology;  Laterality: Right;   HEMORRHOID SURGERY     "burned them off"   MELANOMA EXCISION WITH SENTINEL LYMPH NODE BIOPSY  07/21/2012   Procedure: MELANOMA EXCISION WITH SENTINEL LYMPH NODE BIOPSY;  Surgeon: Rozetta Nunnery, MD;  Location: [place];  Service: General;  Laterality: N/A;  melanoma excision with sentinel node biopsy   posterior right neck   PROSTATE BIOPSY  06/2020   RADIOACTIVE SEED IMPLANT N/A 09/19/2020   Procedure: RADIOACTIVE SEED  IMPLANT/BRACHYTHERAPY IMPLANT;  Surgeon: Janith Lima, MD;  Location: [facility];  Service: Urology;  Laterality: N/A;   66  seeds implanted   rotator cuff [facility]  [facility]   SPACE OAR INSTILLATION N/A 09/19/2020   Procedure: SPACE OAR INSTILLATION;  Surgeon: Janith Lima, MD;  Location: [facility];  Service: Urology;  Laterality: N/A;   TONSILLECTOMY  1961     Current Outpatient Medications  Medication Sig Dispense Refill   aspirin 81 MG tablet Take 81 mg by mouth daily.     carvedilol (COREG) 12.5 MG tablet Take 12.5 mg by mouth at bedtime.     clopidogrel (PLAVIX) 75 MG tablet Take 75 mg by mouth at bedtime.      co-enzyme Q-10 50 MG capsule Take 50 mg by mouth daily.     docusate sodium (COLACE) 100 MG capsule Take 1 capsule (100 mg total) by mouth daily as needed. 30 capsule 0   Enalapril-Hydrochlorothiazide 5-12.5 MG tablet Take 1 tablet by mouth daily.     finasteride (PROSCAR) 5 MG tablet Take 1 tablet (5 mg total) by mouth daily. 30 tablet 2   fluticasone (CUTIVATE) 0.05 % cream      nitroGLYCERIN (NITROSTAT) 0.4 MG SL tablet Place 1 tablet (0.4 mg total) under the tongue every 5 (five) minutes as needed for chest pain (up to 3 doses). 25 tablet 3   rosuvastatin (CRESTOR) 10 MG tablet Take 10 mg by mouth daily.     tamsulosin (FLOMAX) 0.4 MG CAPS capsule Take 0.4 mg by mouth daily.     No current facility-administered medications for this visit.    Allergies:   Shrimp [shellfish allergy]    Social History:  The patient  reports that he quit smoking about 47 years ago. His smoking use included cigarettes. He has a 20.00 pack-year smoking history. He has quit using smokeless tobacco.  His smokeless tobacco use included chew. He reports that he does not currently use alcohol. He reports that he does not use drugs.   Family History:  The patient's family history includes Cancer - Colon (age of onset: 64) in his brother;  Cancer - Other (age of onset: 28) in his father; Cancer - Other (age of onset: 44) in his sister; Heart attack (age of onset: 75) in his mother; Heart attack (age of onset: 55) in his brother; Pancreatic cancer in his father and sister.    ROS:  Please see the history of present illness.     All other systems are reviewed and negative.    PHYSICAL EXAM: VS:  BP 130/60   Pulse 76   Ht '5\' 11"'  (1.803 m)   Wt 176 lb 6.4 oz (80  kg)   SpO2 97%   BMI 24.60 kg/m  , BMI Body mass index is 24.6 kg/m.   EKG:  EKG is not ordered today. Ecg 08/03/19 showed NSR with old anterior infarct. I have personally reviewed and interpreted this study.     Lab Results  Component Value Date   WBC 6.0 09/15/2020   HGB 14.6 09/15/2020   HCT 42.7 09/15/2020   PLT 213 09/15/2020   GLUCOSE 109 (H) 09/15/2020   CHOL 196 04/15/2019   TRIG 61 04/15/2019   HDL 69 04/15/2019   LDLCALC 116 (H) 04/15/2019   ALT 19 09/15/2020   AST 20 09/15/2020   NA 137 09/15/2020   K 4.1 09/15/2020   CL 102 09/15/2020   CREATININE 0.83 09/15/2020   BUN 15 09/15/2020   CO2 28 09/15/2020   INR 1.1 09/15/2020    Labs dated 06/06/16: cholesterol 181, triglycerides 48, HDL 62, LDL 109. A1c 5.5%. CMET normal. Dated 06/26/17: cholesterol 250, triglycerides 60, HDL 59, LDL 179. CMET normal.  Dated 12/24/17: cholesterol 172, triglycerides 55, HDL 60, LDL 101. Glucose 111. Otherwise CMET normal.  Dated 07/29/19: cholesterol 196, triglycerides 88, HDL 60, LDL 120. Glucose 124 otherwise CBC and CMET normal.  Dated 03/01/21: cholesterol 191, triglycerides 81, HDL 57, LDL 119. Otherwise CMET and CBC normal  Wt Readings from Last 3 Encounters:  06/12/21 176 lb 6.4 oz (80 kg)  10/19/20 173 lb 6.4 oz (78.7 kg)  09/19/20 167 lb 12.8 oz (76.1 kg)      Other studies Reviewed: Additional studies/ records that were reviewed today include: Cardiac cath 12/20/15: Review of the above records demonstrates:  Conclusion   Prox RCA to Dist RCA  lesion, 30% stenosed. Mid LAD lesion, 20% stenosed. The lesion was previously treated with a stent (unknown type). Prox LAD to Mid LAD lesion, 90% stenosed. Post intervention, there is a 10% residual stenosis.   90% stenosis proximal to the previously placed LAD stent within the heavily calcified segment. Dominant right coronary which wraps around the left ventricular apex. Minimal luminal irregularities are noted. Patent circumflex and left main. Successful percutaneous intervention with [place] angioplasty of the proximal stenosis followed by drug-eluting stent implantation reducing the 90% stenosis to 10% with TIMI grade 3 flow. Postdilatation was to 3.75 mm at 16 atm. Normal left ventricular systolic function with normal end-diastolic pressure.   Recommendations:   Continue Plavix and aspirin Discharge tomorrow if no complications overnight.     ETT 12/25/16: Study Highlights     Blood pressure demonstrated a hypertensive response to exercise. Horizontal ST segment depression ST segment depression of 1 mm was noted during stress in the II, III, aVF, V5 and V6 leads, and returning to baseline after less than 1 minute of recovery.   1. Excellent exercise tolerance.  2. Hypertensive BP response.  3. Equivocal changes on exercise ECG: about 1 mm horizontal ST depression in inferior leads and V5,V6 though difficult to tell for sure due to baseline artifact.  ST segments returned to baseline quickly in recovery.    Overall low risk study with excellent exercise tolerance but not normal.      ASSESSMENT AND PLAN:  1.  Coronary artery disease- native vessel with stable angina class 1 symptoms. Remote stenting of the LAD in 2003 with a Cypher stent. S/p stenting of the proximal LAD in May 2017 with DES.  Now on DAPT with ASA and Plavix. On beta blocker and ACEi. Plan to continue Plavix indefinitely with  first generation DES. Follow up ETT in June 2018 was low risk and much improved  from prior.   2. Hypercholesterolemia. Intolerant of Vytorin and lipitor. Able to tolerate Crestor at 5 mg daily but does have some aches. Unable to afford Repatha. Last LDL 119.     3. HTN controlled   4. Prostate CA s/p radioactive seed implant  Current medicines are reviewed at length with the patient today.  The patient does not have concerns regarding medicines.  The following changes have been made:  no change  Labs/ tests ordered today include:   No orders of the defined types were placed in this encounter.    Disposition:   Follow up in one year  Signed, Myrene Bougher [name], MD  06/12/2021 10:39 AM    [facility] [address] [place], [place], [address] Phone [phone], Fax [phone]

## 2021-06-12 ENCOUNTER — Encounter: Payer: Self-pay | Admitting: Cardiology

## 2021-06-12 ENCOUNTER — Other Ambulatory Visit: Payer: Self-pay

## 2021-06-12 ENCOUNTER — Ambulatory Visit: Payer: Medicare Other | Admitting: Cardiology

## 2021-06-12 VITALS — BP 130/60 | HR 76 | Ht 71.0 in | Wt 176.4 lb

## 2021-06-12 DIAGNOSIS — I10 Essential (primary) hypertension: Secondary | ICD-10-CM | POA: Diagnosis not present

## 2021-06-12 DIAGNOSIS — I25118 Atherosclerotic heart disease of native coronary artery with other forms of angina pectoris: Secondary | ICD-10-CM

## 2021-06-12 DIAGNOSIS — E7800 Pure hypercholesterolemia, unspecified: Secondary | ICD-10-CM

## 2021-08-08 DIAGNOSIS — M3501 Sicca syndrome with keratoconjunctivitis: Secondary | ICD-10-CM | POA: Diagnosis not present

## 2021-08-08 DIAGNOSIS — H02055 Trichiasis without entropian left lower eyelid: Secondary | ICD-10-CM | POA: Diagnosis not present

## 2021-09-12 DIAGNOSIS — I10 Essential (primary) hypertension: Secondary | ICD-10-CM | POA: Diagnosis not present

## 2021-09-12 DIAGNOSIS — Z79899 Other long term (current) drug therapy: Secondary | ICD-10-CM | POA: Diagnosis not present

## 2021-09-12 DIAGNOSIS — I25118 Atherosclerotic heart disease of native coronary artery with other forms of angina pectoris: Secondary | ICD-10-CM | POA: Diagnosis not present

## 2021-09-12 DIAGNOSIS — R251 Tremor, unspecified: Secondary | ICD-10-CM | POA: Diagnosis not present

## 2021-09-12 DIAGNOSIS — Z6824 Body mass index (BMI) 24.0-24.9, adult: Secondary | ICD-10-CM | POA: Diagnosis not present

## 2021-09-12 DIAGNOSIS — E785 Hyperlipidemia, unspecified: Secondary | ICD-10-CM | POA: Diagnosis not present

## 2021-09-13 DIAGNOSIS — Z08 Encounter for follow-up examination after completed treatment for malignant neoplasm: Secondary | ICD-10-CM | POA: Diagnosis not present

## 2021-09-13 DIAGNOSIS — L814 Other melanin hyperpigmentation: Secondary | ICD-10-CM | POA: Diagnosis not present

## 2021-09-13 DIAGNOSIS — L821 Other seborrheic keratosis: Secondary | ICD-10-CM | POA: Diagnosis not present

## 2021-09-13 DIAGNOSIS — L570 Actinic keratosis: Secondary | ICD-10-CM | POA: Diagnosis not present

## 2021-09-13 DIAGNOSIS — D3612 Benign neoplasm of peripheral nerves and autonomic nervous system, upper limb, including shoulder: Secondary | ICD-10-CM | POA: Diagnosis not present

## 2021-09-13 DIAGNOSIS — C44519 Basal cell carcinoma of skin of other part of trunk: Secondary | ICD-10-CM | POA: Diagnosis not present

## 2021-09-13 DIAGNOSIS — D485 Neoplasm of uncertain behavior of skin: Secondary | ICD-10-CM | POA: Diagnosis not present

## 2021-09-13 DIAGNOSIS — X32XXXA Exposure to sunlight, initial encounter: Secondary | ICD-10-CM | POA: Diagnosis not present

## 2021-09-13 DIAGNOSIS — Z85820 Personal history of malignant melanoma of skin: Secondary | ICD-10-CM | POA: Diagnosis not present

## 2021-09-13 DIAGNOSIS — D034 Melanoma in situ of scalp and neck: Secondary | ICD-10-CM | POA: Diagnosis not present

## 2021-09-14 DIAGNOSIS — R238 Other skin changes: Secondary | ICD-10-CM | POA: Diagnosis not present

## 2021-11-05 DIAGNOSIS — C44519 Basal cell carcinoma of skin of other part of trunk: Secondary | ICD-10-CM | POA: Diagnosis not present

## 2021-11-13 DIAGNOSIS — D034 Melanoma in situ of scalp and neck: Secondary | ICD-10-CM | POA: Diagnosis not present

## 2021-11-13 DIAGNOSIS — D0339 Melanoma in situ of other parts of face: Secondary | ICD-10-CM | POA: Diagnosis not present

## 2021-11-13 DIAGNOSIS — L905 Scar conditions and fibrosis of skin: Secondary | ICD-10-CM | POA: Diagnosis not present

## 2021-11-22 DIAGNOSIS — Z0000 Encounter for general adult medical examination without abnormal findings: Secondary | ICD-10-CM | POA: Diagnosis not present

## 2021-11-22 DIAGNOSIS — Z139 Encounter for screening, unspecified: Secondary | ICD-10-CM | POA: Diagnosis not present

## 2021-11-22 DIAGNOSIS — Z9181 History of falling: Secondary | ICD-10-CM | POA: Diagnosis not present

## 2021-11-22 DIAGNOSIS — E785 Hyperlipidemia, unspecified: Secondary | ICD-10-CM | POA: Diagnosis not present

## 2021-12-31 DIAGNOSIS — R3915 Urgency of urination: Secondary | ICD-10-CM | POA: Diagnosis not present

## 2022-01-17 DIAGNOSIS — L820 Inflamed seborrheic keratosis: Secondary | ICD-10-CM | POA: Diagnosis not present

## 2022-01-17 DIAGNOSIS — Z85820 Personal history of malignant melanoma of skin: Secondary | ICD-10-CM | POA: Diagnosis not present

## 2022-01-17 DIAGNOSIS — L814 Other melanin hyperpigmentation: Secondary | ICD-10-CM | POA: Diagnosis not present

## 2022-01-17 DIAGNOSIS — Z85828 Personal history of other malignant neoplasm of skin: Secondary | ICD-10-CM | POA: Diagnosis not present

## 2022-01-17 DIAGNOSIS — L905 Scar conditions and fibrosis of skin: Secondary | ICD-10-CM | POA: Diagnosis not present

## 2022-01-17 DIAGNOSIS — L298 Other pruritus: Secondary | ICD-10-CM | POA: Diagnosis not present

## 2022-01-17 DIAGNOSIS — D485 Neoplasm of uncertain behavior of skin: Secondary | ICD-10-CM | POA: Diagnosis not present

## 2022-01-17 DIAGNOSIS — L821 Other seborrheic keratosis: Secondary | ICD-10-CM | POA: Diagnosis not present

## 2022-01-17 DIAGNOSIS — L570 Actinic keratosis: Secondary | ICD-10-CM | POA: Diagnosis not present

## 2022-01-17 DIAGNOSIS — L538 Other specified erythematous conditions: Secondary | ICD-10-CM | POA: Diagnosis not present

## 2022-01-17 DIAGNOSIS — R208 Other disturbances of skin sensation: Secondary | ICD-10-CM | POA: Diagnosis not present

## 2022-01-17 DIAGNOSIS — Z08 Encounter for follow-up examination after completed treatment for malignant neoplasm: Secondary | ICD-10-CM | POA: Diagnosis not present

## 2022-03-13 DIAGNOSIS — R251 Tremor, unspecified: Secondary | ICD-10-CM | POA: Diagnosis not present

## 2022-03-13 DIAGNOSIS — Z79899 Other long term (current) drug therapy: Secondary | ICD-10-CM | POA: Diagnosis not present

## 2022-03-13 DIAGNOSIS — I10 Essential (primary) hypertension: Secondary | ICD-10-CM | POA: Diagnosis not present

## 2022-03-13 DIAGNOSIS — Z6824 Body mass index (BMI) 24.0-24.9, adult: Secondary | ICD-10-CM | POA: Diagnosis not present

## 2022-03-13 DIAGNOSIS — I25118 Atherosclerotic heart disease of native coronary artery with other forms of angina pectoris: Secondary | ICD-10-CM | POA: Diagnosis not present

## 2022-03-13 DIAGNOSIS — E785 Hyperlipidemia, unspecified: Secondary | ICD-10-CM | POA: Diagnosis not present

## 2022-04-25 DIAGNOSIS — D0339 Melanoma in situ of other parts of face: Secondary | ICD-10-CM | POA: Diagnosis not present

## 2022-05-20 DIAGNOSIS — U071 COVID-19: Secondary | ICD-10-CM | POA: Diagnosis not present

## 2022-05-20 DIAGNOSIS — Z20822 Contact with and (suspected) exposure to covid-19: Secondary | ICD-10-CM | POA: Diagnosis not present

## 2022-05-20 DIAGNOSIS — R059 Cough, unspecified: Secondary | ICD-10-CM | POA: Diagnosis not present

## 2022-06-06 NOTE — Progress Notes (Unsigned)
Cardiology Clinic Note   Patient Name: RODRIGUEZ AGUINALDO Date of Encounter: 06/12/2022  Primary Care Provider:  [name], MD Primary Cardiologist:  Peter [name], MD  Patient Profile    NENG ALBEE 81 year old male presents to the clinic today for follow-up evaluation of his coronary artery disease and essential hypertension.  Past Medical History    Past Medical History:  Diagnosis Date   Arthritis    "hands" (12/20/2015)   Basal cell carcinoma, face    Coronary artery disease    a. 2003 - light MI per patient; s/p Cypher DES to prox LAD in [facility].   COVID 06/28/2020   cough, headache fever x 2 weeks all symptoms resolved, took monoclonal antibodies infusion   History of kidney stones 1975, 2021   passed them 1975,  2021 sx done   History of migraine    none since mi in 2000   Hx of adenomatous colonic polyps    Hyperlipemia    Hyperlipidemia    Hypertension    Melanoma of right side of neck ([place])    removed posterior neck 12/312013   Myocardial infarction ([place]) 2000   Pre-diabetes    checks cbg q month   Prostate cancer ([place])    Wears dentures    upper-lower partial   Wears glasses    Past Surgical History:  Procedure Laterality Date   CARDIAC CATHETERIZATION N/A 12/20/2015   Procedure: Left Heart Cath and Coronary Angiography;  Surgeon: Belva Crome, MD;  Location: [place] [facility];  Service: Cardiovascular;  Laterality: N/A;   CARDIAC CATHETERIZATION N/A 12/20/2015   Procedure: Coronary Stent Intervention;  Surgeon: Belva Crome, MD;  Location: [place] [facility];  Service: Cardiovascular;  Laterality: N/A;   CATARACT EXTRACTION W/ INTRAOCULAR LENS  IMPLANT, BILATERAL Bilateral 2011   CIRCUMCISION  age 3   COLONOSCOPY  2012   CORONARY ANGIOPLASTY WITH STENT PLACEMENT  2000   "1 stent"   CORONARY ANGIOPLASTY WITH STENT PLACEMENT  12/20/2015   "1 stent"   CYSTOSCOPY  09/19/2020   Procedure: CYSTOSCOPY FLEXIBLE;  Surgeon: Janith Lima,  MD;  Location: [facility];  Service: Urology;;  no seeds found in bladder   CYSTOSCOPY/URETEROSCOPY/HOLMIUM LASER/STENT PLACEMENT Right 08/03/2019   Procedure: CYSTOSCOPY/RETROGRADE/URETEROSCOPY//STENT PLACEMENT;  Surgeon: Ceasar Mons, MD;  Location: [facility];  Service: Urology;  Laterality: Right;   HEMORRHOID SURGERY     "burned them off"   MELANOMA EXCISION WITH SENTINEL LYMPH NODE BIOPSY  07/21/2012   Procedure: MELANOMA EXCISION WITH SENTINEL LYMPH NODE BIOPSY;  Surgeon: Rozetta Nunnery, MD;  Location: [place];  Service: General;  Laterality: N/A;  melanoma excision with sentinel node biopsy   posterior right neck   PROSTATE BIOPSY  06/2020   RADIOACTIVE SEED IMPLANT N/A 09/19/2020   Procedure: RADIOACTIVE SEED IMPLANT/BRACHYTHERAPY IMPLANT;  Surgeon: Janith Lima, MD;  Location: [facility];  Service: Urology;  Laterality: N/A;   81  seeds implanted   rotator cuff surgery  08/2019   [facility]   SPACE OAR INSTILLATION N/A 09/19/2020   Procedure: SPACE OAR INSTILLATION;  Surgeon: Janith Lima, MD;  Location: [facility];  Service: Urology;  Laterality: N/A;   TONSILLECTOMY  1961    Allergies  Allergies  Allergen Reactions   Shrimp [Shellfish Allergy] Nausea And Vomiting    History of Present Illness    GRAESYN SCHREIFELS has  a PMH of hypertension, coronary artery disease, prostate CA, right shoulder pain, HLD, and prediabetes.  He underwent cardiac catheterization in 2003 and received PCI to his LAD with Cypher stent in [facility].  He was placed on aspirin and Plavix.  In the spring 2017 he presented with exertional angina.  He had a exercise stress test which showed abnormalities.  He underwent cardiac catheterization 5/17 which showed 99% proximal LAD stenosis, proximal to previous stent.  He was treated with DES.  He underwent follow-up stress testing 6/18  which showed significant improvement.  Ezetimibe was added to his medication regimen.  He did not tolerate the medication well and was switched to Vytorin which she had tolerated in the past.  He did continue to have symptoms with taking 10/40 mg and the medication was stopped.  He was then placed on rosuvastatin 5 mg daily.  He contracted COVID-19 in December 2021.  In March 2022 he had radioactive seeds implanted for prostate CA.  He also had no skin surgery in June.  He followed up with Dr. [name] 06/12/2021.  During that time he continued to do well.  He reported rare chest discomfort.  He did note some dyspnea with the initiation of increased physical activity that would resolve.  He did note some fatigue.  He noted that he walked a good deal on his [place] property.  He denied palpitations.  He presents to the clinic today for follow-up evaluation and states he continues to be very physically active around his property.  He reports that he was recently running his chainsaw and cutting wood.  He enjoys walking daily.  He does note that he has developed a tremor that has been progressive over the last year.  He started noticing the tremor around 5 years ago.  He feels that his progression may be related to his rosuvastatin.  He also reports increased blood sugar.  He was recently started on ezetimibe and feels he is tolerating it well.  We reviewed his previous cardiac catheterizations.  He does report some dietary indiscretion.  His blood pressure is well-controlled.  I will refer him to the lipid clinic to discuss bempedoic acid, injectables, and for prior authorization for cholesterol-lowering medications.  Will plan follow-up in 12 months.  Today he denies chest pain, shortness of breath, lower extremity edema, fatigue, palpitations, melena, hematuria, hemoptysis, diaphoresis, weakness, presyncope, syncope, orthopnea, and PND.    Home Medications    Prior to Admission medications   Medication  Sig Start Date End Date Taking? Authorizing Provider  aspirin 81 MG tablet Take 81 mg by mouth daily.    [provider]  carvedilol (COREG) 12.5 MG tablet Take 12.5 mg by mouth at bedtime.    [provider]  clopidogrel (PLAVIX) 75 MG tablet Take 75 mg by mouth at bedtime.     [provider]  co-enzyme Q-10 50 MG capsule Take 50 mg by mouth daily.    [provider]  Enalapril-Hydrochlorothiazide 5-12.5 MG tablet Take 1 tablet by mouth daily. 10/29/16   [provider]  finasteride (PROSCAR) 5 MG tablet Take 1 tablet (5 mg total) by mouth daily. 06/27/20   Bruning, Ashlyn, PA-C  fluticasone (CUTIVATE) 0.05 % cream  09/01/20   [provider]  nitroGLYCERIN (NITROSTAT) 0.4 MG SL tablet Place 1 tablet (0.4 mg total) under the tongue every 5 (five) minutes as needed for chest pain (up to 3 doses). 12/21/15   Charlie Pitter,  PA-C  rosuvastatin (CRESTOR) 10 MG tablet Take 10 mg by mouth daily.    [provider]  tamsulosin (FLOMAX) 0.4 MG CAPS capsule Take 0.4 mg by mouth daily. 06/05/20   [provider]    Family History    Family History  Problem Relation Age of Onset   Heart attack Mother 3   Cancer - Other Father 23       pancreatic   Pancreatic cancer Father    Heart attack Brother 46   Cancer - Colon Brother 47   Cancer - Other Sister 34   Pancreatic cancer Sister    Prostate cancer Neg Hx    Breast cancer Neg Hx    He indicated that his mother is deceased. He indicated that his father is deceased. He indicated that one of his two sisters is deceased. He indicated that two of his four brothers are alive. He indicated that his maternal grandmother is deceased. He indicated that his maternal grandfather is deceased. He indicated that his paternal grandmother is deceased. He indicated that his paternal grandfather is deceased. He indicated that his son is deceased. He indicated that the status of his neg hx is  unknown.  Social History    Social History   Socioeconomic History   Marital status: Married    Spouse name: Not on file   Number of children: 2   Years of education: Not on file   Highest education level: Not on file  Occupational History   Not on file  Tobacco Use   Smoking status: Former    Packs/day: 2.00    Years: 10.00    Total pack years: 20.00    Types: Cigarettes    Quit date: 07/10/1973    Years since quitting: 48.9   Smokeless tobacco: Former    Types: Chew   Tobacco comments:    "chewed a little; stopped in 06/1973"  Vaping Use   Vaping Use: Never used  Substance and Sexual Activity   Alcohol use: Not Currently    Comment: 12/20/2015 'quit in ~ 2012-2013"   Drug use: No   Sexual activity: Not Currently  Other Topics Concern   Not on file  Social History Narrative   Not on file   Social Determinants of Health   Financial Resource Strain: Not on file  Food Insecurity: Not on file  Transportation Needs: Not on file  Physical Activity: Not on file  Stress: Not on file  Social Connections: Not on file  Intimate Partner Violence: Not on file     Review of Systems    General:  No chills, fever, night sweats or weight changes.  Cardiovascular:  No chest pain, dyspnea on exertion, edema, orthopnea, palpitations, paroxysmal nocturnal dyspnea. Dermatological: No rash, lesions/masses Respiratory: No cough, dyspnea Urologic: No hematuria, dysuria Abdominal:   No nausea, vomiting, diarrhea, bright red blood per rectum, melena, or hematemesis Neurologic:  No visual changes, wkns, changes in mental status. All other systems reviewed and are otherwise negative except as noted above.  Physical Exam    VS:  BP 136/70   Pulse 64   Ht '5\' 11"'$  (1.803 m)   Wt 173 lb 12.8 oz (78.8 kg)   BMI 24.24 kg/m  , BMI Body mass index is 24.24 kg/m. GEN: Well nourished, well developed, in no acute distress. HEENT: normal. Neck: Supple, no JVD, carotid bruits, or  masses. Cardiac: RRR, no murmurs, rubs, or gallops. No clubbing, cyanosis, edema.  Radials/DP/PT 2+ and equal  bilaterally.  Respiratory:  Respirations regular and unlabored, clear to auscultation bilaterally. GI: Soft, nontender, nondistended, BS + x 4. MS: no deformity or atrophy. Skin: warm and dry, no rash. Neuro:  Strength and sensation are intact.  Bilateral upper extremity tremor Psych: Normal affect.  Accessory Clinical Findings    Recent Labs: No results found for requested labs within last 365 days.   Recent Lipid Panel    Component Value Date/Time   CHOL 196 04/15/2019 0830   TRIG 61 04/15/2019 0830   HDL 69 04/15/2019 0830   CHOLHDL 2.8 04/15/2019 0830   LDLCALC 116 (H) 04/15/2019 0830         ECG personally reviewed by me today-normal sinus rhythm septal infarct undetermined age 44 BPM- No acute changes  Exercise stress test 12/25/2016  Blood pressure demonstrated a hypertensive response to exercise. Horizontal ST segment depression ST segment depression of 1 mm was noted during stress in the II, III, aVF, V5 and V6 leads, and returning to baseline after less than 1 minute of recovery.   1. Excellent exercise tolerance.  2. Hypertensive BP response.  3. Equivocal changes on exercise ECG: about 1 mm horizontal ST depression in inferior leads and V5,V6 though difficult to tell for sure due to baseline artifact.  ST segments returned to baseline quickly in recovery.    Overall low risk study with excellent exercise tolerance but not normal.  Assessment & Plan   1.  Coronary artery disease-denies recent episodes of arm neck back or chest discomfort.  Has had multiple cardiac catheterizations.  Last cardiac catheterization 3/17 where he received PCI with DES to his proximal LAD.  Exercise stress test 6/18 showed low risk and was improved when compared to previous study. Continue aspirin, Plavix, co-Q10, rosuvastatin, carvedilol Heart healthy low-sodium diet-salty 6  given Increase physical activity as tolerated  Hyperlipidemia-LDL 105 on 8/23.  Did not tolerate Lipitor, Vytorin, ezetimibe.  Repatha was too expensive.  Feels that his tremor has been made worse by rosuvastatin over the last year. Continue ezetimibe, co-Q10 Stop rosuvastatin Heart healthy low-sodium diet-salty 6 given Increase physical activity as tolerated Refer to lipid clinic for review of bempedoic acid, PCSK9 and other injectables.  Essential hypertension-BP today 136/70 Continue enalapril, HCTZ, carvedilol Heart healthy low-sodium diet-salty 6 given Increase physical activity as tolerated  Disposition: Follow-up with Dr. [name] or me in 12 months.   [name]. Tempestt Silba NP-C     06/12/2022, 12:26 PM [place] [address] Office [phone] Fax [phone]    I spent 14 minutes examining this patient, reviewing medications, and using patient centered shared decision making involving her cardiac care.  Prior to her visit I spent greater than 20 minutes reviewing her past medical history,  medications, and prior cardiac tests.

## 2022-06-12 ENCOUNTER — Encounter: Payer: Self-pay | Admitting: General Practice

## 2022-06-12 ENCOUNTER — Ambulatory Visit: Payer: Medicare Other | Attending: General Practice | Admitting: General Practice

## 2022-06-12 VITALS — BP 136/70 | HR 64 | Ht 71.0 in | Wt 173.8 lb

## 2022-06-12 DIAGNOSIS — I25118 Atherosclerotic heart disease of native coronary artery with other forms of angina pectoris: Secondary | ICD-10-CM

## 2022-06-12 DIAGNOSIS — I10 Essential (primary) hypertension: Secondary | ICD-10-CM | POA: Diagnosis not present

## 2022-06-12 DIAGNOSIS — E7800 Pure hypercholesterolemia, unspecified: Secondary | ICD-10-CM

## 2022-06-12 NOTE — Patient Instructions (Addendum)
Medication Instructions:  STOP ROSUVASTATIN *If you need a refill on your cardiac medications before your next appointment, please call your pharmacy*   Lab Work: NONE If you have labs (blood work) drawn today and your tests are completely normal, you will receive your results only by:  [place] (if you have MyChart) OR  A paper copy in the mail  If you have any lab test that is abnormal or we need to change your treatment, we will call you to review the results.   Testing/Procedures: NONE   Follow-Up: At [facility], you and your health needs are our priority.  As part of our continuing mission to provide you with exceptional heart care, we have created designated Provider Care Teams.  These Care Teams include your primary Cardiologist (physician) and Advanced Practice Providers (APPs -  Physician Assistants and Nurse Practitioners) who all work together to provide you with the care you need, when you need it.  Your next appointment:   12 month(s)  The format for your next appointment:   In Person  Provider:   Peter [name], MD     Other Instructions FOLLOW ATTACHED INCREASED FIBER DIET  MAINTAIN PHYSICAL ACTIVITY  REFERRAL TO LIPID CLINIC-PHARMACIST  Important Information About Sugar       High-Fiber Eating Plan Fiber, also called dietary fiber, is a type of carbohydrate. It is found foods such as fruits, vegetables, whole grains, and beans. A high-fiber diet can have many health benefits. Your health care provider may recommend a high-fiber diet to help: Prevent constipation. Fiber can make your bowel movements more regular. Lower your cholesterol. Relieve the following conditions: Inflammation of veins in the anus (hemorrhoids). Inflammation of specific areas of the digestive tract (uncomplicated diverticulosis). A problem of the large intestine, also called the colon, that sometimes causes pain and diarrhea (irritable bowel syndrome, or  IBS). Prevent overeating as part of a weight-loss plan. Prevent heart disease, type 2 diabetes, and certain cancers. What are tips for following this plan? Reading food labels  Check the nutrition facts label on food products for the amount of dietary fiber. Choose foods that have 5 grams of fiber or more per serving. The goals for recommended daily fiber intake include: Men (age 18 or younger): 34-38 g. Men (over age 8): 28-34 g. Women (age 55 or younger): 25-28 g. Women (over age 2): 22-25 g. Your daily fiber goal is _____________ g. Shopping Choose whole fruits and vegetables instead of processed forms, such as apple juice or applesauce. Choose a wide variety of high-fiber foods such as avocados, lentils, oats, and kidney beans. Read the nutrition facts label of the foods you choose. Be aware of foods with added fiber. These foods often have high sugar and sodium amounts per serving. Cooking Use whole-grain flour for baking and cooking. Cook with brown rice instead of white rice. Meal planning Start the day with a breakfast that is high in fiber, such as a cereal that contains 5 g of fiber or more per serving. Eat breads and cereals that are made with whole-grain flour instead of refined flour or white flour. Eat brown rice, bulgur wheat, or millet instead of white rice. Use beans in place of meat in soups, salads, and pasta dishes. Be sure that half of the grains you eat each day are whole grains. General information You can get the recommended daily intake of dietary fiber by: Eating a variety of fruits, vegetables, grains, nuts, and beans. Taking a fiber supplement  if you are not able to take in enough fiber in your diet. It is better to get fiber through food than from a supplement. Gradually increase how much fiber you consume. If you increase your intake of dietary fiber too quickly, you may have bloating, cramping, or gas. Drink plenty of water to help you digest  fiber. Choose high-fiber snacks, such as berries, raw vegetables, nuts, and popcorn. What foods should I eat? Fruits Berries. Pears. Apples. Oranges. Avocado. Prunes and raisins. Dried figs. Vegetables Sweet potatoes. Spinach. Kale. Artichokes. Cabbage. Broccoli. Cauliflower. Green peas. Carrots. Squash. Grains Whole-grain breads. Multigrain cereal. Oats and oatmeal. Brown rice. Barley. Bulgur wheat. [place]. Quinoa. Bran muffins. Popcorn. Rye wafer crackers. Meats and other proteins Navy beans, kidney beans, and pinto beans. Soybeans. Split peas. Lentils. Nuts and seeds. Dairy Fiber-fortified yogurt. Beverages Fiber-fortified soy milk. Fiber-fortified orange juice. Other foods Fiber bars. The items listed above may not be a complete list of recommended foods and beverages. Contact a dietitian for more information. What foods should I avoid? Fruits Fruit juice. Cooked, strained fruit. Vegetables Fried potatoes. Canned vegetables. Well-cooked vegetables. Grains White bread. Pasta made with refined flour. White rice. Meats and other proteins Fatty cuts of meat. Fried chicken or fried fish. Dairy Milk. Yogurt. Cream cheese. Sour cream. Fats and oils Butters. Beverages Soft drinks. Other foods Cakes and pastries. The items listed above may not be a complete list of foods and beverages to avoid. Talk with your dietitian about what choices are best for you. Summary Fiber is a type of carbohydrate. It is found in foods such as fruits, vegetables, whole grains, and beans. A high-fiber diet has many benefits. It can help to prevent constipation, lower blood cholesterol, aid weight loss, and reduce your risk of heart disease, diabetes, and certain cancers. Increase your intake of fiber gradually. Increasing fiber too quickly may cause cramping, bloating, and gas. Drink plenty of water while you increase the amount of fiber you consume. The best sources of fiber include whole fruits and  vegetables, whole grains, nuts, seeds, and beans. This information is not intended to replace advice given to you by your health care provider. Make sure you discuss any questions you have with your health care provider. Document Revised: 11/11/2019 Document Reviewed: 11/11/2019 Elsevier Patient Education  [place].

## 2022-07-19 ENCOUNTER — Encounter: Payer: Self-pay | Admitting: Pharmacist Clinician (PhC)/ Clinical Pharmacy Specialist

## 2022-07-19 ENCOUNTER — Ambulatory Visit
Payer: Medicare Other | Attending: Cardiovascular Disease | Admitting: Pharmacist Clinician (PhC)/ Clinical Pharmacy Specialist

## 2022-07-19 DIAGNOSIS — E7800 Pure hypercholesterolemia, unspecified: Secondary | ICD-10-CM

## 2022-07-19 DIAGNOSIS — Z1389 Encounter for screening for other disorder: Secondary | ICD-10-CM

## 2022-07-19 NOTE — Patient Instructions (Signed)
Your Results:             Your most recent labs Goal  Total Cholesterol 196 < 200  Triglycerides 61 < 150  HDL (happy/good cholesterol) 69 > 40  LDL (lousy/bad cholesterol 116 < 70   Medication changes:  Stop ezetimibe 10 mg tablets  Start Nexlizet 180/10 mg once daily in the evenings.  After 3 weeks, please go to the lab and get uric acid level checked.   This will tell us if you are a high risk for developing gout.  Once I get those results back, I will do the prior authorization to get insurance approval.    Lab orders:  We want to repeat labs after 3 weeks.  We will send you a lab order to remind you once we get closer to that time.    Pharmacy Card CARD NO. [id number]   CARD STATUS Active   BIN [id number]   PCN [id number]   PC GROUP [id number]   HELP DESK [id number]  Thank you for choosing [facility]

## 2022-07-19 NOTE — Progress Notes (Signed)
Office Visit    Patient Name: Kevin Stewart Date of Encounter: 07/19/2022  Primary Care Provider:  [name], MD Primary Cardiologist:  Peter [name], MD  Chief Complaint    Hyperlipidemia   Significant Past Medical History   CAD 2003 DES to LAD; 2017 DES proximal to previous LAD stent  HTN Controlled, on enalapril hctz, carvedilol  Pre-diabetes Last A1c 2016 5.6     Allergies  Allergen Reactions   Shrimp [Shellfish Allergy] Nausea And Vomiting    History of Present Illness    Kevin Stewart is a 81 y.o. Stewart patient of Dr [name] in the office today with his wife,  to discuss options for lowering cholesterol.  He has had myalgias and weakness in the past on different statins, and at one time several years ago was prescribed Repatha, but it was cost prohibitive.  Today he tells me the Repatha was never picked up fro the pharmacy.  Currently he takes only ezetimibe 10 mg daily.  He is not interested in injectable medications, and overall would prefer not to take any additional meds.    Insurance Carrier:  [employer] Plan 3  Repatha - $47/month, coverage gap $150/month  LDL Cholesterol goal:  LDL < 70  Current Medications:   ezetimibe 10 mg qd  Previously tried:  atorvastatin, simvastatin, rosuvastatin - myalgias, nausea, weakness  Repatha was previously too expensive  Family Hx:  mother had MI and died at 70, father had pancreatic cancer, one brother had MI at 50; daughter deceased, son with high cholesterol, treated  Social Hx: Tobacco: no Alcohol: no    Diet:  more home cooked meals, does occasionally eat out; mix of protein, plenty of vegetables, cans from garden; occasional snack foods;   Exercise: lifts weights some, walks daily, gardens  Accessory Clinical Findings   Lab Results  Component Value Date   CHOL 196 04/15/2019   HDL 69 04/15/2019   LDLCALC 116 (H) 04/15/2019   TRIG 61 04/15/2019   CHOLHDL 2.8 04/15/2019    Lab  Results  Component Value Date   ALT 19 09/15/2020   AST 20 09/15/2020   ALKPHOS 42 09/15/2020   BILITOT 0.6 09/15/2020   Lab Results  Component Value Date   CREATININE 0.83 09/15/2020   BUN 15 09/15/2020   NA 137 09/15/2020   K 4.1 09/15/2020   CL 102 09/15/2020   CO2 28 09/15/2020   No results found for: "HGBA1C"  Home Medications    Current Outpatient Medications  Medication Sig Dispense Refill   aspirin 81 MG tablet Take 81 mg by mouth daily.     carvedilol (COREG) 12.5 MG tablet Take 12.5 mg by mouth at bedtime.     clopidogrel (PLAVIX) 75 MG tablet Take 75 mg by mouth at bedtime.      co-enzyme Q-10 50 MG capsule Take 50 mg by mouth daily.     Enalapril-Hydrochlorothiazide 5-12.5 MG tablet Take 1 tablet by mouth daily.     ezetimibe (ZETIA) 10 MG tablet Take 10 mg by mouth daily.     fluticasone (CUTIVATE) 0.05 % cream      nitroGLYCERIN (NITROSTAT) 0.4 MG SL tablet Place 1 tablet (0.4 mg total) under the tongue every 5 (five) minutes as needed for chest pain (up to 3 doses). 25 tablet 3   tamsulosin (FLOMAX) 0.4 MG CAPS capsule Take 0.4 mg by mouth daily.     No current facility-administered medications for this visit.  Assessment & Plan    Hyperlipemia Assessment: Patient with ASCVD not at LDL goal of < 70 Has been compliant with ezetimibe  Not able to tolerate statins secondary to myalgias and fatigue Reviewed options for lowering LDL cholesterol, including bempedoic acid.  Patient was not interested in any injectable medications at this time.  Discussed mechanisms of action, dosing, side effects, potential decreases in LDL cholesterol and costs.  Also reviewed cost information and potential options for patient assistance.  Plan: Patient agreeable to starting Nexlizet.  Will stop ezetimibe tablets for now.  Repeat labs after: 3 weeks Uric acid - if normal, will start PA for Nexlizet.   BMET Patient was registered with [employer] while in  office today.   Tommy Medal, PharmD CPP [facility] [address] [place]  [place], [place] [address] [phone]  07/19/2022, 2:54 PM

## 2022-07-19 NOTE — Assessment & Plan Note (Signed)
Assessment: Patient with ASCVD not at LDL goal of < 70 Has been compliant with ezetimibe  Not able to tolerate statins secondary to myalgias and fatigue Reviewed options for lowering LDL cholesterol, including bempedoic acid.  Patient was not interested in any injectable medications at this time.  Discussed mechanisms of action, dosing, side effects, potential decreases in LDL cholesterol and costs.  Also reviewed cost information and potential options for patient assistance.  Plan: Patient agreeable to starting Nexlizet.  Will stop ezetimibe tablets for now.  Repeat labs after: 3 weeks Uric acid - if normal, will start PA for Nexlizet.   BMET Patient was registered with [employer] while in office today.

## 2022-07-25 DIAGNOSIS — L570 Actinic keratosis: Secondary | ICD-10-CM | POA: Diagnosis not present

## 2022-07-25 DIAGNOSIS — Z85820 Personal history of malignant melanoma of skin: Secondary | ICD-10-CM | POA: Diagnosis not present

## 2022-07-25 DIAGNOSIS — C44619 Basal cell carcinoma of skin of left upper limb, including shoulder: Secondary | ICD-10-CM | POA: Diagnosis not present

## 2022-07-25 DIAGNOSIS — Z08 Encounter for follow-up examination after completed treatment for malignant neoplasm: Secondary | ICD-10-CM | POA: Diagnosis not present

## 2022-07-25 DIAGNOSIS — D485 Neoplasm of uncertain behavior of skin: Secondary | ICD-10-CM | POA: Diagnosis not present

## 2022-07-25 DIAGNOSIS — L218 Other seborrheic dermatitis: Secondary | ICD-10-CM | POA: Diagnosis not present

## 2022-07-25 DIAGNOSIS — C4441 Basal cell carcinoma of skin of scalp and neck: Secondary | ICD-10-CM | POA: Diagnosis not present

## 2022-07-25 DIAGNOSIS — L309 Dermatitis, unspecified: Secondary | ICD-10-CM | POA: Diagnosis not present

## 2022-07-25 DIAGNOSIS — X32XXXA Exposure to sunlight, initial encounter: Secondary | ICD-10-CM | POA: Diagnosis not present

## 2022-07-25 DIAGNOSIS — Z872 Personal history of diseases of the skin and subcutaneous tissue: Secondary | ICD-10-CM | POA: Diagnosis not present

## 2022-07-25 DIAGNOSIS — Z09 Encounter for follow-up examination after completed treatment for conditions other than malignant neoplasm: Secondary | ICD-10-CM | POA: Diagnosis not present

## 2022-07-25 DIAGNOSIS — D2272 Melanocytic nevi of left lower limb, including hip: Secondary | ICD-10-CM | POA: Diagnosis not present

## 2022-07-25 DIAGNOSIS — L905 Scar conditions and fibrosis of skin: Secondary | ICD-10-CM | POA: Diagnosis not present

## 2022-07-25 DIAGNOSIS — Z85828 Personal history of other malignant neoplasm of skin: Secondary | ICD-10-CM | POA: Diagnosis not present

## 2022-07-25 DIAGNOSIS — C44519 Basal cell carcinoma of skin of other part of trunk: Secondary | ICD-10-CM | POA: Diagnosis not present

## 2022-08-05 ENCOUNTER — Telehealth: Payer: Self-pay | Admitting: Pharmacist Clinician (PhC)/ Clinical Pharmacy Specialist

## 2022-08-05 NOTE — Telephone Encounter (Signed)
Patient reports feeling nausea, headaches with Nexlizet.  Would like to discontinue and go back to just ezetimibe.   Asked if interested in PCSK9 inhibitor.  Patient states he will see PCP next month for physical and will discuss it with her.   He will call if he decides to try.

## 2022-08-07 ENCOUNTER — Telehealth: Payer: Self-pay

## 2022-08-07 NOTE — Patient Outreach (Signed)
  Care Coordination   08/07/2022 Name: Kevin Stewart MRN: [id number] DOB: Nov 11, 1940   Care Coordination Outreach Attempts:  An unsuccessful telephone outreach was attempted today to offer the patient information about available care coordination services as a benefit of their health plan.   Follow Up Plan:  Additional outreach attempts will be made to offer the patient care coordination information and services.   Encounter Outcome:  No Answer   Care Coordination Interventions:  No, not indicated    Tomasa Rand, RN, BSN, [facility] [employer] [phone]

## 2022-08-07 NOTE — Patient Outreach (Signed)
  Care Coordination   Initial Visit Note   08/07/2022 Name: ESPN ZEMAN MRN: [id number] DOB: [birth year]  [name] is a 82 y.o. year old male who sees Hamrick, Lorin Mercy, MD for primary care. I spoke with  [name] by phone today.  What matters to the patients health and wellness today?  Incoming call back from patient who reports that he is doing well. Reports his only concern is his cholesterol. Reports that he is working on [employer] and exercise. Reports he is in contact with his MD about his cholesterol. Reports that he has taking cholesterol medications for 20 plus years and it does not work well for him.  Denies any other needs today.   SDOH assessments and interventions completed:  No     Care Coordination Interventions:  No, not indicated   Follow up plan: No further intervention required.   Encounter Outcome:  Pt. Refused    Tomasa Rand, RN, BSN, CEN [facility] [employer] [phone]

## 2022-08-22 DIAGNOSIS — C44329 Squamous cell carcinoma of skin of other parts of face: Secondary | ICD-10-CM | POA: Diagnosis not present

## 2022-08-22 DIAGNOSIS — C44619 Basal cell carcinoma of skin of left upper limb, including shoulder: Secondary | ICD-10-CM | POA: Diagnosis not present

## 2022-08-22 DIAGNOSIS — D485 Neoplasm of uncertain behavior of skin: Secondary | ICD-10-CM | POA: Diagnosis not present

## 2022-08-22 DIAGNOSIS — C4441 Basal cell carcinoma of skin of scalp and neck: Secondary | ICD-10-CM | POA: Diagnosis not present

## 2022-08-22 DIAGNOSIS — L244 Irritant contact dermatitis due to drugs in contact with skin: Secondary | ICD-10-CM | POA: Diagnosis not present

## 2022-08-22 DIAGNOSIS — C44519 Basal cell carcinoma of skin of other part of trunk: Secondary | ICD-10-CM | POA: Diagnosis not present

## 2022-09-12 DIAGNOSIS — D3131 Benign neoplasm of right choroid: Secondary | ICD-10-CM | POA: Diagnosis not present

## 2022-09-12 DIAGNOSIS — H43813 Vitreous degeneration, bilateral: Secondary | ICD-10-CM | POA: Diagnosis not present

## 2022-09-12 DIAGNOSIS — H47323 Drusen of optic disc, bilateral: Secondary | ICD-10-CM | POA: Diagnosis not present

## 2022-09-12 DIAGNOSIS — M3501 Sicca syndrome with keratoconjunctivitis: Secondary | ICD-10-CM | POA: Diagnosis not present

## 2022-09-30 DIAGNOSIS — L910 Hypertrophic scar: Secondary | ICD-10-CM | POA: Diagnosis not present

## 2022-09-30 DIAGNOSIS — C44329 Squamous cell carcinoma of skin of other parts of face: Secondary | ICD-10-CM | POA: Diagnosis not present

## 2022-10-03 DIAGNOSIS — Z79899 Other long term (current) drug therapy: Secondary | ICD-10-CM | POA: Diagnosis not present

## 2022-10-03 DIAGNOSIS — I25118 Atherosclerotic heart disease of native coronary artery with other forms of angina pectoris: Secondary | ICD-10-CM | POA: Diagnosis not present

## 2022-10-03 DIAGNOSIS — R251 Tremor, unspecified: Secondary | ICD-10-CM | POA: Diagnosis not present

## 2022-10-03 DIAGNOSIS — I10 Essential (primary) hypertension: Secondary | ICD-10-CM | POA: Diagnosis not present

## 2022-10-03 DIAGNOSIS — E785 Hyperlipidemia, unspecified: Secondary | ICD-10-CM | POA: Diagnosis not present

## 2022-10-23 DIAGNOSIS — M79674 Pain in right toe(s): Secondary | ICD-10-CM | POA: Diagnosis not present

## 2022-10-23 DIAGNOSIS — M7731 Calcaneal spur, right foot: Secondary | ICD-10-CM | POA: Diagnosis not present

## 2022-10-23 DIAGNOSIS — B351 Tinea unguium: Secondary | ICD-10-CM | POA: Diagnosis not present

## 2022-10-23 DIAGNOSIS — M722 Plantar fascial fibromatosis: Secondary | ICD-10-CM | POA: Diagnosis not present

## 2022-10-23 DIAGNOSIS — M79671 Pain in right foot: Secondary | ICD-10-CM | POA: Diagnosis not present

## 2022-10-23 DIAGNOSIS — L600 Ingrowing nail: Secondary | ICD-10-CM | POA: Diagnosis not present

## 2022-10-23 DIAGNOSIS — M79675 Pain in left toe(s): Secondary | ICD-10-CM | POA: Diagnosis not present

## 2022-12-02 DIAGNOSIS — Z872 Personal history of diseases of the skin and subcutaneous tissue: Secondary | ICD-10-CM | POA: Diagnosis not present

## 2022-12-02 DIAGNOSIS — Z85820 Personal history of malignant melanoma of skin: Secondary | ICD-10-CM | POA: Diagnosis not present

## 2022-12-02 DIAGNOSIS — D485 Neoplasm of uncertain behavior of skin: Secondary | ICD-10-CM | POA: Diagnosis not present

## 2022-12-02 DIAGNOSIS — Z09 Encounter for follow-up examination after completed treatment for conditions other than malignant neoplasm: Secondary | ICD-10-CM | POA: Diagnosis not present

## 2022-12-02 DIAGNOSIS — D2272 Melanocytic nevi of left lower limb, including hip: Secondary | ICD-10-CM | POA: Diagnosis not present

## 2022-12-02 DIAGNOSIS — L570 Actinic keratosis: Secondary | ICD-10-CM | POA: Diagnosis not present

## 2022-12-02 DIAGNOSIS — C44619 Basal cell carcinoma of skin of left upper limb, including shoulder: Secondary | ICD-10-CM | POA: Diagnosis not present

## 2022-12-02 DIAGNOSIS — Z08 Encounter for follow-up examination after completed treatment for malignant neoplasm: Secondary | ICD-10-CM | POA: Diagnosis not present

## 2022-12-02 DIAGNOSIS — D0422 Carcinoma in situ of skin of left ear and external auricular canal: Secondary | ICD-10-CM | POA: Diagnosis not present

## 2022-12-02 DIAGNOSIS — X32XXXA Exposure to sunlight, initial encounter: Secondary | ICD-10-CM | POA: Diagnosis not present

## 2022-12-02 DIAGNOSIS — Z85828 Personal history of other malignant neoplasm of skin: Secondary | ICD-10-CM | POA: Diagnosis not present

## 2022-12-02 DIAGNOSIS — L218 Other seborrheic dermatitis: Secondary | ICD-10-CM | POA: Diagnosis not present

## 2022-12-11 DIAGNOSIS — D0422 Carcinoma in situ of skin of left ear and external auricular canal: Secondary | ICD-10-CM | POA: Diagnosis not present

## 2022-12-25 DIAGNOSIS — S01302A Unspecified open wound of left ear, initial encounter: Secondary | ICD-10-CM | POA: Diagnosis not present

## 2023-01-02 DIAGNOSIS — M3501 Sicca syndrome with keratoconjunctivitis: Secondary | ICD-10-CM | POA: Diagnosis not present

## 2023-01-02 DIAGNOSIS — R21 Rash and other nonspecific skin eruption: Secondary | ICD-10-CM | POA: Diagnosis not present

## 2023-01-02 DIAGNOSIS — H02055 Trichiasis without entropian left lower eyelid: Secondary | ICD-10-CM | POA: Diagnosis not present

## 2023-01-06 DIAGNOSIS — L237 Allergic contact dermatitis due to plants, except food: Secondary | ICD-10-CM | POA: Diagnosis not present

## 2023-02-12 DIAGNOSIS — L218 Other seborrheic dermatitis: Secondary | ICD-10-CM | POA: Diagnosis not present

## 2023-02-12 DIAGNOSIS — Z85820 Personal history of malignant melanoma of skin: Secondary | ICD-10-CM | POA: Diagnosis not present

## 2023-02-12 DIAGNOSIS — L570 Actinic keratosis: Secondary | ICD-10-CM | POA: Diagnosis not present

## 2023-02-12 DIAGNOSIS — C44619 Basal cell carcinoma of skin of left upper limb, including shoulder: Secondary | ICD-10-CM | POA: Diagnosis not present

## 2023-02-12 DIAGNOSIS — Z08 Encounter for follow-up examination after completed treatment for malignant neoplasm: Secondary | ICD-10-CM | POA: Diagnosis not present

## 2023-02-12 DIAGNOSIS — S01302A Unspecified open wound of left ear, initial encounter: Secondary | ICD-10-CM | POA: Diagnosis not present

## 2023-02-12 DIAGNOSIS — D2272 Melanocytic nevi of left lower limb, including hip: Secondary | ICD-10-CM | POA: Diagnosis not present

## 2023-02-12 DIAGNOSIS — X32XXXA Exposure to sunlight, initial encounter: Secondary | ICD-10-CM | POA: Diagnosis not present

## 2023-04-04 DIAGNOSIS — H02055 Trichiasis without entropian left lower eyelid: Secondary | ICD-10-CM | POA: Diagnosis not present

## 2023-04-07 DIAGNOSIS — I10 Essential (primary) hypertension: Secondary | ICD-10-CM | POA: Diagnosis not present

## 2023-04-07 DIAGNOSIS — E785 Hyperlipidemia, unspecified: Secondary | ICD-10-CM | POA: Diagnosis not present

## 2023-04-07 DIAGNOSIS — Z139 Encounter for screening, unspecified: Secondary | ICD-10-CM | POA: Diagnosis not present

## 2023-04-07 DIAGNOSIS — R251 Tremor, unspecified: Secondary | ICD-10-CM | POA: Diagnosis not present

## 2023-04-07 DIAGNOSIS — Z79899 Other long term (current) drug therapy: Secondary | ICD-10-CM | POA: Diagnosis not present

## 2023-04-07 DIAGNOSIS — Z9181 History of falling: Secondary | ICD-10-CM | POA: Diagnosis not present

## 2023-04-07 DIAGNOSIS — R739 Hyperglycemia, unspecified: Secondary | ICD-10-CM | POA: Diagnosis not present

## 2023-04-07 DIAGNOSIS — I25118 Atherosclerotic heart disease of native coronary artery with other forms of angina pectoris: Secondary | ICD-10-CM | POA: Diagnosis not present

## 2023-05-21 DIAGNOSIS — C44311 Basal cell carcinoma of skin of nose: Secondary | ICD-10-CM | POA: Diagnosis not present

## 2023-05-21 DIAGNOSIS — L218 Other seborrheic dermatitis: Secondary | ICD-10-CM | POA: Diagnosis not present

## 2023-05-21 DIAGNOSIS — Z85828 Personal history of other malignant neoplasm of skin: Secondary | ICD-10-CM | POA: Diagnosis not present

## 2023-05-21 DIAGNOSIS — D485 Neoplasm of uncertain behavior of skin: Secondary | ICD-10-CM | POA: Diagnosis not present

## 2023-05-21 DIAGNOSIS — D2272 Melanocytic nevi of left lower limb, including hip: Secondary | ICD-10-CM | POA: Diagnosis not present

## 2023-05-21 DIAGNOSIS — Z08 Encounter for follow-up examination after completed treatment for malignant neoplasm: Secondary | ICD-10-CM | POA: Diagnosis not present

## 2023-05-21 DIAGNOSIS — L570 Actinic keratosis: Secondary | ICD-10-CM | POA: Diagnosis not present

## 2023-05-21 DIAGNOSIS — X32XXXA Exposure to sunlight, initial encounter: Secondary | ICD-10-CM | POA: Diagnosis not present

## 2023-05-21 DIAGNOSIS — Z85820 Personal history of malignant melanoma of skin: Secondary | ICD-10-CM | POA: Diagnosis not present

## 2023-06-11 DIAGNOSIS — C44311 Basal cell carcinoma of skin of nose: Secondary | ICD-10-CM | POA: Diagnosis not present

## 2023-06-11 NOTE — Progress Notes (Signed)
Cardiology Clinic Note   Patient Name: Kevin Stewart Date of Encounter: 06/16/2023  Primary Care Provider:  Ailene Ravel, MD Primary Cardiologist:  Peter Swaziland, MD  Patient Profile    Kevin Stewart 82 year old male presents to the clinic today for follow-up evaluation of his coronary artery disease and essential hypertension.  Past Medical History    Past Medical History:  Diagnosis Date   Arthritis    "hands" (12/20/2015)   Basal cell carcinoma, face    Coronary artery disease    a. 2003 - light MI per patient; s/p Cypher DES to prox LAD in [facility].   COVID 06/28/2020   cough, headache fever x 2 weeks all symptoms resolved, took monoclonal antibodies infusion   History of kidney stones 1975, 2021   passed them 1975,  2021 sx done   History of migraine    none since mi in 2000   Hx of adenomatous colonic polyps    Hyperlipemia    Hyperlipidemia    Hypertension    Melanoma of right side of neck (HCC)    removed posterior neck 12/312013   Myocardial infarction (HCC) 2000   Pre-diabetes    checks cbg q month   Prostate cancer (HCC)    Wears dentures    upper-lower partial   Wears glasses    Past Surgical History:  Procedure Laterality Date   CARDIAC CATHETERIZATION N/A 12/20/2015   Procedure: Left Heart Cath and Coronary Angiography;  Surgeon: Lyn Records, MD;  Location: [facility] INVASIVE CV LAB;  Service: Cardiovascular;  Laterality: N/A;   CARDIAC CATHETERIZATION N/A 12/20/2015   Procedure: Coronary Stent Intervention;  Surgeon: Lyn Records, MD;  Location: [facility] INVASIVE CV LAB;  Service: Cardiovascular;  Laterality: N/A;   CATARACT EXTRACTION W/ INTRAOCULAR LENS  IMPLANT, BILATERAL Bilateral 2011   CIRCUMCISION  age 71   COLONOSCOPY  2012   CORONARY ANGIOPLASTY WITH STENT PLACEMENT  2000   "1 stent"   CORONARY ANGIOPLASTY WITH STENT PLACEMENT  12/20/2015   "1 stent"   CYSTOSCOPY  09/19/2020   Procedure: CYSTOSCOPY FLEXIBLE;  Surgeon: Jannifer Hick,  MD;  Location: [facility];  Service: Urology;;  no seeds found in bladder   CYSTOSCOPY/URETEROSCOPY/HOLMIUM LASER/STENT PLACEMENT Right 08/03/2019   Procedure: CYSTOSCOPY/RETROGRADE/URETEROSCOPY//STENT PLACEMENT;  Surgeon: Rene Paci, MD;  Location: [facility];  Service: Urology;  Laterality: Right;   HEMORRHOID SURGERY     "burned them off"   MELANOMA EXCISION WITH SENTINEL LYMPH NODE BIOPSY  07/21/2012   Procedure: MELANOMA EXCISION WITH SENTINEL LYMPH NODE BIOPSY;  Surgeon: Drema Halon, MD;  Location: [facility];  Service: General;  Laterality: N/A;  melanoma excision with sentinel node biopsy   posterior right neck   PROSTATE BIOPSY  06/2020   RADIOACTIVE SEED IMPLANT N/A 09/19/2020   Procedure: RADIOACTIVE SEED IMPLANT/BRACHYTHERAPY IMPLANT;  Surgeon: Jannifer Hick, MD;  Location: [facility];  Service: Urology;  Laterality: N/A;   52  seeds implanted   rotator cuff surgery  08/2019   [facility]   SPACE OAR INSTILLATION N/A 09/19/2020   Procedure: SPACE OAR INSTILLATION;  Surgeon: Jannifer Hick, MD;  Location: [facility];  Service: Urology;  Laterality: N/A;   TONSILLECTOMY  1961    Allergies  Allergies  Allergen Reactions   Shrimp [Shellfish Allergy] Nausea And Vomiting    History of Present Illness    Kevin Stewart has  a PMH of hypertension, coronary artery disease, prostate CA, right shoulder pain, HLD, and prediabetes.  He underwent cardiac catheterization in 2003 and received PCI to his LAD with Cypher stent in [facility].  He was placed on aspirin and Plavix.  In the spring 2017 he presented with exertional angina.  He had a exercise stress test which showed abnormalities.  He underwent cardiac catheterization 5/17 which showed 99% proximal LAD stenosis, proximal to previous stent.  He was treated with DES.  He underwent follow-up stress testing 6/18  which showed significant improvement.  Ezetimibe was added to his medication regimen.  He did not tolerate the medication well and was switched to Vytorin which she had tolerated in the past.  He did continue to have symptoms with taking 10/40 mg and the medication was stopped.  He was then placed on rosuvastatin 5 mg daily.  He contracted COVID-19 in December 2021.  In March 2022 he had radioactive seeds implanted for prostate CA.  He also had no skin surgery in June.  He followed up with Dr. Swaziland 06/12/2021.  During that time he continued to do well.  He reported rare chest discomfort.  He did note some dyspnea with the initiation of increased physical activity that would resolve.  He did note some fatigue.  He noted that he walked a good deal on [place].  He denied palpitations.  He presented to the clinic 06/12/22 for follow-up evaluation and stated he continued to be very physically active around his property.  He reported that he had been running his chainsaw and cutting wood.  He enjoyed walking daily.  He did note that he had developed a tremor that had been progressive over the last year.  He started noticing the tremor around 5 years ago.  He felt that his progression may be related to his rosuvastatin.  He also reported increased blood sugar.  Had also been started on ezetimibe and felt he was tolerating it well.  We reviewed his previous cardiac catheterizations.  He did report some dietary indiscretion.  His blood pressure was well-controlled.  I  refered him to the lipid clinic to discuss bempedoic acid, injectables, and for prior authorization for cholesterol-lowering medications.   Follow-up in 12 months was planned.  He was seen in follow-up by pharmacy on 07/19/2022.  His hyperlipidemia was reviewed.  He was agreeable to starting Nexlizet.  His ezetimibe was stopped.  He contacted the office 08/05/2022.  He reported symptoms of nausea and headache with the new medication.  It  was discontinued and he was resumed on ezetimibe.  He was asked if he would be interested in starting PSK 9 inhibitor.  He reported that he would follow-up with his PCP and discuss.  He presents to the clinic today for follow-up evaluation and states he continues to be physically active walking around his property.  He reports that over the last 12 months he has had 2 kids to sublingual nitroglycerin.  He does note some increased work of breathing with increased physical activity but is able to push through/past increased breathing and it normalizes.  He also continues to do resistance training.  We reviewed his visit with pharmacy.  At this time he does not wish to try any other cholesterol medications.  He has been taking berberine and is feeling better with the medication.  We reviewed fiber supplement options.  Will plan follow-up in 12 months.   Today he denies chest pain,  shortness of breath, lower extremity edema, fatigue, palpitations, melena, hematuria, hemoptysis, diaphoresis, weakness, presyncope, syncope, orthopnea, and PND.    Home Medications    Prior to Admission medications   Medication Sig Start Date End Date Taking? Authorizing Provider  aspirin 81 MG tablet Take 81 mg by mouth daily.    [provider]  carvedilol (COREG) 12.5 MG tablet Take 12.5 mg by mouth at bedtime.    [provider]  clopidogrel (PLAVIX) 75 MG tablet Take 75 mg by mouth at bedtime.     [provider]  co-enzyme Q-10 50 MG capsule Take 50 mg by mouth daily.    [provider]  Enalapril-Hydrochlorothiazide 5-12.5 MG tablet Take 1 tablet by mouth daily. 10/29/16   [provider]  finasteride (PROSCAR) 5 MG tablet Take 1 tablet (5 mg total) by mouth daily. 06/27/20   Bruning, Ashlyn, PA-C  fluticasone (CUTIVATE) 0.05 % cream  09/01/20   [provider]  nitroGLYCERIN (NITROSTAT) 0.4 MG SL tablet Place 1 tablet (0.4 mg total) under the tongue every 5 (five)  minutes as needed for chest pain (up to 3 doses). 12/21/15   Dunn, Tacey Ruiz, PA-C  rosuvastatin (CRESTOR) 10 MG tablet Take 10 mg by mouth daily.    [provider]  tamsulosin (FLOMAX) 0.4 MG CAPS capsule Take 0.4 mg by mouth daily. 06/05/20   [provider]    Family History    Family History  Problem Relation Age of Onset   Heart attack Mother 12   Cancer - Other Father 54       pancreatic   Pancreatic cancer Father    Heart attack Brother 71   Cancer - Colon Brother 54   Cancer - Other Sister 73   Pancreatic cancer Sister    Prostate cancer Neg Hx    Breast cancer Neg Hx    He indicated that his mother is deceased. He indicated that his father is deceased. He indicated that one of his two sisters is deceased. He indicated that two of his four brothers are alive. He indicated that his maternal grandmother is deceased. He indicated that his maternal grandfather is deceased. He indicated that his paternal grandmother is deceased. He indicated that his paternal grandfather is deceased. He indicated that his son is deceased. He indicated that the status of his neg hx is unknown.  Social History    Social History   Socioeconomic History   Marital status: Married    Spouse name: Not on file   Number of children: 2   Years of education: Not on file   Highest education level: Not on file  Occupational History   Not on file  Tobacco Use   Smoking status: Former    Current packs/day: 0.00    Average packs/day: 2.0 packs/day for 10.0 years (20.0 ttl pk-yrs)    Types: Cigarettes    Start date: 07/11/1963    Quit date: 07/10/1973    Years since quitting: 49.9   Smokeless tobacco: Former    Types: Chew   Tobacco comments:    "chewed a little; stopped in 06/1973"  Vaping Use   Vaping status: Never Used  Substance and Sexual Activity   Alcohol use: Not Currently    Comment: 12/20/2015 'quit in ~ 2012-2013"   Drug use: No   Sexual activity: Not Currently  Other  Topics Concern   Not on file  Social History Narrative   Not on file   Social Determinants  of Health   Financial Resource Strain: Not on file  Food Insecurity: Not on file  Transportation Needs: Not on file  Physical Activity: Not on file  Stress: Not on file  Social Connections: Not on file  Intimate Partner Violence: Not on file     Review of Systems    General:  No chills, fever, night sweats or weight changes.  Cardiovascular:  No chest pain, dyspnea on exertion, edema, orthopnea, palpitations, paroxysmal nocturnal dyspnea. Dermatological: No rash, lesions/masses Respiratory: No cough, dyspnea Urologic: No hematuria, dysuria Abdominal:   No nausea, vomiting, diarrhea, bright red blood per rectum, melena, or hematemesis Neurologic:  No visual changes, wkns, changes in mental status. All other systems reviewed and are otherwise negative except as noted above.  Physical Exam    VS:  BP 132/64   Pulse 66   Ht 5\' 10"  (1.778 m)   Wt 175 lb (79.4 kg)   SpO2 97%   BMI 25.11 kg/m  , BMI Body mass index is 25.11 kg/m. GEN: Well nourished, well developed, in no acute distress. HEENT: normal. Neck: Supple, no JVD, carotid bruits, or masses. Cardiac: RRR, no murmurs, rubs, or gallops. No clubbing, cyanosis, edema.  Radials/DP/PT 2+ and equal bilaterally.  Respiratory:  Respirations regular and unlabored, clear to auscultation bilaterally. GI: Soft, nontender, nondistended, BS + x 4. MS: no deformity or atrophy. Skin: warm and dry, no rash. Neuro:  Strength and sensation are intact.  Bilateral upper extremity tremor Psych: Normal affect.  Accessory Clinical Findings    Recent Labs: No results found for requested labs within last 365 days.   Recent Lipid Panel    Component Value Date/Time   CHOL 196 04/15/2019 0830   TRIG 61 04/15/2019 0830   HDL 69 04/15/2019 0830   CHOLHDL 2.8 04/15/2019 0830   LDLCALC 116 (H) 04/15/2019 0830         ECG personally reviewed by  me today-EKG Interpretation Date/Time:  Monday June 16 2023 11:31:19 EST Ventricular Rate:  66 PR Interval:  172 QRS Duration:  92 QT Interval:  376 QTC Calculation: 394 R Axis:   39  Text Interpretation: Normal sinus rhythm Septal infarct (cited on or before 20-Dec-2015) When compared with ECG of 10-Aug-2020 09:33, No significant change was found Confirmed by Edd Fabian [phone]) on 06/16/2023 11:53:31 AM  EKG 06/12/2022 normal sinus rhythm septal infarct undetermined age 7 BPM- No acute changes  Exercise stress test 12/25/2016  Blood pressure demonstrated a hypertensive response to exercise. Horizontal ST segment depression ST segment depression of 1 mm was noted during stress in the II, III, aVF, V5 and V6 leads, and returning to baseline after less than 1 minute of recovery.   1. Excellent exercise tolerance.  2. Hypertensive BP response.  3. Equivocal changes on exercise ECG: about 1 mm horizontal ST depression in inferior leads and V5,V6 though difficult to tell for sure due to baseline artifact.  ST segments returned to baseline quickly in recovery.    Overall low risk study with excellent exercise tolerance but not normal.  Assessment & Plan   1.  Coronary artery disease-denies episodes of chest discomfort.  History of multiple cardiac catheterizations.  Last cardiac catheterization 3/17 where he received PCI with DES to his proximal LAD.  His exercise stress test 6/18 showed low risk and was improved when compared to previous study. Continue current local therapy Heart healthy low-sodium diet-salty 6 reviewed Maintain physical activity  Essential hypertension-BP today 132/64 Continue enalapril, HCTZ, carvedilol  Heart healthy low-sodium diet Maintain blood pressure log  Hyperlipidemia-LDL 105 on 8/23.  Did not tolerate Lipitor, Vytorin, ezetimibe, Nexlizet.  Repatha was too expensive.  Previously noted that he felt that his tremor has been made worse by rosuvastatin  .  Was started on bempedoic acid by pharmacy.  Reported symptoms of nausea and medication was discontinued.  He was offered PCSK9 inhibitor and wishes to defer. Now using berberine 500 mg capsule daily Heart healthy low-sodium diet Maintain physical activity   Disposition: Follow-up with Dr. Swaziland or me in 12 months.   Thomasene Ripple. Wei Newbrough NP-C     06/16/2023, 12:11 PM [facility] [address] Office [phone] Fax [phone]    I spent 14 minutes examining this patient, reviewing medications, and using patient centered shared decision making involving her cardiac care.  Prior to her visit I spent greater than 20 minutes reviewing her past medical history,  medications, and prior cardiac tests.

## 2023-06-16 ENCOUNTER — Encounter: Payer: Self-pay | Admitting: General Practice

## 2023-06-16 ENCOUNTER — Ambulatory Visit: Payer: Medicare Other | Attending: General Practice | Admitting: General Practice

## 2023-06-16 VITALS — BP 132/64 | HR 66 | Ht 70.0 in | Wt 175.0 lb

## 2023-06-16 DIAGNOSIS — I10 Essential (primary) hypertension: Secondary | ICD-10-CM | POA: Diagnosis not present

## 2023-06-16 DIAGNOSIS — E7800 Pure hypercholesterolemia, unspecified: Secondary | ICD-10-CM

## 2023-06-16 DIAGNOSIS — I25118 Atherosclerotic heart disease of native coronary artery with other forms of angina pectoris: Secondary | ICD-10-CM | POA: Diagnosis not present

## 2023-06-16 NOTE — Patient Instructions (Signed)
Medication Instructions:  The current medical regimen is effective;  continue present plan and medications as directed. Please refer to the Current Medication list given to you today.  *If you need a refill on your cardiac medications before your next appointment, please call your pharmacy*  Lab Work: NON If you have labs (blood work) drawn today and your tests are completely normal, you will receive your results only by: MyChart Message (if you have MyChart) OR A paper copy in the mail If you have any lab test that is abnormal or we need to change your treatment, we will call you to review the results.   Other Instructions MAY USE BENEFIBER OR METAMUCIL INSTEAD OF THE BERBERINE  Follow-Up: At [facility], you and your health needs are our priority.  As part of our continuing mission to provide you with exceptional heart care, we have created designated Provider Care Teams.  These Care Teams include your primary Cardiologist (physician) and Advanced Practice Providers (APPs -  Physician Assistants and Nurse Practitioners) who all work together to provide you with the care you need, when you need it.  We recommend signing up for the patient portal called "MyChart".  Sign up information is provided on this After Visit Summary.  MyChart is used to connect with patients for Virtual Visits (Telemedicine).  Patients are able to view lab/test results, encounter notes, upcoming appointments, etc.  Non-urgent messages can be sent to your provider as well.   To learn more about what you can do with MyChart, go to [email or web address].    Your next appointment:   12 month(s)  Provider:   Peter Swaziland, MD

## 2023-07-04 DIAGNOSIS — H02055 Trichiasis without entropian left lower eyelid: Secondary | ICD-10-CM | POA: Diagnosis not present

## 2023-09-02 DIAGNOSIS — L821 Other seborrheic keratosis: Secondary | ICD-10-CM | POA: Diagnosis not present

## 2023-09-02 DIAGNOSIS — Z08 Encounter for follow-up examination after completed treatment for malignant neoplasm: Secondary | ICD-10-CM | POA: Diagnosis not present

## 2023-09-02 DIAGNOSIS — Z09 Encounter for follow-up examination after completed treatment for conditions other than malignant neoplasm: Secondary | ICD-10-CM | POA: Diagnosis not present

## 2023-09-02 DIAGNOSIS — D485 Neoplasm of uncertain behavior of skin: Secondary | ICD-10-CM | POA: Diagnosis not present

## 2023-09-02 DIAGNOSIS — Z86007 Personal history of in-situ neoplasm of skin: Secondary | ICD-10-CM | POA: Diagnosis not present

## 2023-09-02 DIAGNOSIS — L218 Other seborrheic dermatitis: Secondary | ICD-10-CM | POA: Diagnosis not present

## 2023-09-02 DIAGNOSIS — Z85828 Personal history of other malignant neoplasm of skin: Secondary | ICD-10-CM | POA: Diagnosis not present

## 2023-09-02 DIAGNOSIS — L820 Inflamed seborrheic keratosis: Secondary | ICD-10-CM | POA: Diagnosis not present

## 2023-09-02 DIAGNOSIS — L570 Actinic keratosis: Secondary | ICD-10-CM | POA: Diagnosis not present

## 2023-09-02 DIAGNOSIS — D2272 Melanocytic nevi of left lower limb, including hip: Secondary | ICD-10-CM | POA: Diagnosis not present

## 2023-09-02 DIAGNOSIS — X32XXXA Exposure to sunlight, initial encounter: Secondary | ICD-10-CM | POA: Diagnosis not present

## 2023-09-02 DIAGNOSIS — Z85820 Personal history of malignant melanoma of skin: Secondary | ICD-10-CM | POA: Diagnosis not present

## 2023-09-02 DIAGNOSIS — Z872 Personal history of diseases of the skin and subcutaneous tissue: Secondary | ICD-10-CM | POA: Diagnosis not present

## 2023-09-19 DIAGNOSIS — M3501 Sicca syndrome with keratoconjunctivitis: Secondary | ICD-10-CM | POA: Diagnosis not present

## 2023-09-19 DIAGNOSIS — H02055 Trichiasis without entropian left lower eyelid: Secondary | ICD-10-CM | POA: Diagnosis not present

## 2023-09-19 DIAGNOSIS — H47323 Drusen of optic disc, bilateral: Secondary | ICD-10-CM | POA: Diagnosis not present

## 2023-09-19 DIAGNOSIS — D3131 Benign neoplasm of right choroid: Secondary | ICD-10-CM | POA: Diagnosis not present

## 2023-09-19 DIAGNOSIS — H43813 Vitreous degeneration, bilateral: Secondary | ICD-10-CM | POA: Diagnosis not present

## 2023-10-07 DIAGNOSIS — E785 Hyperlipidemia, unspecified: Secondary | ICD-10-CM | POA: Diagnosis not present

## 2023-10-07 DIAGNOSIS — R7303 Prediabetes: Secondary | ICD-10-CM | POA: Diagnosis not present

## 2023-10-07 DIAGNOSIS — Z79899 Other long term (current) drug therapy: Secondary | ICD-10-CM | POA: Diagnosis not present

## 2023-10-07 DIAGNOSIS — I10 Essential (primary) hypertension: Secondary | ICD-10-CM | POA: Diagnosis not present

## 2023-10-07 DIAGNOSIS — R251 Tremor, unspecified: Secondary | ICD-10-CM | POA: Diagnosis not present

## 2023-10-07 DIAGNOSIS — I25118 Atherosclerotic heart disease of native coronary artery with other forms of angina pectoris: Secondary | ICD-10-CM | POA: Diagnosis not present

## 2023-10-08 ENCOUNTER — Encounter: Payer: Self-pay | Admitting: Family Medicine

## 2023-10-08 LAB — LAB REPORT - SCANNED: EGFR: 88

## 2023-10-10 ENCOUNTER — Other Ambulatory Visit: Payer: Self-pay

## 2023-10-10 DIAGNOSIS — E7800 Pure hypercholesterolemia, unspecified: Secondary | ICD-10-CM

## 2023-10-10 DIAGNOSIS — I25118 Atherosclerotic heart disease of native coronary artery with other forms of angina pectoris: Secondary | ICD-10-CM

## 2023-12-08 ENCOUNTER — Ambulatory Visit: Attending: Cardiology | Admitting: Pharmacist

## 2023-12-08 VITALS — BP 156/66

## 2023-12-08 DIAGNOSIS — E7800 Pure hypercholesterolemia, unspecified: Secondary | ICD-10-CM | POA: Diagnosis not present

## 2023-12-08 DIAGNOSIS — I10 Essential (primary) hypertension: Secondary | ICD-10-CM | POA: Diagnosis not present

## 2023-12-08 MED ORDER — PRAVASTATIN SODIUM 20 MG PO TABS: 20.0000 mg | ORAL_TABLET | Freq: Every evening | ORAL | 11 refills | Status: DC

## 2023-12-08 NOTE — Assessment & Plan Note (Signed)
 Assessment: Blood pressure elevated today in clinic.  Per patient is generally higher in clinic Reports blood pressure is well-controlled less than 130/80 at home His wife passed away in January Eats out frequently  Plan: Continue to monitor blood pressure at home Patient to let us  know if any elevations at home

## 2023-12-08 NOTE — Progress Notes (Signed)
 Patient ID: Kevin Stewart                 DOB: 1940/07/25                    MRN: [id number]      HPI: Kevin Stewart is a 83 y.o. male patient referred to lipid clinic by Dr. Swaziland. PMH is significant for hypertension, coronary artery disease, prostate CA, right shoulder pain, HLD, and prediabetes. He underwent cardiac catheterization in 2003 and received PCI to his LAD with Cypher stent in [facility] . He was placed on aspirin  and Plavix . In the spring 2017 he presented with exertional angina. He had a exercise stress test which showed abnormalities. He underwent cardiac catheterization 5/17 which showed 99% proximal LAD stenosis, proximal to previous stent. He was treated with DES.   Patient was seen by lipid clinic in Dec 2023. His rosuvastatin  was stopped due to compliants of a tremor and increased blood sugar. He was started on nexlizet, but stopped this due to nausea/headache. PCSK9i was discussed but he reported he wanted to discuss with PCP. In Nov 2024, he reported to [place] that he was not interested in any new cholesterol medications. Labs from PCP for March showed LDL-C 205. He was referred to lipid clinic.   Patient presents today to lipid clinic.  He reports that he is not interested in any injections.  Concerned about how long a reaction might last.  He reports that he had weakness and nausea to Nexlizet.  States he tolerated Vytorin  well for a while.  Feels as though it was stopped due to cost.  We discussed the reasoning behind treating his cholesterol.  Patient asked about the ability to check his coronary arteries currently.  Advised that regardless our recommendations would be the same, lowering his LDL cholesterol is one of our best ways of preventing further buildup of plaque.  Current Medications: none Intolerances: ezetimibe , atorvastatin , simvastatin , rosuvastatin , Nexlizet (nausea/headache, weakness) Risk Factors: progressive ASCVD, age, HTN, LDL-C  >190 LDL-C goal: <55 (at least <78) ApoB goal:   Diet: breakfast: 1 egg, bacon, toast, blueberries w/ cheerios Lunch: creamed potatoes, french fries, green beans, hot dogs, hamburger Dinner: fried fish Drink: water, unsweet tea   Exercise: walks 45 min 3-4 days a week, strength training daily (bands), yard work  Family History:   Social History: no tobacco, no ETOH  Labs: Lipid Panel     Component Value Date/Time   CHOL 196 04/15/2019 0830   TRIG 61 04/15/2019 0830   HDL 69 04/15/2019 0830   CHOLHDL 2.8 04/15/2019 0830   LDLCALC 116 (H) 04/15/2019 0830   LABVLDL 11 04/15/2019 0830    Past Medical History:  Diagnosis Date   Arthritis    "hands" (12/20/2015)   Basal cell carcinoma, face    Coronary artery disease    a. 2003 - light MI per patient; s/p Cypher DES to prox LAD in [facility].   COVID 06/28/2020   cough, headache fever x 2 weeks all symptoms resolved, took monoclonal antibodies infusion   History of kidney stones 1975, 2021   passed them 1975,  2021 sx done   History of migraine    none since mi in 2000   Hx of adenomatous colonic polyps    Hyperlipemia    Hyperlipidemia    Hypertension    Melanoma of right side of neck (HCC)    removed posterior neck 12/312013   Myocardial infarction (  HCC) 2000   Pre-diabetes    checks cbg q month   Prostate cancer (HCC)    Wears dentures    upper-lower partial   Wears glasses     Current Outpatient Medications on File Prior to Visit  Medication Sig Dispense Refill   aspirin  81 MG tablet Take 81 mg by mouth daily.     carvedilol  (COREG ) 12.5 MG tablet Take 12.5 mg by mouth at bedtime.     clopidogrel  (PLAVIX ) 75 MG tablet Take 75 mg by mouth at bedtime.      Enalapril -Hydrochlorothiazide  5-12.5 MG tablet Take 1 tablet by mouth daily.     nitroGLYCERIN  (NITROSTAT ) 0.4 MG SL tablet Place 1 tablet (0.4 mg total) under the tongue every 5 (five) minutes as needed for chest pain (up to 3 doses). 25 tablet 3    tamsulosin (FLOMAX) 0.4 MG CAPS capsule Take 0.4 mg by mouth daily.     co-enzyme Q-10 50 MG capsule Take 50 mg by mouth daily.     No current facility-administered medications on file prior to visit.    Allergies  Allergen Reactions   Shrimp [Shellfish Allergy] Nausea And Vomiting    Assessment/Plan:  1. Hyperlipidemia -  Hyperlipidemia Assessment: LDL-C is significantly elevated at 205 at last check with PCP Appears to be genetically driven Intolerances to several statins including atorvastatin , simvastatin , rosuvastatin  Also intolerant to Nexlizet No interest in injections Discussed the reasoning behind lowering LDL cholesterol for preventative medicine He still very active walks about 45 minutes 3 to 4 days a week and does strength training almost daily  Plan: Patient willing to try pravastatin  20 mg daily He will let us  know if he has any issues I will reach out to him in a few months to see how he is doing before checking labs  Hypertension Assessment: Blood pressure elevated today in clinic.  Per patient is generally higher in clinic Reports blood pressure is well-controlled less than 130/80 at home His wife passed away in January Eats out frequently  Plan: Continue to monitor blood pressure at home Patient to let us  know if any elevations at home    Thank you,  Isa Kohlenberg D Shianne Zeiser, Pharm.Monika Annas, CPP [facility] [address] [place], [place] [address]  Phone: [phone]; Fax: [phone]

## 2023-12-08 NOTE — Patient Instructions (Addendum)
 Please start taking pravastatin  20mg  daily Please call me at [phone] with any issues I will reach out to you in a few months to see how you are doing

## 2023-12-08 NOTE — Assessment & Plan Note (Signed)
 Assessment: LDL-C is significantly elevated at 205 at last check with PCP Appears to be genetically driven Intolerances to several statins including atorvastatin , simvastatin , rosuvastatin  Also intolerant to Nexlizet No interest in injections Discussed the reasoning behind lowering LDL cholesterol for preventative medicine He still very active walks about 45 minutes 3 to 4 days a week and does strength training almost daily  Plan: Patient willing to try pravastatin  20 mg daily He will let us  know if he has any issues I will reach out to him in a few months to see how he is doing before checking labs

## 2023-12-17 DIAGNOSIS — D3131 Benign neoplasm of right choroid: Secondary | ICD-10-CM | POA: Diagnosis not present

## 2023-12-17 DIAGNOSIS — H02055 Trichiasis without entropian left lower eyelid: Secondary | ICD-10-CM | POA: Diagnosis not present

## 2023-12-17 DIAGNOSIS — M3501 Sicca syndrome with keratoconjunctivitis: Secondary | ICD-10-CM | POA: Diagnosis not present

## 2023-12-24 ENCOUNTER — Telehealth: Payer: Self-pay | Admitting: Pharmacist

## 2023-12-24 NOTE — Telephone Encounter (Signed)
 Patient called and LVM that he can't take the medication I prescribed for him.

## 2024-03-02 DIAGNOSIS — Z86007 Personal history of in-situ neoplasm of skin: Secondary | ICD-10-CM | POA: Diagnosis not present

## 2024-03-02 DIAGNOSIS — Z85828 Personal history of other malignant neoplasm of skin: Secondary | ICD-10-CM | POA: Diagnosis not present

## 2024-03-02 DIAGNOSIS — Z85820 Personal history of malignant melanoma of skin: Secondary | ICD-10-CM | POA: Diagnosis not present

## 2024-03-02 DIAGNOSIS — L218 Other seborrheic dermatitis: Secondary | ICD-10-CM | POA: Diagnosis not present

## 2024-03-02 DIAGNOSIS — L821 Other seborrheic keratosis: Secondary | ICD-10-CM | POA: Diagnosis not present

## 2024-03-02 DIAGNOSIS — D2272 Melanocytic nevi of left lower limb, including hip: Secondary | ICD-10-CM | POA: Diagnosis not present

## 2024-03-02 DIAGNOSIS — B351 Tinea unguium: Secondary | ICD-10-CM | POA: Diagnosis not present

## 2024-03-02 DIAGNOSIS — C44619 Basal cell carcinoma of skin of left upper limb, including shoulder: Secondary | ICD-10-CM | POA: Diagnosis not present

## 2024-03-02 DIAGNOSIS — L570 Actinic keratosis: Secondary | ICD-10-CM | POA: Diagnosis not present

## 2024-03-02 DIAGNOSIS — X32XXXA Exposure to sunlight, initial encounter: Secondary | ICD-10-CM | POA: Diagnosis not present

## 2024-03-02 DIAGNOSIS — L308 Other specified dermatitis: Secondary | ICD-10-CM | POA: Diagnosis not present

## 2024-03-02 DIAGNOSIS — D485 Neoplasm of uncertain behavior of skin: Secondary | ICD-10-CM | POA: Diagnosis not present

## 2024-03-02 DIAGNOSIS — Z08 Encounter for follow-up examination after completed treatment for malignant neoplasm: Secondary | ICD-10-CM | POA: Diagnosis not present

## 2024-03-23 DIAGNOSIS — S51811A Laceration without foreign body of right forearm, initial encounter: Secondary | ICD-10-CM | POA: Diagnosis not present

## 2024-04-15 DIAGNOSIS — R7303 Prediabetes: Secondary | ICD-10-CM | POA: Diagnosis not present

## 2024-04-15 DIAGNOSIS — I10 Essential (primary) hypertension: Secondary | ICD-10-CM | POA: Diagnosis not present

## 2024-04-15 DIAGNOSIS — E785 Hyperlipidemia, unspecified: Secondary | ICD-10-CM | POA: Diagnosis not present

## 2024-04-15 DIAGNOSIS — I25118 Atherosclerotic heart disease of native coronary artery with other forms of angina pectoris: Secondary | ICD-10-CM | POA: Diagnosis not present

## 2024-04-15 DIAGNOSIS — Z23 Encounter for immunization: Secondary | ICD-10-CM | POA: Diagnosis not present

## 2024-04-15 DIAGNOSIS — Z9181 History of falling: Secondary | ICD-10-CM | POA: Diagnosis not present

## 2024-04-15 DIAGNOSIS — Z139 Encounter for screening, unspecified: Secondary | ICD-10-CM | POA: Diagnosis not present

## 2024-04-15 DIAGNOSIS — R251 Tremor, unspecified: Secondary | ICD-10-CM | POA: Diagnosis not present

## 2024-04-15 DIAGNOSIS — Z79899 Other long term (current) drug therapy: Secondary | ICD-10-CM | POA: Diagnosis not present

## 2024-04-16 LAB — LAB REPORT - SCANNED
A1c: 5.6
EGFR: 85

## 2024-04-19 ENCOUNTER — Telehealth: Payer: Self-pay | Admitting: Cardiology

## 2024-04-19 DIAGNOSIS — H02055 Trichiasis without entropian left lower eyelid: Secondary | ICD-10-CM | POA: Diagnosis not present

## 2024-04-19 DIAGNOSIS — H1013 Acute atopic conjunctivitis, bilateral: Secondary | ICD-10-CM | POA: Diagnosis not present

## 2024-04-19 DIAGNOSIS — M3501 Sicca syndrome with keratoconjunctivitis: Secondary | ICD-10-CM | POA: Diagnosis not present

## 2024-04-19 NOTE — Telephone Encounter (Signed)
 Spoke to patient he stated he has been having weak spells.Stated he has not blacked out.No chest pain.No fast heart beat.Advised Dr.Jordan is out of office this week.Appointment scheduled with Lum Louis NP 10/6 at 8:50 am.Advised if symptoms worsen he will need to go to ED.

## 2024-04-19 NOTE — Telephone Encounter (Signed)
 Spoke with pt who is concerned because he is having weak spells  He reports he has had these for a while, one in awhile but feels they are more frequent than before.  He reports they occur about once a week.  The y last a few minutes and resolve on their own.  He denies any cp, sob, fatigue, dizziness etc and actually feels better if he gets up and goes for a walk.  He reports they don't stop me from doing anything.  Occurred today.  BP was 128/60 and HT between 66-70 bpm.  He is asking to be seen sooner than his 11/24 appt.  Advised I will forward this information to Dr Swaziland and his nurse for review and they will continue him with further instructions.

## 2024-04-19 NOTE — Telephone Encounter (Signed)
 Pt complaining of weak spells. He has a little it of chest pain with these episodes. He is schedule for an annual f/u 06/14/24, but requesting a callback about his weakness. Please advise.

## 2024-04-26 ENCOUNTER — Telehealth (HOSPITAL_COMMUNITY): Payer: Self-pay | Admitting: *Deleted

## 2024-04-26 ENCOUNTER — Encounter: Payer: Self-pay | Admitting: Emergency Medicine

## 2024-04-26 ENCOUNTER — Telehealth: Payer: Self-pay | Admitting: Emergency Medicine

## 2024-04-26 ENCOUNTER — Ambulatory Visit: Attending: Emergency Medicine | Admitting: Emergency Medicine

## 2024-04-26 VITALS — BP 128/72 | HR 71 | Ht 70.0 in | Wt 168.0 lb

## 2024-04-26 DIAGNOSIS — E7800 Pure hypercholesterolemia, unspecified: Secondary | ICD-10-CM

## 2024-04-26 DIAGNOSIS — R079 Chest pain, unspecified: Secondary | ICD-10-CM | POA: Diagnosis not present

## 2024-04-26 DIAGNOSIS — I10 Essential (primary) hypertension: Secondary | ICD-10-CM | POA: Diagnosis not present

## 2024-04-26 DIAGNOSIS — I25118 Atherosclerotic heart disease of native coronary artery with other forms of angina pectoris: Secondary | ICD-10-CM

## 2024-04-26 DIAGNOSIS — R0609 Other forms of dyspnea: Secondary | ICD-10-CM

## 2024-04-26 NOTE — Progress Notes (Signed)
 Cardiology Office Note:    Date:  04/26/2024  ID:  Kevin Stewart, DOB 1941-01-13, MRN [id number] PCP: Stephanie Charlene LITTIE, MD  [facility] Providers Cardiologist:  Peter Swaziland, MD Cardiology APP:  Rana Lum LITTIE, NP       Patient Profile:       Chief Complaint: 1 year follow-up History of Present Illness:  Kevin Stewart is a 83 y.o. male with visit-pertinent history of hypertension, coronary artery disease, prostate cancer, right shoulder pain, hyperlipidemia, prediabetes  He underwent cardiac catheterization in 2003 and received PCI to his LAD.  He was placed on aspirin  and Plavix .  In the spring 2017 he presented with exertional angina.  He had a exercise stress test which showed acute abnormalities.  He underwent cardiac catheterization in May 2017 which showed 99% proximal LAD stenosis just proximal to previous stent.  This was treated with DES.  He underwent follow-up stress testing from June 2018 which showed significant improvement.  He was last seen in clinic on 06/16/2023 by Josefa, NP.  He was doing well without chest discomfort.  No changes to his medication regimen were made.  He was to follow-up in 1 year.  He was seen by Pharm.D. on 12/08/2023.  LDL was significantly elevated at 205.  He does have intolerances to several statins and also on Nexlizet.  He is not interested in injectable medications.  He was started on pravastatin  20 mg daily.   Discussed the use of AI scribe software for clinical note transcription with the patient, who gave verbal consent to proceed.  History of Present Illness Kevin Stewart is an 83 year old male with coronary artery disease who presents with fatigue and exertional symptoms.  He experiences fatigue, chest pains, shortness of breath, weakness, lightheadedness, nervousness, and tremors during physical activities like using a chainsaw or pruning saw, typically after an hour of exertion.  He does have a difficult time  explaining how long the symptoms have been going on.  He has experienced similar symptoms since his last catheterization in 2017.  However he does note that he does have worsening of his exertional symptoms. He has used nitroglycerin  three times this year for chest pain and shortness of breath, similar to post-heart attack symptoms.  States he does experience more exertional symptoms when is hot outside.  He does routinely use a chainsaw.  He is intolerant to multiple statins, including atorvastatin , simvastatin , pravastatin , and rosuvastatin , as well as Nexlizet, due to weakness.  He recently discontinued pravastatin  after two doses. His LDL was 208 in May.  He seems to be more interested in injectable therapy.  No leg swelling, weight gain, orthopnea, PND, syncope, presyncope, or changes in sleep. He lives alone following his wife's passing in January.   Review of systems:  Please see the history of present illness. All other systems are reviewed and otherwise negative.      Studies Reviewed:    EKG Interpretation Date/Time:  Monday April 26 2024 08:15:40 EDT Ventricular Rate:  71 PR Interval:  170 QRS Duration:  90 QT Interval:  362 QTC Calculation: 393 R Axis:   63  Text Interpretation: Normal sinus rhythm Anteroseptal infarct (cited on or before 20-Dec-2015) When compared with ECG of 16-Jun-2023 11:31, No significant change since last tracing Confirmed by Rana Lum [phone]) on 04/26/2024 9:14:19 AM    Exercise tolerance test 12/25/2016 Blood pressure demonstrated a hypertensive response to exercise. Horizontal ST segment depression ST segment depression of 1 mm  was noted during stress in the II, III, aVF, V5 and V6 leads, and returning to baseline after less than 1 minute of recovery.   1. Excellent exercise tolerance.  2. Hypertensive BP response.  3. Equivocal changes on exercise ECG: about 1 mm horizontal ST depression in inferior leads and V5,V6 though difficult to tell  for sure due to baseline artifact.  ST segments returned to baseline quickly in recovery.    Overall low risk study with excellent exercise tolerance but not normal.   Cardiac catheterization 12/20/2015 Prox RCA to Dist RCA lesion, 30% stenosed. Mid LAD lesion, 20% stenosed. The lesion was previously treated with a stent (unknown type). Prox LAD to Mid LAD lesion, 90% stenosed. Post intervention, there is a 10% residual stenosis.   90% stenosis proximal to the previously placed LAD stent within the heavily calcified segment. Dominant right coronary which wraps around the left ventricular apex. Minimal luminal irregularities are noted. Patent circumflex and left main. Successful percutaneous intervention with [place] angioplasty of the proximal stenosis followed by drug-eluting stent implantation reducing the 90% stenosis to 10% with TIMI grade 3 flow. Postdilatation was to 3.75 mm at 16 atm. Normal left ventricular systolic function with normal end-diastolic pressure.   Recommendations:   Continue Plavix  and aspirin  Discharge tomorrow if no complications overnight. Diagnostic Dominance: Right   Exercise tolerance test 12/19/2015 Horizontal ST segment depression ST segment depression of 3 mm was noted during stress in the II, III, aVF, V4 and V5 leads. AVR elevation Normal blood pressure response, 7 minutes of exercise-fair Shortness of breath with exercise. No chest pain. Abnormal exercise treadmill test-high risk. There is concern for global ischemia-possible triple-vessel disease   Risk Assessment/Calculations:              Physical Exam:   VS:  BP 128/72   Pulse 71   Ht 5' 10 (1.778 m)   Wt 168 lb (76.2 kg)   BMI 24.11 kg/m    Wt Readings from Last 3 Encounters:  04/26/24 168 lb (76.2 kg)  06/16/23 175 lb (79.4 kg)  06/12/22 173 lb 12.8 oz (78.8 kg)    GEN: Well nourished, well developed in no acute distress NECK: No JVD; No carotid bruits CARDIAC: RRR, no  murmurs, rubs, gallops RESPIRATORY:  Clear to auscultation without rales, wheezing or rhonchi  ABDOMEN: Soft, non-tender, non-distended EXTREMITIES:  No edema; No acute deformity      Assessment and Plan:  Coronary artery disease S/p stenting to proximal LAD in 2003 S/p DES to proximal-mid LAD on 11/2015 with 10% residual stenosis.  Proximal RCA-distal RCA 30% stenosed, previously placed mid LAD stent 20% ISR ETT 12/2016 was low risk - Today patient reports weakness, lightheadedness, shortness of breath, and chest pains on exertion.  Symptoms occur after about an hour of using the chainsaw or pruning saw.  He does have difficulty fully explaining his symptoms.  He does tell me he has had similar symptoms at least since his last catheterization in 2017 but symptoms have seemed to have worsened as of late.  He has taken nitroglycerin  x 3 within the past year which did resolve his symptoms - His LDL is also uncontrolled with an LDL of 208 with multiple statin intolerances - Plan for Lexiscan Myoview for further ischemic evaluation - BMET and CBC today - Continue chronic DAPT with aspirin  81 mg daily and clopidogrel  75 mg daily - Continue carvedilol  12.5 mg twice daily  Hyperlipidemia LDL 208 on 03/2024 and uncontrolled  Intolerances to several statins including atorvastatin , simvastatin , rosuvastatin , and most recently pravastatin .  Also intolerant to nexlizet - Last seen by Pharm.D. lipid clinic on 5/19 and had no interest in injectable medications at that time.  Today he reports that he is interested in injectable cholesterol medications after further education was given - Will refer back to Pharm.D. for consideration of Repatha   Hypertension Blood pressure today is well-controlled at 128/72 - Continue enalapril -hydrochlorothiazide  5-12.5 mg daily and carvedilol  12.5 mg twice daily    Informed Consent   Shared Decision Making/Informed Consent The risks [chest pain, shortness of breath,  cardiac arrhythmias, dizziness, blood pressure fluctuations, myocardial infarction, stroke/transient ischemic attack, nausea, vomiting, allergic reaction, radiation exposure, metallic taste sensation and life-threatening complications (estimated to be 1 in 10,000)], benefits (risk stratification, diagnosing coronary artery disease, treatment guidance) and alternatives of a nuclear stress test were discussed in detail with Mr. Mcdanel and he agrees to proceed.     Dispo:  Return in about 4 weeks (around 05/24/2024).  Signed, Lum LITTIE Louis, NP

## 2024-04-26 NOTE — Telephone Encounter (Signed)
 Left vm on pt's granddaughter's (Michaela) phone, per pt, informing her that the 06/02/24 visit has been canceled - keep 06/14/24 w/Dr. Swaziland.  JB, 04-26-24

## 2024-04-26 NOTE — Telephone Encounter (Signed)
 Patient given detailed instructions per Myocardial Perfusion Study Information Sheet for the test on 04/28/24  Patient notified to arrive 15 minutes early and that it is imperative to arrive on time for appointment to keep from having the test rescheduled.  If you need to cancel or reschedule your appointment, please call the office within 24 hours of your appointment. . Patient verbalized understanding.Claudene Ronal Quale, RN

## 2024-04-26 NOTE — Patient Instructions (Signed)
 Medication Instructions:  NO CHANGES  Lab Work: BMET AND CBC TO BE DONE TODAY.  Testing/Procedures: Your physician has requested that you have a lexiscan myoview. For further information please visit [email or web address] Please follow instruction sheet, as given.   Follow-Up: At [facility], you and your health needs are our priority.  As part of our continuing mission to provide you with exceptional heart care, our providers are all part of one team.  This team includes your primary Cardiologist (physician) and Advanced Practice Providers or APPs (Physician Assistants and Nurse Practitioners) who all work together to provide you with the care you need, when you need it.  Your next appointment:  NEED APPOINTMENT WITH PHARM-D  4 WEEKS  Provider:   MADISON FOUNTAIN, NP

## 2024-04-27 ENCOUNTER — Other Ambulatory Visit: Payer: Self-pay | Admitting: Emergency Medicine

## 2024-04-27 ENCOUNTER — Ambulatory Visit: Payer: Self-pay | Admitting: Emergency Medicine

## 2024-04-27 DIAGNOSIS — I25118 Atherosclerotic heart disease of native coronary artery with other forms of angina pectoris: Secondary | ICD-10-CM

## 2024-04-27 DIAGNOSIS — R0609 Other forms of dyspnea: Secondary | ICD-10-CM

## 2024-04-27 DIAGNOSIS — R079 Chest pain, unspecified: Secondary | ICD-10-CM

## 2024-04-27 LAB — BASIC METABOLIC PANEL WITH GFR
BUN/Creatinine Ratio: 13 (ref 10–24)
BUN: 10 mg/dL (ref 8–27)
CO2: 25 mmol/L (ref 20–29)
Calcium: 9.8 mg/dL (ref 8.6–10.2)
Chloride: 96 mmol/L (ref 96–106)
Creatinine, Ser: 0.76 mg/dL (ref 0.76–1.27)
Glucose: 92 mg/dL (ref 70–99)
Potassium: 4.2 mmol/L (ref 3.5–5.2)
Sodium: 135 mmol/L (ref 134–144)
eGFR: 90 mL/min/1.73 (ref >59)

## 2024-04-27 LAB — CBC
Hematocrit: 43.2 % (ref 37.5–51.0)
Hemoglobin: 14.6 g/dL (ref 13.0–17.7)
MCH: 30.6 pg (ref 26.6–33.0)
MCHC: 33.8 g/dL (ref 31.5–35.7)
MCV: 91 fL (ref 79–97)
Platelets: 213 x10E3/uL (ref 150–450)
RBC: 4.77 x10E6/uL (ref 4.14–5.80)
RDW: 12.6 % (ref 11.6–15.4)
WBC: 6.1 x10E3/uL (ref 3.4–10.8)

## 2024-04-28 ENCOUNTER — Ambulatory Visit (HOSPITAL_COMMUNITY)
Admission: RE | Admit: 2024-04-28 | Discharge: 2024-04-28 | Disposition: A | Discharge status: Home / Self Care | Source: Ambulatory Visit | Attending: Cardiology | Admitting: Cardiology

## 2024-04-28 DIAGNOSIS — R0609 Other forms of dyspnea: Secondary | ICD-10-CM | POA: Diagnosis not present

## 2024-04-28 DIAGNOSIS — R079 Chest pain, unspecified: Secondary | ICD-10-CM | POA: Insufficient documentation

## 2024-04-28 DIAGNOSIS — I25118 Atherosclerotic heart disease of native coronary artery with other forms of angina pectoris: Secondary | ICD-10-CM | POA: Insufficient documentation

## 2024-04-28 DIAGNOSIS — I2510 Atherosclerotic heart disease of native coronary artery without angina pectoris: Secondary | ICD-10-CM | POA: Diagnosis not present

## 2024-04-28 DIAGNOSIS — I700 Atherosclerosis of aorta: Secondary | ICD-10-CM | POA: Diagnosis not present

## 2024-04-28 LAB — MYOCARDIAL PERFUSION IMAGING
LV dias vol: 94 mL (ref 62–150)
LV sys vol: 35 mL (ref 4.2–5.8)
Nuc Stress EF: 63 %
Peak HR: 94 {beats}/min
Rest HR: 60 {beats}/min
Rest Nuclear Isotope Dose: 10.2 mCi
SDS: 0
SRS: 6
SSS: 0
ST Depression (mm): 0 mm
Stress Nuclear Isotope Dose: 32.6 mCi
TID: 1

## 2024-04-28 MED ORDER — TECHNETIUM TC 99M TETROFOSMIN IV KIT
10.2000 | PACK | Freq: Once | INTRAVENOUS | Status: AC | PRN
  Administered 2024-04-28: 10.2 via INTRAVENOUS

## 2024-04-28 MED ORDER — TECHNETIUM TC 99M TETROFOSMIN IV KIT
32.6000 | PACK | Freq: Once | INTRAVENOUS | Status: AC | PRN
  Administered 2024-04-28: 32.6 via INTRAVENOUS

## 2024-04-28 MED ORDER — REGADENOSON 0.4 MG/5ML IV SOLN
INTRAVENOUS | Status: AC
  Filled 2024-04-28: qty 5

## 2024-04-28 MED ORDER — REGADENOSON 0.4 MG/5ML IV SOLN
0.4000 mg | Freq: Once | INTRAVENOUS | Status: AC
  Administered 2024-04-28: 0.4 mg via INTRAVENOUS

## 2024-05-05 DIAGNOSIS — C44619 Basal cell carcinoma of skin of left upper limb, including shoulder: Secondary | ICD-10-CM | POA: Diagnosis not present

## 2024-05-11 ENCOUNTER — Ambulatory Visit: Payer: Self-pay | Admitting: Emergency Medicine

## 2024-05-28 ENCOUNTER — Ambulatory Visit: Admitting: Emergency Medicine

## 2024-06-01 ENCOUNTER — Ambulatory Visit: Attending: Emergency Medicine | Admitting: Pharmacist

## 2024-06-01 ENCOUNTER — Encounter: Payer: Self-pay | Admitting: Pharmacist

## 2024-06-01 DIAGNOSIS — E7849 Other hyperlipidemia: Secondary | ICD-10-CM | POA: Diagnosis not present

## 2024-06-01 NOTE — Assessment & Plan Note (Addendum)
 Assessment and plan:  LDLc >190 indicates possible FH, intolerance to most statins and Nexlizet  Stays active around the house and follows fairly healthy diet  Discussed injectables PCSK9i (self injections) vs. Leqvio  Patient has an upcoming appointment with Dr. Jordan on November 24 to seek a second opinion regarding starting  of Repatha .

## 2024-06-01 NOTE — Progress Notes (Signed)
 Patient ID: Kevin Stewart                 DOB: 05-23-1941                    MRN: [id number]      HPI: Kevin Stewart is a 83 y.o. male patient referred to lipid clinic by Kevin Louis, NP . PMH is significant for ypertension, coronary artery disease, prostate CA, right shoulder pain, HLD, and prediabetes. He underwent cardiac catheterization in 2003 and received PCI to his LAD with Cypher stent in [facility] . He was placed on aspirin  and Plavix . 2017-exercise stress test which showed abnormalities. He underwent cardiac catheterization 5/17 which showed 99% proximal LAD stenosis, proximal to previous stent. He was treated with DES.   Dec 2023-rosuvastatin  was stopped due to compliants of a tremor and increased blood sugar. He was started on nexlizet, but stopped this due to nausea/headache. PCSK9i was discussed but he reported he wanted to discuss with PCP  May 2025 - reported he had weakness and nausea to Nexlizet.  States he tolerated Vytorin  well for a while.  Feels as though it was stopped due to cost.  Pt was not interested in injections so pravastatin  20 mg was started.   Oct 2025 - pt saw NP, reported discontinued pravastatin  after two doses. His LDL was 208 in May.  He seems to be more interested in injectable therapy.  Patient presented today  with his son we discussed self injectiopn option and Leqvio, Discussed mechanisms of action, dosing, side effects and potential decreases in LDL cholesterol.  Also reviewed cost information and potential options for patient assistance.  Patient has an upcoming appointment with Dr. Jordan on November 24 to seek a second opinion regarding starting Repatha .  This follows a reassuring stress test result, and the patient may request a carotid artery ultrasound to further evaluate cardiovascular risk before making a decision about ongoing lipid-lowering therapy.  Current Medications: none  Intolerances: : ezetimibe , atorvastatin ,  simvastatin , rosuvastatin , pravastatin  - myalgia  Nexlizet (nausea/headache, weakness)  Risk Factors:  progressive ASCVD, age, HTN, LDL-C >190  possible FH, family hx of ASCVD  LDL goal: <55 mg/dl  Last lab: TC 717, TG 83, HDLc 60, LDLc 208   Diet: fairy good, states he eats out but avoids processed food   Exercise: at age of 68 stays active doing various project that involves lot of physical work   Family History:  Relation Problem Comments  Mother (Deceased) Heart attack (Age: 58)     Father (Deceased) Cancer - Other (Age: 49) pancreatic  Pancreatic cancer     Sister (Deceased)   Sister Cancer - Other (Age: 39)   Pancreatic cancer     Brother (Alive)   Brother Metallurgist)   Brother Heart attack (Age: 67)     Brother Cancer - Colon (Age: 18)     Maternal Grandmother (Deceased)   Maternal Grandfather (Deceased)   Paternal Grandmother (Deceased)   Paternal Grandfather (Deceased)   Son (Deceased at age 74)     Social History:  no tobacco, no ETOH  Labs:  Lipid Panel     Component Value Date/Time   CHOL 196 04/15/2019 0830   TRIG 61 04/15/2019 0830   HDL 69 04/15/2019 0830   CHOLHDL 2.8 04/15/2019 0830   LDLCALC 116 (H) 04/15/2019 0830   LABVLDL 11 04/15/2019 0830    Past Medical History:  Diagnosis Date   Arthritis  hands (12/20/2015)   Basal cell carcinoma, face    Coronary artery disease    a. 2003 - light MI per patient; s/p Cypher DES to prox LAD in [facility].   COVID 06/28/2020   cough, headache fever x 2 weeks all symptoms resolved, took monoclonal antibodies infusion   History of kidney stones 1975, 2021   passed them 1975,  2021 sx done   History of migraine    none since mi in 2000   Hx of adenomatous colonic polyps    Hyperlipemia    Hyperlipidemia    Hypertension    Melanoma of right side of neck (HCC)    removed posterior neck [id number]/312013   Myocardial infarction (HCC) 2000   Pre-diabetes    checks cbg q month   Prostate cancer (HCC)     Wears dentures    upper-lower partial   Wears glasses     Current Outpatient Medications on File Prior to Visit  Medication Sig Dispense Refill   aspirin  81 MG tablet Take 81 mg by mouth daily.     carvedilol  (COREG ) 12.5 MG tablet Take 12.5 mg by mouth at bedtime.     clopidogrel  (PLAVIX ) 75 MG tablet Take 75 mg by mouth at bedtime.      co-enzyme Q-10 50 MG capsule Take 50 mg by mouth daily.     Enalapril -Hydrochlorothiazide  5-12.5 MG tablet Take 1 tablet by mouth daily.     nitroGLYCERIN  (NITROSTAT ) 0.4 MG SL tablet Place 1 tablet (0.4 mg total) under the tongue every 5 (five) minutes as needed for chest pain (up to 3 doses). 25 tablet 3   tamsulosin (FLOMAX) 0.4 MG CAPS capsule Take 0.4 mg by mouth daily.     No current facility-administered medications on file prior to visit.    Allergies  Allergen Reactions   Shrimp [Shellfish Allergy] Nausea And Vomiting    Assessment/Plan:  1. Hyperlipidemia -  Problem  Hyperlipidemia   Current Medications: none Intolerances: ezetimibe , atorvastatin , simvastatin , rosuvastatin ,pravastatin  Nexlizet (nausea/headache, weakness) Risk Factors: progressive ASCVD, age, HTN, LDL-C >190 LDL-C goal: <55 (at least <29) ApoB goal:     Hyperlipidemia Assessment and plan:  LDLc >190 indicates possible FH, intolerance to most statins and Nexlizet  Stays active around the house and follows fairly healthy diet  Discussed injectables PCSK9i (self injections) vs. Leqvio  Patient has an upcoming appointment with Dr. Jordan on November 24 to seek a second opinion regarding starting  of Repatha .     Thank you,  Kevin Stewart, Pharm.D [place] Kevin Stewart. [facility] [address], [place], [place] [address] Phone: [phone]; Fax: [phone]

## 2024-06-02 ENCOUNTER — Ambulatory Visit: Admitting: Emergency Medicine

## 2024-06-07 NOTE — Progress Notes (Signed)
 Cardiology Office Note   Date:  06/14/2024   ID:  Kevin Stewart, DOB 01/17/1941, MRN [id number]  PCP:  Stephanie Charlene LITTIE, MD  Cardiologist:   Keir Foland, MD   Chief Complaint  Patient presents with   Coronary Artery Disease       History of Present Illness: Kevin Stewart is a 83 y.o. male who is seen for follow up CAD.  He presented in 2003 with a light heart attack and had stenting of the proximal LAD with a 3.5 x 18 mm Cypher stent in [facility]. He has been on chronic ASA and Plavix .  He has risk factors of HTN, hypercholesterolemia, and family history of CAD.   In the spring of 2017 he presented with exertional angina.  He had an ETT that was abnormal and underwent cardiac cath on 12/20/15 demonstrating a 99% proximal LAD stenosis proximal to the prior stent. This was treated with DES. He had a follow up  ETT in June 2018 which showed a marked improvement.   He has a history of intolerance to statins and has seen our Pharm D to consider starting Repatha .  Was seen this fall with an episode of chest pain while out trimming with a pole saw and got really hot. Follow up Myoview  study in October 2025 was normal.   On follow up today he is doing well. He has rare chest discomfort. Some dyspnea when he first starts walking uphill but this resolves. This has been stable for 20 years. He walks a lot [place] property.   No palpitations. Was seen by our pharmacist and is OK with trying Repatha .    Past Medical History:  Diagnosis Date   Arthritis    hands (12/20/2015)   Basal cell carcinoma, face    Coronary artery disease    a. 2003 - light MI per patient; s/p Cypher DES to prox LAD in [facility].   COVID 06/28/2020   cough, headache fever x 2 weeks all symptoms resolved, took monoclonal antibodies infusion   History of kidney stones 1975, 2021   passed them 1975,  2021 sx done   History of migraine    none since mi in 2000   Hx of adenomatous colonic  polyps    Hyperlipemia    Hyperlipidemia    Hypertension    Melanoma of right side of neck (HCC)    removed posterior neck 12/312013   Myocardial infarction (HCC) 2000   Pre-diabetes    checks cbg q month   Prostate cancer (HCC)    Wears dentures    upper-lower partial   Wears glasses     Past Surgical History:  Procedure Laterality Date   CARDIAC CATHETERIZATION N/A 12/20/2015   Procedure: Left Heart Cath and Coronary Angiography;  Surgeon: Victory LELON Sharps, MD;  Location: [facility] INVASIVE CV LAB;  Service: Cardiovascular;  Laterality: N/A;   CARDIAC CATHETERIZATION N/A 12/20/2015   Procedure: Coronary Stent Intervention;  Surgeon: Victory LELON Sharps, MD;  Location: [facility] INVASIVE CV LAB;  Service: Cardiovascular;  Laterality: N/A;   CATARACT EXTRACTION W/ INTRAOCULAR LENS  IMPLANT, BILATERAL Bilateral 2011   CIRCUMCISION  age 74   COLONOSCOPY  2012   CORONARY ANGIOPLASTY WITH STENT PLACEMENT  2000   1 stent   CORONARY ANGIOPLASTY WITH STENT PLACEMENT  12/20/2015   1 stent   CYSTOSCOPY  09/19/2020   Procedure: CYSTOSCOPY FLEXIBLE;  Surgeon: Selma Donnice SAUNDERS, MD;  Location: [facility];  Service: Urology;;  no seeds found in bladder   CYSTOSCOPY/URETEROSCOPY/HOLMIUM LASER/STENT PLACEMENT Right 08/03/2019   Procedure: CYSTOSCOPY/RETROGRADE/URETEROSCOPY//STENT PLACEMENT;  Surgeon: Devere Lonni Righter, MD;  Location: [facility];  Service: Urology;  Laterality: Right;   HEMORRHOID SURGERY     burned them off   MELANOMA EXCISION WITH SENTINEL LYMPH NODE BIOPSY  07/21/2012   Procedure: MELANOMA EXCISION WITH SENTINEL LYMPH NODE BIOPSY;  Surgeon: Lonni FORBES Angle, MD;  Location: [facility];  Service: General;  Laterality: N/A;  melanoma excision with sentinel node biopsy   posterior right neck   PROSTATE BIOPSY  06/2020   RADIOACTIVE SEED IMPLANT N/A 09/19/2020   Procedure: RADIOACTIVE SEED IMPLANT/BRACHYTHERAPY IMPLANT;  Surgeon: Selma Donnice SAUNDERS, MD;   Location: [facility];  Service: Urology;  Laterality: N/A;   52  seeds implanted   rotator cuff surgery  08/2019   [facility]   SPACE OAR INSTILLATION N/A 09/19/2020   Procedure: SPACE OAR INSTILLATION;  Surgeon: Selma Donnice SAUNDERS, MD;  Location: [facility];  Service: Urology;  Laterality: N/A;   TONSILLECTOMY  1961     Current Outpatient Medications  Medication Sig Dispense Refill   aspirin  81 MG tablet Take 81 mg by mouth daily.     carvedilol  (COREG ) 12.5 MG tablet Take 12.5 mg by mouth at bedtime.     clopidogrel  (PLAVIX ) 75 MG tablet Take 75 mg by mouth at bedtime.      co-enzyme Q-10 50 MG capsule Take 50 mg by mouth daily.     Enalapril -Hydrochlorothiazide  5-12.5 MG tablet Take 1 tablet by mouth daily.     nitroGLYCERIN  (NITROSTAT ) 0.4 MG SL tablet Place 1 tablet (0.4 mg total) under the tongue every 5 (five) minutes as needed for chest pain (up to 3 doses). 25 tablet 3   tamsulosin (FLOMAX) 0.4 MG CAPS capsule Take 0.4 mg by mouth daily.     No current facility-administered medications for this visit.    Allergies:   Shrimp [shellfish allergy]    Social History:  The patient  reports that he quit smoking about 50 years ago. His smoking use included cigarettes. He started smoking about 60 years ago. He has a 20 pack-year smoking history. He has quit using smokeless tobacco.  His smokeless tobacco use included chew. He reports that he does not currently use alcohol. He reports that he does not use drugs.   Family History:  The patient's family history includes Cancer - Colon (age of onset: 57) in his brother; Cancer - Other (age of onset: 26) in his father; Cancer - Other (age of onset: 34) in his sister; Heart attack (age of onset: 40) in his mother; Heart attack (age of onset: 55) in his brother; Pancreatic cancer in his father and sister.    ROS:  Please see the history of present illness.     All other systems are reviewed and negative.     PHYSICAL EXAM: VS:  BP 128/78   Pulse 76   Ht 5' 10 (1.778 m)   Wt 171 lb 9.6 oz (77.8 kg)   SpO2 97%   BMI 24.62 kg/m  , BMI Body mass index is 24.62 kg/m.       Lab Results  Component Value Date   WBC 6.1 04/26/2024   HGB 14.6 04/26/2024   HCT 43.2 04/26/2024   PLT 213 04/26/2024   GLUCOSE 92 04/26/2024   CHOL 196 04/15/2019   TRIG  61 04/15/2019   HDL 69 04/15/2019   LDLCALC 116 (H) 04/15/2019   ALT 19 09/15/2020   AST 20 09/15/2020   NA 135 04/26/2024   K 4.2 04/26/2024   CL 96 04/26/2024   CREATININE 0.76 04/26/2024   BUN 10 04/26/2024   CO2 25 04/26/2024   INR 1.1 09/15/2020    Labs dated 06/06/16: cholesterol 181, triglycerides 48, HDL 62, LDL 109. A1c 5.5%. CMET normal. Dated 06/26/17: cholesterol 250, triglycerides 60, HDL 59, LDL 179. CMET normal.  Dated 12/24/17: cholesterol 172, triglycerides 55, HDL 60, LDL 101. Glucose 111. Otherwise CMET normal.  Dated 07/29/19: cholesterol 196, triglycerides 88, HDL 60, LDL 120. Glucose 124 otherwise CBC and CMET normal.  Dated 03/01/21: cholesterol 191, triglycerides 81, HDL 57, LDL 119. Otherwise CMET and CBC normal Dated 04/15/24: cholesterol 282, triglycerides 83, HDL 60, LDL 208, A1c 5.6%. CMET and CBC normal.   Wt Readings from Last 3 Encounters:  06/14/24 171 lb 9.6 oz (77.8 kg)  04/28/24 168 lb (76.2 kg)  04/26/24 168 lb (76.2 kg)      Other studies Reviewed: Additional studies/ records that were reviewed today include: Cardiac cath 12/20/15: Review of the above records demonstrates:  Conclusion   Prox RCA to Dist RCA lesion, 30% stenosed. Mid LAD lesion, 20% stenosed. The lesion was previously treated with a stent (unknown type). Prox LAD to Mid LAD lesion, 90% stenosed. Post intervention, there is a 10% residual stenosis.   90% stenosis proximal to the previously placed LAD stent within the heavily calcified segment. Dominant right coronary which wraps around the left ventricular apex. Minimal  luminal irregularities are noted. Patent circumflex and left main. Successful percutaneous intervention with [place] Ball[place] angioplasty of the proximal stenosis followed by drug-eluting stent implantation reducing the 90% stenosis to 10% with TIMI grade 3 flow. Postdilatation was to 3.75 mm at 16 atm. Normal left ventricular systolic function with normal end-diastolic pressure.   Recommendations:   Continue Plavix  and aspirin  Discharge tomorrow if no complications overnight.     ETT 12/25/16: Study Highlights     Blood pressure demonstrated a hypertensive response to exercise. Horizontal ST segment depression ST segment depression of 1 mm was noted during stress in the II, III, aVF, V5 and V6 leads, and returning to baseline after less than 1 minute of recovery.   1. Excellent exercise tolerance.  2. Hypertensive BP response.  3. Equivocal changes on exercise ECG: about 1 mm horizontal ST depression in inferior leads and V5,V6 though difficult to tell for sure due to baseline artifact.  ST segments returned to baseline quickly in recovery.    Overall low risk study with excellent exercise tolerance but not normal.   Study Highlights Show Result Comparison    The study is normal. The study is low risk.   No ST deviation was noted.   LV perfusion is normal. There is no evidence of ischemia. There is no evidence of infarction.   Left ventricular function is normal. Nuclear stress EF: 63%. The left ventricular ejection fraction is normal (55-65%). End diastolic cavity size is normal. End systolic cavity size is normal. No evidence of transient ischemic dilation (TID) noted.   CT images were obtained for attenuation correction and were examined for the presence of coronary calcium  when appropriate.   Coronary calcium  assessment not performed due to prior revascularization.   Prior study not available for comparison.   Myoview  04/28/24: Study Highlights Show Result Comparison    The study  is  normal. The study is low risk.   No ST deviation was noted.   LV perfusion is normal. There is no evidence of ischemia. There is no evidence of infarction.   Left ventricular function is normal. Nuclear stress EF: 63%. The left ventricular ejection fraction is normal (55-65%). End diastolic cavity size is normal. End systolic cavity size is normal. No evidence of transient ischemic dilation (TID) noted.   CT images were obtained for attenuation correction and were examined for the presence of coronary calcium  when appropriate.   Coronary calcium  assessment not performed due to prior revascularization.   Prior study not available for comparison.  ASSESSMENT AND PLAN:  1.  Coronary artery disease- native vessel with stable angina class 1 symptoms. Remote stenting of the LAD in 2003 with a Cypher stent. S/p stenting of the proximal LAD in May 2017 with DES.  Now on DAPT with ASA and Plavix . On beta blocker and ACEi. Plan to continue Plavix  indefinitely with first generation DES. Follow up Myoview  study in October was normal.   2. Hypercholesterolemia. Intolerant of Vytorin  and lipitor. Willing to try Repatha - will initiate   3. HTN controlled   4. Prostate CA s/p radioactive seed implant  Current medicines are reviewed at length with the patient today.  The patient does not have concerns regarding medicines.  The following changes have been made:  no change  Labs/ tests ordered today include:   No orders of the defined types were placed in this encounter.     Disposition:   Follow up in 6 months.  Signed, Alyxandria Wentz, MD  06/14/2024 8:32 AM    [facility] [address], [place], [place], [address] Phone [phone], Fax [phone]

## 2024-06-14 ENCOUNTER — Other Ambulatory Visit (HOSPITAL_COMMUNITY): Payer: Self-pay

## 2024-06-14 ENCOUNTER — Telehealth: Payer: Self-pay

## 2024-06-14 ENCOUNTER — Encounter: Payer: Self-pay | Admitting: Cardiology

## 2024-06-14 ENCOUNTER — Ambulatory Visit: Attending: Cardiology | Admitting: Cardiology

## 2024-06-14 VITALS — BP 128/78 | HR 76 | Ht 70.0 in | Wt 171.6 lb

## 2024-06-14 DIAGNOSIS — E7800 Pure hypercholesterolemia, unspecified: Secondary | ICD-10-CM | POA: Diagnosis not present

## 2024-06-14 DIAGNOSIS — I25118 Atherosclerotic heart disease of native coronary artery with other forms of angina pectoris: Secondary | ICD-10-CM | POA: Diagnosis not present

## 2024-06-14 DIAGNOSIS — I10 Essential (primary) hypertension: Secondary | ICD-10-CM

## 2024-06-14 DIAGNOSIS — E7849 Other hyperlipidemia: Secondary | ICD-10-CM

## 2024-06-14 NOTE — Telephone Encounter (Signed)
-----   Message from Lonni Bolk sent at 06/14/2024  9:38 AM EST ----- Please complete PA for Repatha  ----- Message ----- From: Christianne Channing Vicci JONETTA, LPN Sent: [id number]   8:42 AM EST To: Cv Div Pharmd  Patient had appointment with Dr.Jordan this morning.Pt is ready to start Repatha  .

## 2024-06-14 NOTE — Telephone Encounter (Signed)
-----   Message from Nurse Channing SQUIBB sent at 06/14/2024  8:41 AM EST ----- Patient had appointment with Dr.Jordan this morning.Pt is ready to start Repatha  .

## 2024-06-14 NOTE — Telephone Encounter (Signed)
 Pharmacy Patient Advocate Encounter   Received notification from Physician's Office that prior authorization for REPATHA  is required/requested.   Insurance verification completed.   The patient is insured through [facility].   Per test claim: PA required; PA submitted to above mentioned insurance via Latent Key/confirmation #/EOC [id number] Status is pending

## 2024-06-14 NOTE — Patient Instructions (Addendum)
 Medication Instructions:  Continue same medications *If you need a refill on your cardiac medications before your next appointment, please call your pharmacy*  Lab Work: None ordered  Testing/Procedures: None ordered  Follow-Up: At [facility], you and your health needs are our priority.  As part of our continuing mission to provide you with exceptional heart care, our providers are all part of one team.  This team includes your primary Cardiologist (physician) and Advanced Practice Providers or APPs (Physician Assistants and Nurse Practitioners) who all work together to provide you with the care you need, when you need it.  Your next appointment:  6 months    Call in Feb to schedule May appointment     Provider:  Dr.Jordan        Pharmacist will send in prescription for Repatha     We recommend signing up for the patient portal called MyChart.  Sign up information is provided on this After Visit Summary.  MyChart is used to connect with patients for Virtual Visits (Telemedicine).  Patients are able to view lab/test results, encounter notes, upcoming appointments, etc.  Non-urgent messages can be sent to your provider as well.   To learn more about what you can do with MyChart, go to [email or web address].

## 2024-06-16 ENCOUNTER — Other Ambulatory Visit (HOSPITAL_COMMUNITY): Payer: Self-pay

## 2024-06-16 NOTE — Telephone Encounter (Signed)
 Pharmacy Patient Advocate Encounter  Received notification from [employer] that Prior Authorization for REPATHA  has been APPROVED from 06/16/24 to 12/13/24. Ran test claim, Copay is $269.11 (DEDUCTIBLE APPLIED TO CLAIM). This test claim was processed through [facility]- copay amounts may vary at other pharmacies due to pharmacy/plan contracts, or as the patient moves through the different stages of their insurance plan.

## 2024-06-21 ENCOUNTER — Telehealth: Payer: Self-pay | Admitting: Pharmacy Technician

## 2024-06-21 ENCOUNTER — Other Ambulatory Visit (HOSPITAL_COMMUNITY): Payer: Self-pay

## 2024-06-21 MED ORDER — REPATHA SURECLICK 140 MG/ML ~~LOC~~ SOAJ: 140.0000 mg | SUBCUTANEOUS | 3 refills | Status: AC

## 2024-06-21 NOTE — Telephone Encounter (Signed)
 Under diagnosis verification- please wait 24-48 hours before processing

## 2024-06-21 NOTE — Addendum Note (Signed)
 Addended by: Jonathen Rathman K on: 06/21/2024 08:14 AM   Modules accepted: Orders

## 2024-06-21 NOTE — Telephone Encounter (Signed)
 Pt made aware of PA approval. Will enroll him in grant to get Repatha  affordable. Follow up lipid lab is due on October 11, 2024

## 2024-06-21 NOTE — Telephone Encounter (Signed)
 Patient Advocate Encounter   The patient was approved for a [employer] grant that will help cover the cost of Repatha  Total amount awarded, 2500.00.  Effective: 05/22/24 - 05/21/25   APW:[id number] ERW:[id number] Hmnle:[id number] PI:[id number]  [employer] ID: [id number]   Pharmacy provided with approval and processing information. Patient informed via mychart

## 2024-06-22 ENCOUNTER — Other Ambulatory Visit (HOSPITAL_COMMUNITY): Payer: Self-pay

## 2024-06-22 NOTE — Telephone Encounter (Signed)
 Sent to pharmacy

## 2024-06-23 NOTE — Telephone Encounter (Signed)
 Still pending under diagnosis verification

## 2024-06-24 NOTE — Telephone Encounter (Signed)
 Patient Advocate Encounter   The patient was approved for a [employer] grant that will help cover the cost of repatha   Total amount awarded, 2500.  Effective: 05/22/24 - 05/21/25   APW:[id number] ERW:[id number] Hmnle:[id number] PI:[id number] [employer] ID: [id number]   Pharmacy provided with approval and processing information.    Now passed. I called [facility] -and now free

## 2024-07-05 NOTE — Telephone Encounter (Signed)
 Called patient to inform him that the grant has been approved and the Repatha  prescription was sent to the pharmacy. Patient stated he picked up the medication two weeks ago and has experienced mild headaches, but nothing severe. He plans to try another injection and will discontinue and notify us  if side effects become intolerable. Reminded patient of lipid panel in 3 months. Labs ordered and printed for patient.

## 2024-07-09 ENCOUNTER — Telehealth: Payer: Self-pay | Admitting: Cardiology

## 2024-07-09 NOTE — Telephone Encounter (Signed)
 Pt c/o medication issue:  1. Name of Medication:     Evolocumab  (REPATHA  SURECLICK) 140 MG/ML SOAJ   2. How are you currently taking this medication (dosage and times per day)?   As prescribed  3. Are you having a reaction (difficulty breathing--STAT)?   Face flushing, tremors, weakness in legs  4. What is your medication issue?   Patient stated he is stopping this medication due to the side-effects and wants a call back regarding next steps.

## 2024-07-09 NOTE — Telephone Encounter (Signed)
 Spoke with patient regarding Repatha  and side effects. Pt stating after he started this medication he began having flushing in the face, weakness in BLE, nausea and headache. Pt also stated he wasn't taking next dose and wants to switch medication. Will send to pharmacy and Dr. Jordan for further recommendations. Pt verbalizes understanding of plan.

## 2024-07-12 NOTE — Telephone Encounter (Signed)
 Left voice message to call back 12/22

## 2024-07-14 NOTE — Telephone Encounter (Signed)
 Left a message to call back.

## 2024-07-14 NOTE — Telephone Encounter (Signed)
 Spoke with patient and he reports that he will think about the Leqvio, but at this time he is going to stop the use of Repatha .

## 2024-07-26 NOTE — Telephone Encounter (Signed)
 Spoke to patient he stated he stopped taking Repatha .Stated he had nausea and headache. Stated he feels better and does not want to take anything else at this time.He will keep appointments as planned.

## 2024-12-21 ENCOUNTER — Ambulatory Visit: Admitting: Cardiology
